# Patient Record
Sex: Male | Born: 1945
Health system: Southern US, Community
[De-identification: ages and names within clinical notes are randomized; demographics above are authoritative.]

## PROBLEM LIST (undated history)

## (undated) DIAGNOSIS — R51 Headache: Secondary | ICD-10-CM

## (undated) DIAGNOSIS — F329 Major depressive disorder, single episode, unspecified: Secondary | ICD-10-CM

## (undated) DIAGNOSIS — M1990 Unspecified osteoarthritis, unspecified site: Secondary | ICD-10-CM

## (undated) DIAGNOSIS — E119 Type 2 diabetes mellitus without complications: Secondary | ICD-10-CM

## (undated) DIAGNOSIS — C3490 Malignant neoplasm of unspecified part of unspecified bronchus or lung: Secondary | ICD-10-CM

## (undated) DIAGNOSIS — R5383 Other fatigue: Secondary | ICD-10-CM

## (undated) DIAGNOSIS — E039 Hypothyroidism, unspecified: Secondary | ICD-10-CM

## (undated) DIAGNOSIS — F32A Depression, unspecified: Secondary | ICD-10-CM

## (undated) DIAGNOSIS — R0683 Snoring: Secondary | ICD-10-CM

## (undated) DIAGNOSIS — I10 Essential (primary) hypertension: Secondary | ICD-10-CM

## (undated) DIAGNOSIS — E785 Hyperlipidemia, unspecified: Secondary | ICD-10-CM

## (undated) HISTORY — DX: Major depressive disorder, single episode, unspecified: F32.9

## (undated) HISTORY — DX: Depression, unspecified: F32.A

## (undated) HISTORY — DX: Essential (primary) hypertension: I10

## (undated) HISTORY — DX: Other fatigue: R53.83

## (undated) HISTORY — DX: Snoring: R06.83

## (undated) HISTORY — PX: KNEE ARTHROSCOPY: SHX127

## (undated) HISTORY — PX: THYROID SURGERY: SHX805

---

## 1998-01-23 ENCOUNTER — Ambulatory Visit (HOSPITAL_COMMUNITY): Admission: RE | Admit: 1998-01-23 | Discharge: 1998-01-23 | Payer: Self-pay | Admitting: Thoracic Surgery

## 1998-06-26 ENCOUNTER — Ambulatory Visit (HOSPITAL_COMMUNITY): Admission: RE | Admit: 1998-06-26 | Discharge: 1998-06-26 | Payer: Self-pay | Admitting: Thoracic Surgery

## 1999-02-28 ENCOUNTER — Encounter: Payer: Self-pay | Admitting: Thoracic Surgery

## 1999-03-01 ENCOUNTER — Encounter (INDEPENDENT_AMBULATORY_CARE_PROVIDER_SITE_OTHER): Payer: Self-pay | Admitting: *Deleted

## 1999-03-01 ENCOUNTER — Ambulatory Visit (HOSPITAL_COMMUNITY): Admission: RE | Admit: 1999-03-01 | Discharge: 1999-03-01 | Payer: Self-pay | Admitting: Thoracic Surgery

## 1999-05-22 ENCOUNTER — Encounter: Payer: Self-pay | Admitting: Thoracic Surgery

## 1999-05-22 ENCOUNTER — Encounter: Admission: RE | Admit: 1999-05-22 | Discharge: 1999-05-22 | Payer: Self-pay | Admitting: Thoracic Surgery

## 1999-12-04 ENCOUNTER — Encounter: Admission: RE | Admit: 1999-12-04 | Discharge: 1999-12-04 | Payer: Self-pay | Admitting: Thoracic Surgery

## 1999-12-04 ENCOUNTER — Encounter: Payer: Self-pay | Admitting: Thoracic Surgery

## 2000-06-09 ENCOUNTER — Encounter: Admission: RE | Admit: 2000-06-09 | Discharge: 2000-06-09 | Payer: Self-pay | Admitting: Thoracic Surgery

## 2000-06-09 ENCOUNTER — Encounter: Payer: Self-pay | Admitting: Thoracic Surgery

## 2000-12-09 ENCOUNTER — Encounter: Payer: Self-pay | Admitting: Thoracic Surgery

## 2000-12-09 ENCOUNTER — Encounter: Admission: RE | Admit: 2000-12-09 | Discharge: 2000-12-09 | Payer: Self-pay | Admitting: Thoracic Surgery

## 2001-06-29 ENCOUNTER — Other Ambulatory Visit: Admission: RE | Admit: 2001-06-29 | Discharge: 2001-06-29 | Payer: Self-pay | Admitting: General Surgery

## 2001-12-08 ENCOUNTER — Encounter: Payer: Self-pay | Admitting: Thoracic Surgery

## 2001-12-08 ENCOUNTER — Encounter: Admission: RE | Admit: 2001-12-08 | Discharge: 2001-12-08 | Payer: Self-pay | Admitting: Thoracic Surgery

## 2002-01-31 ENCOUNTER — Ambulatory Visit (HOSPITAL_COMMUNITY): Admission: RE | Admit: 2002-01-31 | Discharge: 2002-01-31 | Payer: Self-pay | Admitting: General Surgery

## 2002-06-10 ENCOUNTER — Encounter: Admission: RE | Admit: 2002-06-10 | Discharge: 2002-06-10 | Payer: Self-pay | Admitting: Thoracic Surgery

## 2002-06-10 ENCOUNTER — Encounter: Payer: Self-pay | Admitting: Thoracic Surgery

## 2002-12-08 ENCOUNTER — Encounter: Payer: Self-pay | Admitting: Thoracic Surgery

## 2002-12-08 ENCOUNTER — Encounter: Admission: RE | Admit: 2002-12-08 | Discharge: 2002-12-08 | Payer: Self-pay | Admitting: Thoracic Surgery

## 2003-01-23 ENCOUNTER — Ambulatory Visit (HOSPITAL_COMMUNITY): Admission: RE | Admit: 2003-01-23 | Discharge: 2003-01-23 | Payer: Self-pay | Admitting: Family Medicine

## 2003-01-23 ENCOUNTER — Encounter: Payer: Self-pay | Admitting: Family Medicine

## 2003-12-13 ENCOUNTER — Encounter: Admission: RE | Admit: 2003-12-13 | Discharge: 2003-12-13 | Payer: Self-pay | Admitting: Thoracic Surgery

## 2004-07-09 ENCOUNTER — Ambulatory Visit (HOSPITAL_COMMUNITY): Admission: RE | Admit: 2004-07-09 | Discharge: 2004-07-09 | Payer: Self-pay | Admitting: Orthopaedic Surgery

## 2007-02-16 ENCOUNTER — Ambulatory Visit (HOSPITAL_COMMUNITY): Admission: RE | Admit: 2007-02-16 | Discharge: 2007-02-16 | Payer: Self-pay | Admitting: Family Medicine

## 2007-03-15 ENCOUNTER — Ambulatory Visit (HOSPITAL_COMMUNITY): Admission: RE | Admit: 2007-03-15 | Discharge: 2007-03-15 | Payer: Self-pay | Admitting: Family Medicine

## 2007-03-17 ENCOUNTER — Ambulatory Visit: Payer: Self-pay | Admitting: Thoracic Surgery

## 2008-02-29 ENCOUNTER — Ambulatory Visit (HOSPITAL_COMMUNITY): Admission: RE | Admit: 2008-02-29 | Discharge: 2008-02-29 | Payer: Self-pay | Admitting: Family Medicine

## 2009-04-05 ENCOUNTER — Ambulatory Visit (HOSPITAL_COMMUNITY): Admission: RE | Admit: 2009-04-05 | Discharge: 2009-04-05 | Payer: Self-pay | Admitting: Family Medicine

## 2010-05-18 ENCOUNTER — Encounter: Payer: Self-pay | Admitting: Family Medicine

## 2010-09-10 NOTE — Letter (Signed)
March 17, 2007   Patrica Duel, M.D.  [address], [address]  [place], [place] [address]   Re:  FAUSTO, SAMPEDRO                 DOB:  12-24-1945   Dear Loraine Leriche:   I saw this patient back in today and reviewed his CT scan.  There really  is not much change in what we found several years ago with scarring in  the right upper lobe particularly along the fissures and the left  lingula.  I think this all are chronic situations that he has from  chronic scarring and we followed him for several years and did a  bronchoscopy and did not find anything.  I will refer him back to you  for long-term followup.  He did complain and then showed me a small area  in his left upper quadrant that I think is a small lipoma.  He has had  some recent bronchitis recently on his chest x-ray.  His lungs are clear  to auscultation and percussion.  Blood pressure is 117/78, pulse 86,  respirations 18, saturation 96%.   Sincerely,   Ines Bloomer, M.D.  Electronically Signed   DPB/MEDQ  D:  03/17/2007  T:  03/18/2007  Job:  [id number]

## 2010-09-13 NOTE — H&P (Signed)
   NAME:  Billy Hall, Billy Hall NO.:  [id number]   MEDICAL RECORD NO.:  [id number]                  PATIENT TYPE:   LOCATION:                                       FACILITY:   PHYSICIAN:  Dalia Heading, M.D.               DATE OF BIRTH:  08-17-1945   DATE OF ADMISSION:  DATE OF DISCHARGE:                                HISTORY & PHYSICAL   CHIEF COMPLAINT:  Need for screening colonoscopy.   HISTORY OF PRESENT ILLNESS:  The patient is a 65 year old black male who was  referred for a screening colonoscopy.  He denies any abdominal complaints.  He has never had a colonoscopy.  He denies any hemorrhoidal problems or  family history of colon carcinoma.   PAST MEDICAL HISTORY:  Hypothyroidism, gout, hypertension.   PAST SURGICAL HISTORY:  Thyroidectomy.   CURRENT MEDICATIONS:  1. Synthroid.  2. Adalat.  3. Allopurinol.   ALLERGIES:  ACE INHIBITORS.   REVIEW OF SYSTEMS:  Unremarkable.   PHYSICAL EXAMINATION:  GENERAL:  The patient is a well-developed, well-  nourished black male in no acute distress.  VITAL SIGNS:  He is afebrile and vital signs are stable.  LUNGS:  Clear to auscultation with equal breath sounds bilaterally.  HEART:  Regular rate and rhythm without S3, S4, or murmurs.  ABDOMEN:  Soft, nontender, nondistended.  No hepatosplenomegaly or masses  are noted.  RECTAL:  Examination was deferred to the procedure.   IMPRESSION:  Need for screening colonoscopy.    PLAN:  The patient is scheduled for a colonoscopy on January 31, 2002.  The  risks and benefits of the procedure including bleeding and perforation were  fully explained to the patient, gave informed consent.                                                  Dalia Heading, M.D.    MAJ/MEDQ  D:  01/27/2002  T:  01/28/2002  Job:  [id number]   cc:   Patrica Duel, MD  [address]  [place]  [place] [address]  Fax: [phone]

## 2010-09-13 NOTE — H&P (Signed)
NAME:  Billy Hall, Billy Hall           ACCOUNT NO.:  [id number]   MEDICAL RECORD NO.:  [id number]          PATIENT TYPE:  AMB   LOCATION:                                 FACILITY:   PHYSICIAN:  J. Darreld Mclean, M.D. DATE OF BIRTH:  23-Apr-1946   DATE OF ADMISSION:  07/09/2004  DATE OF DISCHARGE:  LH                                HISTORY & PHYSICAL   CHIEF COMPLAINT:  Right knee pain.   HISTORY OF PRESENT ILLNESS:  The patient is a 65 year old African American  male who has been admitted through the day hospital on July 09, 2004, to  undergo right knee arthroscopy and meniscectomy.   The patient was initially seen in the office on June 18, 2004,  complaining of right knee pain.  He gave a history of having pain to his  right knee for approximately two to three weeks.  He says that it has gotten  progressively worse.  There was no given way or locking of the knee, just  pain in the medial aspect itself.   He has seen Dr. Sherwood Gambler and was placed on Lodine 400 mg q.i.d.  This did not  seem to help with the knee pain.  Also, he was given IM Decadron injection,  which has not helped with his knee pain.  He is currently taking glucosamine  chondroitin for his knee pain.  He does have a history of gouty arthritis.  He states he does take his allopurinol faithfully.  Examination of his right  knee at that time revealed -5 to 90 degrees of flexion.  He had swelling in  the posterior aspect of the knee.  He had a negative anterior drawer sign.  No medial or collateral ligament instability noted.  He did have pain with  stressing the knee laterally along the medial joint line.   Previous x-rays taken of his knee a year ago showed significant degenerative  joint disease primarily in the medial compartment.  These were reviewed  again.  It was felt that the patient had flare up of his arthritis.  He was  then given an injection which consisted of Xylocaine, Marcaine and Depo-  Medrol and asked  to follow up in approximately two weeks.  He did so.  He  returned and stated that the injection helped for a brief period of time,  but the pain returned to the medial aspect.  He has a history of giving way  of the knee and limping.  At that time, the patient was set up to undergo an  MRI of his right knee.  He did so on June 29, 2004, and it showed an oblique  inferior surface tear of the posterior horn of the medial meniscus and  significant degenerative joint disease of the medial compartment of the  right knee.   The patient returns to the office today.  These results were explained to  him using the knee model.  Recommendations were that of right knee  arthroscopy and meniscectomy.  The surgery was described in detail per Dr.  Brooke Dare and myself.  The patient information booklet  was given.  The patient  decided to go ahead with his surgery as he was not getting any better with  conservative measures.  He understands even with the surgery that the  arthritic pain may not all be relieved.   He understands he will need a short course of physical therapy following  this procedure.   PAST MEDICAL HISTORY:  The patient has a history of:  1.  Hypertension.  2.  Gouty arthritis.  3.  Hypothyroidism.  4.  Degenerative joint disease of the right knee.   PAST SURGICAL HISTORY:  Partial thyroidectomy in 1989 by Barbaraann Barthel,  M.D., at [facility].   MEDICATIONS:  1.  Vicodin 5 mg one to two tablets q.4h. p.r.n. for pain.  2.  Aleve two tablets b.i.d. p.c.  3.  Synthroid 50 mcg one tablet daily.  4.  Adalat 30 mg one tablet daily.  5.  Allopurinol 300 mg one tablet daily.  6.  Aspirin 81 mg one tablet daily.   ALLERGIES:  ACE BLOCKERS.   LOCAL MEDICAL DOCTOR:  Patrica Duel, M.D.   SOCIAL HISTORY:  The patient is married.  He does not use alcohol or tobacco  products.  He works for [employer] in [place], [place].   FAMILY HISTORY:   Hypertension and alcoholism run in his family.   REVIEW OF SYSTEMS:  Positive for hypothyroidism, otherwise negative.   PHYSICAL EXAMINATION:  HEIGHT:  5 feet 8-1/2 inches tall.  WEIGHT:  252 pounds.  VITAL SIGNS:  Afebrile.  The pulse is 60, respirations 12 and blood pressure  130/80.  HEENT:  Within normal limits.  NECK:  Supple.  No thyromegaly or masses palpated.  LUNGS:  Clear to A&P.  HEART:  Regular rhythm without murmur.  No cardiomegaly.  ABDOMEN:  Protuberant, obese, soft and nontender.  No organomegaly or masses  palpated.  Hyperactive bowel sounds auscultated in all four quadrants.  EXTREMITIES:  Right lower extremity range of motion is -5 to 90 degrees of  flexion.  He has mild swelling in the posterior aspect of the knee and  tenderness to the medial joint line.  There is no medial or lateral  collateral ligament instability noted.  Negative anterior drawer sign noted.  Neurovascular was intact.  Other extremities within normal limits.  Neurovascular intact to them.  CENTRAL NERVOUS SYSTEM:  Intact.  SKIN:  Intact.   IMPRESSION:  1.  Torn medial meniscus of right knee.  2.  Degenerative joint disease of right knee.  3.  History of hypertension.  4.  History of hypothyroidism.  5.  History of gouty arthritis.   PLAN:  The patient is to be admitted to the day hospital on July 09, 2004,  to undergo right knee arthroscopy and meniscectomy.  Laboratories are  pending.      BC/MEDQ  D:  07/03/2004  T:  07/03/2004  Job:  [id number]   cc:   [facility]  Fax: [phone]

## 2010-09-13 NOTE — Op Note (Signed)
NAME:  Billy Hall, Billy Hall           ACCOUNT NO.:  [id number]   MEDICAL RECORD NO.:  [id number]          PATIENT TYPE:  AMB   LOCATION:  DAY                           FACILITY:  [facility]   PHYSICIAN:  J. Darreld Mclean, M.D. DATE OF BIRTH:  1945/11/30   DATE OF PROCEDURE:  07/09/2004  DATE OF DISCHARGE:                                 OPERATIVE REPORT   PREOPERATIVE DIAGNOSIS:  Tear, medial meniscus, right knee.   POSTOPERATIVE DIAGNOSIS:  Tear, medial meniscus, right knee.   PROCEDURE:  Operative arthroscopy, partial medial meniscectomy of the right  knee.   ANESTHESIA:  General.   SURGEON:  J. Darreld Mclean, M.D.   ASSISTANT:  Lolita Cram, P.A.-C.   TOURNIQUET TIME:  16 minutes.   DRAINS:  No drains.   INDICATIONS:  Patient is a 65 year old male with pain and tenderness in the  right knee. MRI shows tear of the medial meniscus and degenerative joint  disease. Surgery has been recommended because he has not improved with  conservative treatment. He has had giving way and locking. Risks and  imponderables have been discussed preoperatively.   OPERATIVE FINDINGS:  Suprapatellar pouch with some mild synovitis. Medially,  there was grade 2 to 3 changes of the femoral and articular surfaces. There  was a chronic tear of the posterior horn of the medial meniscus stellate.  Anterior cruciate was intact with some strands distally. Laterally, the  articular surface looked good, grade 2 changes. Meniscus was normal except  for some slight fraying. No loose bodies.   DESCRIPTION OF PROCEDURE:  The patient was seen in the holding area. The  right knee was identified at the correct surgical site. He placed his  initials on it, and so did I. He was taken back to the operating room, given  general anesthetic. Tourniquet and leg holder placed, deflated, right upper  leg. We reidentified the patient and we were doing the right knee for  arthroscopy of medial meniscus. After being prepped and  draped, inflow  cannula inserted medially. Lactated ringers instilled in the knee by an  infusion pump. Arthroscope was inserted laterally and knee systematically  examined. Findings above. Pictures were taken. Using a meniscal shaver and a  meniscal punch, the meniscus was smoothed, and a good contour was obtained.  There was no apparent loose bodies. Partial medial meniscectomy was carried  out. Knee was reexamined. No new pathology found. Wounds were irrigated with  remaining part of lactated ringers. Wounds were approximated using 3-0 nylon  interrupted vertical mattress manner.  Marcaine 0.25% instilled in each portal. Tourniquet deflated at 16 minutes.  Sterile dressing applied. Bulky dressing applied. He was placed in a knee  mobilizer. Prescription for Vicodin ES given for pain. I will see him in the  office in approximately 10 days to 2 weeks. For any difficulty, contact me  through the office hospital beeper system.      JWK/MEDQ  D:  07/09/2004  T:  07/09/2004  Job:  [id number]

## 2012-04-01 ENCOUNTER — Other Ambulatory Visit (HOSPITAL_COMMUNITY): Payer: Self-pay | Admitting: Family Medicine

## 2012-04-01 DIAGNOSIS — E119 Type 2 diabetes mellitus without complications: Secondary | ICD-10-CM

## 2012-04-01 DIAGNOSIS — E785 Hyperlipidemia, unspecified: Secondary | ICD-10-CM

## 2012-04-01 DIAGNOSIS — Z139 Encounter for screening, unspecified: Secondary | ICD-10-CM

## 2012-04-01 DIAGNOSIS — I10 Essential (primary) hypertension: Secondary | ICD-10-CM

## 2012-04-05 ENCOUNTER — Ambulatory Visit (HOSPITAL_COMMUNITY): Payer: Self-pay

## 2012-04-05 ENCOUNTER — Ambulatory Visit (HOSPITAL_COMMUNITY)
Admission: RE | Admit: 2012-04-05 | Discharge: 2012-04-05 | Disposition: A | Discharge status: Home / Self Care | Payer: Medicare Other | Source: Ambulatory Visit | Attending: Family Medicine | Admitting: Family Medicine

## 2012-04-05 DIAGNOSIS — I10 Essential (primary) hypertension: Secondary | ICD-10-CM

## 2012-04-05 DIAGNOSIS — Z139 Encounter for screening, unspecified: Secondary | ICD-10-CM

## 2012-04-05 DIAGNOSIS — E119 Type 2 diabetes mellitus without complications: Secondary | ICD-10-CM

## 2012-04-05 DIAGNOSIS — E785 Hyperlipidemia, unspecified: Secondary | ICD-10-CM

## 2012-04-05 DIAGNOSIS — Z1389 Encounter for screening for other disorder: Secondary | ICD-10-CM | POA: Insufficient documentation

## 2013-02-24 NOTE — H&P (Signed)
NTS SOAP Note  Vital Signs:  Vitals as of: 02/24/2013: Systolic 146: Diastolic 86: Heart Rate 81: Temp 97.18F: Height 20ft 9in: Weight 274Lbs 0 Ounces: BMI 40.46  BMI : 40.46 kg/m2  Subjective: This 67 Years 60 Months old Male presents for scheduling of screening TCS.  Denies any gi commplaints.  No family h/o colon carcinoma.  Last had a TCS over ten years ago.   Review of Symptoms:  Constitutional:  fatigue Head:unremarkable    Eyes:unremarkable   sinus problems Cardiovascular:  unremarkable   Respiratory:  dyspnea Gastrointestinal:  unremarkable   Genitourinary:    frequency   joint and back pain dry Hematolgic/Lymphatic:unremarkable       hay fever   Past Medical History:    Reviewed   Past Medical History  Surgical History: thyroidectomy Medical Problems: NIDDM, hypoithyroidism, HTN Allergies: ace inhibitors Medications: metformin, losartin, synthroid, cialis, simvastatin, tamsulosin, baby asa   Social History:Reviewed  Social History  Preferred Language: English Race:  Black or African American Ethnicity: Not Hispanic / Latino Age: 67 Years 0 Months Marital Status:  M Alcohol:  No Recreational drug(s):  No   Smoking Status: Never smoker reviewed on 02/24/2013 Functional Status reviewed on mm/dd/yyyy ------------------------------------------------ Bathing: Normal Cooking: Normal Dressing: Normal Driving: Normal Eating: Normal Managing Meds: Normal Oral Care: Normal Shopping: Normal Toileting: Normal Transferring: Normal Walking: Normal Cognitive Status reviewed on mm/dd/yyyy ------------------------------------------------ Attention: Normal Decision Making: Normal Language: Normal Memory: Normal Motor: Normal Perception: Normal Problem Solving: Normal Visual and Spatial: Normal   Family History:  Reviewed  Family Health History Mother, Deceased; Diabetes mellitus, unspecified type;  Father,  Deceased; History Unknown    Objective Information: General:  Well appearing, well nourished in no distress. Heart:  RRR, no murmur Lungs:    CTA bilaterally, no wheezes, rhonchi, rales.  Breathing unlabored. Abdomen:Soft, NT/ND, no HSM, no masses.   deferred to procedure  Assessment:Need for screening TCS  Diagnosis &amp; Procedure Smart Code   Plan:Scheduled for TCS on 03/08/13.   Patient Education:Alternative treatments to surgery were discussed with patient (and family).  Risks and benefits  of procedure including bleeding and perforatiion were fully explained to the patient (and family) who gave informed consent. Patient/family questions were addressed.  Follow-up:Pending Surgery

## 2013-02-28 ENCOUNTER — Encounter (HOSPITAL_COMMUNITY): Payer: Self-pay | Admitting: Pharmacy Technician

## 2013-03-08 ENCOUNTER — Encounter (HOSPITAL_COMMUNITY): Payer: Self-pay | Admitting: *Deleted

## 2013-03-08 ENCOUNTER — Ambulatory Visit (HOSPITAL_COMMUNITY)
Admission: RE | Admit: 2013-03-08 | Discharge: 2013-03-08 | Disposition: A | Discharge status: Home / Self Care | Payer: Medicare Other | Source: Ambulatory Visit | Attending: General Surgery | Admitting: General Surgery

## 2013-03-08 ENCOUNTER — Encounter (HOSPITAL_COMMUNITY)
Admission: RE | Disposition: A | Discharge status: Home / Self Care | Payer: Self-pay | Source: Ambulatory Visit | Attending: General Surgery

## 2013-03-08 DIAGNOSIS — Z1211 Encounter for screening for malignant neoplasm of colon: Secondary | ICD-10-CM | POA: Insufficient documentation

## 2013-03-08 DIAGNOSIS — Z01812 Encounter for preprocedural laboratory examination: Secondary | ICD-10-CM | POA: Insufficient documentation

## 2013-03-08 DIAGNOSIS — E119 Type 2 diabetes mellitus without complications: Secondary | ICD-10-CM | POA: Insufficient documentation

## 2013-03-08 DIAGNOSIS — K5730 Diverticulosis of large intestine without perforation or abscess without bleeding: Secondary | ICD-10-CM | POA: Insufficient documentation

## 2013-03-08 DIAGNOSIS — I10 Essential (primary) hypertension: Secondary | ICD-10-CM | POA: Insufficient documentation

## 2013-03-08 HISTORY — PX: COLONOSCOPY: SHX5424

## 2013-03-08 HISTORY — DX: Hyperlipidemia, unspecified: E78.5

## 2013-03-08 HISTORY — DX: Headache: R51

## 2013-03-08 HISTORY — DX: Hypothyroidism, unspecified: E03.9

## 2013-03-08 HISTORY — DX: Type 2 diabetes mellitus without complications: E11.9

## 2013-03-08 LAB — GLUCOSE, CAPILLARY: Glucose-Capillary: 93 mg/dL (ref 70–99)

## 2013-03-08 SURGERY — COLONOSCOPY
Anesthesia: Moderate Sedation

## 2013-03-08 MED ORDER — MEPERIDINE HCL 50 MG/ML IJ SOLN
INTRAMUSCULAR | Status: DC | PRN
  Administered 2013-03-08: 50 mg via INTRAVENOUS

## 2013-03-08 MED ORDER — MIDAZOLAM HCL 5 MG/5ML IJ SOLN
INTRAMUSCULAR | Status: AC
  Filled 2013-03-08: qty 5

## 2013-03-08 MED ORDER — STERILE WATER FOR IRRIGATION IR SOLN
Status: DC | PRN
  Administered 2013-03-08: 09:00:00

## 2013-03-08 MED ORDER — SODIUM CHLORIDE 0.9 % IV SOLN
INTRAVENOUS | Status: DC
  Administered 2013-03-08: 1000 mL via INTRAVENOUS

## 2013-03-08 MED ORDER — MEPERIDINE HCL 50 MG/ML IJ SOLN
INTRAMUSCULAR | Status: AC
  Filled 2013-03-08: qty 1

## 2013-03-08 MED ORDER — MIDAZOLAM HCL 5 MG/5ML IJ SOLN
INTRAMUSCULAR | Status: DC | PRN
  Administered 2013-03-08: 4 mg via INTRAVENOUS

## 2013-03-08 NOTE — Op Note (Signed)
[facility] [address] [place], [address]   COLONOSCOPY PROCEDURE REPORT  PATIENT: Billy, Hall  MR#: [id number] BIRTHDATE: [birth year] , 67  yrs. old GENDER: Male ENDOSCOPIST: [name], MD REFERRED GN:FAOZHYQ, John PROCEDURE DATE:  03/08/2013 PROCEDURE:   Colonoscopy, screening ASA CLASS:   Class II INDICATIONS:Average risk patient for colon cancer. MEDICATIONS: Versed 4 mg IV and Demerol 50 mg IV  DESCRIPTION OF PROCEDURE:   After the risks benefits and alternatives of the procedure were thoroughly explained, informed consent was obtained.  A digital rectal exam revealed no abnormalities of the rectum.   The EC-[id number] ([id number])  endoscope was introduced through the anus and advanced to the cecum, which was identified by both the appendix and ileocecal valve. No adverse events experienced.   The quality of the prep was adequate, using MoviPrep  The instrument was then slowly withdrawn as the colon was fully examined.      COLON FINDINGS: Moderate diverticulosis was noted in the descending colon.   The colon mucosa was otherwise normal.  Retroflexed views revealed no abnormalities. The time to cecum=5 minutes 0 seconds. Withdrawal time=4 minutes 0 seconds.  The scope was withdrawn and the procedure completed. COMPLICATIONS: There were no complications.  ENDOSCOPIC IMPRESSION: Normal colon  RECOMMENDATIONS: Repeat Colonscopy in 10 years.   eSigned:  [name], MD 03/08/2013 9:39 AM   cc:

## 2013-03-08 NOTE — Interval H&P Note (Signed)
History and Physical Interval Note:  03/08/2013 9:17 AM  Billy Hall  has presented today for surgery, with the diagnosis of screening  The various methods of treatment have been discussed with the patient and family. After consideration of risks, benefits and other options for treatment, the patient has consented to  Procedure(s): COLONOSCOPY (N/A) as a surgical intervention .  The patient's history has been reviewed, patient examined, no change in status, stable for surgery.  I have reviewed the patient's chart and labs.  Questions were answered to the patient's satisfaction.     [name] A

## 2013-03-11 ENCOUNTER — Encounter (HOSPITAL_COMMUNITY): Payer: Self-pay | Admitting: General Surgery

## 2014-05-08 DIAGNOSIS — Z6841 Body Mass Index (BMI) 40.0 and over, adult: Secondary | ICD-10-CM | POA: Diagnosis not present

## 2014-05-08 DIAGNOSIS — E063 Autoimmune thyroiditis: Secondary | ICD-10-CM | POA: Diagnosis not present

## 2014-06-27 DIAGNOSIS — E119 Type 2 diabetes mellitus without complications: Secondary | ICD-10-CM | POA: Diagnosis not present

## 2014-06-27 DIAGNOSIS — Z6841 Body Mass Index (BMI) 40.0 and over, adult: Secondary | ICD-10-CM | POA: Diagnosis not present

## 2014-06-27 DIAGNOSIS — I10 Essential (primary) hypertension: Secondary | ICD-10-CM | POA: Diagnosis not present

## 2014-06-27 DIAGNOSIS — E782 Mixed hyperlipidemia: Secondary | ICD-10-CM | POA: Diagnosis not present

## 2014-08-03 DIAGNOSIS — R5383 Other fatigue: Secondary | ICD-10-CM | POA: Diagnosis not present

## 2014-08-03 DIAGNOSIS — Z6838 Body mass index (BMI) 38.0-38.9, adult: Secondary | ICD-10-CM | POA: Diagnosis not present

## 2014-08-03 DIAGNOSIS — F329 Major depressive disorder, single episode, unspecified: Secondary | ICD-10-CM | POA: Diagnosis not present

## 2014-08-03 DIAGNOSIS — R0683 Snoring: Secondary | ICD-10-CM | POA: Diagnosis not present

## 2014-08-22 ENCOUNTER — Ambulatory Visit (INDEPENDENT_AMBULATORY_CARE_PROVIDER_SITE_OTHER): Payer: Medicare Other | Admitting: Neurology

## 2014-08-22 ENCOUNTER — Encounter: Payer: Self-pay | Admitting: Neurology

## 2014-08-22 VITALS — BP 142/80 | HR 88 | Resp 18 | Ht 69.0 in | Wt 258.0 lb

## 2014-08-22 DIAGNOSIS — R400 Somnolence: Secondary | ICD-10-CM

## 2014-08-22 DIAGNOSIS — Z9889 Other specified postprocedural states: Secondary | ICD-10-CM | POA: Diagnosis not present

## 2014-08-22 DIAGNOSIS — E669 Obesity, unspecified: Secondary | ICD-10-CM

## 2014-08-22 DIAGNOSIS — M25561 Pain in right knee: Secondary | ICD-10-CM | POA: Diagnosis not present

## 2014-08-22 DIAGNOSIS — G478 Other sleep disorders: Secondary | ICD-10-CM

## 2014-08-22 DIAGNOSIS — G4710 Hypersomnia, unspecified: Secondary | ICD-10-CM | POA: Diagnosis not present

## 2014-08-22 DIAGNOSIS — G4761 Periodic limb movement disorder: Secondary | ICD-10-CM

## 2014-08-22 DIAGNOSIS — M25562 Pain in left knee: Secondary | ICD-10-CM

## 2014-08-22 DIAGNOSIS — R0683 Snoring: Secondary | ICD-10-CM | POA: Diagnosis not present

## 2014-08-22 DIAGNOSIS — G4700 Insomnia, unspecified: Secondary | ICD-10-CM

## 2014-08-22 NOTE — Progress Notes (Addendum)
Subjective:    Patient ID: Billy Hall is a 69 y.o. male.  HPI     Star Age, MD, PhD [facility] [address] [place], [place] [address]  Dear Dr. Hilma Favors,   I saw your patient, Billy Hall, upon your kind request in my neurologic clinic today for initial consultation of his sleep disorder, concern for underlying obstructive sleep apnea. The patient is unaccompanied today. As you know, Billy Hall is a 69 year old right-handed gentleman with an underlying medical history of depression, hyperlipidemia, obesity, diabetes, arthritis, hypo-thyroidism, who reports excessive daytime somnolence and snoring.  I reviewed your office note from 08/03/14, which you kindly included. He had blood work, including, CBC with diff, CMP, testosterone level and TSH. We will request results from your office.   His typical bedtime is reported to be around 10:30 to 11 PM. While he falls asleep fairly quickly, he does not stay asleep while. He usually wakes up around 3:30 AM almost consistently and has trouble going back to sleep. He may doze off a little bit and finally gets out of bed around 6 or 7 AM typically. He does not wake up rested. He snores which is mild to moderate according to his wife. He does not wake up with a gasping sensation. She denies morning headaches. He may get up to use the bathroom once. He has bilateral knee pain. He had knee surgery to his anterior cruciate ligament on the right but still has pain, right more than left. He has also been told by his wife that he twitches his legs and looks like he is running in his sleep at times. He is not aware of this. He does wake up with his legs aching. He reports no family history of obstructive sleep apnea. He is not very keen on staying for sleep study because he makes sure that his wife takes her medications at night. Nevertheless, he does endorse daytime somnolence, nonrestorative sleep,  lack of energy during the day. His Epworth sleepiness score is 6 out of 24 today and his fatigue scores 23. He is retired. 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 He drinks 1 cup of coffee and one glass of green tea per day typically. He quit smoking in 2004. He does not drink alcohol. He may sleep talk some but denies any other parasomnias.    His Past Medical History Is Significant For: Past Medical History  Diagnosis Date  . Diabetes mellitus without complication   . Hyperlipidemia   . Hypothyroidism   . Headache(784.0)   . Fatigue   . Snoring   . Depressive disorder   . Hypertension     His Past Surgical History Is Significant For: Past Surgical History  Procedure Laterality Date  . Thyroid surgery    . Colonoscopy N/A 03/08/2013    Procedure: COLONOSCOPY;  Surgeon: Jamesetta So, MD;  Location: [facility];  Service: Gastroenterology;  Laterality: N/A;    His Family History Is Significant For: Family History  Problem Relation Age of Onset  . Alzheimer's disease Mother     His Social History Is Significant For: History   Social History  . Marital Status: Married    Spouse Name: N/A  . Number of Children: 1  . Years of Education: High Schol   Occupational History  . Retired    Social History Main Topics  . Smoking status: Former Research scientist (life sciences)  . Smokeless tobacco: Not on file  Comment: Quit 2004  . Alcohol Use: 0.0 oz/week    0 Standard drinks or equivalent per week     Comment: Wekends  . Drug Use: No  . Sexual Activity: Not on file   Other Topics Concern  . None   Social History Narrative   I cup of coffee a day, drinks some Green tea    His Allergies Are:  Allergies  Allergen Reactions  . Ace Inhibitors   :   His Current Medications Are:  Outpatient Encounter Prescriptions as of 08/22/2014  Medication Sig  . aspirin EC 81 MG tablet Take 81 mg by mouth daily.  Marland Kitchen escitalopram (LEXAPRO) 10 MG tablet Take 10 mg by mouth daily.  Marland Kitchen  levothyroxine (SYNTHROID, LEVOTHROID) 75 MCG tablet Take 75 mcg by mouth daily before breakfast.  . losartan (COZAAR) 100 MG tablet Take 50 mg by mouth daily.  . meloxicam (MOBIC) 15 MG tablet Take 15 mg by mouth daily.  . metFORMIN (GLUCOPHAGE) 1000 MG tablet Take 1,000 mg by mouth 2 (two) times daily with a meal.  . simvastatin (ZOCOR) 40 MG tablet Take 20 mg by mouth every evening.  . tadalafil (CIALIS) 5 MG tablet Take 10 mg by mouth daily as needed for erectile dysfunction.  . tamsulosin (FLOMAX) 0.4 MG CAPS capsule Take 0.4 mg by mouth daily.  :  Review of Systems:  Out of a complete 14 point review of systems, all are reviewed and negative with the exception of these symptoms as listed below:   Review of Systems  Constitutional: Positive for fatigue.  Musculoskeletal:       Joint pain   Neurological:       Sleepiness, Reports having trouble staying asleep at night, Family reports snoring, Wakes up feeling tired in the morning.   Psychiatric/Behavioral:       Decreased energy     Objective:  Neurologic Exam  Physical Exam Physical Examination:   Filed Vitals:   08/22/14 0937  BP: 142/80  Pulse: 88  Resp: 18   General Examination: The patient is a very pleasant 69 y.o. male in no acute distress. He appears well-developed and well nourished. He is well groomed.   HEENT: Normocephalic, atraumatic, pupils are equal, round and reactive to light and accommodation. Funduscopic exam is normal with sharp disc margins noted. Extraocular tracking is good without limitation to gaze excursion or nystagmus noted. Normal smooth pursuit is noted. Hearing is grossly intact. Tympanic membranes are clear bilaterally. Face is symmetric with normal facial animation and normal facial sensation. Speech is clear with no dysarthria noted. There is no hypophonia. There is no lip, neck/head, jaw or voice tremor. Neck is supple with full range of passive and active motion. There are no carotid bruits  on auscultation. Oropharynx exam reveals: mild mouth dryness, adequate dental hygiene and moderate airway crowding, due to redundant soft palate with a wider uvula. His tonsils may be absent or small. Tongue is elongated. Mallampati is class II. Tongue protrudes centrally and palate elevates symmetrically. Neck size is 16.75 inches. He has dental implants in the front teeth above and below. He has some crowns.  Chest: Clear to auscultation without wheezing, rhonchi or crackles noted.  Heart: S1+S2+0, regular and normal without murmurs, rubs or gallops noted.   Abdomen: Soft, non-tender and non-distended with normal bowel sounds appreciated on auscultation.  Extremities: There is no pitting edema. He has bilateral nonpitting ankle puffiness. Pedal pulses are intact.  Skin: Warm and dry without trophic changes  noted. There are no varicose veins.  Musculoskeletal: exam reveals no obvious joint deformities, tenderness or joint swelling or erythema, except for bilateral knee pain and difficulty bending both knees. He has decreased range of motion bilaterally in the knees, right is worse than left.   Neurologically:  Mental status: The patient is awake, alert and oriented in all 4 spheres. His immediate and remote memory, attention, language skills and fund of knowledge are appropriate. There is no evidence of aphasia, agnosia, apraxia or anomia. Speech is clear with normal prosody and enunciation. Thought process is linear. Mood is normal and affect is normal.  Cranial nerves II - XII are as described above under HEENT exam. In addition: shoulder shrug is normal with equal shoulder height noted. Motor exam: Normal bulk, strength and tone is noted. There is no drift, tremor or rebound. Romberg is negative. Reflexes are 2+ throughout. Babinski: Toes are flexor bilaterally. Fine motor skills and coordination: intact with normal finger taps, normal hand movements, normal rapid alternating patting, normal  foot taps and normal foot agility.  Cerebellar testing: No dysmetria or intention tremor on finger to nose testing. Heel to shin is unremarkable bilaterally. There is no truncal or gait ataxia.  Sensory exam: intact to light touch, pinprick, vibration, temperature sense in the upper and lower extremities.  Gait, station and balance: He stands with difficulty, due to limitations in knee flexion bilaterally, right more than left. No veering to one side is noted. No leaning to one side is noted. Posture is age-appropriate and stance is narrow based. Gait shows slight limp on the right. He has trouble with tandem walk as he has difficulty putting weight on one leg at a time. No problems turning are noted. Balance seems preserved.   Assessment and Plan:  In summary, Billy Hall is a very pleasant 69 y.o.-year old male with an underlying medical history of depression, hyperlipidemia, obesity, diabetes, arthritis, hypo-thyroidism, who reports  snoring, nonrestorative sleep, early morning awakening with difficulty maintaining sleep and daytime tiredness. His history and physical exam are concerning for obstructive sleep apnea (OSA). He also reports bilateral knee pain. He reports twitching or vomiting movements in his sleep which could be in keeping with PLMS. I had a long chat with the patient about my findings and the diagnosis of OSA, its prognosis and treatment options. We talked about medical treatments, surgical interventions and non-pharmacological approaches. I explained in particular the risks and ramifications of untreated moderate to severe OSA, especially with respect to developing cardiovascular disease down the Road, including congestive heart failure, difficult to treat hypertension, cardiac arrhythmias, or stroke. Even type 2 diabetes has, in part, been linked to untreated OSA. Symptoms of untreated OSA include daytime sleepiness, memory problems, mood irritability and mood disorder such as  depression and anxiety, lack of energy, as well as recurrent headaches, especially morning headaches. We talked about trying to maintain a healthy lifestyle in general, as well as the importance of weight control. I encouraged the patient to eat healthy, exercise daily and keep well hydrated, to keep a scheduled bedtime and wake time routine, to not skip any meals and eat healthy snacks in between meals. I advised the patient not to drive when feeling sleepy. I recommended the following at this time: sleep study with potential positive airway pressure titration. (We will score hypopneas at 4% and split the sleep study into diagnostic and treatment portion, if the estimated. 2 hour AHI is >20/h).   I explained the sleep test  procedure to the patient and also outlined possible surgical and non-surgical treatment options of OSA, including the use of a custom-made dental device (which would require a referral to a specialist dentist or oral surgeon), upper airway surgical options, such as pillar implants, radiofrequency surgery, tongue base surgery, and UPPP (which would involve a referral to an ENT surgeon). Rarely, jaw surgery such as mandibular advancement may be considered.  I also explained the CPAP treatment option to the patient, who indicated that he would be willing to try CPAP if the need arises. I explained the importance of being compliant with PAP treatment, not only for insurance purposes but primarily to improve His symptoms, and for the patient's long term health benefit, including to reduce His cardiovascular risks. I answered all his questions today and the patient was in agreement. I would like to see him back after the sleep study is completed and encouraged him to call with any interim questions, concerns, problems or updates.   We also talked about PLMD and how it is linked to restless leg syndrome. We will revisit this after the sleep study. If he does have significant PLMD, disrupting his  sleep he may have to address this as well.  Thank you very much for allowing me to participate in the care of this nice patient. If I can be of any further assistance to you please do not hesitate to call me at [phone].  Sincerely,   Star Age, MD, PhD  08/22/2014: I received blood test results from 08/03/2014: CBC with differential was normal, CMP was normal with the exception of glucose at 125, testosterone in the normal range at 471, TSH normal at 2.98.

## 2014-08-22 NOTE — Patient Instructions (Signed)

## 2014-08-25 DIAGNOSIS — E063 Autoimmune thyroiditis: Secondary | ICD-10-CM | POA: Diagnosis not present

## 2014-08-25 DIAGNOSIS — Z6837 Body mass index (BMI) 37.0-37.9, adult: Secondary | ICD-10-CM | POA: Diagnosis not present

## 2014-08-27 ENCOUNTER — Ambulatory Visit (INDEPENDENT_AMBULATORY_CARE_PROVIDER_SITE_OTHER): Payer: Medicare Other | Admitting: Neurology

## 2014-08-27 DIAGNOSIS — G4710 Hypersomnia, unspecified: Secondary | ICD-10-CM

## 2014-08-27 DIAGNOSIS — G4733 Obstructive sleep apnea (adult) (pediatric): Secondary | ICD-10-CM

## 2014-08-27 DIAGNOSIS — G4734 Idiopathic sleep related nonobstructive alveolar hypoventilation: Secondary | ICD-10-CM

## 2014-08-27 DIAGNOSIS — G479 Sleep disorder, unspecified: Secondary | ICD-10-CM

## 2014-08-27 DIAGNOSIS — G4761 Periodic limb movement disorder: Secondary | ICD-10-CM

## 2014-08-27 NOTE — Sleep Study (Signed)
Please see the scanned sleep study interpretation located in the Procedure tab within the Chart Review section. 

## 2014-09-08 ENCOUNTER — Telehealth: Payer: Self-pay | Admitting: Neurology

## 2014-09-08 DIAGNOSIS — G4733 Obstructive sleep apnea (adult) (pediatric): Secondary | ICD-10-CM

## 2014-09-08 NOTE — Telephone Encounter (Signed)
Beverlee Nims:   Please call and notify the patient that the recent sleep study did confirm the diagnosis of obstructive sleep apnea and that I recommend treatment for this in the form of CPAP. This will require a repeat sleep study for proper titration and mask fitting. Please explain to patient and then Alycia can arrange for a CPAP titration study. I have placed an order in the chart. Thanks, Star Age, MD, PhD [facility])

## 2014-09-13 NOTE — Telephone Encounter (Signed)
I spoke to patient and gave him the results of his sleep study. He states that he does not want to proceed and he does not want an office visit to go over the PSG in more detail. I went over the PSG findings in detail with him over the phone. Patient was made aware of risks and benefits of CPAP treatment. He states that he understands but insists that he "is not having anymore sleeping trouble" and is "sleeping real good now". I again explained the findings of his oxygen desaturation and degree of apnea events. I offered a follow up appointment with Dr. Rexene Alberts several times but he continued to decline. He reports that he will call us back as needed. Sleep study was faxed to PCP.

## 2014-09-13 NOTE — Telephone Encounter (Signed)
I will try to call patient. I would like for him to come to an appt for FU at least.

## 2014-09-15 NOTE — Telephone Encounter (Signed)
Ok

## 2014-10-02 DIAGNOSIS — Z6838 Body mass index (BMI) 38.0-38.9, adult: Secondary | ICD-10-CM | POA: Diagnosis not present

## 2014-10-02 DIAGNOSIS — Z0000 Encounter for general adult medical examination without abnormal findings: Secondary | ICD-10-CM | POA: Diagnosis not present

## 2014-11-07 ENCOUNTER — Encounter: Payer: Self-pay | Admitting: Orthopedic Surgery

## 2014-11-07 ENCOUNTER — Other Ambulatory Visit: Payer: Self-pay | Admitting: Orthopedic Surgery

## 2014-11-07 ENCOUNTER — Ambulatory Visit (HOSPITAL_COMMUNITY)
Admission: RE | Admit: 2014-11-07 | Discharge: 2014-11-07 | Disposition: A | Discharge status: Home / Self Care | Payer: Medicare Other | Source: Ambulatory Visit | Attending: Orthopedic Surgery | Admitting: Orthopedic Surgery

## 2014-11-07 ENCOUNTER — Ambulatory Visit (INDEPENDENT_AMBULATORY_CARE_PROVIDER_SITE_OTHER): Payer: Medicare Other | Admitting: Orthopedic Surgery

## 2014-11-07 VITALS — BP 142/80 | Ht 69.0 in | Wt 269.0 lb

## 2014-11-07 DIAGNOSIS — M1711 Unilateral primary osteoarthritis, right knee: Secondary | ICD-10-CM | POA: Diagnosis not present

## 2014-11-07 DIAGNOSIS — M47816 Spondylosis without myelopathy or radiculopathy, lumbar region: Secondary | ICD-10-CM | POA: Diagnosis not present

## 2014-11-07 DIAGNOSIS — M1712 Unilateral primary osteoarthritis, left knee: Secondary | ICD-10-CM | POA: Diagnosis not present

## 2014-11-07 DIAGNOSIS — M25562 Pain in left knee: Secondary | ICD-10-CM

## 2014-11-07 DIAGNOSIS — M545 Low back pain: Secondary | ICD-10-CM | POA: Insufficient documentation

## 2014-11-07 DIAGNOSIS — M25561 Pain in right knee: Secondary | ICD-10-CM | POA: Diagnosis not present

## 2014-11-07 DIAGNOSIS — M549 Dorsalgia, unspecified: Secondary | ICD-10-CM

## 2014-11-07 NOTE — Patient Instructions (Signed)
Joint Injection  Care After  Refer to this sheet in the next few days. These instructions provide you with information on caring for yourself after you have had a joint injection. Your caregiver also may give you more specific instructions. Your treatment has been planned according to current medical practices, but problems sometimes occur. Call your caregiver if you have any problems or questions after your procedure.  After any type of joint injection, it is not uncommon to experience:  · Soreness, swelling, or bruising around the injection site.  · Mild numbness, tingling, or weakness around the injection site caused by the numbing medicine used before or with the injection.  It also is possible to experience the following effects associated with the specific agent after injection:  · Iodine-based contrast agents:  ¨ Allergic reaction (itching, hives, widespread redness, and swelling beyond the injection site).  · Corticosteroids (These effects are rare.):  ¨ Allergic reaction.  ¨ Increased blood sugar levels (If you have diabetes and you notice that your blood sugar levels have increased, notify your caregiver).  ¨ Increased blood pressure levels.  ¨ Mood swings.  · Hyaluronic acid in the use of viscosupplementation.  ¨ Temporary heat or redness.  ¨ Temporary rash and itching.  ¨ Increased fluid accumulation in the injected joint.  These effects all should resolve within a day after your procedure.   HOME CARE INSTRUCTIONS  · Limit yourself to light activity the day of your procedure. Avoid lifting heavy objects, bending, stooping, or twisting.  · Take prescription or over-the-counter pain medication as directed by your caregiver.  · You may apply ice to your injection site to reduce pain and swelling the day of your procedure. Ice may be applied 03-04 times:  ¨ Put ice in a plastic bag.  ¨ Place a towel between your skin and the bag.  ¨ Leave the ice on for no longer than 15-20 minutes each time.  SEEK  IMMEDIATE MEDICAL CARE IF:   · Pain and swelling get worse rather than better or extend beyond the injection site.  · Numbness does not go away.  · Blood or fluid continues to leak from the injection site.  · You have chest pain.  · You have swelling of your face or tongue.  · You have trouble breathing or you become dizzy.  · You develop a fever, chills, or severe tenderness at the injection site that last longer than 1 day.  MAKE SURE YOU:  · Understand these instructions.  · Watch your condition.  · Get help right away if you are not doing well or if you get worse.  Document Released: 12/26/2010 Document Revised: 07/07/2011 Document Reviewed: 12/26/2010  ExitCare® Patient Information ©2015 [employer]. This information is not intended to replace advice given to you by your health care provider. Make sure you discuss any questions you have with your health care provider.

## 2014-11-07 NOTE — Progress Notes (Signed)
Body mass index is 39.71 kg/(m^2).

## 2014-11-07 NOTE — Progress Notes (Signed)
Patient ID: Billy Hall, male   DOB: [birth year], 69 y.o.   MRN: [id number] Patient ID: Billy Hall, male   DOB: 03-Apr-1946, 69 y.o.   MRN: [id number] New   Chief Complaint  Patient presents with  . Knee Pain    bilateral knee pain, refer Hilma Favors     Billy Hall is a 69 y.o. male.   HPI 69 year old male has a 10 year history of pain in both knees right worse than left had right knee arthroscopy back in 2006 presents with pain swelling stiffness in both knees right worse than left. He also has pain from his hip to his ankle on the right side and back pain. He was treated with meloxicam on a when necessary basis but says that doesn't help him as much as Tylenol 650 area his pain is rated 7 out of 10 is worse with activity is worse at night is primarily on the medial side of both knees and its increased when he is walking. He does have some fairly significant medical problems which include diabetes and hypertension as well as thyroid disease but they are fairly well controlled with medicine  Night sweats fatigue shortness of breath cold and heat intolerance hayfever burning pain in his legs and tingling make up his review of systems along with his back pain stiff joints swollen joints muscle weakness and joint pain   Review of Systems See hpi  Past Medical History  Diagnosis Date  . Diabetes mellitus without complication   . Hyperlipidemia   . Hypothyroidism   . Headache(784.0)   . Fatigue   . Snoring   . Depressive disorder   . Hypertension     Past Surgical History  Procedure Laterality Date  . Thyroid surgery    . Colonoscopy N/A 03/08/2013    Procedure: COLONOSCOPY;  Surgeon: Jamesetta So, MD;  [facility]: [facility];  Service: Gastroenterology;  Laterality: N/A;    Family History  Problem Relation Age of Onset  . Alzheimer's disease Mother     Social History History  Substance Use Topics  . Smoking status: Former Research scientist (life sciences)  . Smokeless tobacco:  Not on file     Comment: Quit 2004  . Alcohol Use: 0.0 oz/week    0 Standard drinks or equivalent per week     Comment: Wekends    Allergies  Allergen Reactions  . Ace Inhibitors     Current Outpatient Prescriptions  Medication Sig Dispense Refill  . aspirin EC 81 MG tablet Take 81 mg by mouth daily.    Marland Kitchen levothyroxine (SYNTHROID, LEVOTHROID) 75 MCG tablet Take 75 mcg by mouth daily before breakfast.    . losartan (COZAAR) 100 MG tablet Take 50 mg by mouth daily.    . meloxicam (MOBIC) 15 MG tablet Take 15 mg by mouth daily.    . metFORMIN (GLUCOPHAGE) 1000 MG tablet Take 1,000 mg by mouth 2 (two) times daily with a meal.    . simvastatin (ZOCOR) 40 MG tablet Take 20 mg by mouth every evening.    . tamsulosin (FLOMAX) 0.4 MG CAPS capsule Take 0.4 mg by mouth daily.     No current facility-administered medications for this visit.       Physical Exam Blood pressure 142/80, height '5\' 9"'$  (1.753 m), weight 269 lb (122.018 kg). Physical Exam The patient is well developed well nourished and well groomed. Orientation to person place and time is normal  Mood is pleasant. Ambulatory status he is ambulatory  there is no assistive device at this time. Right knee flexion is limited at 90 if that. He has flexion contracture varus alignment but his knee is stable motor exam is normal scans intact sensations normal he has weak pulses on the dorsum of the feet. I would recommend an ABI prior to surgery  Left side same in terms of vascular exam  Sensation skin intact on the left motor exam intact in the left knee stable on the left knee flexion 110 on left medial joint line tenderness is still significant no effusion.    Data Reviewed X-rays were done but they had to be done at [facility] back film and bilateral knee films  Spondylosis lumbar spine  Severe arthritis bilateral knees  Assessment Encounter Diagnoses  Name Primary?  . Back pain with radiation Yes  . Primary osteoarthritis of  knee, right     Plan He basically needs bilateral knee replacements. His wife is requiring his full care and he cannot have the surgery. He asked about hyaluronic acid but in these settings that is not a productive option although if he wants that done I can send him to another doctor to get it as the [facility] system does not cover that with what Medicare reimbursement is  So we decided to inject both knees and he will call us if he needs further care.  Procedure note left knee injection verbal consent was obtained to inject left knee joint  Timeout was completed to confirm the site of injection  The medications used were 40 mg of Depo-Medrol and 1% lidocaine 3 cc  Anesthesia was provided by ethyl chloride and the skin was prepped with alcohol.  After cleaning the skin with alcohol a 20-gauge needle was used to inject the left knee joint. There were no complications. A sterile bandage was applied.   Procedure note right knee injection verbal consent was obtained to inject right knee joint  Timeout was completed to confirm the site of injection  The medications used were 40 mg of Depo-Medrol and 1% lidocaine 3 cc  Anesthesia was provided by ethyl chloride and the skin was prepped with alcohol.  After cleaning the skin with alcohol a 20-gauge needle was used to inject the right knee joint. There were no complications. A sterile bandage was applied.

## 2014-11-08 ENCOUNTER — Telehealth: Payer: Self-pay | Admitting: Orthopedic Surgery

## 2014-11-08 NOTE — Telephone Encounter (Signed)
Mr. Dispenza is calling stating that no one ever went over the xrays that were done at [facility] yesterday, he said he was just told that his knees were "shot" and he would like for someone to call him and explain to him those results, please advise?

## 2014-11-09 NOTE — Telephone Encounter (Signed)
Spoke with patient, gave results  

## 2014-12-06 DIAGNOSIS — E782 Mixed hyperlipidemia: Secondary | ICD-10-CM | POA: Diagnosis not present

## 2014-12-06 DIAGNOSIS — Z1389 Encounter for screening for other disorder: Secondary | ICD-10-CM | POA: Diagnosis not present

## 2014-12-06 DIAGNOSIS — E119 Type 2 diabetes mellitus without complications: Secondary | ICD-10-CM | POA: Diagnosis not present

## 2014-12-06 DIAGNOSIS — I10 Essential (primary) hypertension: Secondary | ICD-10-CM | POA: Diagnosis not present

## 2014-12-06 DIAGNOSIS — E039 Hypothyroidism, unspecified: Secondary | ICD-10-CM | POA: Diagnosis not present

## 2014-12-18 DIAGNOSIS — E782 Mixed hyperlipidemia: Secondary | ICD-10-CM | POA: Diagnosis not present

## 2014-12-18 DIAGNOSIS — Z6841 Body Mass Index (BMI) 40.0 and over, adult: Secondary | ICD-10-CM | POA: Diagnosis not present

## 2014-12-18 DIAGNOSIS — E119 Type 2 diabetes mellitus without complications: Secondary | ICD-10-CM | POA: Diagnosis not present

## 2014-12-18 DIAGNOSIS — Z1389 Encounter for screening for other disorder: Secondary | ICD-10-CM | POA: Diagnosis not present

## 2015-01-03 DIAGNOSIS — M170 Bilateral primary osteoarthritis of knee: Secondary | ICD-10-CM | POA: Diagnosis not present

## 2015-01-15 DIAGNOSIS — H43819 Vitreous degeneration, unspecified eye: Secondary | ICD-10-CM | POA: Diagnosis not present

## 2015-01-15 DIAGNOSIS — E119 Type 2 diabetes mellitus without complications: Secondary | ICD-10-CM | POA: Diagnosis not present

## 2015-01-15 DIAGNOSIS — H52223 Regular astigmatism, bilateral: Secondary | ICD-10-CM | POA: Diagnosis not present

## 2015-01-15 DIAGNOSIS — H524 Presbyopia: Secondary | ICD-10-CM | POA: Diagnosis not present

## 2015-01-15 DIAGNOSIS — H5203 Hypermetropia, bilateral: Secondary | ICD-10-CM | POA: Diagnosis not present

## 2015-01-17 DIAGNOSIS — M1712 Unilateral primary osteoarthritis, left knee: Secondary | ICD-10-CM | POA: Diagnosis not present

## 2015-01-17 DIAGNOSIS — M1711 Unilateral primary osteoarthritis, right knee: Secondary | ICD-10-CM | POA: Diagnosis not present

## 2015-01-24 DIAGNOSIS — M170 Bilateral primary osteoarthritis of knee: Secondary | ICD-10-CM | POA: Diagnosis not present

## 2015-01-24 DIAGNOSIS — M1711 Unilateral primary osteoarthritis, right knee: Secondary | ICD-10-CM | POA: Diagnosis not present

## 2015-01-24 DIAGNOSIS — M1712 Unilateral primary osteoarthritis, left knee: Secondary | ICD-10-CM | POA: Diagnosis not present

## 2015-01-31 DIAGNOSIS — M1711 Unilateral primary osteoarthritis, right knee: Secondary | ICD-10-CM | POA: Diagnosis not present

## 2015-01-31 DIAGNOSIS — M170 Bilateral primary osteoarthritis of knee: Secondary | ICD-10-CM | POA: Diagnosis not present

## 2015-01-31 DIAGNOSIS — M1712 Unilateral primary osteoarthritis, left knee: Secondary | ICD-10-CM | POA: Diagnosis not present

## 2015-03-09 DIAGNOSIS — Z1389 Encounter for screening for other disorder: Secondary | ICD-10-CM | POA: Diagnosis not present

## 2015-03-09 DIAGNOSIS — I10 Essential (primary) hypertension: Secondary | ICD-10-CM | POA: Diagnosis not present

## 2015-03-09 DIAGNOSIS — E119 Type 2 diabetes mellitus without complications: Secondary | ICD-10-CM | POA: Diagnosis not present

## 2015-03-09 DIAGNOSIS — E782 Mixed hyperlipidemia: Secondary | ICD-10-CM | POA: Diagnosis not present

## 2015-03-09 DIAGNOSIS — Z6841 Body Mass Index (BMI) 40.0 and over, adult: Secondary | ICD-10-CM | POA: Diagnosis not present

## 2015-03-09 DIAGNOSIS — Z23 Encounter for immunization: Secondary | ICD-10-CM | POA: Diagnosis not present

## 2015-03-14 DIAGNOSIS — M170 Bilateral primary osteoarthritis of knee: Secondary | ICD-10-CM | POA: Diagnosis not present

## 2015-07-12 DIAGNOSIS — E119 Type 2 diabetes mellitus without complications: Secondary | ICD-10-CM | POA: Diagnosis not present

## 2015-07-12 DIAGNOSIS — E039 Hypothyroidism, unspecified: Secondary | ICD-10-CM | POA: Diagnosis not present

## 2015-07-12 DIAGNOSIS — Z1389 Encounter for screening for other disorder: Secondary | ICD-10-CM | POA: Diagnosis not present

## 2015-07-12 DIAGNOSIS — I10 Essential (primary) hypertension: Secondary | ICD-10-CM | POA: Diagnosis not present

## 2015-08-31 DIAGNOSIS — M1711 Unilateral primary osteoarthritis, right knee: Secondary | ICD-10-CM | POA: Diagnosis not present

## 2015-08-31 DIAGNOSIS — M1712 Unilateral primary osteoarthritis, left knee: Secondary | ICD-10-CM | POA: Diagnosis not present

## 2015-08-31 DIAGNOSIS — M170 Bilateral primary osteoarthritis of knee: Secondary | ICD-10-CM | POA: Diagnosis not present

## 2015-10-15 DIAGNOSIS — E782 Mixed hyperlipidemia: Secondary | ICD-10-CM | POA: Diagnosis not present

## 2015-10-15 DIAGNOSIS — Z1389 Encounter for screening for other disorder: Secondary | ICD-10-CM | POA: Diagnosis not present

## 2015-10-15 DIAGNOSIS — I10 Essential (primary) hypertension: Secondary | ICD-10-CM | POA: Diagnosis not present

## 2015-10-15 DIAGNOSIS — E1165 Type 2 diabetes mellitus with hyperglycemia: Secondary | ICD-10-CM | POA: Diagnosis not present

## 2016-01-16 DIAGNOSIS — H524 Presbyopia: Secondary | ICD-10-CM | POA: Diagnosis not present

## 2016-01-16 DIAGNOSIS — H5203 Hypermetropia, bilateral: Secondary | ICD-10-CM | POA: Diagnosis not present

## 2016-01-16 DIAGNOSIS — E119 Type 2 diabetes mellitus without complications: Secondary | ICD-10-CM | POA: Diagnosis not present

## 2016-01-16 DIAGNOSIS — H52223 Regular astigmatism, bilateral: Secondary | ICD-10-CM | POA: Diagnosis not present

## 2016-02-07 DIAGNOSIS — E119 Type 2 diabetes mellitus without complications: Secondary | ICD-10-CM | POA: Diagnosis not present

## 2016-02-07 DIAGNOSIS — I10 Essential (primary) hypertension: Secondary | ICD-10-CM | POA: Diagnosis not present

## 2016-02-07 DIAGNOSIS — E782 Mixed hyperlipidemia: Secondary | ICD-10-CM | POA: Diagnosis not present

## 2016-02-07 DIAGNOSIS — Z0001 Encounter for general adult medical examination with abnormal findings: Secondary | ICD-10-CM | POA: Diagnosis not present

## 2016-02-07 DIAGNOSIS — R5383 Other fatigue: Secondary | ICD-10-CM | POA: Diagnosis not present

## 2016-02-07 DIAGNOSIS — J449 Chronic obstructive pulmonary disease, unspecified: Secondary | ICD-10-CM | POA: Diagnosis not present

## 2016-02-08 DIAGNOSIS — N400 Enlarged prostate without lower urinary tract symptoms: Secondary | ICD-10-CM | POA: Diagnosis not present

## 2016-02-08 DIAGNOSIS — Z1389 Encounter for screening for other disorder: Secondary | ICD-10-CM | POA: Diagnosis not present

## 2016-02-08 DIAGNOSIS — Z0001 Encounter for general adult medical examination with abnormal findings: Secondary | ICD-10-CM | POA: Diagnosis not present

## 2016-02-08 DIAGNOSIS — J439 Emphysema, unspecified: Secondary | ICD-10-CM | POA: Diagnosis not present

## 2016-02-12 DIAGNOSIS — Z23 Encounter for immunization: Secondary | ICD-10-CM | POA: Diagnosis not present

## 2016-06-09 DIAGNOSIS — Z1389 Encounter for screening for other disorder: Secondary | ICD-10-CM | POA: Diagnosis not present

## 2016-06-09 DIAGNOSIS — Z0000 Encounter for general adult medical examination without abnormal findings: Secondary | ICD-10-CM | POA: Diagnosis not present

## 2016-06-09 DIAGNOSIS — J439 Emphysema, unspecified: Secondary | ICD-10-CM | POA: Diagnosis not present

## 2016-06-09 DIAGNOSIS — E782 Mixed hyperlipidemia: Secondary | ICD-10-CM | POA: Diagnosis not present

## 2016-06-09 DIAGNOSIS — E119 Type 2 diabetes mellitus without complications: Secondary | ICD-10-CM | POA: Diagnosis not present

## 2016-06-09 DIAGNOSIS — E039 Hypothyroidism, unspecified: Secondary | ICD-10-CM | POA: Diagnosis not present

## 2016-06-09 DIAGNOSIS — N400 Enlarged prostate without lower urinary tract symptoms: Secondary | ICD-10-CM | POA: Diagnosis not present

## 2016-06-09 DIAGNOSIS — J449 Chronic obstructive pulmonary disease, unspecified: Secondary | ICD-10-CM | POA: Diagnosis not present

## 2016-09-16 DIAGNOSIS — I10 Essential (primary) hypertension: Secondary | ICD-10-CM | POA: Diagnosis not present

## 2016-09-16 DIAGNOSIS — E1165 Type 2 diabetes mellitus with hyperglycemia: Secondary | ICD-10-CM | POA: Diagnosis not present

## 2016-09-16 DIAGNOSIS — E782 Mixed hyperlipidemia: Secondary | ICD-10-CM | POA: Diagnosis not present

## 2016-09-16 DIAGNOSIS — Z1389 Encounter for screening for other disorder: Secondary | ICD-10-CM | POA: Diagnosis not present

## 2016-09-16 DIAGNOSIS — E039 Hypothyroidism, unspecified: Secondary | ICD-10-CM | POA: Diagnosis not present

## 2016-09-16 DIAGNOSIS — J449 Chronic obstructive pulmonary disease, unspecified: Secondary | ICD-10-CM | POA: Diagnosis not present

## 2016-10-23 DIAGNOSIS — N481 Balanitis: Secondary | ICD-10-CM | POA: Diagnosis not present

## 2016-10-23 DIAGNOSIS — E119 Type 2 diabetes mellitus without complications: Secondary | ICD-10-CM | POA: Diagnosis not present

## 2016-10-23 DIAGNOSIS — E063 Autoimmune thyroiditis: Secondary | ICD-10-CM | POA: Diagnosis not present

## 2017-01-19 DIAGNOSIS — R5383 Other fatigue: Secondary | ICD-10-CM | POA: Diagnosis not present

## 2017-01-19 DIAGNOSIS — E063 Autoimmune thyroiditis: Secondary | ICD-10-CM | POA: Diagnosis not present

## 2017-01-19 DIAGNOSIS — Z23 Encounter for immunization: Secondary | ICD-10-CM | POA: Diagnosis not present

## 2017-01-19 DIAGNOSIS — E782 Mixed hyperlipidemia: Secondary | ICD-10-CM | POA: Diagnosis not present

## 2017-01-19 DIAGNOSIS — I10 Essential (primary) hypertension: Secondary | ICD-10-CM | POA: Diagnosis not present

## 2017-01-19 DIAGNOSIS — N400 Enlarged prostate without lower urinary tract symptoms: Secondary | ICD-10-CM | POA: Diagnosis not present

## 2017-01-19 DIAGNOSIS — E119 Type 2 diabetes mellitus without complications: Secondary | ICD-10-CM | POA: Diagnosis not present

## 2017-01-22 DIAGNOSIS — E119 Type 2 diabetes mellitus without complications: Secondary | ICD-10-CM | POA: Diagnosis not present

## 2017-01-22 DIAGNOSIS — E1165 Type 2 diabetes mellitus with hyperglycemia: Secondary | ICD-10-CM | POA: Diagnosis not present

## 2017-05-05 DIAGNOSIS — E039 Hypothyroidism, unspecified: Secondary | ICD-10-CM | POA: Diagnosis not present

## 2017-05-05 DIAGNOSIS — E782 Mixed hyperlipidemia: Secondary | ICD-10-CM | POA: Diagnosis not present

## 2017-05-05 DIAGNOSIS — E119 Type 2 diabetes mellitus without complications: Secondary | ICD-10-CM | POA: Diagnosis not present

## 2017-05-05 DIAGNOSIS — J439 Emphysema, unspecified: Secondary | ICD-10-CM | POA: Diagnosis not present

## 2017-05-05 DIAGNOSIS — I10 Essential (primary) hypertension: Secondary | ICD-10-CM | POA: Diagnosis not present

## 2017-05-05 DIAGNOSIS — Z1389 Encounter for screening for other disorder: Secondary | ICD-10-CM | POA: Diagnosis not present

## 2017-08-03 DIAGNOSIS — Z1389 Encounter for screening for other disorder: Secondary | ICD-10-CM | POA: Diagnosis not present

## 2017-08-03 DIAGNOSIS — E559 Vitamin D deficiency, unspecified: Secondary | ICD-10-CM | POA: Diagnosis not present

## 2017-08-03 DIAGNOSIS — E782 Mixed hyperlipidemia: Secondary | ICD-10-CM | POA: Diagnosis not present

## 2017-08-03 DIAGNOSIS — E1165 Type 2 diabetes mellitus with hyperglycemia: Secondary | ICD-10-CM | POA: Diagnosis not present

## 2017-08-03 DIAGNOSIS — E039 Hypothyroidism, unspecified: Secondary | ICD-10-CM | POA: Diagnosis not present

## 2018-01-11 DIAGNOSIS — M1711 Unilateral primary osteoarthritis, right knee: Secondary | ICD-10-CM | POA: Diagnosis not present

## 2018-01-11 DIAGNOSIS — E782 Mixed hyperlipidemia: Secondary | ICD-10-CM | POA: Diagnosis not present

## 2018-01-11 DIAGNOSIS — J439 Emphysema, unspecified: Secondary | ICD-10-CM | POA: Diagnosis not present

## 2018-01-11 DIAGNOSIS — Z1389 Encounter for screening for other disorder: Secondary | ICD-10-CM | POA: Diagnosis not present

## 2018-01-11 DIAGNOSIS — E119 Type 2 diabetes mellitus without complications: Secondary | ICD-10-CM | POA: Diagnosis not present

## 2018-01-11 DIAGNOSIS — Z0001 Encounter for general adult medical examination with abnormal findings: Secondary | ICD-10-CM | POA: Diagnosis not present

## 2018-01-11 DIAGNOSIS — Z23 Encounter for immunization: Secondary | ICD-10-CM | POA: Diagnosis not present

## 2018-07-09 DIAGNOSIS — Z1389 Encounter for screening for other disorder: Secondary | ICD-10-CM | POA: Diagnosis not present

## 2018-07-09 DIAGNOSIS — E7849 Other hyperlipidemia: Secondary | ICD-10-CM | POA: Diagnosis not present

## 2018-07-09 DIAGNOSIS — J439 Emphysema, unspecified: Secondary | ICD-10-CM | POA: Diagnosis not present

## 2018-07-09 DIAGNOSIS — E119 Type 2 diabetes mellitus without complications: Secondary | ICD-10-CM | POA: Diagnosis not present

## 2018-07-09 DIAGNOSIS — I10 Essential (primary) hypertension: Secondary | ICD-10-CM | POA: Diagnosis not present

## 2018-12-16 DIAGNOSIS — E038 Other specified hypothyroidism: Secondary | ICD-10-CM | POA: Diagnosis not present

## 2018-12-16 DIAGNOSIS — Z0001 Encounter for general adult medical examination with abnormal findings: Secondary | ICD-10-CM | POA: Diagnosis not present

## 2018-12-16 DIAGNOSIS — E063 Autoimmune thyroiditis: Secondary | ICD-10-CM | POA: Diagnosis not present

## 2018-12-16 DIAGNOSIS — E7849 Other hyperlipidemia: Secondary | ICD-10-CM | POA: Diagnosis not present

## 2018-12-16 DIAGNOSIS — I10 Essential (primary) hypertension: Secondary | ICD-10-CM | POA: Diagnosis not present

## 2018-12-16 DIAGNOSIS — Z1389 Encounter for screening for other disorder: Secondary | ICD-10-CM | POA: Diagnosis not present

## 2018-12-16 DIAGNOSIS — E119 Type 2 diabetes mellitus without complications: Secondary | ICD-10-CM | POA: Diagnosis not present

## 2019-01-24 DIAGNOSIS — Z23 Encounter for immunization: Secondary | ICD-10-CM | POA: Diagnosis not present

## 2019-01-26 DIAGNOSIS — E063 Autoimmune thyroiditis: Secondary | ICD-10-CM | POA: Diagnosis not present

## 2019-01-26 DIAGNOSIS — E7849 Other hyperlipidemia: Secondary | ICD-10-CM | POA: Diagnosis not present

## 2019-01-26 DIAGNOSIS — I10 Essential (primary) hypertension: Secondary | ICD-10-CM | POA: Diagnosis not present

## 2019-01-26 DIAGNOSIS — J449 Chronic obstructive pulmonary disease, unspecified: Secondary | ICD-10-CM | POA: Diagnosis not present

## 2019-02-26 DIAGNOSIS — E1165 Type 2 diabetes mellitus with hyperglycemia: Secondary | ICD-10-CM | POA: Diagnosis not present

## 2019-02-26 DIAGNOSIS — E7849 Other hyperlipidemia: Secondary | ICD-10-CM | POA: Diagnosis not present

## 2019-02-26 DIAGNOSIS — Z79899 Other long term (current) drug therapy: Secondary | ICD-10-CM | POA: Diagnosis not present

## 2019-02-26 DIAGNOSIS — Z7984 Long term (current) use of oral hypoglycemic drugs: Secondary | ICD-10-CM | POA: Diagnosis not present

## 2019-03-08 ENCOUNTER — Other Ambulatory Visit: Payer: Self-pay

## 2019-03-08 DIAGNOSIS — Z20822 Contact with and (suspected) exposure to covid-19: Secondary | ICD-10-CM

## 2019-03-09 LAB — NOVEL CORONAVIRUS, NAA: SARS-CoV-2, NAA: NOT DETECTED

## 2019-03-11 ENCOUNTER — Telehealth: Payer: Self-pay | Admitting: *Deleted

## 2019-03-11 NOTE — Telephone Encounter (Signed)
Pt called in requesting COVID-19 test result.   I let him know it was not detected/negative meaning he did not have the virus. He thanked me for my help.

## 2019-03-17 DIAGNOSIS — E7849 Other hyperlipidemia: Secondary | ICD-10-CM | POA: Diagnosis not present

## 2019-03-17 DIAGNOSIS — J439 Emphysema, unspecified: Secondary | ICD-10-CM | POA: Diagnosis not present

## 2019-03-17 DIAGNOSIS — Z23 Encounter for immunization: Secondary | ICD-10-CM | POA: Diagnosis not present

## 2019-03-17 DIAGNOSIS — E119 Type 2 diabetes mellitus without complications: Secondary | ICD-10-CM | POA: Diagnosis not present

## 2019-03-22 ENCOUNTER — Other Ambulatory Visit: Payer: Self-pay | Admitting: *Deleted

## 2019-03-22 DIAGNOSIS — Z20822 Contact with and (suspected) exposure to covid-19: Secondary | ICD-10-CM

## 2019-03-24 LAB — SPECIMEN STATUS REPORT

## 2019-03-24 LAB — NOVEL CORONAVIRUS, NAA: SARS-CoV-2, NAA: DETECTED — AB

## 2019-04-26 DIAGNOSIS — E039 Hypothyroidism, unspecified: Secondary | ICD-10-CM | POA: Diagnosis not present

## 2019-04-26 DIAGNOSIS — U071 COVID-19: Secondary | ICD-10-CM | POA: Diagnosis not present

## 2019-04-28 DIAGNOSIS — Z7984 Long term (current) use of oral hypoglycemic drugs: Secondary | ICD-10-CM | POA: Diagnosis not present

## 2019-04-28 DIAGNOSIS — Z79899 Other long term (current) drug therapy: Secondary | ICD-10-CM | POA: Diagnosis not present

## 2019-04-28 DIAGNOSIS — E1165 Type 2 diabetes mellitus with hyperglycemia: Secondary | ICD-10-CM | POA: Diagnosis not present

## 2019-04-28 DIAGNOSIS — I10 Essential (primary) hypertension: Secondary | ICD-10-CM | POA: Diagnosis not present

## 2019-05-29 DIAGNOSIS — Z79899 Other long term (current) drug therapy: Secondary | ICD-10-CM | POA: Diagnosis not present

## 2019-05-29 DIAGNOSIS — I10 Essential (primary) hypertension: Secondary | ICD-10-CM | POA: Diagnosis not present

## 2019-05-29 DIAGNOSIS — Z7984 Long term (current) use of oral hypoglycemic drugs: Secondary | ICD-10-CM | POA: Diagnosis not present

## 2019-05-29 DIAGNOSIS — E1165 Type 2 diabetes mellitus with hyperglycemia: Secondary | ICD-10-CM | POA: Diagnosis not present

## 2019-06-26 DIAGNOSIS — Z7984 Long term (current) use of oral hypoglycemic drugs: Secondary | ICD-10-CM | POA: Diagnosis not present

## 2019-06-26 DIAGNOSIS — E1165 Type 2 diabetes mellitus with hyperglycemia: Secondary | ICD-10-CM | POA: Diagnosis not present

## 2019-06-26 DIAGNOSIS — I10 Essential (primary) hypertension: Secondary | ICD-10-CM | POA: Diagnosis not present

## 2019-06-26 DIAGNOSIS — Z79899 Other long term (current) drug therapy: Secondary | ICD-10-CM | POA: Diagnosis not present

## 2019-07-18 DIAGNOSIS — E039 Hypothyroidism, unspecified: Secondary | ICD-10-CM | POA: Diagnosis not present

## 2019-07-18 DIAGNOSIS — Z1389 Encounter for screening for other disorder: Secondary | ICD-10-CM | POA: Diagnosis not present

## 2019-07-18 DIAGNOSIS — M1711 Unilateral primary osteoarthritis, right knee: Secondary | ICD-10-CM | POA: Diagnosis not present

## 2019-07-18 DIAGNOSIS — E119 Type 2 diabetes mellitus without complications: Secondary | ICD-10-CM | POA: Diagnosis not present

## 2019-07-18 DIAGNOSIS — Z0001 Encounter for general adult medical examination with abnormal findings: Secondary | ICD-10-CM | POA: Diagnosis not present

## 2019-07-27 DIAGNOSIS — I10 Essential (primary) hypertension: Secondary | ICD-10-CM | POA: Diagnosis not present

## 2019-07-27 DIAGNOSIS — Z7984 Long term (current) use of oral hypoglycemic drugs: Secondary | ICD-10-CM | POA: Diagnosis not present

## 2019-07-27 DIAGNOSIS — E1165 Type 2 diabetes mellitus with hyperglycemia: Secondary | ICD-10-CM | POA: Diagnosis not present

## 2019-08-23 DIAGNOSIS — E119 Type 2 diabetes mellitus without complications: Secondary | ICD-10-CM | POA: Diagnosis not present

## 2019-09-26 DIAGNOSIS — E1165 Type 2 diabetes mellitus with hyperglycemia: Secondary | ICD-10-CM | POA: Diagnosis not present

## 2019-09-26 DIAGNOSIS — I10 Essential (primary) hypertension: Secondary | ICD-10-CM | POA: Diagnosis not present

## 2019-09-26 DIAGNOSIS — Z7984 Long term (current) use of oral hypoglycemic drugs: Secondary | ICD-10-CM | POA: Diagnosis not present

## 2019-10-26 DIAGNOSIS — I10 Essential (primary) hypertension: Secondary | ICD-10-CM | POA: Diagnosis not present

## 2019-10-26 DIAGNOSIS — E1165 Type 2 diabetes mellitus with hyperglycemia: Secondary | ICD-10-CM | POA: Diagnosis not present

## 2019-10-26 DIAGNOSIS — Z7984 Long term (current) use of oral hypoglycemic drugs: Secondary | ICD-10-CM | POA: Diagnosis not present

## 2019-11-18 DIAGNOSIS — E7849 Other hyperlipidemia: Secondary | ICD-10-CM | POA: Diagnosis not present

## 2019-11-18 DIAGNOSIS — E559 Vitamin D deficiency, unspecified: Secondary | ICD-10-CM | POA: Diagnosis not present

## 2019-11-18 DIAGNOSIS — E119 Type 2 diabetes mellitus without complications: Secondary | ICD-10-CM | POA: Diagnosis not present

## 2019-11-18 DIAGNOSIS — E039 Hypothyroidism, unspecified: Secondary | ICD-10-CM | POA: Diagnosis not present

## 2019-11-25 DIAGNOSIS — I10 Essential (primary) hypertension: Secondary | ICD-10-CM | POA: Diagnosis not present

## 2019-11-25 DIAGNOSIS — Z7984 Long term (current) use of oral hypoglycemic drugs: Secondary | ICD-10-CM | POA: Diagnosis not present

## 2019-11-25 DIAGNOSIS — E1165 Type 2 diabetes mellitus with hyperglycemia: Secondary | ICD-10-CM | POA: Diagnosis not present

## 2019-12-28 ENCOUNTER — Other Ambulatory Visit: Payer: Self-pay | Admitting: Family Medicine

## 2019-12-28 ENCOUNTER — Other Ambulatory Visit (HOSPITAL_COMMUNITY): Payer: Self-pay | Admitting: Family Medicine

## 2019-12-28 DIAGNOSIS — J069 Acute upper respiratory infection, unspecified: Secondary | ICD-10-CM | POA: Diagnosis not present

## 2019-12-28 DIAGNOSIS — R042 Hemoptysis: Secondary | ICD-10-CM

## 2020-01-09 ENCOUNTER — Ambulatory Visit (HOSPITAL_COMMUNITY)
Admission: RE | Admit: 2020-01-09 | Discharge: 2020-01-09 | Disposition: A | Discharge status: Home / Self Care | Payer: Medicare Other | Source: Ambulatory Visit | Attending: Family Medicine | Admitting: Family Medicine

## 2020-01-09 ENCOUNTER — Other Ambulatory Visit: Payer: Self-pay

## 2020-01-09 DIAGNOSIS — R042 Hemoptysis: Secondary | ICD-10-CM | POA: Diagnosis not present

## 2020-01-09 DIAGNOSIS — I10 Essential (primary) hypertension: Secondary | ICD-10-CM | POA: Diagnosis not present

## 2020-01-09 DIAGNOSIS — J9809 Other diseases of bronchus, not elsewhere classified: Secondary | ICD-10-CM | POA: Diagnosis not present

## 2020-01-09 DIAGNOSIS — R590 Localized enlarged lymph nodes: Secondary | ICD-10-CM | POA: Diagnosis not present

## 2020-01-09 DIAGNOSIS — R918 Other nonspecific abnormal finding of lung field: Secondary | ICD-10-CM | POA: Diagnosis not present

## 2020-01-26 ENCOUNTER — Other Ambulatory Visit: Payer: Self-pay | Admitting: *Deleted

## 2020-01-26 ENCOUNTER — Institutional Professional Consult (permissible substitution) (INDEPENDENT_AMBULATORY_CARE_PROVIDER_SITE_OTHER): Payer: Medicare Other | Admitting: Thoracic Surgery (Cardiothoracic Vascular Surgery)

## 2020-01-26 ENCOUNTER — Encounter: Payer: Self-pay | Admitting: Thoracic Surgery (Cardiothoracic Vascular Surgery)

## 2020-01-26 ENCOUNTER — Other Ambulatory Visit: Payer: Self-pay

## 2020-01-26 DIAGNOSIS — R918 Other nonspecific abnormal finding of lung field: Secondary | ICD-10-CM

## 2020-01-26 DIAGNOSIS — E1165 Type 2 diabetes mellitus with hyperglycemia: Secondary | ICD-10-CM | POA: Diagnosis not present

## 2020-01-26 DIAGNOSIS — Z01818 Encounter for other preprocedural examination: Secondary | ICD-10-CM

## 2020-01-26 DIAGNOSIS — C7931 Secondary malignant neoplasm of brain: Secondary | ICD-10-CM

## 2020-01-26 DIAGNOSIS — C3411 Malignant neoplasm of upper lobe, right bronchus or lung: Secondary | ICD-10-CM | POA: Insufficient documentation

## 2020-01-26 DIAGNOSIS — I10 Essential (primary) hypertension: Secondary | ICD-10-CM | POA: Diagnosis not present

## 2020-01-26 DIAGNOSIS — R590 Localized enlarged lymph nodes: Secondary | ICD-10-CM

## 2020-01-26 DIAGNOSIS — Z7984 Long term (current) use of oral hypoglycemic drugs: Secondary | ICD-10-CM | POA: Diagnosis not present

## 2020-01-26 NOTE — Pre-Procedure Instructions (Signed)
Your procedure is scheduled on Monday, October 4th at 7:30a.m.  Report to [facility] Main Entrance "A" at 5:30 A.M., and check in at the Admitting office.  Call this number if you have problems the morning of surgery:  [phone]  Call [phone] if you have any questions prior to your surgery date Monday-Friday 8am-4pm    Remember:  Do not eat or drink after midnight the night before your surgery    Take these medicines the morning of surgery with A SIP OF WATER levothyroxine (SYNTHROID)  tamsulosin (FLOMAX)  As needed: acetaminophen (TYLENOL)   Follow your surgeon's instructions on when to stop Aspirin.  If no instructions were given by your surgeon then you will need to call the office to get those instructions.    As of today, STOP taking any Aspirin (unless otherwise instructed by your surgeon) Aleve, Naproxen, Ibuprofen, Motrin, Advil, Goody's, BC's, all herbal medications, fish oil, and all vitamins.   WHAT DO I DO ABOUT MY DIABETES MEDICATION?   Marland Kitchen Do not take metFORMIN (GLUCOPHAGE) on the morning of surgery.    HOW TO MANAGE YOUR DIABETES BEFORE AND AFTER SURGERY  Why is it important to control my blood sugar before and after surgery? . Improving blood sugar levels before and after surgery helps healing and can limit problems. . A way of improving blood sugar control is eating a healthy diet by: o  Eating less sugar and carbohydrates o  Increasing activity/exercise o  Talking with your doctor about reaching your blood sugar goals . High blood sugars (greater than 180 mg/dL) can raise your risk of infections and slow your recovery, so you will need to focus on controlling your diabetes during the weeks before surgery. . Make sure that the doctor who takes care of your diabetes knows about your planned surgery including the date and location.  How do I manage my blood sugar before surgery? . Check your blood sugar at least 4 times a day, starting 2 days before  surgery, to make sure that the level is not too high or low. . Check your blood sugar the morning of your surgery when you wake up and every 2 hours until you get to the Short Stay unit. o If your blood sugar is less than 70 mg/dL, you will need to treat for low blood sugar: - Do not take insulin. - Treat a low blood sugar (less than 70 mg/dL) with  cup of clear juice (cranberry or apple), 4 glucose tablets, OR glucose gel. - Recheck blood sugar in 15 minutes after treatment (to make sure it is greater than 70 mg/dL). If your blood sugar is not greater than 70 mg/dL on recheck, call [phone] for further instructions. . Report your blood sugar to the short stay nurse when you get to Short Stay.  . If you are admitted to the hospital after surgery: o Your blood sugar will be checked by the staff and you will probably be given insulin after surgery (instead of oral diabetes medicines) to make sure you have good blood sugar levels. o The goal for blood sugar control after surgery is 80-180 mg/dL.                    Do not wear jewelry.            Do not wear lotions, powders, colognes, or deodorant.            Men may shave face and neck.  Do not bring valuables to the hospital.            [facility] is not responsible for any belongings or valuables.  Do NOT Smoke (Tobacco/Vaping) or drink Alcohol 24 hours prior to your procedure If you use a CPAP at night, you may bring all equipment for your overnight stay.   Contacts, glasses, dentures or bridgework may not be worn into surgery.      For patients admitted to the hospital, discharge time will be determined by your treatment team.   Patients discharged the day of surgery will not be allowed to drive home, and someone needs to stay with them for 24 hours.    Special instructions:   [facility]- Preparing For Surgery  Before surgery, you can play an important role. Because skin is not sterile, your skin needs to be as  free of germs as possible. You can reduce the number of germs on your skin by washing with CHG (chlorahexidine gluconate) Soap before surgery.  CHG is an antiseptic cleaner which kills germs and bonds with the skin to continue killing germs even after washing.    Oral Hygiene is also important to reduce your risk of infection.  Remember - BRUSH YOUR TEETH THE MORNING OF SURGERY WITH YOUR REGULAR TOOTHPASTE  Please do not use if you have an allergy to CHG or antibacterial soaps. If your skin becomes reddened/irritated stop using the CHG.  Do not shave (including legs and underarms) for at least 48 hours prior to first CHG shower. It is OK to shave your face.  Please follow these instructions carefully.   1. Shower the NIGHT BEFORE SURGERY and the MORNING OF SURGERY with CHG Soap.   2. If you chose to wash your hair, wash your hair first as usual with your normal shampoo.  3. After you shampoo, rinse your hair and body thoroughly to remove the shampoo.  4. Use CHG as you would any other liquid soap. You can apply CHG directly to the skin and wash gently with a scrungie or a clean washcloth.   5. Apply the CHG Soap to your body ONLY FROM THE NECK DOWN.  Do not use on open wounds or open sores. Avoid contact with your eyes, ears, mouth and genitals (private parts). Wash Face and genitals (private parts)  with your normal soap.   6. Wash thoroughly, paying special attention to the area where your surgery will be performed.  7. Thoroughly rinse your body with warm water from the neck down.  8. DO NOT shower/wash with your normal soap after using and rinsing off the CHG Soap.  9. Pat yourself dry with a CLEAN TOWEL.  10. Wear CLEAN PAJAMAS to bed the night before surgery  11. Place CLEAN SHEETS on your bed the night of your first shower and DO NOT SLEEP WITH PETS.   Day of Surgery: Wear Clean/Comfortable clothing the morning of surgery Do not apply any deodorants/lotions.   Remember to  brush your teeth WITH YOUR REGULAR TOOTHPASTE.   Please read over the following fact sheets that you were given.

## 2020-01-26 NOTE — H&P (View-Only) (Signed)
PCP is [name], MD Referring Provider is [name], MD  Chief Complaint  Patient presents with  . Lung Mass    new patient consultation, CT 01/15/20    HPI: Mr. Billy Hall was sent for consultation regarding a newly discovered right upper lobe lung mass.  Billy Hall is a 74 year old man with a history of hypertension, hyperlipidemia, type 2 diabetes without complication, depression, and hypothyroidism.  He is a former smoker, smoking just less than a pack a day for about 45 years prior to quitting in 2004.  He was in his usual state of health until 1 September.  He developed a cough and noticed that there was some blood in the sputum.  He was treated for presumed pneumonia with Augmentin.  Given his smoking history he also had a CT of the chest done.  That showed a large right upper lobe mass measuring 6 x 10 cm with some mild paratracheal adenopathy.  He gets short of breath with heavy exertion.  He can walk up a flight of stairs.  He is not had any change in appetite or weight loss.  He does complain of some decreased energy and some dizzy spells.  Activity somewhat restricted due to arthritis in his knees.  No chest pain, pressure, or tightness.  Zubrod Score: At the time of surgery this patient's most appropriate activity status/level should be described as: []     0    Normal activity, no symptoms [x]     1    Restricted in physical strenuous activity but ambulatory, able to do out light work []     2    Ambulatory and capable of self care, unable to do work activities, up and about >50 % of waking hours                              []     3    Only limited self care, in bed greater than 50% of waking hours []     4    Completely disabled, no self care, confined to bed or chair []     5    Moribund  Past Medical History:  Diagnosis Date  . Depressive disorder   . Diabetes mellitus without complication ([place])   . Fatigue   . Headache(784.0)   . Hyperlipidemia   .  Hypertension   . Hypothyroidism   . Snoring     Past Surgical History:  Procedure Laterality Date  . COLONOSCOPY N/A 03/08/2013   Procedure: COLONOSCOPY;  Surgeon: Jamesetta So, MD;  Location: [facility];  Service: Gastroenterology;  Laterality: N/A;  . THYROID SURGERY      Family History  Problem Relation Age of Onset  . Alzheimer's disease Mother     Social History Social History   Tobacco Use  . Smoking status: Former Research scientist (life sciences)  . Smokeless tobacco: Never Used  . Tobacco comment: Quit 2004  Substance Use Topics  . Alcohol use: Yes    Alcohol/week: 0.0 standard drinks    Comment: Wekends  . Drug use: No    Current Outpatient Medications  Medication Sig Dispense Refill  . acetaminophen (TYLENOL) 500 MG tablet Take 500 mg by mouth every 6 (six) hours as needed.    Marland Kitchen aspirin EC 81 MG tablet Take 81 mg by mouth daily.    . cholecalciferol (VITAMIN D3) 25 MCG (1000 UNIT) tablet Take 1,000 Units by mouth daily.    Marland Kitchen  levothyroxine (SYNTHROID, LEVOTHROID) 75 MCG tablet Take 75 mcg by mouth daily before breakfast.    . losartan (COZAAR) 100 MG tablet Take 50 mg by mouth daily.    . metFORMIN (GLUCOPHAGE) 1000 MG tablet Take 1,000 mg by mouth 2 (two) times daily with a meal.    . sildenafil (REVATIO) 20 MG tablet Take 20 mg by mouth 3 (three) times daily.    . simvastatin (ZOCOR) 40 MG tablet Take 20 mg by mouth every evening.    . tamsulosin (FLOMAX) 0.4 MG CAPS capsule Take 0.4 mg by mouth daily.     No current facility-administered medications for this visit.    Allergies  Allergen Reactions  . Ace Inhibitors     Review of Systems  Constitutional: Positive for fatigue. Negative for activity change and unexpected weight change.  HENT: Negative for trouble swallowing and voice change.   Eyes: Negative for visual disturbance.  Respiratory: Positive for cough (Hemoptysis), shortness of breath and wheezing. Negative for chest tightness.   Cardiovascular: Negative for  chest pain and leg swelling.  Gastrointestinal: Negative for abdominal distention and abdominal pain.  Genitourinary: Positive for frequency.       On Flomax  Musculoskeletal: Positive for arthralgias and joint swelling.  Neurological: Positive for dizziness. Negative for seizures, syncope and weakness.  Hematological: Negative for adenopathy. Does not bruise/bleed easily.  Psychiatric/Behavioral: Positive for dysphoric mood.  All other systems reviewed and are negative.   BP (!) 157/88 (BP Location: Left Arm, Patient Position: Sitting, Cuff Size: Normal)   Pulse 81   Temp (!) 97.5 F (36.4 C)   Resp 18   Ht 5' 9.5" (1.765 m)   Wt 260 lb 12.8 oz (118.3 kg)   SpO2 95% Comment: RA  BMI 37.96 kg/m  Physical Exam Vitals reviewed.  Constitutional:      General: He is not in acute distress.    Appearance: He is obese.  HENT:     Head: Normocephalic and atraumatic.  Eyes:     General: No scleral icterus.    Extraocular Movements: Extraocular movements intact.  Neck:     Vascular: No carotid bruit.  Cardiovascular:     Rate and Rhythm: Normal rate and regular rhythm.     Heart sounds: Normal heart sounds. No murmur heard.  No friction rub. No gallop.   Pulmonary:     Effort: Pulmonary effort is normal. No respiratory distress.     Breath sounds: No wheezing.  Abdominal:     General: There is no distension.     Palpations: Abdomen is soft.     Tenderness: There is no abdominal tenderness.  Musculoskeletal:        General: No swelling.     Cervical back: Neck supple.  Lymphadenopathy:     Cervical: No cervical adenopathy.  Skin:    General: Skin is warm and dry.  Neurological:     General: No focal deficit present.     Mental Status: He is alert and oriented to person, place, and time.     Cranial Nerves: No cranial nerve deficit.     Motor: No weakness.   Diagnostic Tests: CT CHEST WITHOUT CONTRAST  TECHNIQUE: Multidetector CT imaging of the chest was performed  following the standard protocol without IV contrast.  COMPARISON:  CT chest 03/15/2007, chest x-ray 04/05/2009  FINDINGS: Cardiovascular: No significant vascular findings. Normal heart size. No pericardial effusion. The thoracic aorta is normal in caliber. The main pulmonary artery is margin caliber  measuring up to 3.2 cm.  Mediastinum/Nodes: Enlarged right hilar lymph node measuring up to 1.2 cm (05/29/2040). Prominent but nonenlarged 0.8 cm right paratracheal lymph node (2:31). No enlarged mediastinal or axillary lymph nodes. The right thyroid gland is surgically absent. The left thyroid gland is unremarkable. The esophagus demonstrate no significant findings.  Lungs/Pleura:  Interval development of a peribronchovascular/perihilar mass with a couple areas of punctate calcification (2:31, 33). The mass measures up to 10 x 6 cm on axial imaging (2:33) and 3.5 cm on coronal imaging. Associated ground-glass airspace opacity in a patchy airspace opacity within right apex is noted.  There is linear atelectasis versus scarring within bilateral upper lobes and the right lower lobe.  No other pulmonary mass identified. Subpleural micronodule within left lower lobe (4:8). No focal consolidation within the left lung.  The trachea is patent. The left central airways are patent. There is narrowing of the proximal right upper lobe bronchus with complete occlusion of the mid to distal right upper lobe bronchus both due to external compression from mass described above. Trace debris noted within the right mainstem bronchus.  Upper Abdomen: No acute abnormality.  Musculoskeletal:  No chest wall abnormality  No suspicious lytic or blastic osseous lesions. No acute displaced fracture. Multilevel degenerative changes of the spine.  IMPRESSION: 1. Interval development of a right upper lobe perihilar 10 x 6 x 3.5 cm mass with an associated enlarged 1.2 cm right hilar lymph  node. 2. Associated right apical patchy ground-glass airspace opacity may represent a combination of postobstructive atelectasis and/or pneumonitis in the setting of narrowing of the proximal right upper lobe bronchus and complete occlusion of the mid to distal bronchial due to external compression by the mass. 3. Main pulmonary artery enlargement. Correlate for pulmonary hypertension.  These results will be called to the ordering clinician or representative by the Radiologist Assistant, and communication documented in the PACS or [employer].   Electronically Signed   By: Iven Finn M.D.   On: 01/10/2020 20:17 I personally reviewed the CT images and concur with the findings noted above.  Impression: Billy Hall is a 74 year old former smoker with a history of hypertension, hyperlipidemia, type 2 diabetes, depression, and hypothyroidism.  He developed hemoptysis in early September.  Work-up included a CT of the chest which showed a large right upper lobe mass with significant volume loss in the right upper lobe.  The mass is almost certainly a primary bronchogenic carcinoma and has to be considered that less can be proven otherwise.  Non-small cell versus small cell is the primary consideration and this most likely is a non-small cell.  Infectious and inflammatory etiologies are far less likely.  It is difficult to tell from the CT whether this is potentially resectable.  We need to complete his diagnostic and staging work-up.  He needs a PET/CT, MRI of the brain, and a biopsy.  The best option for biopsy is to do a bronchoscopy and endobronchial ultrasound that will also provide some information regarding the right paratracheal node.  I described the proposed procedure to Mr. Vahle.  He understands this will be done under general anesthesia.  Apparently he had a bronchoscopy by Dr. Arlyce Dice in the past but would not have any records of that in epic.  We will plan to  do the procedure under general anesthesia.  I informed her of the indications, risk, benefits, and alternatives.  He understands the risks include, but not limited to death, MI, blood clot, bleeding,  failure to make a diagnosis, as well as the possibility of other unforeseeable complications.  He accepts the risk and agrees to proceed.  He does have some enlargement of his pulmonary arteries and would need an echocardiogram before any consideration for surgical resection.  He also would need pulmonary function testing before consideration for surgical resection.  We will wait and see the results of his metastatic work-up and biopsies before we do those additional tests.  Plan: PET/CT to guide initial diagnostic work-up MR brain to complete metastatic work-up Bronchoscopy and endobronchial ultrasound for diagnostic and staging purposes on Monday, 01/30/2020  I spent 45 minutes in review of images, review of records, and in consultation with Mr. Mcgath today. Melrose Nakayama, MD [facility] [phone]

## 2020-01-26 NOTE — Progress Notes (Signed)
PCP is [name], MD Referring Provider is [name], MD  Chief Complaint  Patient presents with  . Lung Mass    new patient consultation, CT 01/15/20    HPI: Billy Hall was sent for consultation regarding a newly discovered right upper lobe lung mass.  Billy Hall is a 74 year old man with a history of hypertension, hyperlipidemia, type 2 diabetes without complication, depression, and hypothyroidism.  Billy Hall is a former smoker, smoking just less than a pack a day for about 45 years prior to quitting in 2004.  Billy Hall was in his usual state of health until 1 September.  Billy Hall developed a cough and noticed that there was some blood in the sputum.  Billy Hall was treated for presumed pneumonia with Augmentin.  Given his smoking history Billy Hall also had a CT of the chest done.  That showed a large right upper lobe mass measuring 6 x 10 cm with some mild paratracheal adenopathy.  Billy Hall gets short of breath with heavy exertion.  Billy Hall can walk up a flight of stairs.  Billy Hall is not had any change in appetite or weight loss.  Billy Hall does complain of some decreased energy and some dizzy spells.  Activity somewhat restricted due to arthritis in his knees.  No chest pain, pressure, or tightness.  Zubrod Score: At the time of surgery this patient's most appropriate activity status/level should be described as: []     0    Normal activity, no symptoms [x]     1    Restricted in physical strenuous activity but ambulatory, able to do out light work []     2    Ambulatory and capable of self care, unable to do work activities, up and about >50 % of waking hours                              []     3    Only limited self care, in bed greater than 50% of waking hours []     4    Completely disabled, no self care, confined to bed or chair []     5    Moribund  Past Medical History:  Diagnosis Date  . Depressive disorder   . Diabetes mellitus without complication ([place])   . Fatigue   . Headache(784.0)   . Hyperlipidemia   .  Hypertension   . Hypothyroidism   . Snoring     Past Surgical History:  Procedure Laterality Date  . COLONOSCOPY N/A 03/08/2013   Procedure: COLONOSCOPY;  Surgeon: Jamesetta So, MD;  Location: [facility];  Service: Gastroenterology;  Laterality: N/A;  . THYROID SURGERY      Family History  Problem Relation Age of Onset  . Alzheimer's disease Mother     Social History Social History   Tobacco Use  . Smoking status: Former Research scientist (life sciences)  . Smokeless tobacco: Never Used  . Tobacco comment: Quit 2004  Substance Use Topics  . Alcohol use: Yes    Alcohol/week: 0.0 standard drinks    Comment: Wekends  . Drug use: No    Current Outpatient Medications  Medication Sig Dispense Refill  . acetaminophen (TYLENOL) 500 MG tablet Take 500 mg by mouth every 6 (six) hours as needed.    Marland Kitchen aspirin EC 81 MG tablet Take 81 mg by mouth daily.    . cholecalciferol (VITAMIN D3) 25 MCG (1000 UNIT) tablet Take 1,000 Units by mouth daily.    Marland Kitchen  levothyroxine (SYNTHROID, LEVOTHROID) 75 MCG tablet Take 75 mcg by mouth daily before breakfast.    . losartan (COZAAR) 100 MG tablet Take 50 mg by mouth daily.    . metFORMIN (GLUCOPHAGE) 1000 MG tablet Take 1,000 mg by mouth 2 (two) times daily with a meal.    . sildenafil (REVATIO) 20 MG tablet Take 20 mg by mouth 3 (three) times daily.    . simvastatin (ZOCOR) 40 MG tablet Take 20 mg by mouth every evening.    . tamsulosin (FLOMAX) 0.4 MG CAPS capsule Take 0.4 mg by mouth daily.     No current facility-administered medications for this visit.    Allergies  Allergen Reactions  . Ace Inhibitors     Review of Systems  Constitutional: Positive for fatigue. Negative for activity change and unexpected weight change.  HENT: Negative for trouble swallowing and voice change.   Eyes: Negative for visual disturbance.  Respiratory: Positive for cough (Hemoptysis), shortness of breath and wheezing. Negative for chest tightness.   Cardiovascular: Negative for  chest pain and leg swelling.  Gastrointestinal: Negative for abdominal distention and abdominal pain.  Genitourinary: Positive for frequency.       On Flomax  Musculoskeletal: Positive for arthralgias and joint swelling.  Neurological: Positive for dizziness. Negative for seizures, syncope and weakness.  Hematological: Negative for adenopathy. Does not bruise/bleed easily.  Psychiatric/Behavioral: Positive for dysphoric mood.  All other systems reviewed and are negative.   BP (!) 157/88 (BP Location: Left Arm, Patient Position: Sitting, Cuff Size: Normal)   Pulse 81   Temp (!) 97.5 F (36.4 C)   Resp 18   Ht 5' 9.5" (1.765 m)   Wt 260 lb 12.8 oz (118.3 kg)   SpO2 95% Comment: RA  BMI 37.96 kg/m  Physical Exam Vitals reviewed.  Constitutional:      General: Billy Hall is not in acute distress.    Appearance: Billy Hall is obese.  HENT:     Head: Normocephalic and atraumatic.  Eyes:     General: No scleral icterus.    Extraocular Movements: Extraocular movements intact.  Neck:     Vascular: No carotid bruit.  Cardiovascular:     Rate and Rhythm: Normal rate and regular rhythm.     Heart sounds: Normal heart sounds. No murmur heard.  No friction rub. No gallop.   Pulmonary:     Effort: Pulmonary effort is normal. No respiratory distress.     Breath sounds: No wheezing.  Abdominal:     General: There is no distension.     Palpations: Abdomen is soft.     Tenderness: There is no abdominal tenderness.  Musculoskeletal:        General: No swelling.     Cervical back: Neck supple.  Lymphadenopathy:     Cervical: No cervical adenopathy.  Skin:    General: Skin is warm and dry.  Neurological:     General: No focal deficit present.     Mental Status: Billy Hall is alert and oriented to person, place, and time.     Cranial Nerves: No cranial nerve deficit.     Motor: No weakness.   Diagnostic Tests: CT CHEST WITHOUT CONTRAST  TECHNIQUE: Multidetector CT imaging of the chest was performed  following the standard protocol without IV contrast.  COMPARISON:  CT chest 03/15/2007, chest x-ray 04/05/2009  FINDINGS: Cardiovascular: No significant vascular findings. Normal heart size. No pericardial effusion. The thoracic aorta is normal in caliber. The main pulmonary artery is margin caliber  measuring up to 3.2 cm.  Mediastinum/Nodes: Enlarged right hilar lymph node measuring up to 1.2 cm (05/29/2040). Prominent but nonenlarged 0.8 cm right paratracheal lymph node (2:31). No enlarged mediastinal or axillary lymph nodes. The right thyroid gland is surgically absent. The left thyroid gland is unremarkable. The esophagus demonstrate no significant findings.  Lungs/Pleura:  Interval development of a peribronchovascular/perihilar mass with a couple areas of punctate calcification (2:31, 33). The mass measures up to 10 x 6 cm on axial imaging (2:33) and 3.5 cm on coronal imaging. Associated ground-glass airspace opacity in a patchy airspace opacity within right apex is noted.  There is linear atelectasis versus scarring within bilateral upper lobes and the right lower lobe.  No other pulmonary mass identified. Subpleural micronodule within left lower lobe (4:8). No focal consolidation within the left lung.  The trachea is patent. The left central airways are patent. There is narrowing of the proximal right upper lobe bronchus with complete occlusion of the mid to distal right upper lobe bronchus both due to external compression from mass described above. Trace debris noted within the right mainstem bronchus.  Upper Abdomen: No acute abnormality.  Musculoskeletal:  No chest wall abnormality  No suspicious lytic or blastic osseous lesions. No acute displaced fracture. Multilevel degenerative changes of the spine.  IMPRESSION: 1. Interval development of a right upper lobe perihilar 10 x 6 x 3.5 cm mass with an associated enlarged 1.2 cm right hilar lymph  node. 2. Associated right apical patchy ground-glass airspace opacity may represent a combination of postobstructive atelectasis and/or pneumonitis in the setting of narrowing of the proximal right upper lobe bronchus and complete occlusion of the mid to distal bronchial due to external compression by the mass. 3. Main pulmonary artery enlargement. Correlate for pulmonary hypertension.  These results will be called to the ordering clinician or representative by the Radiologist Assistant, and communication documented in the PACS or [employer].   Electronically Signed   By: Iven Finn M.D.   On: 01/10/2020 20:17 I personally reviewed the CT images and concur with the findings noted above.  Impression: Imani Sherrin is a 74 year old former smoker with a history of hypertension, hyperlipidemia, type 2 diabetes, depression, and hypothyroidism.  Billy Hall developed hemoptysis in early September.  Work-up included a CT of the chest which showed a large right upper lobe mass with significant volume loss in the right upper lobe.  The mass is almost certainly a primary bronchogenic carcinoma and has to be considered that less can be proven otherwise.  Non-small cell versus small cell is the primary consideration and this most likely is a non-small cell.  Infectious and inflammatory etiologies are far less likely.  It is difficult to tell from the CT whether this is potentially resectable.  We need to complete his diagnostic and staging work-up.  Billy Hall needs a PET/CT, MRI of the brain, and a biopsy.  The best option for biopsy is to do a bronchoscopy and endobronchial ultrasound that will also provide some information regarding the right paratracheal node.  I described the proposed procedure to Mr. Henes.  Billy Hall understands this will be done under general anesthesia.  Apparently Billy Hall had a bronchoscopy by Dr. Arlyce Dice in the past but would not have any records of that in epic.  We will plan to  do the procedure under general anesthesia.  I informed her of the indications, risk, benefits, and alternatives.  Billy Hall understands the risks include, but not limited to death, MI, blood clot, bleeding,  failure to make a diagnosis, as well as the possibility of other unforeseeable complications.  Billy Hall accepts the risk and agrees to proceed.  Billy Hall does have some enlargement of his pulmonary arteries and would need an echocardiogram before any consideration for surgical resection.  Billy Hall also would need pulmonary function testing before consideration for surgical resection.  We will wait and see the results of his metastatic work-up and biopsies before we do those additional tests.  Plan: PET/CT to guide initial diagnostic work-up MR brain to complete metastatic work-up Bronchoscopy and endobronchial ultrasound for diagnostic and staging purposes on Monday, 01/30/2020  I spent 45 minutes in review of images, review of records, and in consultation with Mr. Sweitzer today. Melrose Nakayama, MD [facility] [phone]

## 2020-01-27 ENCOUNTER — Other Ambulatory Visit (HOSPITAL_COMMUNITY)
Admission: RE | Admit: 2020-01-27 | Discharge: 2020-01-27 | Disposition: A | Discharge status: Home / Self Care | Payer: Medicare Other | Source: Ambulatory Visit | Attending: Thoracic Surgery (Cardiothoracic Vascular Surgery) | Admitting: Thoracic Surgery (Cardiothoracic Vascular Surgery)

## 2020-01-27 ENCOUNTER — Encounter (HOSPITAL_COMMUNITY): Payer: Self-pay

## 2020-01-27 ENCOUNTER — Ambulatory Visit (HOSPITAL_COMMUNITY)
Admission: RE | Admit: 2020-01-27 | Discharge: 2020-01-27 | Disposition: A | Discharge status: Home / Self Care | Payer: Medicare Other | Source: Ambulatory Visit | Attending: Thoracic Surgery (Cardiothoracic Vascular Surgery) | Admitting: Thoracic Surgery (Cardiothoracic Vascular Surgery)

## 2020-01-27 ENCOUNTER — Encounter (HOSPITAL_COMMUNITY)
Admission: RE | Admit: 2020-01-27 | Discharge: 2020-01-27 | Disposition: A | Discharge status: Home / Self Care | Payer: Medicare Other | Source: Ambulatory Visit | Attending: Thoracic Surgery (Cardiothoracic Vascular Surgery) | Admitting: Thoracic Surgery (Cardiothoracic Vascular Surgery)

## 2020-01-27 DIAGNOSIS — Z87891 Personal history of nicotine dependence: Secondary | ICD-10-CM | POA: Insufficient documentation

## 2020-01-27 DIAGNOSIS — Z01818 Encounter for other preprocedural examination: Secondary | ICD-10-CM | POA: Insufficient documentation

## 2020-01-27 DIAGNOSIS — Z20822 Contact with and (suspected) exposure to covid-19: Secondary | ICD-10-CM | POA: Diagnosis not present

## 2020-01-27 DIAGNOSIS — R0602 Shortness of breath: Secondary | ICD-10-CM | POA: Diagnosis not present

## 2020-01-27 DIAGNOSIS — R590 Localized enlarged lymph nodes: Secondary | ICD-10-CM | POA: Diagnosis not present

## 2020-01-27 DIAGNOSIS — R918 Other nonspecific abnormal finding of lung field: Secondary | ICD-10-CM | POA: Diagnosis not present

## 2020-01-27 DIAGNOSIS — R059 Cough, unspecified: Secondary | ICD-10-CM | POA: Diagnosis not present

## 2020-01-27 DIAGNOSIS — I10 Essential (primary) hypertension: Secondary | ICD-10-CM | POA: Insufficient documentation

## 2020-01-27 DIAGNOSIS — E119 Type 2 diabetes mellitus without complications: Secondary | ICD-10-CM | POA: Insufficient documentation

## 2020-01-27 HISTORY — DX: Unspecified osteoarthritis, unspecified site: M19.90

## 2020-01-27 LAB — COMPREHENSIVE METABOLIC PANEL
ALT: 13 U/L (ref 0–44)
AST: 14 U/L — ABNORMAL LOW (ref 15–41)
Albumin: 3.5 g/dL (ref 3.5–5.0)
Alkaline Phosphatase: 58 U/L (ref 38–126)
Anion gap: 11 (ref 5–15)
BUN: 10 mg/dL (ref 8–23)
CO2: 26 mmol/L (ref 22–32)
Calcium: 9.2 mg/dL (ref 8.9–10.3)
Chloride: 100 mmol/L (ref 98–111)
Creatinine, Ser: 0.83 mg/dL (ref 0.61–1.24)
GFR calc Af Amer: >60 mL/min (ref >60)
GFR calc non Af Amer: >60 mL/min (ref >60)
Glucose, Bld: 143 mg/dL — ABNORMAL HIGH (ref 70–99)
Potassium: 4.1 mmol/L (ref 3.5–5.1)
Sodium: 137 mmol/L (ref 135–145)
Total Bilirubin: 0.8 mg/dL (ref 0.3–1.2)
Total Protein: 7.3 g/dL (ref 6.5–8.1)

## 2020-01-27 LAB — CBC
HCT: 44.4 % (ref 39.0–52.0)
Hemoglobin: 14.1 g/dL (ref 13.0–17.0)
MCH: 26.3 pg (ref 26.0–34.0)
MCHC: 31.8 g/dL (ref 30.0–36.0)
MCV: 82.7 fL (ref 80.0–100.0)
Platelets: 247 10*3/uL (ref 150–400)
RBC: 5.37 MIL/uL (ref 4.22–5.81)
RDW: 15.3 % (ref 11.5–15.5)
WBC: 8.2 10*3/uL (ref 4.0–10.5)
nRBC: 0 % (ref 0.0–0.2)

## 2020-01-27 LAB — PROTIME-INR
INR: 1.1 (ref 0.8–1.2)
Prothrombin Time: 13.3 seconds (ref 11.4–15.2)

## 2020-01-27 LAB — HEMOGLOBIN A1C
Hgb A1c MFr Bld: 6.7 % — ABNORMAL HIGH (ref 4.8–5.6)
Mean Plasma Glucose: 145.59 mg/dL

## 2020-01-27 LAB — APTT: aPTT: 33 seconds (ref 24–36)

## 2020-01-27 LAB — SARS CORONAVIRUS 2 (TAT 6-24 HRS): SARS Coronavirus 2: NEGATIVE

## 2020-01-27 LAB — GLUCOSE, CAPILLARY: Glucose-Capillary: 168 mg/dL — ABNORMAL HIGH (ref 70–99)

## 2020-01-27 NOTE — Progress Notes (Signed)
°   01/27/20 0859  OBSTRUCTIVE SLEEP APNEA  Have you ever been diagnosed with sleep apnea through a sleep study? No  Do you snore loudly (loud enough to be heard through closed doors)?  0  Do you often feel tired, fatigued, or sleepy during the daytime (such as falling asleep during driving or talking to someone)? 0  Has anyone observed you stop breathing during your sleep? 0  Do you have, or are you being treated for high blood pressure? 1  BMI more than 35 kg/m2? 1  Age > 50 (1-yes) 1  Neck circumference greater than:Male 16 inches or larger, Male 17inches or larger? 1  Male Gender (Yes=1) 1  Obstructive Sleep Apnea Score 5

## 2020-01-27 NOTE — Progress Notes (Signed)
PCP - Dr. [name] Cardiologist - denies  Chest x-ray - 01/27/20 EKG - 01/27/20 Stress Test - denies ECHO - denies Cardiac Cath - denies  Sleep Study - pt stated that he had one years ago, does not know when or where but did not require any follow up after study. Stop Bang assessment high, routed to PCP.  CPAP - denies  Fasting Blood Sugar - 120-140's Checks Blood Sugar __1___ times a day CBG at PAT: 168 Will collect A1C today, no previous lab value in Epic.   Blood Thinner Instructions:n/a Aspirin Instructions:Pt takes 81 mg with no instructions received. Advised to call and get instructions from Surgeon's office.  COVID TEST- Testing today at [facility]   Anesthesia review: no  Patient denies shortness of breath, fever, cough and chest pain at PAT appointment   All instructions explained to the patient, with a verbal understanding of the material. Patient agrees to go over the instructions while at home for a better understanding. Patient also instructed to self quarantine after being tested for COVID-19. The opportunity to ask questions was provided.    Coronavirus Screening  Have you experienced the following symptoms:  Cough yes/no: No Fever (>100.37F)  yes/no: No Runny nose yes/no: No Sore throat yes/no: No Difficulty breathing/shortness of breath  yes/no: No  Have you or a family member traveled in the last 14 days and where? yes/no: No   If the patient indicates "YES" to the above questions, their PAT will be rescheduled to limit the exposure to others and, the surgeon will be notified. THE PATIENT WILL NEED TO BE ASYMPTOMATIC FOR 14 DAYS.   If the patient is not experiencing any of these symptoms, the PAT nurse will instruct them to NOT bring anyone with them to their appointment since they may have these symptoms or traveled as well.   Please remind your patients and families that hospital visitation restrictions are in effect and the importance of the  restrictions. \

## 2020-01-30 ENCOUNTER — Other Ambulatory Visit: Payer: Self-pay

## 2020-01-30 ENCOUNTER — Ambulatory Visit (HOSPITAL_COMMUNITY)
Admission: RE | Admit: 2020-01-30 | Discharge: 2020-01-30 | Disposition: A | Discharge status: Home / Self Care | Payer: Medicare Other | Attending: Thoracic Surgery (Cardiothoracic Vascular Surgery) | Admitting: Thoracic Surgery (Cardiothoracic Vascular Surgery)

## 2020-01-30 ENCOUNTER — Other Ambulatory Visit: Payer: Self-pay | Admitting: *Deleted

## 2020-01-30 ENCOUNTER — Encounter (HOSPITAL_COMMUNITY)
Admission: RE | Disposition: A | Discharge status: Home / Self Care | Payer: Self-pay | Source: Home / Self Care | Attending: Thoracic Surgery (Cardiothoracic Vascular Surgery)

## 2020-01-30 ENCOUNTER — Ambulatory Visit (HOSPITAL_COMMUNITY): Payer: Medicare Other | Admitting: Certified Registered"

## 2020-01-30 ENCOUNTER — Ambulatory Visit (HOSPITAL_COMMUNITY): Payer: Medicare Other

## 2020-01-30 ENCOUNTER — Encounter (HOSPITAL_COMMUNITY): Payer: Self-pay | Admitting: Thoracic Surgery (Cardiothoracic Vascular Surgery)

## 2020-01-30 ENCOUNTER — Ambulatory Visit (HOSPITAL_COMMUNITY): Payer: Medicare Other | Admitting: Vascular Surgery

## 2020-01-30 DIAGNOSIS — E119 Type 2 diabetes mellitus without complications: Secondary | ICD-10-CM | POA: Insufficient documentation

## 2020-01-30 DIAGNOSIS — C7931 Secondary malignant neoplasm of brain: Secondary | ICD-10-CM

## 2020-01-30 DIAGNOSIS — E039 Hypothyroidism, unspecified: Secondary | ICD-10-CM | POA: Diagnosis not present

## 2020-01-30 DIAGNOSIS — C3491 Malignant neoplasm of unspecified part of right bronchus or lung: Secondary | ICD-10-CM | POA: Diagnosis not present

## 2020-01-30 DIAGNOSIS — Z7984 Long term (current) use of oral hypoglycemic drugs: Secondary | ICD-10-CM | POA: Diagnosis not present

## 2020-01-30 DIAGNOSIS — C3411 Malignant neoplasm of upper lobe, right bronchus or lung: Secondary | ICD-10-CM | POA: Insufficient documentation

## 2020-01-30 DIAGNOSIS — Z79890 Hormone replacement therapy (postmenopausal): Secondary | ICD-10-CM | POA: Diagnosis not present

## 2020-01-30 DIAGNOSIS — R590 Localized enlarged lymph nodes: Secondary | ICD-10-CM | POA: Diagnosis not present

## 2020-01-30 DIAGNOSIS — Z7982 Long term (current) use of aspirin: Secondary | ICD-10-CM | POA: Insufficient documentation

## 2020-01-30 DIAGNOSIS — Z87891 Personal history of nicotine dependence: Secondary | ICD-10-CM | POA: Insufficient documentation

## 2020-01-30 DIAGNOSIS — E785 Hyperlipidemia, unspecified: Secondary | ICD-10-CM | POA: Insufficient documentation

## 2020-01-30 DIAGNOSIS — I10 Essential (primary) hypertension: Secondary | ICD-10-CM | POA: Diagnosis not present

## 2020-01-30 DIAGNOSIS — Z79899 Other long term (current) drug therapy: Secondary | ICD-10-CM | POA: Diagnosis not present

## 2020-01-30 DIAGNOSIS — R918 Other nonspecific abnormal finding of lung field: Secondary | ICD-10-CM

## 2020-01-30 DIAGNOSIS — C771 Secondary and unspecified malignant neoplasm of intrathoracic lymph nodes: Secondary | ICD-10-CM | POA: Insufficient documentation

## 2020-01-30 DIAGNOSIS — Z419 Encounter for procedure for purposes other than remedying health state, unspecified: Secondary | ICD-10-CM

## 2020-01-30 HISTORY — PX: VIDEO BRONCHOSCOPY WITH ENDOBRONCHIAL ULTRASOUND: SHX6177

## 2020-01-30 LAB — GLUCOSE, CAPILLARY
Glucose-Capillary: 117 mg/dL — ABNORMAL HIGH (ref 70–99)
Glucose-Capillary: 145 mg/dL — ABNORMAL HIGH (ref 70–99)

## 2020-01-30 SURGERY — BRONCHOSCOPY, WITH EBUS
Anesthesia: General

## 2020-01-30 MED ORDER — LIDOCAINE 2% (20 MG/ML) 5 ML SYRINGE
INTRAMUSCULAR | Status: DC | PRN
  Administered 2020-01-30: 40 mg via INTRAVENOUS

## 2020-01-30 MED ORDER — LACTATED RINGERS IV SOLN: INTRAVENOUS | Status: DC

## 2020-01-30 MED ORDER — EPINEPHRINE PF 1 MG/ML IJ SOLN
INTRAMUSCULAR | Status: AC
  Filled 2020-01-30: qty 1

## 2020-01-30 MED ORDER — CHLORHEXIDINE GLUCONATE 0.12 % MT SOLN
OROMUCOSAL | Status: AC
  Administered 2020-01-30: 15 mL via OROMUCOSAL
  Filled 2020-01-30: qty 15

## 2020-01-30 MED ORDER — PHENYLEPHRINE HCL-NACL 10-0.9 MG/250ML-% IV SOLN
INTRAVENOUS | Status: DC | PRN
  Administered 2020-01-30: 25 ug/min via INTRAVENOUS

## 2020-01-30 MED ORDER — ORAL CARE MOUTH RINSE: 15.0000 mL | Freq: Once | OROMUCOSAL | Status: AC

## 2020-01-30 MED ORDER — MEPERIDINE HCL 25 MG/ML IJ SOLN: 6.2500 mg | INTRAMUSCULAR | Status: DC | PRN

## 2020-01-30 MED ORDER — PROPOFOL 10 MG/ML IV BOLUS
INTRAVENOUS | Status: AC
  Filled 2020-01-30: qty 20

## 2020-01-30 MED ORDER — AMISULPRIDE (ANTIEMETIC) 5 MG/2ML IV SOLN: 10.0000 mg | Freq: Once | INTRAVENOUS | Status: DC | PRN

## 2020-01-30 MED ORDER — ACETAMINOPHEN 160 MG/5ML PO SOLN: 325.0000 mg | Freq: Once | ORAL | Status: DC | PRN

## 2020-01-30 MED ORDER — ONDANSETRON HCL 4 MG/2ML IJ SOLN
INTRAMUSCULAR | Status: DC | PRN
  Administered 2020-01-30: 4 mg via INTRAVENOUS

## 2020-01-30 MED ORDER — FENTANYL CITRATE (PF) 100 MCG/2ML IJ SOLN
INTRAMUSCULAR | Status: DC | PRN
Start: 2020-01-30 — End: 2020-01-30
  Administered 2020-01-30: 100 ug via INTRAVENOUS
  Administered 2020-01-30: 25 ug via INTRAVENOUS

## 2020-01-30 MED ORDER — 0.9 % SODIUM CHLORIDE (POUR BTL) OPTIME
TOPICAL | Status: DC | PRN
  Administered 2020-01-30: 1000 mL

## 2020-01-30 MED ORDER — CHLORHEXIDINE GLUCONATE 0.12 % MT SOLN: 15.0000 mL | Freq: Once | OROMUCOSAL | Status: AC

## 2020-01-30 MED ORDER — FENTANYL CITRATE (PF) 100 MCG/2ML IJ SOLN: 25.0000 ug | INTRAMUSCULAR | Status: DC | PRN

## 2020-01-30 MED ORDER — ROCURONIUM BROMIDE 10 MG/ML (PF) SYRINGE
PREFILLED_SYRINGE | INTRAVENOUS | Status: DC | PRN
  Administered 2020-01-30 (×2): 20 mg via INTRAVENOUS
  Administered 2020-01-30: 60 mg via INTRAVENOUS

## 2020-01-30 MED ORDER — PHENYLEPHRINE 40 MCG/ML (10ML) SYRINGE FOR IV PUSH (FOR BLOOD PRESSURE SUPPORT)
PREFILLED_SYRINGE | INTRAVENOUS | Status: AC
  Filled 2020-01-30: qty 10

## 2020-01-30 MED ORDER — ACETAMINOPHEN 10 MG/ML IV SOLN: 1000.0000 mg | Freq: Once | INTRAVENOUS | Status: DC | PRN

## 2020-01-30 MED ORDER — PROPOFOL 10 MG/ML IV BOLUS
INTRAVENOUS | Status: DC | PRN
  Administered 2020-01-30: 200 mg via INTRAVENOUS

## 2020-01-30 MED ORDER — ACETAMINOPHEN 325 MG PO TABS: 325.0000 mg | ORAL_TABLET | Freq: Once | ORAL | Status: DC | PRN

## 2020-01-30 MED ORDER — MIDAZOLAM HCL 2 MG/2ML IJ SOLN
INTRAMUSCULAR | Status: AC
  Filled 2020-01-30: qty 2

## 2020-01-30 MED ORDER — EPINEPHRINE PF 1 MG/ML IJ SOLN
INTRAMUSCULAR | Status: DC | PRN
  Administered 2020-01-30: 1 mg via ENDOTRACHEOPULMONARY

## 2020-01-30 MED ORDER — FENTANYL CITRATE (PF) 250 MCG/5ML IJ SOLN
INTRAMUSCULAR | Status: AC
  Filled 2020-01-30: qty 5

## 2020-01-30 MED ORDER — SUGAMMADEX SODIUM 200 MG/2ML IV SOLN
INTRAVENOUS | Status: DC | PRN
  Administered 2020-01-30: 400 mg via INTRAVENOUS

## 2020-01-30 SURGICAL SUPPLY — 46 items
ADAPTER VALVE BIOPSY EBUS (MISCELLANEOUS) IMPLANT
ADPTR VALVE BIOPSY EBUS (MISCELLANEOUS)
BLADE CLIPPER SURG (BLADE) IMPLANT
BRUSH CYTOL CELLEBRITY 1.5X140 (MISCELLANEOUS) ×2 IMPLANT
BRUSH SUPERTRAX NDL-TIP CYTO (INSTRUMENTS) ×2 IMPLANT
CANISTER SUCT 3000ML PPV (MISCELLANEOUS) ×2 IMPLANT
CNTNR URN SCR LID CUP LEK RST (MISCELLANEOUS) ×2 IMPLANT
CONT SPEC 4OZ STRL OR WHT (MISCELLANEOUS) ×4
COVER BACK TABLE 60X90IN (DRAPES) ×2 IMPLANT
DRAPE HALF SHEET 40X57 (DRAPES) ×2 IMPLANT
FILTER STRAW FLUID ASPIR (MISCELLANEOUS) IMPLANT
FORCEPS BIOP RJ4 1.8 (CUTTING FORCEPS) ×2 IMPLANT
FORCEPS RADIAL JAW LRG 4 PULM (INSTRUMENTS) IMPLANT
GAUZE SPONGE 4X4 12PLY STRL (GAUZE/BANDAGES/DRESSINGS) ×2 IMPLANT
GLOVE BIOGEL PI IND STRL 6 (GLOVE) ×2 IMPLANT
GLOVE BIOGEL PI IND STRL 6.5 (GLOVE) ×1 IMPLANT
GLOVE BIOGEL PI INDICATOR 6 (GLOVE) ×2
GLOVE BIOGEL PI INDICATOR 6.5 (GLOVE) ×1
GLOVE SURG SIGNA 7.5 PF LTX (GLOVE) ×2 IMPLANT
GOWN STRL REUS W/ TWL LRG LVL3 (GOWN DISPOSABLE) ×2 IMPLANT
GOWN STRL REUS W/ TWL XL LVL3 (GOWN DISPOSABLE) ×1 IMPLANT
GOWN STRL REUS W/TWL LRG LVL3 (GOWN DISPOSABLE) ×4
GOWN STRL REUS W/TWL XL LVL3 (GOWN DISPOSABLE) ×2
KIT CLEAN ENDO COMPLIANCE (KITS) ×4 IMPLANT
KIT TURNOVER KIT B (KITS) ×2 IMPLANT
MARKER SKIN DUAL TIP RULER LAB (MISCELLANEOUS) ×4 IMPLANT
NEEDLE ASPIRATION VIZISHOT 19G (NEEDLE) ×2 IMPLANT
NEEDLE ASPIRATION VIZISHOT 21G (NEEDLE) IMPLANT
NEEDLE BLUNT 18X1 FOR OR ONLY (NEEDLE) IMPLANT
NS IRRIG 1000ML POUR BTL (IV SOLUTION) ×2 IMPLANT
OIL SILICONE PENTAX (PARTS (SERVICE/REPAIRS)) ×2 IMPLANT
PAD ARMBOARD 7.5X6 YLW CONV (MISCELLANEOUS) ×4 IMPLANT
RADIAL JAW LRG 4 PULMONARY (INSTRUMENTS)
SYR 20ML ECCENTRIC (SYRINGE) ×4 IMPLANT
SYR 20ML LL LF (SYRINGE) ×2 IMPLANT
SYR 3ML LL SCALE MARK (SYRINGE) IMPLANT
SYR 5ML LL (SYRINGE) ×2 IMPLANT
SYR 5ML LUER SLIP (SYRINGE) ×2 IMPLANT
TOWEL GREEN STERILE (TOWEL DISPOSABLE) ×2 IMPLANT
TOWEL GREEN STERILE FF (TOWEL DISPOSABLE) ×2 IMPLANT
TRAP SPECIMEN MUCUS 40CC (MISCELLANEOUS) ×2 IMPLANT
TUBE CONNECTING 20X1/4 (TUBING) ×2 IMPLANT
VALVE BIOPSY  SINGLE USE (MISCELLANEOUS) ×2
VALVE BIOPSY SINGLE USE (MISCELLANEOUS) ×1 IMPLANT
VALVE SUCTION BRONCHIO DISP (MISCELLANEOUS) ×2 IMPLANT
WATER STERILE IRR 1000ML POUR (IV SOLUTION) ×2 IMPLANT

## 2020-01-30 NOTE — Anesthesia Preprocedure Evaluation (Addendum)
Anesthesia Evaluation  Patient identified by MRN, date of birth, ID band Patient awake    Reviewed: Allergy & Precautions, NPO status , Patient's Chart, lab work & pertinent test results  Airway Mallampati: III  TM Distance: >3 FB     Dental  (+) Teeth Intact, Dental Advisory Given, Caps   Pulmonary former smoker,    breath sounds clear to auscultation       Cardiovascular hypertension,  Rhythm:Regular Rate:Normal     Neuro/Psych  Headaches, PSYCHIATRIC DISORDERS Depression    GI/Hepatic negative GI ROS, Neg liver ROS,   Endo/Other  diabetes, Type 2, Oral Hypoglycemic AgentsHypothyroidism   Renal/GU      Musculoskeletal  (+) Arthritis ,   Abdominal Normal abdominal exam  (+)   Peds  Hematology   Anesthesia Other Findings   Reproductive/Obstetrics                            Anesthesia Physical Anesthesia Plan  ASA: II  Anesthesia Plan: General   Post-op Pain Management:    Induction: Intravenous  PONV Risk Score and Plan: 3 and Ondansetron and Treatment may vary due to age or medical condition  Airway Management Planned: Oral ETT  Additional Equipment: None  Intra-op Plan:   Post-operative Plan: Extubation in OR  Informed Consent: I have reviewed the patients History and Physical, chart, labs and discussed the procedure including the risks, benefits and alternatives for the proposed anesthesia with the patient or authorized representative who has indicated his/her understanding and acceptance.     Dental advisory given  Plan Discussed with: CRNA  Anesthesia Plan Comments:        Anesthesia Quick Evaluation

## 2020-01-30 NOTE — Brief Op Note (Signed)
01/30/2020  9:43 AM  PATIENT:  [name]  74 y.o. male  PRE-OPERATIVE DIAGNOSIS:  RIGHT UPPER LOBE MASS MEDIASTINAL ADENOPATHY  POST-OPERATIVE DIAGNOSIS:  NON-SMALL CELL CARCINOMA RIGHT UPPER LOBE MEDIASTINAL ADENOPATHY  PROCEDURE:  Procedure(s): VIDEO BRONCHOSCOPY WITH ENDOBRONCHIAL ULTRASOUND (N/A) Brushings, endobronchial and transbronchial biopsies  SURGEON:  Surgeon(s) and Role:    * Melrose Nakayama, MD - Primary  PHYSICIAN ASSISTANT:   ASSISTANTS: none   ANESTHESIA:   general  EBL: minimal  BLOOD ADMINISTERED:none  DRAINS: none   LOCAL MEDICATIONS USED:  NONE  SPECIMEN:  Source of Specimen:  4R and 7 nodes, RUL mass  DISPOSITION OF SPECIMEN:  PATHOLOGY  COUNTS:  NO endo  TOURNIQUET:  * No tourniquets in log *  DICTATION: .Other Dictation: Dictation Number -  PLAN OF CARE: Discharge to home after PACU  PATIENT DISPOSITION:  PACU - hemodynamically stable.   Delay start of Pharmacological VTE agent (>24hrs) due to surgical blood loss or risk of bleeding: not applicable

## 2020-01-30 NOTE — Anesthesia Postprocedure Evaluation (Signed)
Anesthesia Post Note  Patient: Billy Hall  Procedure(s) Performed: VIDEO BRONCHOSCOPY WITH ENDOBRONCHIAL ULTRASOUND (N/A )     Patient location during evaluation: PACU Anesthesia Type: General Level of consciousness: awake and alert Pain management: pain level controlled Vital Signs Assessment: post-procedure vital signs reviewed and stable Respiratory status: spontaneous breathing, nonlabored ventilation, respiratory function stable and patient connected to nasal cannula oxygen Cardiovascular status: blood pressure returned to baseline and stable Postop Assessment: no apparent nausea or vomiting Anesthetic complications: no   No complications documented.  Last Vitals:  Vitals:   01/30/20 1000 01/30/20 1015  BP: 138/79 133/81  Pulse: 83 82  Resp: 20 19  Temp:  (!) 36.3 C  SpO2: 95% 95%    Last Pain:  Vitals:   01/30/20 1015  TempSrc:   PainSc: 0-No pain                 Effie Berkshire

## 2020-01-30 NOTE — Interval H&P Note (Signed)
History and Physical Interval Note:  01/30/2020 7:23 AM  Billy Hall  has presented today for surgery, with the diagnosis of RUL MASS MEDIASTINAL ADENOPATHY.  The various methods of treatment have been discussed with the patient and family. After consideration of risks, benefits and other options for treatment, the patient has consented to  Procedure(s): [place] (N/A) as a surgical intervention.  The patient's history has been reviewed, patient examined, no change in status, stable for surgery.  I have reviewed the patient's chart and labs.  Questions were answered to the patient's satisfaction.     Melrose Nakayama

## 2020-01-30 NOTE — Discharge Instructions (Signed)
Do not drive or engage in heavy physical activity for 24 hours  You may resume normal activities tomorrow  You may cough up small amounts of blood over the next few days.  Call [phone] if you develop chest pain, shortness of breath or cough up more than 2 tablespoons of blood  You may use acetaminophen (Tylenol) if needed for discomfort.  You may use an over the counter cough medication if needed  My office will contact you with follow up information

## 2020-01-30 NOTE — Op Note (Signed)
NAME: Billy Hall, ADERMAN MEDICAL RECORD MA:[id number] ACCOUNT [id number] DATE OF BIRTH:1945/10/24 FACILITY: [facility] LOCATION: [facility] PHYSICIAN:Fabienne Nolasco Chaya Jan, MD  OPERATIVE REPORT  DATE OF PROCEDURE:  01/30/2020  PREOPERATIVE DIAGNOSIS:  Right upper lobe mass with possible mediastinal adenopathy.  POSTOPERATIVE DIAGNOSIS:  Nonsmall cell carcinoma right upper lobe with questionable mediastinal adenopathy.  PROCEDURE:   1.  Endobronchial ultrasound with mediastinal lymph node aspirations.   2.  Bronchoscopy with brushings,endobronchial and transbronchial biopsies.  SURGEON:  Modesto Charon, MD  ASSISTANT:  None.  ANESTHESIA:  General.  FINDINGS:  Thick secretions bilaterally.  Extrinsic compression of apical segmental bronchus.  Brushings showed nonsmall cell carcinoma.  Lymph node aspirations indeterminate.  CLINICAL NOTE:  The patient is a 74 year old man with a history of tobacco abuse who presented with hemoptysis.  He was found to have a right upper lobe lung mass with some mild paratracheal adenopathy.  He was advised to undergo bronchoscopy and  endobronchial ultrasound for diagnosis and staging purposes.  The indications, risks, benefits, and alternatives were discussed in detail with the patient.  He understood and accepted the risks and agreed to proceed.  OPERATIVE NOTE:  Mr. Duval was brought to the operating room on 01/30/2020.  He had induction of general anesthesia and was intubated.  A timeout was performed.  Flexible fiberoptic bronchoscopy was performed via the endotracheal tube.  It revealed  normal endobronchial anatomy with the exception of the right upper lobe.  There was extrinsic compression of the apical segmental right upper lobe bronchus.  No endobronchial tumor was seen, but there was edema in the mucosa at the segmental carina.  There were thick clear secretions bilaterally.   These were irrigated with saline.  The endobronchial  ultrasound probe was advanced.  There was a relatively large node in the right paratracheal 4R location.  Aspirations were performed of this node, both with and without suction applied.  It was difficult to get a good window into this  node and difficult to visualize with ultrasound  when the needle was advanced.  All of these were being examined with quick prep.  Additional samples were taken from a small level 7 node.  No other adenopathy was apparent with the endobronchial  ultrasound scope.  The quick preps on those were indeterminate.  The bronchoscope was reinserted and directed to the right upper lobe orifice.  Brushings then were obtained from the apical segmental bronchus.  Initial brushings were taken right at the orifice and additional brushings were performed deeper into the  lung.  Fluoroscopy was used to visualize the instruments with all of the sampling of the right upper lobe lesion.  Multiple biopsies then were obtained.  There was some bleeding with both the brushings and the biopsies and dilute epinephrine was applied  topically to help with the bleeding.  The quick prep on the brushings showed adequate specimen and nonsmall cell carcinoma.  All of the biopsies were sent for permanent pathology.  Multiple additional biopsies were obtained.  Additional epinephrine was  instilled and the bronchoscope was removed.  After 5 minutes, the bronchoscope was reinserted.  There was no ongoing active bleeding.  The bronchoscope was removed.  The patient was extubated in the operating room and taken to the [facility] in good condition.  The total fluoroscopy time was 2 minutes and the dose was 31 milligray.  VN/NUANCE  D:01/30/2020 T:01/30/2020 JOB:[id number]

## 2020-01-30 NOTE — Transfer of Care (Signed)
Immediate Anesthesia Transfer of Care Note  Patient: Billy Hall  Procedure(s) Performed: VIDEO BRONCHOSCOPY WITH ENDOBRONCHIAL ULTRASOUND (N/A )  Patient Location: PACU  Anesthesia Type:General  Level of Consciousness: awake, alert , oriented and patient cooperative  Airway & Oxygen Therapy: Patient connected to face mask oxygen  Post-op Assessment: Post -op Vital signs reviewed and stable  Post vital signs: stable  Last Vitals:  Vitals Value Taken Time  BP    Temp    Pulse    Resp    SpO2      Last Pain:  Vitals:   01/30/20 0640  TempSrc:   PainSc: 0-No pain         Complications: No complications documented.

## 2020-01-30 NOTE — Anesthesia Procedure Notes (Signed)
Procedure Name: Intubation Date/Time: 01/30/2020 7:42 AM Performed by: Lavell Luster, CRNA Pre-anesthesia Checklist: Patient identified, Emergency Drugs available, Suction available, Patient being monitored and Timeout performed Patient Re-evaluated:Patient Re-evaluated prior to induction Oxygen Delivery Method: Circle system utilized Preoxygenation: Pre-oxygenation with 100% oxygen Induction Type: IV induction Ventilation: Mask ventilation without difficulty Laryngoscope Size: Mac, 4 and Glidescope Grade View: Grade I Tube type: Oral Tube size: 8.5 mm Number of attempts: 1 Airway Equipment and Method: Stylet and Video-laryngoscopy Placement Confirmation: ETT inserted through vocal cords under direct vision,  positive ETCO2 and breath sounds checked- equal and bilateral Secured at: 21 cm Tube secured with: Tape Dental Injury: Teeth and Oropharynx as per pre-operative assessment  Difficulty Due To: Difficulty was anticipated and Difficult Airway- due to dentition

## 2020-01-31 ENCOUNTER — Other Ambulatory Visit: Payer: Self-pay

## 2020-01-31 ENCOUNTER — Ambulatory Visit (HOSPITAL_COMMUNITY)
Admission: RE | Admit: 2020-01-31 | Discharge: 2020-01-31 | Disposition: A | Discharge status: Home / Self Care | Payer: Medicare Other | Source: Ambulatory Visit | Attending: Thoracic Surgery (Cardiothoracic Vascular Surgery) | Admitting: Thoracic Surgery (Cardiothoracic Vascular Surgery)

## 2020-01-31 ENCOUNTER — Encounter (HOSPITAL_COMMUNITY): Payer: Self-pay | Admitting: Thoracic Surgery (Cardiothoracic Vascular Surgery)

## 2020-01-31 DIAGNOSIS — R918 Other nonspecific abnormal finding of lung field: Secondary | ICD-10-CM | POA: Insufficient documentation

## 2020-01-31 LAB — PULMONARY FUNCTION TEST
DL/VA % pred: 137 %
DL/VA: 5.5 ml/min/mmHg/L
DLCO cor % pred: 64 %
DLCO cor: 16.09 ml/min/mmHg
DLCO unc % pred: 63 %
DLCO unc: 15.86 ml/min/mmHg
FEF 25-75 Post: 0.77 L/sec
FEF 25-75 Pre: 1.31 L/sec
FEF2575-%Change-Post: -41 %
FEF2575-%Pred-Post: 33 %
FEF2575-%Pred-Pre: 56 %
FEV1-%Change-Post: -28 %
FEV1-%Pred-Post: 41 %
FEV1-%Pred-Pre: 57 %
FEV1-Post: 1.14 L
FEV1-Pre: 1.59 L
FEV1FVC-%Change-Post: -26 %
FEV1FVC-%Pred-Pre: 98 %
FEV6-%Change-Post: -2 %
FEV6-%Pred-Post: 59 %
FEV6-%Pred-Pre: 60 %
FEV6-Post: 2.08 L
FEV6-Pre: 2.13 L
FEV6FVC-%Change-Post: 0 %
FEV6FVC-%Pred-Post: 105 %
FEV6FVC-%Pred-Pre: 105 %
FVC-%Change-Post: -2 %
FVC-%Pred-Post: 56 %
FVC-%Pred-Pre: 57 %
FVC-Post: 2.09 L
FVC-Pre: 2.14 L
Post FEV1/FVC ratio: 55 %
Post FEV6/FVC ratio: 100 %
Pre FEV1/FVC ratio: 74 %
Pre FEV6/FVC Ratio: 100 %
RV % pred: 60 %
RV: 1.5 L
TLC % pred: 51 %
TLC: 3.58 L

## 2020-01-31 MED ORDER — ALBUTEROL SULFATE (2.5 MG/3ML) 0.083% IN NEBU
2.5000 mg | INHALATION_SOLUTION | Freq: Once | RESPIRATORY_TRACT | Status: AC
  Administered 2020-01-31: 2.5 mg via RESPIRATORY_TRACT

## 2020-02-01 LAB — SURGICAL PATHOLOGY

## 2020-02-01 LAB — CYTOLOGY - NON PAP

## 2020-02-03 ENCOUNTER — Other Ambulatory Visit: Payer: Self-pay

## 2020-02-03 ENCOUNTER — Ambulatory Visit
Admission: RE | Admit: 2020-02-03 | Discharge: 2020-02-03 | Disposition: A | Discharge status: Home / Self Care | Payer: Medicare Other | Source: Ambulatory Visit | Attending: Thoracic Surgery (Cardiothoracic Vascular Surgery) | Admitting: Thoracic Surgery (Cardiothoracic Vascular Surgery)

## 2020-02-03 DIAGNOSIS — R930 Abnormal findings on diagnostic imaging of skull and head, not elsewhere classified: Secondary | ICD-10-CM | POA: Diagnosis not present

## 2020-02-03 DIAGNOSIS — I6782 Cerebral ischemia: Secondary | ICD-10-CM | POA: Diagnosis not present

## 2020-02-03 DIAGNOSIS — I7090 Unspecified atherosclerosis: Secondary | ICD-10-CM | POA: Diagnosis not present

## 2020-02-03 DIAGNOSIS — G9389 Other specified disorders of brain: Secondary | ICD-10-CM | POA: Diagnosis not present

## 2020-02-03 DIAGNOSIS — R918 Other nonspecific abnormal finding of lung field: Secondary | ICD-10-CM

## 2020-02-03 DIAGNOSIS — C7931 Secondary malignant neoplasm of brain: Secondary | ICD-10-CM

## 2020-02-03 MED ORDER — IOPAMIDOL (ISOVUE-300) INJECTION 61%
75.0000 mL | Freq: Once | INTRAVENOUS | Status: AC | PRN
  Administered 2020-02-03: 75 mL via INTRAVENOUS

## 2020-02-06 ENCOUNTER — Other Ambulatory Visit: Payer: Self-pay

## 2020-02-06 ENCOUNTER — Ambulatory Visit (HOSPITAL_COMMUNITY)
Admission: RE | Admit: 2020-02-06 | Discharge: 2020-02-06 | Disposition: A | Discharge status: Home / Self Care | Payer: Medicare Other | Source: Ambulatory Visit | Attending: Thoracic Surgery (Cardiothoracic Vascular Surgery) | Admitting: Thoracic Surgery (Cardiothoracic Vascular Surgery)

## 2020-02-06 DIAGNOSIS — N200 Calculus of kidney: Secondary | ICD-10-CM | POA: Diagnosis not present

## 2020-02-06 DIAGNOSIS — R918 Other nonspecific abnormal finding of lung field: Secondary | ICD-10-CM | POA: Diagnosis not present

## 2020-02-06 DIAGNOSIS — K118 Other diseases of salivary glands: Secondary | ICD-10-CM | POA: Insufficient documentation

## 2020-02-06 DIAGNOSIS — I700 Atherosclerosis of aorta: Secondary | ICD-10-CM | POA: Diagnosis not present

## 2020-02-06 DIAGNOSIS — Z01818 Encounter for other preprocedural examination: Secondary | ICD-10-CM | POA: Diagnosis not present

## 2020-02-06 DIAGNOSIS — K4020 Bilateral inguinal hernia, without obstruction or gangrene, not specified as recurrent: Secondary | ICD-10-CM | POA: Diagnosis not present

## 2020-02-06 LAB — GLUCOSE, CAPILLARY: Glucose-Capillary: 112 mg/dL — ABNORMAL HIGH (ref 70–99)

## 2020-02-06 MED ORDER — FLUDEOXYGLUCOSE F - 18 (FDG) INJECTION
12.8000 | Freq: Once | INTRAVENOUS | Status: AC
  Administered 2020-02-06: 12.8 via INTRAVENOUS

## 2020-02-07 ENCOUNTER — Encounter: Payer: Self-pay | Admitting: Thoracic Surgery (Cardiothoracic Vascular Surgery)

## 2020-02-07 ENCOUNTER — Telehealth: Payer: Self-pay | Admitting: *Deleted

## 2020-02-07 ENCOUNTER — Other Ambulatory Visit: Payer: Self-pay | Admitting: Thoracic Surgery (Cardiothoracic Vascular Surgery)

## 2020-02-07 ENCOUNTER — Ambulatory Visit: Payer: Medicare Other | Admitting: Thoracic Surgery (Cardiothoracic Vascular Surgery)

## 2020-02-07 VITALS — BP 138/80 | HR 90 | Temp 97.9°F | Resp 20 | Ht 69.5 in | Wt 258.0 lb

## 2020-02-07 DIAGNOSIS — R918 Other nonspecific abnormal finding of lung field: Secondary | ICD-10-CM

## 2020-02-07 DIAGNOSIS — Z09 Encounter for follow-up examination after completed treatment for conditions other than malignant neoplasm: Secondary | ICD-10-CM

## 2020-02-07 DIAGNOSIS — K118 Other diseases of salivary glands: Secondary | ICD-10-CM

## 2020-02-07 DIAGNOSIS — R590 Localized enlarged lymph nodes: Secondary | ICD-10-CM | POA: Diagnosis not present

## 2020-02-07 DIAGNOSIS — C3491 Malignant neoplasm of unspecified part of right bronchus or lung: Secondary | ICD-10-CM | POA: Diagnosis not present

## 2020-02-07 NOTE — Telephone Encounter (Signed)
I received a call from Billy Hall.  I updated him on his appt to be seen at [facility] next week.  He verbalized understanding of appt time and place.

## 2020-02-07 NOTE — Telephone Encounter (Signed)
I received referral on Mr. Billy Hall today. I called to update on appt but could not reach.  I was also unable to leave a vm message.

## 2020-02-07 NOTE — Progress Notes (Signed)
[address]       [place] [address]             [phone]     HPI: Billy Hall returns to discuss the results of his bronchoscopy and PET CT.  Billy Hall is a 74 year old man with a history of hypertension, hyperlipidemia, type 2 diabetes, depression, hypothyroidism, and remote tobacco abuse (quit in 2004).  He presented with hemoptysis in September.  He was treated for presumed pneumonia, but given his history he had a CT of the chest.  He was found to have a 6 x 10 cm right upper lobe mass with some mild paratracheal adenopathy.  I did a bronchoscopy and endobronchial ultrasound on 01/30/2020.  The right upper lobe mass and paratracheal node both were positive for squamous cell carcinoma.  Level 7 nodes were negative although it was a poor sample.  In the meantime he had a PET/CT and a CT of the brain and also had pulmonary function testing.  He does have a little bit of a sore or scratchy throat since the procedure.  He does still continue to have small amounts of hemoptysis.  Past Medical History:  Diagnosis Date  . Arthritis   . Depressive disorder   . Diabetes mellitus without complication ([place])   . Fatigue   . Headache(784.0)   . Hyperlipidemia   . Hypertension   . Hypothyroidism   . Snoring     Current Outpatient Medications  Medication Sig Dispense Refill  . acetaminophen (TYLENOL 8 HOUR) 650 MG CR tablet Take 650 mg by mouth every 8 (eight) hours as needed for pain.    Marland Kitchen aspirin EC 81 MG tablet Take 81 mg by mouth daily.    . cholecalciferol (VITAMIN D3) 25 MCG (1000 UNIT) tablet Take 1,000 Units by mouth daily.    Marland Kitchen levothyroxine (SYNTHROID) 88 MCG tablet Take 88 mcg by mouth daily before breakfast.     . losartan (COZAAR) 50 MG tablet Take 50 mg by mouth daily.     . metFORMIN (GLUCOPHAGE) 500 MG tablet Take 500 mg by mouth 2 (two) times daily with a meal.     . sildenafil (REVATIO) 20 MG tablet Take 20 mg by mouth daily as needed  (ED).     . simvastatin (ZOCOR) 10 MG tablet Take 10 mg by mouth every evening.     . tamsulosin (FLOMAX) 0.4 MG CAPS capsule Take 0.4 mg by mouth daily.     No current facility-administered medications for this visit.    Physical Exam BP 138/80   Pulse 90   Temp 97.9 F (36.6 C) (Skin)   Resp 20   Ht 5' 9.5" (1.765 m)   Wt 258 lb (117 kg)   SpO2 95% Comment: RA with mask on  BMI 37.79 kg/m  74 year old man in no acute distress Alert and oriented x3 with no focal deficits Lungs equal bilaterally  Diagnostic Tests: NUCLEAR MEDICINE PET SKULL BASE TO THIGH  TECHNIQUE: 12.8 mCi F-18 FDG was injected intravenously. Full-ring PET imaging was performed from the skull base to thigh after the radiotracer. CT data was obtained and used for attenuation correction and anatomic localization.  Fasting blood glucose: 112 mg/dl  COMPARISON:  CT chest of 01/09/2020  FINDINGS: Mediastinal blood pool activity: SUV max 2.02  Liver activity: SUV max NA  NECK: Area of increased FDG uptake LEFT parotid measuring approximately 1.3 cm but without definite CT correlate no adenopathy in  the neck  Incidental CT findings: none  CHEST: Large RIGHT upper lobe mass extends to the pleural surface in the peripheral RIGHT chest measuring 9.8 x 7.0 cm, associated with more volume loss in the RIGHT upper lobe when compared to the prior study. (SUVmax = twenty-seven)  No hypermetabolic lymph nodes in the chest. Scattered small lymph nodes throughout the chest without pathologic enlargement or increased metabolism.  Mass margins with extrapleural fat are indistinct soft tissue may abut the RIGHT first rib.  Incidental CT findings: none  ABDOMEN/PELVIS: No abnormal hypermetabolic activity within the liver, pancreas, adrenal glands, or spleen. No hypermetabolic lymph nodes in the abdomen or pelvis. Mildly increased metabolic activity associated with the tip of the penis with some  gas surrounding the tip of the penis and soft tissue that suggests this patient may be uncircumcised. Findings are nonspecific and not well evaluated the current exam  Incidental CT findings: Nephrolithiasis in the lower pole the RIGHT kidney. Low-density RIGHT renal lesion arises from the upper pole smoothly marginated in likely a cyst, without increased metabolic activity. Nephrolithiasis in the lower pole the LEFT kidney, renal calculi approximately 3 mm bilaterally. No acute gastrointestinal process colonic diverticulosis. Normal appendix. Calcified atheromatous plaque of the abdominal aorta without aneurysmal dilation. Heterogeneous prostate, nonspecific. Small fat containing bilateral inguinal hernias.  SKELETON: No focal hypermetabolic activity to suggest skeletal metastasis.  Incidental CT findings: Spinal degenerative changes. No acute or destructive bone findings.  IMPRESSION: 1. Markedly hypermetabolic RIGHT upper lobe mass that extends to the visceral pleura and may extend into extrapleural fat of the RIGHT lung apex abutting the RIGHT first rib no mediastinal lymphadenopathy. 2. LEFT parotid lesion not well evaluated on the CT due to streak artifact but with focal area of increased metabolic activity in the LEFT parotid gland. This may represent primary parotid lesion but is incompletely characterized, suggest focused ultrasound or CT of the neck with contrast for further evaluation. 3. Signs of RIGHT hemithyroidectomy. 4. Mild uptake near the tip of the penis in an area that suggests the patient may be be uncircumcised. This may represent inflammation, correlate with any history of phimosis and with direct clinical inspection to exclude lesion in this location. 5. Nephrolithiasis and atherosclerosis as described.   Electronically Signed   By: Zetta Bills M.D.   On: 02/06/2020 13:49 CT HEAD WITHOUT AND WITH CONTRAST  TECHNIQUE: Contiguous axial  images were obtained from the base of the skull through the vertex without and with intravenous contrast  CONTRAST:  10mL ISOVUE-300 IOPAMIDOL (ISOVUE-300) INJECTION 61%  COMPARISON:  None.  FINDINGS: Brain: There is no acute intracranial hemorrhage, mass, mass effect, or edema. No abnormal enhancement. Gray-white differentiation is preserved. There is no extra-axial fluid collection. Prominence of the ventricles and sulci reflects generalized parenchymal volume loss. Patchy and confluent areas of hypoattenuation in the supratentorial white matter nonspecific but may reflect moderate chronic microvascular ischemic changes.  Vascular: There is atherosclerotic calcification at the skull base.  Skull: Calvarium is unremarkable.  Sinuses/Orbits: No acute finding.  Other: None.  IMPRESSION: No evidence of intracranial metastatic disease.  Chronic microvascular ischemic changes.   Electronically Signed   By: Macy Mis M.D.   On: 02/03/2020 12:36 I personally reviewed the PET/CT images and concur with the findings noted above.  Impression: Billy Hall is a 74 year old man with a history of hypertension, hyperlipidemia, type 2 diabetes, depression, hypothyroidism, and remote tobacco abuse (quit in 2004).  He presented with a cough and hemoptysis.  He was found to have a right upper lobe lung mass.  I did a navigational bronchoscopy and endobronchial ultrasound which showed it to be a squamous cell carcinoma.  He had a clinical stage T4, N0, IIIA tumor.  Pathologic stage by EBUS is T4, N2, IIIB.  He does appear to have resectable disease.  There is no evidence of brain metastases.  I discussed the results with Billy Hall.  Given that he has stage IIIb disease we need to go up with a multidisciplinary approach.  I am going arrange for him to be seen in our multidisciplinary thoracic oncology clinic to meet with oncology and radiation oncology so that we can come  up with a plan for his treatment.  Plan:  We will schedule an appointment with our multidisciplinary thoracic oncology clinic to discuss with oncology and radiation oncology best options for treatment. Arrange ultrasound-guided needle aspiration of left parotid lesion seen on PET  Billy Nakayama, MD [facility] [phone]

## 2020-02-08 ENCOUNTER — Telehealth: Payer: Self-pay

## 2020-02-08 NOTE — Progress Notes (Signed)
Thoracic Location of Tumor / Histology:  LUNG, RIGHT UPPER LOBE, BIOPSY:  - Squamous cell carcinoma. Patient presented one month ago with symptoms of: presumed pneumonia in September with hemoptysis CT of chest 6 x 10 cm mass right upper lobe mild trachea adenopathy  Biopsies ofmass (if applicable) revealed: squamous cell carcinoma  Tobacco/Marijuana/Snuff/ETOH use: quit 2004  Past/Anticipated interventions by cardiothoracic surgery, if any: multi-disciplinary team consult  Past/Anticipated interventions by medical oncology, if any: speaks on 21st with Dr Inda Merlin  Signs/Symptoms  Weight changes, if any: no  Respiratory complaints, if any: no  Hemoptysis, if any: just a little bit occasionally  Pain issues, if any:  none  SAFETY ISSUES:  Prior radiation? none  Pacemaker/ICD? no  Possible current pregnancy?male  Is the patient on methotrexate? no  Current Complaints / other details:  Patient to see Dr Inda Merlin on the 21st to discuss chemotherapy. Denies any pain states he occasionally coughs up blood

## 2020-02-08 NOTE — Telephone Encounter (Signed)
FMLA form completed for son Billy Hall ) Intermittent from 01/31/2020 through 07/31/20. Faxed to [facility] @ [phone] [employer] dept/ original form mailed to home address

## 2020-02-09 ENCOUNTER — Other Ambulatory Visit: Payer: Self-pay | Admitting: Thoracic Surgery (Cardiothoracic Vascular Surgery)

## 2020-02-09 ENCOUNTER — Ambulatory Visit
Admission: RE | Admit: 2020-02-09 | Discharge: 2020-02-09 | Disposition: A | Discharge status: Home / Self Care | Payer: Medicare Other | Source: Ambulatory Visit | Attending: Radiation Oncology | Admitting: Radiation Oncology

## 2020-02-09 ENCOUNTER — Encounter: Payer: Self-pay | Admitting: Radiation Oncology

## 2020-02-09 ENCOUNTER — Other Ambulatory Visit: Payer: Self-pay

## 2020-02-09 ENCOUNTER — Encounter (HOSPITAL_COMMUNITY): Payer: Self-pay | Admitting: Radiology

## 2020-02-09 DIAGNOSIS — C3411 Malignant neoplasm of upper lobe, right bronchus or lung: Secondary | ICD-10-CM | POA: Diagnosis not present

## 2020-02-09 DIAGNOSIS — Z87891 Personal history of nicotine dependence: Secondary | ICD-10-CM | POA: Diagnosis not present

## 2020-02-09 DIAGNOSIS — R590 Localized enlarged lymph nodes: Secondary | ICD-10-CM | POA: Diagnosis not present

## 2020-02-09 DIAGNOSIS — K118 Other diseases of salivary glands: Secondary | ICD-10-CM

## 2020-02-09 DIAGNOSIS — C3490 Malignant neoplasm of unspecified part of unspecified bronchus or lung: Secondary | ICD-10-CM

## 2020-02-09 MED ORDER — LORAZEPAM 0.5 MG PO TABS: ORAL_TABLET | ORAL | 0 refills | Status: DC

## 2020-02-09 NOTE — Patient Instructions (Signed)
Coronavirus (COVID-19) Are you at risk?  Are you at risk for the Coronavirus (COVID-19)?  To be considered HIGH RISK for Coronavirus (COVID-19), you have to meet the following criteria:  . Traveled to [place], [place], [place], [place] or [place]; or in the [place] to [place], [place], [place], or [place]; and have fever, cough, and shortness of breath within the last 2 weeks of travel OR . Been in close contact with a person diagnosed with COVID-19 within the last 2 weeks and have fever, cough, and shortness of breath . IF YOU DO NOT MEET THESE CRITERIA, YOU ARE CONSIDERED LOW RISK FOR COVID-19.  What to do if you are HIGH RISK for COVID-19?  . If you are having a medical emergency, call 911. . Seek medical care right away. Before you go to a doctor's office, urgent care or emergency department, call ahead and tell them about your recent travel, contact with someone diagnosed with COVID-19, and your symptoms. You should receive instructions from your physician's office regarding next steps of care.  . When you arrive at healthcare provider, tell the healthcare staff immediately you have returned from visiting [place], [place], [place], [place] or [place]; or traveled in the [place] to [place], [place], [place], or [place]; in the last two weeks or you have been in close contact with a person diagnosed with COVID-19 in the last 2 weeks.   . Tell the health care staff about your symptoms: fever, cough and shortness of breath. . After you have been seen by a medical provider, you will be either: o Tested for (COVID-19) and discharged home on quarantine except to seek medical care if symptoms worsen, and asked to  - Stay home and avoid contact with others until you get your results (4-5 days)  - Avoid travel on public transportation if possible (such as bus, train, or airplane) or o Sent to the Emergency Department by EMS for evaluation, COVID-19 testing, and possible  admission depending on your condition and test results.  What to do if you are LOW RISK for COVID-19?  Reduce your risk of any infection by using the same precautions used for avoiding the common cold or flu:  . Wash your hands often with soap and warm water for at least 20 seconds.  If soap and water are not readily available, use an alcohol-based hand sanitizer with at least 60% alcohol.  . If coughing or sneezing, cover your mouth and nose by coughing or sneezing into the elbow areas of your shirt or coat, into a tissue or into your sleeve (not your hands). . Avoid shaking hands with others and consider head nods or verbal greetings only. . Avoid touching your eyes, nose, or mouth with unwashed hands.  . Avoid close contact with people who are sick. . Avoid places or events with large numbers of people in one location, like concerts or sporting events. . Carefully consider travel plans you have or are making. . If you are planning any travel outside or inside the [place], visit the CDC's Travelers' Health webpage for the latest health notices. . If you have some symptoms but not all symptoms, continue to monitor at home and seek medical attention if your symptoms worsen. . If you are having a medical emergency, call 911.   ADDITIONAL HEALTHCARE OPTIONS FOR PATIENTS  [facility] / e-Visit: [email or web address]         [facility]: [phone]  [facility]: [phone]                   [facility]: [phone]   

## 2020-02-09 NOTE — Progress Notes (Addendum)
Radiation Oncology         [phone] ________________________________  Initial Outpatient Consultation - Conducted via telephone due to current COVID-19 concerns for limiting patient exposure  I spoke with the patient to conduct this consult visit via telephone to spare the patient unnecessary potential exposure in the healthcare setting during the current COVID-19 pandemic. The patient was notified in advance and was offered a [place] meeting to allow for face to face communication but unfortunately reported that they did not have the appropriate resources/technology to support such a visit and instead preferred to proceed with a telephone consult.   Name: Billy Hall        MRN: [id number]  Date of Service: 02/09/2020 DOB: 18-Feb-1946  PP:IRJJOAC, Billy Reichmann, MD  Billy Hall, *     REFERRING PHYSICIAN: Melrose Hall, *   DIAGNOSIS: The encounter diagnosis was Malignant neoplasm of upper lobe of right lung ([place]).   HISTORY OF PRESENT ILLNESS: Billy Hall is a 74 y.o. male seen at the request of Dr. Roxan Hall for a new diagnosis of right lung cancer.  The patient is a former smoker and quit smoking in 2004.  He developed a cough at the beginning of September and had hemoptysis within his sputum.  He was treated by his primary provider for pneumonia and a CT scan of the chest was performed given his tobacco use in the past and revealed a peribronchovascular/perihilar mass in the right upper lobe measuring 10 x 6 x 3.5 cm.  Enlarging right hilar lymph nodes were identified measuring up to 12 mm and prominent but not enlarged 8 mm right paratracheal lymphadenopathy was noted.  No other pulmonary disease was identified but there was some pleural changes, described as subpleural micronodule in the left lower lobe.  He was counseled on the rationale for bronchoscopy and underwent this procedure with Dr. Roxan Hall on 01/30/2020, final pathology revealed squamous cell  carcinoma in the right upper lobe biopsy, cytology at the 4R node revealed malignant cells consistent with squamous cell carcinoma, a level 7 node that was nondiagnostic, and right upper lobe brushings were again consistent with squamous cell carcinoma.  He has undergone a CT scan of the head with and without contrast revealing no evidence of intracranial disease.  PET scan on 02/06/2020 revealed markedly hypermetabolic activity in the right upper lobe extending to the pleural surface in the peripheral right chest measuring 9.8 x 7 cm with an SUV max of 27.  No hypermetabolic nodes were seen in the chest but scattered small nodes throughout the chest without pathologic enlargement or increased metabolism were noted.  There was an area of left parotid uptake with a lesion measuring 1.3 cm but no definite CT correlate or adenopathy in the neck.  There were mass margins with extrapleural fat along the right first rib.  There is mildly increased activity at the tip of the penis with some gas surrounding the tip and soft tissue that was felt to represent uncircumcised anatomy.  He also had a low-density right renal lesion arising from the upper pole that had smooth margins without any increased activity felt to be consistent with a cyst, kidney stones in the lower pole of the left kidney as well as right kidney the largest measuring 3 mm.  No focal hypermetabolic activity within the skeletal system was noted.  With these findings he is seen today to discuss the options of chemoradiation, he is planning to meet with Dr. Julien Hall and we will  see him next Thursday.   PREVIOUS RADIATION THERAPY: No   PAST MEDICAL HISTORY:  Past Medical History:  Diagnosis Date  . Arthritis   . Depressive disorder   . Diabetes mellitus without complication ([place])   . Fatigue   . Headache(784.0)   . Hyperlipidemia   . Hypertension   . Hypothyroidism   . Snoring        PAST SURGICAL HISTORY: Past Surgical History:    Procedure Laterality Date  . COLONOSCOPY N/A 03/08/2013   Procedure: COLONOSCOPY;  Surgeon: Billy So, MD;  Location: [facility];  Service: Gastroenterology;  Laterality: N/A;  . KNEE ARTHROSCOPY Right    07/06/2004 By Dr. Luna Hall at [facility].   . THYROID SURGERY    . VIDEO BRONCHOSCOPY WITH ENDOBRONCHIAL ULTRASOUND N/A 01/30/2020   Procedure: VIDEO BRONCHOSCOPY WITH ENDOBRONCHIAL ULTRASOUND;  Surgeon: Billy Nakayama, MD;  Location: [facility] OR;  Service: Thoracic;  Laterality: N/A;     FAMILY HISTORY:  Family History  Problem Relation Age of Onset  . Alzheimer's disease Mother      SOCIAL HISTORY:  reports that he has quit smoking. He has never used smokeless tobacco. He reports previous alcohol use. He reports that he does not use drugs. The patient is retired from driving a Forensic scientist and lives in [place]. His wife passed away in 2018-07-07. His adult son lives in [place].   ALLERGIES: Ace inhibitors   MEDICATIONS:  Current Outpatient Medications  Medication Sig Dispense Refill  . acetaminophen (TYLENOL 8 HOUR) 650 MG CR tablet Take 650 mg by mouth every 8 (eight) hours as needed for pain.    Marland Kitchen aspirin EC 81 MG tablet Take 81 mg by mouth daily.    . cholecalciferol (VITAMIN D3) 25 MCG (1000 UNIT) tablet Take 1,000 Units by mouth daily.    Marland Kitchen levothyroxine (SYNTHROID) 88 MCG tablet Take 88 mcg by mouth daily before breakfast.     . losartan (COZAAR) 50 MG tablet Take 50 mg by mouth daily.     . metFORMIN (GLUCOPHAGE) 500 MG tablet Take 500 mg by mouth 2 (two) times daily with a meal.     . sildenafil (REVATIO) 20 MG tablet Take 20 mg by mouth daily as needed (ED).     . simvastatin (ZOCOR) 10 MG tablet Take 10 mg by mouth every evening.     . tamsulosin (FLOMAX) 0.4 MG CAPS capsule Take 0.4 mg by mouth daily.     No current facility-administered medications for this encounter.     REVIEW OF SYSTEMS: On review of systems, the patient reports that he is doing well overall. He  reports he has stable shortness of breath which is chronic for him. He has had persistent hemoptysis with cough that happens some days, but has not increased in volume in the last few weeks. He denies fevers, chills, night sweats, unintended weight changes. He denies any bowel or bladder disturbances, and denies abdominal pain, nausea or vomiting. He denies any new musculoskeletal or joint aches or pains. A complete review of systems is obtained and is otherwise negative.     PHYSICAL EXAM:  Unable to assess due to encounter type.   ECOG = 1  0 - Asymptomatic (Fully active, able to carry on all predisease activities without restriction)  1 - Symptomatic but completely ambulatory (Restricted in physically strenuous activity but ambulatory and able to carry out work of a light or sedentary nature. For example, light housework, office work)  2 - Symptomatic, <  50% in bed during the day (Ambulatory and capable of all self care but unable to carry out any work activities. Up and about more than 50% of waking hours)  3 - Symptomatic, >50% in bed, but not bedbound (Capable of only limited self-care, confined to bed or chair 50% or more of waking hours)  4 - Bedbound (Completely disabled. Cannot carry on any self-care. Totally confined to bed or chair)  5 - Death   Eustace Pen MM, Creech RH, Tormey DC, et al. [phone]). "Toxicity and response criteria of the [facility]". [place] Oncol. 5 (6): 649-55    LABORATORY DATA:  Lab Results  Component Value Date   WBC 8.2 01/27/2020   HGB 14.1 01/27/2020   HCT 44.4 01/27/2020   MCV 82.7 01/27/2020   PLT 247 01/27/2020   Lab Results  Component Value Date   NA 137 01/27/2020   K 4.1 01/27/2020   CL 100 01/27/2020   CO2 26 01/27/2020   Lab Results  Component Value Date   ALT 13 01/27/2020   AST 14 (L) 01/27/2020   ALKPHOS 58 01/27/2020   BILITOT 0.8 01/27/2020      RADIOGRAPHY: DG Chest 2 View  Result Date:  01/27/2020 CLINICAL DATA:  Preop exam Mediastinal adenopathy, mass of right upper lobe of lung Patient reports to have bronchoscopy 01/30/2020. Denies any sob or chest pains. Reports slight cough with congestion, but is typical for him. Hx of diabetes, htn. Quit smoking 2004. EXAM: CHEST - 2 VIEW COMPARISON:  Chest x-ray 04/05/2009, CT chest 01/09/2020. FINDINGS: The heart size and mediastinal contours are within normal limits. Urine trace shin of the right hemidiaphragm. Almost complete opacification of the right upper lobe consistent with known mass and likely associated atelectasis. Redemonstration of associated right upper lobe atelectasis/scarring. No focal consolidation within the remaining lungs. No pulmonary edema. No pleural effusion. No pneumothorax. No acute osseous abnormality. Multilevel degenerative changes of the spine. IMPRESSION: 1. Right upper lobe mass with likely associated atelectasis. 2. No new cardiopulmonary finding. Electronically Signed   By: Iven Finn M.D.   On: 01/27/2020 23:09   CT Head W Wo Contrast  Result Date: 02/03/2020 CLINICAL DATA:  Lung mass EXAM: CT HEAD WITHOUT AND WITH CONTRAST TECHNIQUE: Contiguous axial images were obtained from the base of the skull through the vertex without and with intravenous contrast CONTRAST:  95mL ISOVUE-300 IOPAMIDOL (ISOVUE-300) INJECTION 61% COMPARISON:  None. FINDINGS: Brain: There is no acute intracranial hemorrhage, mass, mass effect, or edema. No abnormal enhancement. Gray-white differentiation is preserved. There is no extra-axial fluid collection. Prominence of the ventricles and sulci reflects generalized parenchymal volume loss. Patchy and confluent areas of hypoattenuation in the supratentorial white matter nonspecific but may reflect moderate chronic microvascular ischemic changes. Vascular: There is atherosclerotic calcification at the skull base. Skull: Calvarium is unremarkable. Sinuses/Orbits: No acute finding. Other: None.  IMPRESSION: No evidence of intracranial metastatic disease. Chronic microvascular ischemic changes. Electronically Signed   By: Macy Mis M.D.   On: 02/03/2020 12:36   NM PET Image Initial (PI) Skull Base To Thigh  Result Date: 02/06/2020 CLINICAL DATA:  Initial treatment strategy for RIGHT lung mass. EXAM: NUCLEAR MEDICINE PET SKULL BASE TO THIGH TECHNIQUE: 12.8 mCi F-18 FDG was injected intravenously. Full-ring PET imaging was performed from the skull base to thigh after the radiotracer. CT data was obtained and used for attenuation correction and anatomic localization. Fasting blood glucose: 112 mg/dl COMPARISON:  CT chest of 01/09/2020 FINDINGS:  Mediastinal blood pool activity: SUV max 2.02 Liver activity: SUV max NA NECK: Area of increased FDG uptake LEFT parotid measuring approximately 1.3 cm but without definite CT correlate no adenopathy in the neck Incidental CT findings: none CHEST: Large RIGHT upper lobe mass extends to the pleural surface in the peripheral RIGHT chest measuring 9.8 x 7.0 cm, associated with more volume loss in the RIGHT upper lobe when compared to the prior study. (SUVmax = twenty-seven) No hypermetabolic lymph nodes in the chest. Scattered small lymph nodes throughout the chest without pathologic enlargement or increased metabolism. Mass margins with extrapleural fat are indistinct soft tissue may abut the RIGHT first rib. Incidental CT findings: none ABDOMEN/PELVIS: No abnormal hypermetabolic activity within the liver, pancreas, adrenal glands, or spleen. No hypermetabolic lymph nodes in the abdomen or pelvis. Mildly increased metabolic activity associated with the tip of the penis with some gas surrounding the tip of the penis and soft tissue that suggests this patient may be uncircumcised. Findings are nonspecific and not well evaluated the current exam Incidental CT findings: Nephrolithiasis in the lower pole the RIGHT kidney. Low-density RIGHT renal lesion arises from the  upper pole smoothly marginated in likely a cyst, without increased metabolic activity. Nephrolithiasis in the lower pole the LEFT kidney, renal calculi approximately 3 mm bilaterally. No acute gastrointestinal process colonic diverticulosis. Normal appendix. Calcified atheromatous plaque of the abdominal aorta without aneurysmal dilation. Heterogeneous prostate, nonspecific. Small fat containing bilateral inguinal hernias. SKELETON: No focal hypermetabolic activity to suggest skeletal metastasis. Incidental CT findings: Spinal degenerative changes. No acute or destructive bone findings. IMPRESSION: 1. Markedly hypermetabolic RIGHT upper lobe mass that extends to the visceral pleura and may extend into extrapleural fat of the RIGHT lung apex abutting the RIGHT first rib no mediastinal lymphadenopathy. 2. LEFT parotid lesion not well evaluated on the CT due to streak artifact but with focal area of increased metabolic activity in the LEFT parotid gland. This may represent primary parotid lesion but is incompletely characterized, suggest focused ultrasound or CT of the neck with contrast for further evaluation. 3. Signs of RIGHT hemithyroidectomy. 4. Mild uptake near the tip of the penis in an area that suggests the patient may be be uncircumcised. This may represent inflammation, correlate with any history of phimosis and with direct clinical inspection to exclude lesion in this location. 5. Nephrolithiasis and atherosclerosis as described. Electronically Signed   By: Zetta Bills M.D.   On: 02/06/2020 13:49   DG C-ARM BRONCHOSCOPY  Result Date: 01/30/2020 C-ARM BRONCHOSCOPY: Fluoroscopy was utilized by the requesting physician.  No radiographic interpretation.       IMPRESSION/PLAN: 1. Stage IIIB, cT4N2M0, NSCLC, squamous cell carcinoma of the RUL. Dr. Lisbeth Renshaw discusses the pathology findings and reviews the nature of locally advanced lung cancer.  For treatment Dr. Lisbeth Renshaw discusses the rationale for  chemoradiation. Dr. Lisbeth Renshaw also recommends an MRI to rule out brain disease. He has had claustrophobia which is why the CT was ordered rather than MRI, but the patient is willing to try an MRI with medication.  He will meet with Dr. Julien Hall next week.  We discussed the risks, benefits, short, and long term effects of radiotherapy, and the patient is interested in proceeding. Dr. Lisbeth Renshaw discusses the delivery and logistics of radiotherapy and anticipates a course of 6-1/2 weeks of radiotherapy.  He will come on 02/14/20 for simulation at which time he will sign written consent to proceed. 2. Claustrophobia. The patient is willing to try an MRI scan and  I'll send in a prescription to his pharmacy for Ativan to help him reduce claustrophobia. He is aware of the side effect profile and need for someone to drive him when taking this medication.   Given current concerns for patient exposure during the COVID-19 pandemic, this encounter was conducted via telephone.  The patient has provided two factor identification and has given verbal consent for this type of encounter and has been advised to only accept a meeting of this type in a secure network environment. The time spent during this encounter was 60 minutes including preparation, discussion, and coordination of the patient's care. The attendants for this meeting include  Dr. Lisbeth Renshaw, Hayden Pedro  and [name].  During the encounter, Dr. Lisbeth Renshaw, and Hayden Pedro were located at [facility] Radiation Oncology Department.  ERRIK MITCHELLE was located at home.     The above documentation reflects my direct findings during this shared patient visit. Please see the separate note by Dr. Lisbeth Renshaw on this date for the remainder of the patient's plan of care.    Carola Rhine, PAC

## 2020-02-09 NOTE — Progress Notes (Signed)
Billy Hall. Billy Hall Male, 74 y.o., 1945-07-09 MRN:  [id number] Phone:  [phone] (H) PCP:  [name], MD Coverage:  [place] Medicare Next Appt With Radiation Oncology 02/14/2020 at 3:00 PM  RE: STAT: [place] FNA SALIVARY GLAND/PAROTID GLAND Received: Today Suttle, Rosanne Ashing, MD  [name] D Difficult to visualize lesion on CT. Recommend ultrasound of parotid gland prior to scheduling.   [name]       Previous Messages   ----- Message -----  From: [name] D  Sent: 02/07/2020  2:39 PM EDT  To: Ir Procedure Requests  Subject: STAT: [place] FNA SALIVARY GLAND/PAROTID GLAND    Procedure: [place] FNA SALIVARY GLAND/PAROTID GLAND   Reason: Parotid gland fullness, needle aspiration of left parotid gland lesion seen on PET   History: NM PET, CT in computer   Provider: [place] Charon C   Provider Contact: [phone]

## 2020-02-13 ENCOUNTER — Encounter: Payer: Self-pay | Admitting: Radiation Oncology

## 2020-02-13 ENCOUNTER — Telehealth: Payer: Self-pay | Admitting: *Deleted

## 2020-02-13 NOTE — Telephone Encounter (Signed)
Called patient to inform that Dr. Lisbeth Renshaw wants him to do an MRI, and that a script has been sent to his drugstore, patient still insists that he doesn't want to do an MRI, notified Shona Simpson

## 2020-02-13 NOTE — Telephone Encounter (Signed)
Called patient to inform of MRI for 02-15-20 - arrival time- 6:30 am @ [facility] MRI, no restrictions, spoke with patient and he is declining to do this MRI, notified Shona Simpson

## 2020-02-13 NOTE — Progress Notes (Addendum)
Despite counseling about MRI being the best modality for brain imaging to rule out disease, and offering antianxiety medication to avoid claustrophobia, the patient declined to schedule his MRI. I called Psychiatrist to cancel his ativan rx.

## 2020-02-14 ENCOUNTER — Ambulatory Visit
Admission: RE | Admit: 2020-02-14 | Discharge: 2020-02-14 | Disposition: A | Discharge status: Home / Self Care | Payer: Medicare Other | Source: Ambulatory Visit | Attending: Radiation Oncology | Admitting: Radiation Oncology

## 2020-02-14 DIAGNOSIS — C3411 Malignant neoplasm of upper lobe, right bronchus or lung: Secondary | ICD-10-CM | POA: Diagnosis not present

## 2020-02-14 DIAGNOSIS — Z510 Encounter for antineoplastic radiation therapy: Secondary | ICD-10-CM | POA: Diagnosis not present

## 2020-02-14 DIAGNOSIS — Z87891 Personal history of nicotine dependence: Secondary | ICD-10-CM | POA: Diagnosis not present

## 2020-02-14 DIAGNOSIS — R590 Localized enlarged lymph nodes: Secondary | ICD-10-CM | POA: Diagnosis not present

## 2020-02-15 ENCOUNTER — Ambulatory Visit (HOSPITAL_COMMUNITY): Payer: Medicare Other

## 2020-02-16 ENCOUNTER — Inpatient Hospital Stay: Payer: Medicare Other

## 2020-02-16 ENCOUNTER — Other Ambulatory Visit: Payer: Self-pay

## 2020-02-16 ENCOUNTER — Inpatient Hospital Stay: Payer: Medicare Other | Attending: Internal Medicine | Admitting: Internal Medicine

## 2020-02-16 ENCOUNTER — Telehealth: Payer: Self-pay | Admitting: *Deleted

## 2020-02-16 ENCOUNTER — Other Ambulatory Visit: Payer: Self-pay | Admitting: *Deleted

## 2020-02-16 ENCOUNTER — Other Ambulatory Visit: Payer: Medicare Other

## 2020-02-16 ENCOUNTER — Encounter: Payer: Self-pay | Admitting: *Deleted

## 2020-02-16 ENCOUNTER — Encounter: Payer: Self-pay | Admitting: Internal Medicine

## 2020-02-16 VITALS — BP 136/69 | HR 82 | Temp 97.8°F | Resp 18 | Ht 69.5 in | Wt 256.9 lb

## 2020-02-16 DIAGNOSIS — K4020 Bilateral inguinal hernia, without obstruction or gangrene, not specified as recurrent: Secondary | ICD-10-CM | POA: Diagnosis not present

## 2020-02-16 DIAGNOSIS — M1990 Unspecified osteoarthritis, unspecified site: Secondary | ICD-10-CM

## 2020-02-16 DIAGNOSIS — E119 Type 2 diabetes mellitus without complications: Secondary | ICD-10-CM | POA: Insufficient documentation

## 2020-02-16 DIAGNOSIS — E785 Hyperlipidemia, unspecified: Secondary | ICD-10-CM | POA: Diagnosis not present

## 2020-02-16 DIAGNOSIS — Z87891 Personal history of nicotine dependence: Secondary | ICD-10-CM

## 2020-02-16 DIAGNOSIS — C3411 Malignant neoplasm of upper lobe, right bronchus or lung: Secondary | ICD-10-CM | POA: Diagnosis not present

## 2020-02-16 DIAGNOSIS — R042 Hemoptysis: Secondary | ICD-10-CM | POA: Diagnosis not present

## 2020-02-16 DIAGNOSIS — R918 Other nonspecific abnormal finding of lung field: Secondary | ICD-10-CM

## 2020-02-16 DIAGNOSIS — R059 Cough, unspecified: Secondary | ICD-10-CM | POA: Diagnosis not present

## 2020-02-16 DIAGNOSIS — K118 Other diseases of salivary glands: Secondary | ICD-10-CM

## 2020-02-16 DIAGNOSIS — N200 Calculus of kidney: Secondary | ICD-10-CM | POA: Diagnosis not present

## 2020-02-16 DIAGNOSIS — Z79899 Other long term (current) drug therapy: Secondary | ICD-10-CM | POA: Diagnosis not present

## 2020-02-16 DIAGNOSIS — I10 Essential (primary) hypertension: Secondary | ICD-10-CM | POA: Insufficient documentation

## 2020-02-16 DIAGNOSIS — E039 Hypothyroidism, unspecified: Secondary | ICD-10-CM | POA: Insufficient documentation

## 2020-02-16 DIAGNOSIS — I6782 Cerebral ischemia: Secondary | ICD-10-CM | POA: Diagnosis not present

## 2020-02-16 DIAGNOSIS — R5383 Other fatigue: Secondary | ICD-10-CM | POA: Diagnosis not present

## 2020-02-16 DIAGNOSIS — Z818 Family history of other mental and behavioral disorders: Secondary | ICD-10-CM | POA: Diagnosis not present

## 2020-02-16 DIAGNOSIS — Z7189 Other specified counseling: Secondary | ICD-10-CM | POA: Insufficient documentation

## 2020-02-16 DIAGNOSIS — Z5111 Encounter for antineoplastic chemotherapy: Secondary | ICD-10-CM | POA: Insufficient documentation

## 2020-02-16 LAB — CBC WITH DIFFERENTIAL (CANCER CENTER ONLY)
Abs Immature Granulocytes: 0.03 10*3/uL (ref 0.00–0.07)
Basophils Absolute: 0.1 10*3/uL (ref 0.0–0.1)
Basophils Relative: 1 %
Eosinophils Absolute: 0.1 10*3/uL (ref 0.0–0.5)
Eosinophils Relative: 2 %
HCT: 42.3 % (ref 39.0–52.0)
Hemoglobin: 13.5 g/dL (ref 13.0–17.0)
Immature Granulocytes: 0 %
Lymphocytes Relative: 20 %
Lymphs Abs: 1.9 10*3/uL (ref 0.7–4.0)
MCH: 25.7 pg — ABNORMAL LOW (ref 26.0–34.0)
MCHC: 31.9 g/dL (ref 30.0–36.0)
MCV: 80.6 fL (ref 80.0–100.0)
Monocytes Absolute: 0.8 10*3/uL (ref 0.1–1.0)
Monocytes Relative: 9 %
Neutro Abs: 6.5 10*3/uL (ref 1.7–7.7)
Neutrophils Relative %: 68 %
Platelet Count: 271 10*3/uL (ref 150–400)
RBC: 5.25 MIL/uL (ref 4.22–5.81)
RDW: 15.5 % (ref 11.5–15.5)
WBC Count: 9.5 10*3/uL (ref 4.0–10.5)
nRBC: 0 % (ref 0.0–0.2)

## 2020-02-16 LAB — CMP (CANCER CENTER ONLY)
ALT: 9 U/L (ref 0–44)
AST: 12 U/L — ABNORMAL LOW (ref 15–41)
Albumin: 3.3 g/dL — ABNORMAL LOW (ref 3.5–5.0)
Alkaline Phosphatase: 67 U/L (ref 38–126)
Anion gap: 5 (ref 5–15)
BUN: 12 mg/dL (ref 8–23)
CO2: 31 mmol/L (ref 22–32)
Calcium: 9.8 mg/dL (ref 8.9–10.3)
Chloride: 103 mmol/L (ref 98–111)
Creatinine: 0.86 mg/dL (ref 0.61–1.24)
GFR, Estimated: >60 mL/min (ref >60)
Glucose, Bld: 87 mg/dL (ref 70–99)
Potassium: 4.4 mmol/L (ref 3.5–5.1)
Sodium: 139 mmol/L (ref 135–145)
Total Bilirubin: 0.4 mg/dL (ref 0.3–1.2)
Total Protein: 7.5 g/dL (ref 6.5–8.1)

## 2020-02-16 NOTE — Progress Notes (Signed)
START ON PATHWAY REGIMEN - Non-Small Cell Lung     Administer weekly:     Paclitaxel      Carboplatin   **Always confirm dose/schedule in your pharmacy ordering system**  Patient Characteristics: Preoperative or Nonsurgical Candidate (Clinical Staging), Stage III - Nonsurgical Candidate (Nonsquamous and Squamous), PS = 0, 1 Therapeutic Status: Preoperative or Nonsurgical Candidate (Clinical Staging) AJCC T Category: cT4 AJCC N Category: cN0 AJCC M Category: cM0 AJCC 8 Stage Grouping: IIIA ECOG Performance Status: 1 Intent of Therapy: Curative Intent, Discussed with Patient

## 2020-02-16 NOTE — Progress Notes (Signed)
The proposed treatment discussed in cancer conference 02/16/20 is for discussion purpose only and is not a binding recommendation. The patient was not physically examined nor present for their treatment options.  Therefore, final treatment plans cannot be decided.

## 2020-02-16 NOTE — Addendum Note (Signed)
Encounter addended by: Kyung Rudd, MD on: 02/16/2020 7:17 AM  Actions taken: Edit attestation on clinical note

## 2020-02-16 NOTE — Progress Notes (Signed)
[place] [phone]   [phone] Multidisciplinary thoracic oncology clinic  CONSULT NOTE  REFERRING PHYSICIAN: Dr. Modesto Charon  REASON FOR CONSULTATION:  74 years old white male recently diagnosed with lung cancer.  HPI Billy Hall is a 74 y.o. male with past medical history significant for hypertension diabetes mellitus, dyslipidemia, hypothyroidism, depression as well as osteoarthritis and long history for smoking but quit in 2004.  The patient mentions that he has been complaining of cough for few weeks and then it became productive of blood-tinged sputum.  He was seen by his primary care physician and CT scan of the chest was performed on 01/09/2020 and it showed interval development of a right upper lobe perihilar 10.0 x 6.0 x 3.5 cm mass with an associated enlarged 1.2 cm right hilar lymph node.  There was associated right apical patchy groundglass airspace opacity suspicious for a combination of postobstructive atelectasis and pneumonitis.  The patient was referred to Dr. Roxan Hockey and on January 30, 2020 he underwent bronchoscopy with brushing, endobronchial and transbronchial biopsies as well as endobronchial ultrasound with mediastinal lymph node aspirations.  The final pathology ([id number]) was consistent with squamous cell carcinoma. Immunohistochemical stains show that the tumor cells are positive for p40 and CK 5/6; while they are negative for TTF-1.  The patient had CT scan of the head with and without contrast on February 03, 2020 that showed no evidence of metastatic disease to the brain.  He also had a PET scan on February 06, 2020 and it showed markedly hypermetabolic right upper lobe mass that extends to the visceral pleura and may extend into the extrapleural fat of the right lung apex abutting the right first rib with no mediastinal lymphadenopathy.  There was also a left parotid lesion.  He had ultrasound of the soft  tissue head and neck that was negative for any suspicious lesion in the parotid glands. The patient was referred to the multidisciplinary thoracic oncology clinic today for evaluation and recommendation regarding treatment of his condition. He was already seen by Dr. Lisbeth Renshaw and expected to start radiotherapy next week. When seen today he is feeling fine with no concerning complaints except for the persistent cough with blood-tinged sputum.  He denied having any chest pain, shortness of breath.  He denied having any weight loss or night sweats.  He has no nausea, vomiting, diarrhea or constipation.  He has no headache or visual changes. Family history significant for mother with Alzheimer.  Sister had lung cancer and father with unknown medical history. The patient is a widow and has 1 son.  Is currently retired.  He is accompanied by a friend Billy Hall.  He has a history of smoking 1 pack/day for around 50 years and quit in 2004.  He also has a history of alcohol abuse in the past but not recently and no history of drug abuse.   HPI  Past Medical History:  Diagnosis Date  . Arthritis   . Depressive disorder   . Diabetes mellitus without complication ([place])   . Fatigue   . Headache(784.0)   . Hyperlipidemia   . Hypertension   . Hypothyroidism   . Snoring     Past Surgical History:  Procedure Laterality Date  . COLONOSCOPY N/A 03/08/2013   Procedure: COLONOSCOPY;  Surgeon: Jamesetta So, MD;  Location: [facility];  Service: Gastroenterology;  Laterality: N/A;  . KNEE ARTHROSCOPY Right    2006 By Dr. Luna Glasgow at [facility]   Marland Kitchen  THYROID SURGERY    . VIDEO BRONCHOSCOPY WITH ENDOBRONCHIAL ULTRASOUND N/A 01/30/2020   Procedure: VIDEO BRONCHOSCOPY WITH ENDOBRONCHIAL ULTRASOUND;  Surgeon: Melrose Nakayama, MD;  Location: [facility] OR;  Service: Thoracic;  Laterality: N/A;    Family History  Problem Relation Age of Onset  . Alzheimer's disease Mother     Social History Social History    Tobacco Use  . Smoking status: Former Research scientist (life sciences)  . Smokeless tobacco: Never Used  . Tobacco comment: Quit 2004  Vaping Use  . Vaping Use: Never used  Substance Use Topics  . Alcohol use: Not Currently    Alcohol/week: 0.0 standard drinks    Comment: has quit since 2001  . Drug use: No    Allergies  Allergen Reactions  . Ace Inhibitors Swelling and Rash    Current Outpatient Medications  Medication Sig Dispense Refill  . acetaminophen (TYLENOL 8 HOUR) 650 MG CR tablet Take 650 mg by mouth every 8 (eight) hours as needed for pain.    Marland Kitchen aspirin EC 81 MG tablet Take 81 mg by mouth daily.    . cholecalciferol (VITAMIN D3) 25 MCG (1000 UNIT) tablet Take 1,000 Units by mouth daily.    Marland Kitchen levothyroxine (SYNTHROID) 88 MCG tablet Take 88 mcg by mouth daily before breakfast.     . losartan (COZAAR) 50 MG tablet Take 50 mg by mouth daily.     . metFORMIN (GLUCOPHAGE) 500 MG tablet Take 500 mg by mouth 2 (two) times daily with a meal.     . sildenafil (REVATIO) 20 MG tablet Take 20 mg by mouth daily as needed (ED).     . simvastatin (ZOCOR) 10 MG tablet Take 10 mg by mouth every evening.     . tamsulosin (FLOMAX) 0.4 MG CAPS capsule Take 0.4 mg by mouth daily.     No current facility-administered medications for this visit.    Review of Systems  Constitutional: positive for fatigue Eyes: negative Ears, nose, mouth, throat, and face: negative Respiratory: positive for cough and sputum Cardiovascular: negative Gastrointestinal: negative Genitourinary:negative Integument/breast: negative Hematologic/lymphatic: negative Musculoskeletal:negative Neurological: negative Behavioral/Psych: negative Endocrine: negative Allergic/Immunologic: negative  Physical Exam  JJH:ERDEY, healthy, no distress, well nourished, well developed and anxious SKIN: skin color, texture, turgor are normal, no rashes or significant lesions HEAD: Normocephalic, No masses, lesions, tenderness or  abnormalities EYES: normal, PERRLA, Conjunctiva are pink and non-injected EARS: External ears normal, Canals clear OROPHARYNX:no exudate, no erythema and lips, buccal mucosa, and tongue normal  NECK: supple, no adenopathy, no JVD LYMPH:  no palpable lymphadenopathy, no hepatosplenomegaly LUNGS: coarse sounds heard, decreased breath sounds HEART: regular rate & rhythm, no murmurs and no gallops ABDOMEN:abdomen soft, non-tender, normal bowel sounds and no masses or organomegaly BACK: Back symmetric, no curvature., No CVA tenderness EXTREMITIES:no joint deformities, effusion, or inflammation, no edema  NEURO: alert & oriented x 3 with fluent speech, no focal motor/sensory deficits  PERFORMANCE STATUS: ECOG 1  LABORATORY DATA: Lab Results  Component Value Date   WBC 9.5 02/16/2020   HGB 13.5 02/16/2020   HCT 42.3 02/16/2020   MCV 80.6 02/16/2020   PLT 271 02/16/2020      Chemistry      Component Value Date/Time   NA 137 01/27/2020 0913   K 4.1 01/27/2020 0913   CL 100 01/27/2020 0913   CO2 26 01/27/2020 0913   BUN 10 01/27/2020 0913   CREATININE 0.83 01/27/2020 0913      Component Value Date/Time   CALCIUM 9.2  01/27/2020 0913   ALKPHOS 58 01/27/2020 0913   AST 14 (L) 01/27/2020 0913   ALT 13 01/27/2020 0913   BILITOT 0.8 01/27/2020 0913       RADIOGRAPHIC STUDIES: DG Chest 2 View  Result Date: 01/27/2020 CLINICAL DATA:  Preop exam Mediastinal adenopathy, mass of right upper lobe of lung Patient reports to have bronchoscopy 01/30/2020. Denies any sob or chest pains. Reports slight cough with congestion, but is typical for him. Hx of diabetes, htn. Quit smoking 2004. EXAM: CHEST - 2 VIEW COMPARISON:  Chest x-ray 04/05/2009, CT chest 01/09/2020. FINDINGS: The heart size and mediastinal contours are within normal limits. Urine trace shin of the right hemidiaphragm. Almost complete opacification of the right upper lobe consistent with known mass and likely associated  atelectasis. Redemonstration of associated right upper lobe atelectasis/scarring. No focal consolidation within the remaining lungs. No pulmonary edema. No pleural effusion. No pneumothorax. No acute osseous abnormality. Multilevel degenerative changes of the spine. IMPRESSION: 1. Right upper lobe mass with likely associated atelectasis. 2. No new cardiopulmonary finding. Electronically Signed   By: Iven Finn M.D.   On: 01/27/2020 23:09   CT Head W Wo Contrast  Result Date: 02/03/2020 CLINICAL DATA:  Lung mass EXAM: CT HEAD WITHOUT AND WITH CONTRAST TECHNIQUE: Contiguous axial images were obtained from the base of the skull through the vertex without and with intravenous contrast CONTRAST:  39mL ISOVUE-300 IOPAMIDOL (ISOVUE-300) INJECTION 61% COMPARISON:  None. FINDINGS: Brain: There is no acute intracranial hemorrhage, mass, mass effect, or edema. No abnormal enhancement. Gray-white differentiation is preserved. There is no extra-axial fluid collection. Prominence of the ventricles and sulci reflects generalized parenchymal volume loss. Patchy and confluent areas of hypoattenuation in the supratentorial white matter nonspecific but may reflect moderate chronic microvascular ischemic changes. Vascular: There is atherosclerotic calcification at the skull base. Skull: Calvarium is unremarkable. Sinuses/Orbits: No acute finding. Other: None. IMPRESSION: No evidence of intracranial metastatic disease. Chronic microvascular ischemic changes. Electronically Signed   By: Macy Mis M.D.   On: 02/03/2020 12:36   NM PET Image Initial (PI) Skull Base To Thigh  Result Date: 02/06/2020 CLINICAL DATA:  Initial treatment strategy for RIGHT lung mass. EXAM: NUCLEAR MEDICINE PET SKULL BASE TO THIGH TECHNIQUE: 12.8 mCi F-18 FDG was injected intravenously. Full-ring PET imaging was performed from the skull base to thigh after the radiotracer. CT data was obtained and used for attenuation correction and anatomic  localization. Fasting blood glucose: 112 mg/dl COMPARISON:  CT chest of 01/09/2020 FINDINGS: Mediastinal blood pool activity: SUV max 2.02 Liver activity: SUV max NA NECK: Area of increased FDG uptake LEFT parotid measuring approximately 1.3 cm but without definite CT correlate no adenopathy in the neck Incidental CT findings: none CHEST: Large RIGHT upper lobe mass extends to the pleural surface in the peripheral RIGHT chest measuring 9.8 x 7.0 cm, associated with more volume loss in the RIGHT upper lobe when compared to the prior study. (SUVmax = twenty-seven) No hypermetabolic lymph nodes in the chest. Scattered small lymph nodes throughout the chest without pathologic enlargement or increased metabolism. Mass margins with extrapleural fat are indistinct soft tissue may abut the RIGHT first rib. Incidental CT findings: none ABDOMEN/PELVIS: No abnormal hypermetabolic activity within the liver, pancreas, adrenal glands, or spleen. No hypermetabolic lymph nodes in the abdomen or pelvis. Mildly increased metabolic activity associated with the tip of the penis with some gas surrounding the tip of the penis and soft tissue that suggests this patient may be uncircumcised. Findings  are nonspecific and not well evaluated the current exam Incidental CT findings: Nephrolithiasis in the lower pole the RIGHT kidney. Low-density RIGHT renal lesion arises from the upper pole smoothly marginated in likely a cyst, without increased metabolic activity. Nephrolithiasis in the lower pole the LEFT kidney, renal calculi approximately 3 mm bilaterally. No acute gastrointestinal process colonic diverticulosis. Normal appendix. Calcified atheromatous plaque of the abdominal aorta without aneurysmal dilation. Heterogeneous prostate, nonspecific. Small fat containing bilateral inguinal hernias. SKELETON: No focal hypermetabolic activity to suggest skeletal metastasis. Incidental CT findings: Spinal degenerative changes. No acute or  destructive bone findings. IMPRESSION: 1. Markedly hypermetabolic RIGHT upper lobe mass that extends to the visceral pleura and may extend into extrapleural fat of the RIGHT lung apex abutting the RIGHT first rib no mediastinal lymphadenopathy. 2. LEFT parotid lesion not well evaluated on the CT due to streak artifact but with focal area of increased metabolic activity in the LEFT parotid gland. This may represent primary parotid lesion but is incompletely characterized, suggest focused ultrasound or CT of the neck with contrast for further evaluation. 3. Signs of RIGHT hemithyroidectomy. 4. Mild uptake near the tip of the penis in an area that suggests the patient may be be uncircumcised. This may represent inflammation, correlate with any history of phimosis and with direct clinical inspection to exclude lesion in this location. 5. Nephrolithiasis and atherosclerosis as described. Electronically Signed   By: Zetta Bills M.D.   On: 02/06/2020 13:49   DG C-ARM BRONCHOSCOPY  Result Date: 01/30/2020 C-ARM BRONCHOSCOPY: Fluoroscopy was utilized by the requesting physician.  No radiographic interpretation.    ASSESSMENT: This is a very pleasant 74 years old African-American male recently diagnosed with stage IIIa (T4, N0, M0) non-small cell lung cancer, squamous cell carcinoma presented with large right upper lobe lung mass diagnosed in October 2021.   PLAN: I had a lengthy discussion with the patient and his friend today about his current disease stage, prognosis and treatment options. I explained to the patient that he has potentially incurable condition. I recommended for him a course of concurrent chemoradiation with weekly carboplatin for AUC of 2 and paclitaxel 45 mg/M2.  This will be followed by evaluation for surgical resection or consolidation treatment with immunotherapy. The patient is interested in proceeding with the treatment and he scheduled to start the 1st fraction of radiotherapy next  week. I discussed with him the adverse effect of this treatment including but not limited to alopecia, myelosuppression, nausea and vomiting, peripheral neuropathy, liver or renal dysfunction. He is expected to start the 1st dose of this treatment on 2020-02-27. I will arrange for the patient to have a chemotherapy education class before the 1st dose of treatment. I will call his pharmacy with prescription for Compazine 10 mg p.o. every 6 hours as needed for nausea. He will come back for follow-up visit 1 week after the start of his treatment for evaluation and management of any adverse effect of the treatment. The patient was advised to call immediately if he has any concerning symptoms in the interval. The patient voices understanding of current disease status and treatment options and is in agreement with the current care plan.  All questions were answered. The patient knows to call the clinic with any problems, questions or concerns. We can certainly see the patient much sooner if necessary.  Thank you so much for allowing me to participate in the care of Billy Hall. I will continue to follow up the patient with you and  assist in his care. The total time spent in the appointment was 90 minutes.  Disclaimer: This note was dictated with voice recognition software. Similar sounding words can inadvertently be transcribed and may not be corrected upon review.   Eilleen Kempf February 16, 2020, 3:29 PM

## 2020-02-16 NOTE — Progress Notes (Signed)
Oncology Nurse Navigator Documentation  Oncology Nurse Navigator Flowsheets 02/16/2020  Abnormal Finding Date 02/06/2020  Confirmed Diagnosis Date 01/30/2020  Diagnosis Status Confirmed Diagnosis Complete  Planned Course of Treatment Chemo/Radiation Concurrent  Phase of Treatment Radiation  Radiation Actual Start Date: 02/20/2020  Navigator Follow Up Date: 02/20/2020  Navigator Follow Up Reason: Appointment Review  Navigator Location [facility]  Navigator Encounter Type Clinic/MDC  Multidisiplinary Clinic Date 02/16/2020  Multidisiplinary Clinic Type Thoracic  Treatment Initiated Date 02/20/2020  Patient Visit Type MedOnc  Treatment Phase Pre-Tx/Tx Discussion  Barriers/Navigation Needs Coordination of Care;Education  Education Newly Diagnosed Cancer Education;Other  Interventions Coordination of Care;Education;Psycho-Social Support  Acuity Level 2-Minimal Needs (1-2 Barriers Identified)  Coordination of Care Other  Education Method Verbal;Written  Time Spent with Patient 30

## 2020-02-16 NOTE — Telephone Encounter (Signed)
Per Dr. Julien Nordmann, I notified path dept to send recent bx [id number] or [id number] for PDL 1 testing.

## 2020-02-17 ENCOUNTER — Ambulatory Visit (HOSPITAL_COMMUNITY)
Admission: RE | Admit: 2020-02-17 | Discharge: 2020-02-17 | Disposition: A | Discharge status: Home / Self Care | Payer: Medicare Other | Source: Ambulatory Visit | Attending: Thoracic Surgery (Cardiothoracic Vascular Surgery) | Admitting: Thoracic Surgery (Cardiothoracic Vascular Surgery)

## 2020-02-17 ENCOUNTER — Other Ambulatory Visit: Payer: Self-pay

## 2020-02-17 DIAGNOSIS — R590 Localized enlarged lymph nodes: Secondary | ICD-10-CM | POA: Diagnosis not present

## 2020-02-17 DIAGNOSIS — Z510 Encounter for antineoplastic radiation therapy: Secondary | ICD-10-CM | POA: Diagnosis not present

## 2020-02-17 DIAGNOSIS — C3411 Malignant neoplasm of upper lobe, right bronchus or lung: Secondary | ICD-10-CM | POA: Diagnosis not present

## 2020-02-17 DIAGNOSIS — Z87891 Personal history of nicotine dependence: Secondary | ICD-10-CM | POA: Diagnosis not present

## 2020-02-17 DIAGNOSIS — K118 Other diseases of salivary glands: Secondary | ICD-10-CM | POA: Diagnosis not present

## 2020-02-18 ENCOUNTER — Encounter: Payer: Self-pay | Admitting: Internal Medicine

## 2020-02-20 ENCOUNTER — Encounter: Payer: Self-pay | Admitting: *Deleted

## 2020-02-20 ENCOUNTER — Ambulatory Visit
Admission: RE | Admit: 2020-02-20 | Discharge: 2020-02-20 | Disposition: A | Discharge status: Home / Self Care | Payer: Medicare Other | Source: Ambulatory Visit | Attending: Radiation Oncology | Admitting: Radiation Oncology

## 2020-02-20 ENCOUNTER — Other Ambulatory Visit: Payer: Self-pay | Admitting: Thoracic Surgery (Cardiothoracic Vascular Surgery)

## 2020-02-20 ENCOUNTER — Other Ambulatory Visit: Payer: Self-pay

## 2020-02-20 ENCOUNTER — Other Ambulatory Visit: Payer: Self-pay | Admitting: Internal Medicine

## 2020-02-20 DIAGNOSIS — Z510 Encounter for antineoplastic radiation therapy: Secondary | ICD-10-CM | POA: Diagnosis not present

## 2020-02-20 DIAGNOSIS — Z87891 Personal history of nicotine dependence: Secondary | ICD-10-CM | POA: Diagnosis not present

## 2020-02-20 DIAGNOSIS — R590 Localized enlarged lymph nodes: Secondary | ICD-10-CM | POA: Diagnosis not present

## 2020-02-20 DIAGNOSIS — C3411 Malignant neoplasm of upper lobe, right bronchus or lung: Secondary | ICD-10-CM | POA: Diagnosis not present

## 2020-02-20 DIAGNOSIS — K118 Other diseases of salivary glands: Secondary | ICD-10-CM

## 2020-02-20 MED ORDER — PROCHLORPERAZINE MALEATE 10 MG PO TABS: 10.0000 mg | ORAL_TABLET | Freq: Four times a day (QID) | ORAL | 0 refills | Status: DC | PRN

## 2020-02-20 NOTE — Progress Notes (Signed)
I followed up on Billy Hall schedule. His treatment plan is concurrent chemo rad but chemo is not scheduled at this time. I followed up with scheduling to get him an appt.

## 2020-02-21 ENCOUNTER — Ambulatory Visit
Admission: RE | Admit: 2020-02-21 | Discharge: 2020-02-21 | Disposition: A | Discharge status: Home / Self Care | Payer: Medicare Other | Source: Ambulatory Visit | Attending: Radiation Oncology | Admitting: Radiation Oncology

## 2020-02-21 ENCOUNTER — Encounter: Payer: Self-pay | Admitting: *Deleted

## 2020-02-21 DIAGNOSIS — C3411 Malignant neoplasm of upper lobe, right bronchus or lung: Secondary | ICD-10-CM | POA: Diagnosis not present

## 2020-02-21 DIAGNOSIS — Z510 Encounter for antineoplastic radiation therapy: Secondary | ICD-10-CM | POA: Diagnosis not present

## 2020-02-21 DIAGNOSIS — R590 Localized enlarged lymph nodes: Secondary | ICD-10-CM | POA: Diagnosis not present

## 2020-02-21 DIAGNOSIS — Z87891 Personal history of nicotine dependence: Secondary | ICD-10-CM | POA: Diagnosis not present

## 2020-02-21 NOTE — Progress Notes (Signed)
I followed up on Billy Hall schedule. He is not set up for his infusion yet. I reached out to scheduling team to get scheduled.

## 2020-02-22 ENCOUNTER — Telehealth: Payer: Self-pay | Admitting: Internal Medicine

## 2020-02-22 ENCOUNTER — Other Ambulatory Visit: Payer: Self-pay

## 2020-02-22 ENCOUNTER — Ambulatory Visit
Admission: RE | Admit: 2020-02-22 | Discharge: 2020-02-22 | Disposition: A | Discharge status: Home / Self Care | Payer: Medicare Other | Source: Ambulatory Visit | Attending: Radiation Oncology | Admitting: Radiation Oncology

## 2020-02-22 ENCOUNTER — Encounter: Payer: Self-pay | Admitting: Internal Medicine

## 2020-02-22 ENCOUNTER — Inpatient Hospital Stay: Payer: Medicare Other

## 2020-02-22 DIAGNOSIS — Z87891 Personal history of nicotine dependence: Secondary | ICD-10-CM | POA: Diagnosis not present

## 2020-02-22 DIAGNOSIS — C3411 Malignant neoplasm of upper lobe, right bronchus or lung: Secondary | ICD-10-CM | POA: Diagnosis not present

## 2020-02-22 DIAGNOSIS — R590 Localized enlarged lymph nodes: Secondary | ICD-10-CM | POA: Diagnosis not present

## 2020-02-22 DIAGNOSIS — Z510 Encounter for antineoplastic radiation therapy: Secondary | ICD-10-CM | POA: Diagnosis not present

## 2020-02-22 NOTE — Telephone Encounter (Signed)
Scheduled remainder of appts. Called and not able to leave msg. Will give printout at chemo ed

## 2020-02-22 NOTE — Progress Notes (Signed)
There aren't any foundations offering copay assistance for pt's Dx and the type of ins he has.  I emailed Ailene Ravel and Vincente Liberty in the radiation dept requesting they reach out to pt regarding the [employer].

## 2020-02-23 ENCOUNTER — Ambulatory Visit
Admission: RE | Admit: 2020-02-23 | Discharge: 2020-02-23 | Disposition: A | Discharge status: Home / Self Care | Payer: Medicare Other | Source: Ambulatory Visit | Attending: Radiation Oncology | Admitting: Radiation Oncology

## 2020-02-23 DIAGNOSIS — Z510 Encounter for antineoplastic radiation therapy: Secondary | ICD-10-CM | POA: Diagnosis not present

## 2020-02-23 DIAGNOSIS — R590 Localized enlarged lymph nodes: Secondary | ICD-10-CM | POA: Diagnosis not present

## 2020-02-23 DIAGNOSIS — Z87891 Personal history of nicotine dependence: Secondary | ICD-10-CM | POA: Diagnosis not present

## 2020-02-23 DIAGNOSIS — C3411 Malignant neoplasm of upper lobe, right bronchus or lung: Secondary | ICD-10-CM | POA: Diagnosis not present

## 2020-02-23 NOTE — Progress Notes (Signed)

## 2020-02-24 ENCOUNTER — Ambulatory Visit
Admission: RE | Admit: 2020-02-24 | Discharge: 2020-02-24 | Disposition: A | Discharge status: Home / Self Care | Payer: Medicare Other | Source: Ambulatory Visit | Attending: Radiation Oncology | Admitting: Radiation Oncology

## 2020-02-24 ENCOUNTER — Inpatient Hospital Stay: Payer: Medicare Other

## 2020-02-24 ENCOUNTER — Other Ambulatory Visit: Payer: Self-pay

## 2020-02-24 DIAGNOSIS — E119 Type 2 diabetes mellitus without complications: Secondary | ICD-10-CM | POA: Diagnosis not present

## 2020-02-24 DIAGNOSIS — C3411 Malignant neoplasm of upper lobe, right bronchus or lung: Secondary | ICD-10-CM

## 2020-02-24 DIAGNOSIS — I6782 Cerebral ischemia: Secondary | ICD-10-CM | POA: Diagnosis not present

## 2020-02-24 DIAGNOSIS — M1990 Unspecified osteoarthritis, unspecified site: Secondary | ICD-10-CM | POA: Diagnosis not present

## 2020-02-24 DIAGNOSIS — Z510 Encounter for antineoplastic radiation therapy: Secondary | ICD-10-CM | POA: Diagnosis not present

## 2020-02-24 DIAGNOSIS — R042 Hemoptysis: Secondary | ICD-10-CM | POA: Diagnosis not present

## 2020-02-24 DIAGNOSIS — R5383 Other fatigue: Secondary | ICD-10-CM | POA: Diagnosis not present

## 2020-02-24 DIAGNOSIS — Z87891 Personal history of nicotine dependence: Secondary | ICD-10-CM | POA: Diagnosis not present

## 2020-02-24 DIAGNOSIS — K118 Other diseases of salivary glands: Secondary | ICD-10-CM | POA: Diagnosis not present

## 2020-02-24 DIAGNOSIS — K4020 Bilateral inguinal hernia, without obstruction or gangrene, not specified as recurrent: Secondary | ICD-10-CM | POA: Diagnosis not present

## 2020-02-24 DIAGNOSIS — R059 Cough, unspecified: Secondary | ICD-10-CM | POA: Diagnosis not present

## 2020-02-24 DIAGNOSIS — Z79899 Other long term (current) drug therapy: Secondary | ICD-10-CM | POA: Diagnosis not present

## 2020-02-24 DIAGNOSIS — E785 Hyperlipidemia, unspecified: Secondary | ICD-10-CM | POA: Diagnosis not present

## 2020-02-24 DIAGNOSIS — N200 Calculus of kidney: Secondary | ICD-10-CM | POA: Diagnosis not present

## 2020-02-24 DIAGNOSIS — I10 Essential (primary) hypertension: Secondary | ICD-10-CM | POA: Diagnosis not present

## 2020-02-24 DIAGNOSIS — R590 Localized enlarged lymph nodes: Secondary | ICD-10-CM | POA: Diagnosis not present

## 2020-02-24 DIAGNOSIS — E039 Hypothyroidism, unspecified: Secondary | ICD-10-CM | POA: Diagnosis not present

## 2020-02-24 LAB — CBC WITH DIFFERENTIAL (CANCER CENTER ONLY)
Abs Immature Granulocytes: 0.03 10*3/uL (ref 0.00–0.07)
Basophils Absolute: 0 10*3/uL (ref 0.0–0.1)
Basophils Relative: 1 %
Eosinophils Absolute: 0.2 10*3/uL (ref 0.0–0.5)
Eosinophils Relative: 2 %
HCT: 39.8 % (ref 39.0–52.0)
Hemoglobin: 12.7 g/dL — ABNORMAL LOW (ref 13.0–17.0)
Immature Granulocytes: 0 %
Lymphocytes Relative: 10 %
Lymphs Abs: 0.9 10*3/uL (ref 0.7–4.0)
MCH: 25.5 pg — ABNORMAL LOW (ref 26.0–34.0)
MCHC: 31.9 g/dL (ref 30.0–36.0)
MCV: 79.8 fL — ABNORMAL LOW (ref 80.0–100.0)
Monocytes Absolute: 0.7 10*3/uL (ref 0.1–1.0)
Monocytes Relative: 8 %
Neutro Abs: 7 10*3/uL (ref 1.7–7.7)
Neutrophils Relative %: 79 %
Platelet Count: 263 10*3/uL (ref 150–400)
RBC: 4.99 MIL/uL (ref 4.22–5.81)
RDW: 15.5 % (ref 11.5–15.5)
WBC Count: 8.8 10*3/uL (ref 4.0–10.5)
nRBC: 0 % (ref 0.0–0.2)

## 2020-02-24 LAB — CMP (CANCER CENTER ONLY)
ALT: 14 U/L (ref 0–44)
AST: 15 U/L (ref 15–41)
Albumin: 3 g/dL — ABNORMAL LOW (ref 3.5–5.0)
Alkaline Phosphatase: 67 U/L (ref 38–126)
Anion gap: 8 (ref 5–15)
BUN: 14 mg/dL (ref 8–23)
CO2: 27 mmol/L (ref 22–32)
Calcium: 9.2 mg/dL (ref 8.9–10.3)
Chloride: 104 mmol/L (ref 98–111)
Creatinine: 0.82 mg/dL (ref 0.61–1.24)
GFR, Estimated: >60 mL/min (ref >60)
Glucose, Bld: 103 mg/dL — ABNORMAL HIGH (ref 70–99)
Potassium: 4.1 mmol/L (ref 3.5–5.1)
Sodium: 139 mmol/L (ref 135–145)
Total Bilirubin: 0.6 mg/dL (ref 0.3–1.2)
Total Protein: 7.1 g/dL (ref 6.5–8.1)

## 2020-02-24 MED ORDER — SONAFINE EX EMUL
1.0000 "application " | Freq: Two times a day (BID) | CUTANEOUS | Status: DC
  Administered 2020-02-24: 1 via TOPICAL

## 2020-02-25 DIAGNOSIS — E1165 Type 2 diabetes mellitus with hyperglycemia: Secondary | ICD-10-CM | POA: Diagnosis not present

## 2020-02-25 DIAGNOSIS — I10 Essential (primary) hypertension: Secondary | ICD-10-CM | POA: Diagnosis not present

## 2020-02-25 DIAGNOSIS — Z7984 Long term (current) use of oral hypoglycemic drugs: Secondary | ICD-10-CM | POA: Diagnosis not present

## 2020-02-27 ENCOUNTER — Ambulatory Visit
Admission: RE | Admit: 2020-02-27 | Discharge: 2020-02-27 | Disposition: A | Discharge status: Home / Self Care | Payer: Medicare Other | Source: Ambulatory Visit | Attending: Radiation Oncology | Admitting: Radiation Oncology

## 2020-02-27 ENCOUNTER — Inpatient Hospital Stay: Payer: Medicare Other | Attending: Internal Medicine

## 2020-02-27 ENCOUNTER — Other Ambulatory Visit: Payer: Self-pay

## 2020-02-27 VITALS — BP 126/75 | HR 55 | Temp 98.1°F | Resp 18

## 2020-02-27 DIAGNOSIS — E119 Type 2 diabetes mellitus without complications: Secondary | ICD-10-CM | POA: Insufficient documentation

## 2020-02-27 DIAGNOSIS — Z5111 Encounter for antineoplastic chemotherapy: Secondary | ICD-10-CM | POA: Insufficient documentation

## 2020-02-27 DIAGNOSIS — R590 Localized enlarged lymph nodes: Secondary | ICD-10-CM | POA: Diagnosis not present

## 2020-02-27 DIAGNOSIS — Z510 Encounter for antineoplastic radiation therapy: Secondary | ICD-10-CM | POA: Diagnosis not present

## 2020-02-27 DIAGNOSIS — R0609 Other forms of dyspnea: Secondary | ICD-10-CM | POA: Diagnosis not present

## 2020-02-27 DIAGNOSIS — Z87891 Personal history of nicotine dependence: Secondary | ICD-10-CM | POA: Diagnosis not present

## 2020-02-27 DIAGNOSIS — Z79899 Other long term (current) drug therapy: Secondary | ICD-10-CM | POA: Diagnosis not present

## 2020-02-27 DIAGNOSIS — C3411 Malignant neoplasm of upper lobe, right bronchus or lung: Secondary | ICD-10-CM | POA: Insufficient documentation

## 2020-02-27 DIAGNOSIS — I700 Atherosclerosis of aorta: Secondary | ICD-10-CM | POA: Insufficient documentation

## 2020-02-27 DIAGNOSIS — K30 Functional dyspepsia: Secondary | ICD-10-CM | POA: Diagnosis not present

## 2020-02-27 DIAGNOSIS — N200 Calculus of kidney: Secondary | ICD-10-CM | POA: Diagnosis not present

## 2020-02-27 MED ORDER — PALONOSETRON HCL INJECTION 0.25 MG/5ML
0.2500 mg | Freq: Once | INTRAVENOUS | Status: AC
  Administered 2020-02-27: 0.25 mg via INTRAVENOUS

## 2020-02-27 MED ORDER — SODIUM CHLORIDE 0.9 % IV SOLN
Freq: Once | INTRAVENOUS | Status: AC
  Filled 2020-02-27: qty 250

## 2020-02-27 MED ORDER — SODIUM CHLORIDE 0.9 % IV SOLN
263.6000 mg | Freq: Once | INTRAVENOUS | Status: AC
  Administered 2020-02-27: 260 mg via INTRAVENOUS
  Filled 2020-02-27: qty 26

## 2020-02-27 MED ORDER — PALONOSETRON HCL INJECTION 0.25 MG/5ML
INTRAVENOUS | Status: AC
  Filled 2020-02-27: qty 5

## 2020-02-27 MED ORDER — FAMOTIDINE IN NACL 20-0.9 MG/50ML-% IV SOLN
INTRAVENOUS | Status: AC
  Filled 2020-02-27: qty 50

## 2020-02-27 MED ORDER — DIPHENHYDRAMINE HCL 50 MG/ML IJ SOLN
INTRAMUSCULAR | Status: AC
  Filled 2020-02-27: qty 1

## 2020-02-27 MED ORDER — SODIUM CHLORIDE 0.9 % IV SOLN
45.0000 mg/m2 | Freq: Once | INTRAVENOUS | Status: AC
  Administered 2020-02-27: 108 mg via INTRAVENOUS
  Filled 2020-02-27: qty 18

## 2020-02-27 MED ORDER — FAMOTIDINE IN NACL 20-0.9 MG/50ML-% IV SOLN
20.0000 mg | Freq: Once | INTRAVENOUS | Status: AC
  Administered 2020-02-27: 20 mg via INTRAVENOUS

## 2020-02-27 MED ORDER — DIPHENHYDRAMINE HCL 50 MG/ML IJ SOLN
50.0000 mg | Freq: Once | INTRAMUSCULAR | Status: AC
  Administered 2020-02-27: 50 mg via INTRAVENOUS

## 2020-02-27 MED ORDER — SODIUM CHLORIDE 0.9 % IV SOLN
20.0000 mg | Freq: Once | INTRAVENOUS | Status: AC
  Administered 2020-02-27: 20 mg via INTRAVENOUS
  Filled 2020-02-27: qty 20

## 2020-02-27 NOTE — Patient Instructions (Signed)
[facility] Discharge Instructions for Patients Receiving Chemotherapy  Today you received the following chemotherapy agents paclitaxel, carboplatin.  To help prevent nausea and vomiting after your treatment, we encourage you to take your nausea medication as directed.    If you develop nausea and vomiting that is not controlled by your nausea medication, call the clinic.   BELOW ARE SYMPTOMS THAT SHOULD BE REPORTED IMMEDIATELY:  *FEVER GREATER THAN 100.5 F  *CHILLS WITH OR WITHOUT FEVER  NAUSEA AND VOMITING THAT IS NOT CONTROLLED WITH YOUR NAUSEA MEDICATION  *UNUSUAL SHORTNESS OF BREATH  *UNUSUAL BRUISING OR BLEEDING  TENDERNESS IN MOUTH AND THROAT WITH OR WITHOUT PRESENCE OF ULCERS  *URINARY PROBLEMS  *BOWEL PROBLEMS  UNUSUAL RASH Items with * indicate a potential emergency and should be followed up as soon as possible.  Feel free to call the clinic should you have any questions or concerns. The clinic phone number is [phone].  Please show the [facility] at check-in to the Emergency Department and triage nurse.  Paclitaxel injection What is this medicine? PACLITAXEL (PAK li TAX el) is a chemotherapy drug. It targets fast dividing cells, like cancer cells, and causes these cells to die. This medicine is used to treat ovarian cancer, breast cancer, lung cancer, Kaposi's sarcoma, and other cancers. This medicine may be used for other purposes; ask your health care provider or pharmacist if you have questions. COMMON BRAND NAME(S): Onxol, Taxol What should I tell my health care provider before I take this medicine? They need to know if you have any of these conditions:  history of irregular heartbeat  liver disease  low blood counts, like low white cell, platelet, or red cell counts  lung or breathing disease, like asthma  tingling of the fingers or toes, or other nerve disorder  an unusual or allergic reaction to paclitaxel, alcohol,  polyoxyethylated castor oil, other chemotherapy, other medicines, foods, dyes, or preservatives  pregnant or trying to get pregnant  breast-feeding How should I use this medicine? This drug is given as an infusion into a vein. It is administered in a hospital or clinic by a specially trained health care professional. Talk to your pediatrician regarding the use of this medicine in children. Special care may be needed. Overdosage: If you think you have taken too much of this medicine contact a poison control center or emergency room at once. NOTE: This medicine is only for you. Do not share this medicine with others. What if I miss a dose? It is important not to miss your dose. Call your doctor or health care professional if you are unable to keep an appointment. What may interact with this medicine? Do not take this medicine with any of the following medications:  disulfiram  metronidazole This medicine may also interact with the following medications:  antiviral medicines for hepatitis, HIV or AIDS  certain antibiotics like erythromycin and clarithromycin  certain medicines for fungal infections like ketoconazole and itraconazole  certain medicines for seizures like carbamazepine, phenobarbital, phenytoin  gemfibrozil  nefazodone  rifampin  St. John's wort This list may not describe all possible interactions. Give your health care provider a list of all the medicines, herbs, non-prescription drugs, or dietary supplements you use. Also tell them if you smoke, drink alcohol, or use illegal drugs. Some items may interact with your medicine. What should I watch for while using this medicine? Your condition will be monitored carefully while you are receiving this medicine. You will need important  blood work done while you are taking this medicine. This medicine can cause serious allergic reactions. To reduce your risk you will need to take other medicine(s) before treatment with this  medicine. If you experience allergic reactions like skin rash, itching or hives, swelling of the face, lips, or tongue, tell your doctor or health care professional right away. In some cases, you may be given additional medicines to help with side effects. Follow all directions for their use. This drug may make you feel generally unwell. This is not uncommon, as chemotherapy can affect healthy cells as well as cancer cells. Report any side effects. Continue your course of treatment even though you feel ill unless your doctor tells you to stop. Call your doctor or health care professional for advice if you get a fever, chills or sore throat, or other symptoms of a cold or flu. Do not treat yourself. This drug decreases your body's ability to fight infections. Try to avoid being around people who are sick. This medicine may increase your risk to bruise or bleed. Call your doctor or health care professional if you notice any unusual bleeding. Be careful brushing and flossing your teeth or using a toothpick because you may get an infection or bleed more easily. If you have any dental work done, tell your dentist you are receiving this medicine. Avoid taking products that contain aspirin, acetaminophen, ibuprofen, naproxen, or ketoprofen unless instructed by your doctor. These medicines may hide a fever. Do not become pregnant while taking this medicine. Women should inform their doctor if they wish to become pregnant or think they might be pregnant. There is a potential for serious side effects to an unborn child. Talk to your health care professional or pharmacist for more information. Do not breast-feed an infant while taking this medicine. Men are advised not to father a child while receiving this medicine. This product may contain alcohol. Ask your pharmacist or healthcare provider if this medicine contains alcohol. Be sure to tell all healthcare providers you are taking this medicine. Certain medicines,  like metronidazole and disulfiram, can cause an unpleasant reaction when taken with alcohol. The reaction includes flushing, headache, nausea, vomiting, sweating, and increased thirst. The reaction can last from 30 minutes to several hours. What side effects may I notice from receiving this medicine? Side effects that you should report to your doctor or health care professional as soon as possible:  allergic reactions like skin rash, itching or hives, swelling of the face, lips, or tongue  breathing problems  changes in vision  fast, irregular heartbeat  high or low blood pressure  mouth sores  pain, tingling, numbness in the hands or feet  signs of decreased platelets or bleeding - bruising, pinpoint red spots on the skin, black, tarry stools, blood in the urine  signs of decreased red blood cells - unusually weak or tired, feeling faint or lightheaded, falls  signs of infection - fever or chills, cough, sore throat, pain or difficulty passing urine  signs and symptoms of liver injury like dark yellow or brown urine; general ill feeling or flu-like symptoms; light-colored stools; loss of appetite; nausea; right upper belly pain; unusually weak or tired; yellowing of the eyes or skin  swelling of the ankles, feet, hands  unusually slow heartbeat Side effects that usually do not require medical attention (report to your doctor or health care professional if they continue or are bothersome):  diarrhea  hair loss  loss of appetite  muscle or joint  pain  nausea, vomiting  pain, redness, or irritation at site where injected  tiredness This list may not describe all possible side effects. Call your doctor for medical advice about side effects. You may report side effects to FDA at 1-800-FDA-1088. Where should I keep my medicine? This drug is given in a hospital or clinic and will not be stored at home. NOTE: This sheet is a summary. It may not cover all possible information.  If you have questions about this medicine, talk to your doctor, pharmacist, or health care provider.  2020 [employer]/Gold Standard (2016-12-16 13:14:55)    Carboplatin injection What is this medicine? CARBOPLATIN (KAR boe pla tin) is a chemotherapy drug. It targets fast dividing cells, like cancer cells, and causes these cells to die. This medicine is used to treat ovarian cancer and many other cancers. This medicine may be used for other purposes; ask your health care provider or pharmacist if you have questions. COMMON BRAND NAME(S): Paraplatin What should I tell my health care provider before I take this medicine? They need to know if you have any of these conditions:  blood disorders  hearing problems  kidney disease  recent or ongoing radiation therapy  an unusual or allergic reaction to carboplatin, cisplatin, other chemotherapy, other medicines, foods, dyes, or preservatives  pregnant or trying to get pregnant  breast-feeding How should I use this medicine? This drug is usually given as an infusion into a vein. It is administered in a hospital or clinic by a specially trained health care professional. Talk to your pediatrician regarding the use of this medicine in children. Special care may be needed. Overdosage: If you think you have taken too much of this medicine contact a poison control center or emergency room at once. NOTE: This medicine is only for you. Do not share this medicine with others. What if I miss a dose? It is important not to miss a dose. Call your doctor or health care professional if you are unable to keep an appointment. What may interact with this medicine?  medicines for seizures  medicines to increase blood counts like filgrastim, pegfilgrastim, sargramostim  some antibiotics like amikacin, gentamicin, neomycin, streptomycin, tobramycin  vaccines Talk to your doctor or health care professional before taking any of these  medicines:  acetaminophen  aspirin  ibuprofen  ketoprofen  naproxen This list may not describe all possible interactions. Give your health care provider a list of all the medicines, herbs, non-prescription drugs, or dietary supplements you use. Also tell them if you smoke, drink alcohol, or use illegal drugs. Some items may interact with your medicine. What should I watch for while using this medicine? Your condition will be monitored carefully while you are receiving this medicine. You will need important blood work done while you are taking this medicine. This drug may make you feel generally unwell. This is not uncommon, as chemotherapy can affect healthy cells as well as cancer cells. Report any side effects. Continue your course of treatment even though you feel ill unless your doctor tells you to stop. In some cases, you may be given additional medicines to help with side effects. Follow all directions for their use. Call your doctor or health care professional for advice if you get a fever, chills or sore throat, or other symptoms of a cold or flu. Do not treat yourself. This drug decreases your body's ability to fight infections. Try to avoid being around people who are sick. This medicine may increase your risk  to bruise or bleed. Call your doctor or health care professional if you notice any unusual bleeding. Be careful brushing and flossing your teeth or using a toothpick because you may get an infection or bleed more easily. If you have any dental work done, tell your dentist you are receiving this medicine. Avoid taking products that contain aspirin, acetaminophen, ibuprofen, naproxen, or ketoprofen unless instructed by your doctor. These medicines may hide a fever. Do not become pregnant while taking this medicine. Women should inform their doctor if they wish to become pregnant or think they might be pregnant. There is a potential for serious side effects to an unborn child. Talk  to your health care professional or pharmacist for more information. Do not breast-feed an infant while taking this medicine. What side effects may I notice from receiving this medicine? Side effects that you should report to your doctor or health care professional as soon as possible:  allergic reactions like skin rash, itching or hives, swelling of the face, lips, or tongue  signs of infection - fever or chills, cough, sore throat, pain or difficulty passing urine  signs of decreased platelets or bleeding - bruising, pinpoint red spots on the skin, black, tarry stools, nosebleeds  signs of decreased red blood cells - unusually weak or tired, fainting spells, lightheadedness  breathing problems  changes in hearing  changes in vision  chest pain  high blood pressure  low blood counts - This drug may decrease the number of white blood cells, red blood cells and platelets. You may be at increased risk for infections and bleeding.  nausea and vomiting  pain, swelling, redness or irritation at the injection site  pain, tingling, numbness in the hands or feet  problems with balance, talking, walking  trouble passing urine or change in the amount of urine Side effects that usually do not require medical attention (report to your doctor or health care professional if they continue or are bothersome):  hair loss  loss of appetite  metallic taste in the mouth or changes in taste This list may not describe all possible side effects. Call your doctor for medical advice about side effects. You may report side effects to FDA at 1-800-FDA-1088. Where should I keep my medicine? This drug is given in a hospital or clinic and will not be stored at home. NOTE: This sheet is a summary. It may not cover all possible information. If you have questions about this medicine, talk to your doctor, pharmacist, or health care provider.  2020 [employer]/Gold Standard (2007-07-20 14:38:05)

## 2020-02-27 NOTE — Progress Notes (Signed)
[facility] OFFICE PROGRESS NOTE  [name], MD [place] [address]  DIAGNOSIS: Stage IIIa (T4, N0, M0) non-small cell lung cancer, squamous cell carcinoma presented with large right upper lobe lung mass diagnosed in October 2021.  PRIOR THERAPY: None  CURRENT THERAPY: Concurrent chemoradiation with weekly carboplatin for AUC of 2 and paclitaxel 45 mg/M2. Status post 1 cycle. First dose on 02/27/20  INTERVAL HISTORY: Billy Hall 74 y.o. male returns to the clinic for a follow up visit. The patient is feeling well today without any concerning complaints except his is concerned about his diabetes. He notes that his blood sugar was 200 this AM prior to coming to his appointment. His diabetes is managed by his PCP. He takes metformin. The patient received his first dose of chemotherapy last week and tolerated it well without any adverse side effects. His last day of radiation is scheduled for 04/06/20. Denies any fever, chills, night sweats, or weight loss. Denies any chest pain. He reports his baseline dyspnea on exertion. He denies cough but reports an indigestion feeling. He does not take any medications for reflux. Denies any nausea, vomiting, diarrhea, or constipation. Denies any headache or visual changes. The patient is here today for evaluation prior to starting cycle # 2   MEDICAL HISTORY: Past Medical History:  Diagnosis Date  . Arthritis   . Depressive disorder   . Diabetes mellitus without complication ([place])   . Fatigue   . Headache(784.0)   . Hyperlipidemia   . Hypertension   . Hypothyroidism   . Snoring     ALLERGIES:  is allergic to ace inhibitors.  MEDICATIONS:  Current Outpatient Medications  Medication Sig Dispense Refill  . acetaminophen (TYLENOL 8 HOUR) 650 MG CR tablet Take 650 mg by mouth every 8 (eight) hours as needed for pain.    Marland Kitchen aspirin EC 81 MG tablet Take 81 mg by mouth daily.    . cholecalciferol (VITAMIN  D3) 25 MCG (1000 UNIT) tablet Take 1,000 Units by mouth daily.    Marland Kitchen levothyroxine (SYNTHROID) 88 MCG tablet Take 88 mcg by mouth daily before breakfast.     . losartan (COZAAR) 50 MG tablet Take 50 mg by mouth daily.     . metFORMIN (GLUCOPHAGE) 500 MG tablet Take 500 mg by mouth 2 (two) times daily with a meal.     . prochlorperazine (COMPAZINE) 10 MG tablet Take 1 tablet (10 mg total) by mouth every 6 (six) hours as needed for nausea or vomiting. 30 tablet 0  . sildenafil (REVATIO) 20 MG tablet Take 20 mg by mouth daily as needed (ED).     . simvastatin (ZOCOR) 10 MG tablet Take 10 mg by mouth every evening.     . tamsulosin (FLOMAX) 0.4 MG CAPS capsule Take 0.4 mg by mouth daily.     No current facility-administered medications for this visit.    SURGICAL HISTORY:  Past Surgical History:  Procedure Laterality Date  . COLONOSCOPY N/A 03/08/2013   Procedure: COLONOSCOPY;  Surgeon: Jamesetta So, MD;  Location: [facility];  Service: Gastroenterology;  Laterality: N/A;  . KNEE ARTHROSCOPY Right    2006 By Dr. Luna Glasgow at [facility].   . THYROID SURGERY    . VIDEO BRONCHOSCOPY WITH ENDOBRONCHIAL ULTRASOUND N/A 01/30/2020   Procedure: VIDEO BRONCHOSCOPY WITH ENDOBRONCHIAL ULTRASOUND;  Surgeon: Melrose Nakayama, MD;  Location: [facility];  Service: Thoracic;  Laterality: N/A;    REVIEW OF SYSTEMS:   Review  of Systems  Constitutional: Negative for appetite change, chills, fatigue, fever and unexpected weight change.  HENT: Negative for mouth sores, nosebleeds, sore throat and trouble swallowing.   Eyes: Negative for eye problems and icterus.  Respiratory: Positive for baseline dyspnea on exertion. Negative for cough, hemoptysis, and wheezing.   Cardiovascular: Negative for chest pain and leg swelling.  Gastrointestinal: Positive for indigestion. Negative for abdominal pain, constipation, diarrhea, nausea and vomiting.  Genitourinary: Negative for bladder incontinence, difficulty urinating,  dysuria, frequency and hematuria.   Musculoskeletal: Negative for back pain, gait problem, neck pain and neck stiffness.  Skin: Negative for itching and rash.  Neurological: Negative for dizziness, extremity weakness, gait problem, headaches, light-headedness and seizures.  Hematological: Negative for adenopathy. Does not bruise/bleed easily.  Psychiatric/Behavioral: Negative for confusion, depression and sleep disturbance. The patient is not nervous/anxious.     PHYSICAL EXAMINATION:  Blood pressure 131/70, pulse 75, temperature (!) 97.5 F (36.4 C), temperature source Tympanic, resp. rate 17, height 5' 9.5" (1.765 m), weight 256 lb 9.6 oz (116.4 kg), SpO2 97 %.  ECOG PERFORMANCE STATUS: 1 - Symptomatic but completely ambulatory  Physical Exam  Constitutional: Oriented to person, place, and time and well-developed, well-nourished, and in no distress.  HENT:  Head: Normocephalic and atraumatic.  Mouth/Throat: Oropharynx is clear and moist. No oropharyngeal exudate.  Eyes: Conjunctivae are normal. Right eye exhibits no discharge. Left eye exhibits no discharge. No scleral icterus.  Neck: Normal range of motion. Neck supple.  Cardiovascular: Normal rate, regular rhythm, normal heart sounds and intact distal pulses.   Pulmonary/Chest: Effort normal and breath sounds normal. No respiratory distress. No wheezes. No rales.  Abdominal: Soft. Bowel sounds are normal. Exhibits no distension and no mass. There is no tenderness.  Musculoskeletal: Normal range of motion. Exhibits no edema.  Lymphadenopathy:    No cervical adenopathy.  Neurological: Alert and oriented to person, place, and time. Exhibits normal muscle tone. Gait normal. Coordination normal.  Skin: Skin is warm and dry. No rash noted. Not diaphoretic. No erythema. No pallor.  Psychiatric: Mood, memory and judgment normal.  Vitals reviewed.  LABORATORY DATA: Lab Results  Component Value Date   WBC 6.7 03/06/2020   HGB 12.2 (L)  03/06/2020   HCT 36.8 (L) 03/06/2020   MCV 78.6 (L) 03/06/2020   PLT 262 03/06/2020      Chemistry      Component Value Date/Time   NA 138 03/06/2020 0737   K 4.0 03/06/2020 0737   CL 105 03/06/2020 0737   CO2 25 03/06/2020 0737   BUN 15 03/06/2020 0737   CREATININE 0.77 03/06/2020 0737      Component Value Date/Time   CALCIUM 9.0 03/06/2020 0737   ALKPHOS 74 03/06/2020 0737   AST 25 03/06/2020 0737   ALT 42 03/06/2020 0737   BILITOT 0.6 03/06/2020 0737       RADIOGRAPHIC STUDIES:  US SOFT TISSUE HEAD & NECK (NON-THYROID)  Result Date: 02/17/2020 CLINICAL DATA:  Initial evaluation for asymmetric left parotid gland fullness on recent PET-CT. Evaluate for possible mass. EXAM: ULTRASOUND OF HEAD/NECK SOFT TISSUES TECHNIQUE: Ultrasound examination of the head and neck soft tissues was performed in the area of clinical concern. COMPARISON:  Prior PET-CT from 02/06/2020. FINDINGS: Targeted ultrasound of both parotid glands was performed. No discrete mass lesion identified within either parotid gland. Specifically, no appreciable discrete lesion is seen within the left parotid gland to correspond with area of focal increased FDG uptake seen on prior PET-CT. No visible  adenopathy. The glandular tissue itself is normal in appearance without evidence for acute parotitis. No abnormal ductal dilatation or evidence for sialolithiasis. IMPRESSION: Negative ultrasound of the parotid glands. No discrete mass or other abnormality identified. Electronically Signed   By: Jeannine Boga M.D.   On: 02/17/2020 15:12   NM PET Image Initial (PI) Skull Base To Thigh  Result Date: 02/06/2020 CLINICAL DATA:  Initial treatment strategy for RIGHT lung mass. EXAM: NUCLEAR MEDICINE PET SKULL BASE TO THIGH TECHNIQUE: 12.8 mCi F-18 FDG was injected intravenously. Full-ring PET imaging was performed from the skull base to thigh after the radiotracer. CT data was obtained and used for attenuation correction  and anatomic localization. Fasting blood glucose: 112 mg/dl COMPARISON:  CT chest of 01/09/2020 FINDINGS: Mediastinal blood pool activity: SUV max 2.02 Liver activity: SUV max NA NECK: Area of increased FDG uptake LEFT parotid measuring approximately 1.3 cm but without definite CT correlate no adenopathy in the neck Incidental CT findings: none CHEST: Large RIGHT upper lobe mass extends to the pleural surface in the peripheral RIGHT chest measuring 9.8 x 7.0 cm, associated with more volume loss in the RIGHT upper lobe when compared to the prior study. (SUVmax = twenty-seven) No hypermetabolic lymph nodes in the chest. Scattered small lymph nodes throughout the chest without pathologic enlargement or increased metabolism. Mass margins with extrapleural fat are indistinct soft tissue may abut the RIGHT first rib. Incidental CT findings: none ABDOMEN/PELVIS: No abnormal hypermetabolic activity within the liver, pancreas, adrenal glands, or spleen. No hypermetabolic lymph nodes in the abdomen or pelvis. Mildly increased metabolic activity associated with the tip of the penis with some gas surrounding the tip of the penis and soft tissue that suggests this patient may be uncircumcised. Findings are nonspecific and not well evaluated the current exam Incidental CT findings: Nephrolithiasis in the lower pole the RIGHT kidney. Low-density RIGHT renal lesion arises from the upper pole smoothly marginated in likely a cyst, without increased metabolic activity. Nephrolithiasis in the lower pole the LEFT kidney, renal calculi approximately 3 mm bilaterally. No acute gastrointestinal process colonic diverticulosis. Normal appendix. Calcified atheromatous plaque of the abdominal aorta without aneurysmal dilation. Heterogeneous prostate, nonspecific. Small fat containing bilateral inguinal hernias. SKELETON: No focal hypermetabolic activity to suggest skeletal metastasis. Incidental CT findings: Spinal degenerative changes. No  acute or destructive bone findings. IMPRESSION: 1. Markedly hypermetabolic RIGHT upper lobe mass that extends to the visceral pleura and may extend into extrapleural fat of the RIGHT lung apex abutting the RIGHT first rib no mediastinal lymphadenopathy. 2. LEFT parotid lesion not well evaluated on the CT due to streak artifact but with focal area of increased metabolic activity in the LEFT parotid gland. This may represent primary parotid lesion but is incompletely characterized, suggest focused ultrasound or CT of the neck with contrast for further evaluation. 3. Signs of RIGHT hemithyroidectomy. 4. Mild uptake near the tip of the penis in an area that suggests the patient may be be uncircumcised. This may represent inflammation, correlate with any history of phimosis and with direct clinical inspection to exclude lesion in this location. 5. Nephrolithiasis and atherosclerosis as described. Electronically Signed   By: Zetta Bills M.D.   On: 02/06/2020 13:49     ASSESSMENT/PLAN:  This is a very pleasant 74 year old African-American male recently diagnosed with stage IIIa (T4, N0, M0) non-small cell lung cancer, squamous cell carcinoma presented with large right upper lobe lung mass diagnosed in October 2021.  The patient is currently undergoing  weekly concurrent chemoradiation with carboplatin for an AUC of 2 and paclitaxel 45 mg per metered squared.  The patient is status post one cycle and tolerated it well.  His last day of radiation is expected for 04/06/2020.  After the patient completes concurrent chemoradiation, this will be followed by evaluation for surgical resection or consolidation treatment with immunotherapy.   Labs were reviewed.  Recommend that he proceed with cycle #2 today scheduled.  We will see him back for follow-up visit in 2 weeks for evaluation before starting cycle #4.  Encouraged the patient to keep a log of his blood sugar readings and share these results with his PCP for  further management if they continue to be high.   Encouraged the patient to try taking a PPI for his indigestion.   The patient was advised to call immediately if he has any concerning symptoms in the interval. The patient voices understanding of current disease status and treatment options and is in agreement with the current care plan. All questions were answered. The patient knows to call the clinic with any problems, questions or concerns. We can certainly see the patient much sooner if necessary     No orders of the defined types were placed in this encounter.    Billy Demonte L Maddison Kilner, PA-C 03/06/20

## 2020-02-28 ENCOUNTER — Ambulatory Visit
Admission: RE | Admit: 2020-02-28 | Discharge: 2020-02-28 | Disposition: A | Discharge status: Home / Self Care | Payer: Medicare Other | Source: Ambulatory Visit | Attending: Radiation Oncology | Admitting: Radiation Oncology

## 2020-02-28 ENCOUNTER — Telehealth: Payer: Self-pay | Admitting: *Deleted

## 2020-02-28 ENCOUNTER — Other Ambulatory Visit: Payer: Self-pay

## 2020-02-28 DIAGNOSIS — C3411 Malignant neoplasm of upper lobe, right bronchus or lung: Secondary | ICD-10-CM | POA: Diagnosis not present

## 2020-02-28 DIAGNOSIS — Z87891 Personal history of nicotine dependence: Secondary | ICD-10-CM | POA: Diagnosis not present

## 2020-02-28 DIAGNOSIS — Z510 Encounter for antineoplastic radiation therapy: Secondary | ICD-10-CM | POA: Diagnosis not present

## 2020-02-28 DIAGNOSIS — R590 Localized enlarged lymph nodes: Secondary | ICD-10-CM | POA: Diagnosis not present

## 2020-02-29 ENCOUNTER — Ambulatory Visit
Admission: RE | Admit: 2020-02-29 | Discharge: 2020-02-29 | Disposition: A | Discharge status: Home / Self Care | Payer: Medicare Other | Source: Ambulatory Visit | Attending: Radiation Oncology | Admitting: Radiation Oncology

## 2020-02-29 DIAGNOSIS — Z510 Encounter for antineoplastic radiation therapy: Secondary | ICD-10-CM | POA: Diagnosis not present

## 2020-02-29 DIAGNOSIS — Z87891 Personal history of nicotine dependence: Secondary | ICD-10-CM | POA: Diagnosis not present

## 2020-02-29 DIAGNOSIS — R590 Localized enlarged lymph nodes: Secondary | ICD-10-CM | POA: Diagnosis not present

## 2020-02-29 DIAGNOSIS — C3411 Malignant neoplasm of upper lobe, right bronchus or lung: Secondary | ICD-10-CM | POA: Diagnosis not present

## 2020-03-01 ENCOUNTER — Ambulatory Visit
Admission: RE | Admit: 2020-03-01 | Discharge: 2020-03-01 | Disposition: A | Discharge status: Home / Self Care | Payer: Medicare Other | Source: Ambulatory Visit | Attending: Radiation Oncology | Admitting: Radiation Oncology

## 2020-03-01 ENCOUNTER — Encounter (HOSPITAL_COMMUNITY): Payer: Self-pay

## 2020-03-01 DIAGNOSIS — Z87891 Personal history of nicotine dependence: Secondary | ICD-10-CM | POA: Diagnosis not present

## 2020-03-01 DIAGNOSIS — C3411 Malignant neoplasm of upper lobe, right bronchus or lung: Secondary | ICD-10-CM | POA: Diagnosis not present

## 2020-03-01 DIAGNOSIS — R590 Localized enlarged lymph nodes: Secondary | ICD-10-CM | POA: Diagnosis not present

## 2020-03-01 DIAGNOSIS — Z510 Encounter for antineoplastic radiation therapy: Secondary | ICD-10-CM | POA: Diagnosis not present

## 2020-03-02 ENCOUNTER — Ambulatory Visit
Admission: RE | Admit: 2020-03-02 | Discharge: 2020-03-02 | Disposition: A | Discharge status: Home / Self Care | Payer: Medicare Other | Source: Ambulatory Visit | Attending: Radiation Oncology | Admitting: Radiation Oncology

## 2020-03-02 ENCOUNTER — Other Ambulatory Visit: Payer: Self-pay

## 2020-03-02 DIAGNOSIS — R590 Localized enlarged lymph nodes: Secondary | ICD-10-CM | POA: Diagnosis not present

## 2020-03-02 DIAGNOSIS — C3411 Malignant neoplasm of upper lobe, right bronchus or lung: Secondary | ICD-10-CM

## 2020-03-02 DIAGNOSIS — Z87891 Personal history of nicotine dependence: Secondary | ICD-10-CM | POA: Diagnosis not present

## 2020-03-02 DIAGNOSIS — Z510 Encounter for antineoplastic radiation therapy: Secondary | ICD-10-CM | POA: Diagnosis not present

## 2020-03-05 ENCOUNTER — Ambulatory Visit
Admission: RE | Admit: 2020-03-05 | Discharge: 2020-03-05 | Disposition: A | Discharge status: Home / Self Care | Payer: Medicare Other | Source: Ambulatory Visit | Attending: Radiation Oncology | Admitting: Radiation Oncology

## 2020-03-05 DIAGNOSIS — C3411 Malignant neoplasm of upper lobe, right bronchus or lung: Secondary | ICD-10-CM | POA: Diagnosis not present

## 2020-03-05 DIAGNOSIS — Z510 Encounter for antineoplastic radiation therapy: Secondary | ICD-10-CM | POA: Diagnosis not present

## 2020-03-05 DIAGNOSIS — Z87891 Personal history of nicotine dependence: Secondary | ICD-10-CM | POA: Diagnosis not present

## 2020-03-05 DIAGNOSIS — R590 Localized enlarged lymph nodes: Secondary | ICD-10-CM | POA: Diagnosis not present

## 2020-03-06 ENCOUNTER — Inpatient Hospital Stay: Payer: Medicare Other

## 2020-03-06 ENCOUNTER — Ambulatory Visit
Admission: RE | Admit: 2020-03-06 | Discharge: 2020-03-06 | Disposition: A | Discharge status: Home / Self Care | Payer: Medicare Other | Source: Ambulatory Visit | Attending: Radiation Oncology | Admitting: Radiation Oncology

## 2020-03-06 ENCOUNTER — Inpatient Hospital Stay: Payer: Medicare Other | Admitting: Physician Assistant

## 2020-03-06 ENCOUNTER — Other Ambulatory Visit: Payer: Self-pay

## 2020-03-06 ENCOUNTER — Ambulatory Visit (HOSPITAL_COMMUNITY)
Admission: RE | Admit: 2020-03-06 | Discharge: 2020-03-06 | Disposition: A | Discharge status: Home / Self Care | Payer: Medicare Other | Source: Ambulatory Visit | Attending: Thoracic Surgery (Cardiothoracic Vascular Surgery) | Admitting: Thoracic Surgery (Cardiothoracic Vascular Surgery)

## 2020-03-06 VITALS — BP 131/70 | HR 75 | Temp 97.5°F | Resp 17 | Ht 69.5 in | Wt 256.6 lb

## 2020-03-06 DIAGNOSIS — C3411 Malignant neoplasm of upper lobe, right bronchus or lung: Secondary | ICD-10-CM | POA: Diagnosis not present

## 2020-03-06 DIAGNOSIS — R0609 Other forms of dyspnea: Secondary | ICD-10-CM | POA: Diagnosis not present

## 2020-03-06 DIAGNOSIS — Z510 Encounter for antineoplastic radiation therapy: Secondary | ICD-10-CM | POA: Diagnosis not present

## 2020-03-06 DIAGNOSIS — Z79899 Other long term (current) drug therapy: Secondary | ICD-10-CM | POA: Diagnosis not present

## 2020-03-06 DIAGNOSIS — I700 Atherosclerosis of aorta: Secondary | ICD-10-CM | POA: Diagnosis not present

## 2020-03-06 DIAGNOSIS — Z5111 Encounter for antineoplastic chemotherapy: Secondary | ICD-10-CM

## 2020-03-06 DIAGNOSIS — K118 Other diseases of salivary glands: Secondary | ICD-10-CM | POA: Diagnosis not present

## 2020-03-06 DIAGNOSIS — E119 Type 2 diabetes mellitus without complications: Secondary | ICD-10-CM | POA: Diagnosis not present

## 2020-03-06 DIAGNOSIS — K30 Functional dyspepsia: Secondary | ICD-10-CM | POA: Diagnosis not present

## 2020-03-06 DIAGNOSIS — N200 Calculus of kidney: Secondary | ICD-10-CM | POA: Diagnosis not present

## 2020-03-06 DIAGNOSIS — Z87891 Personal history of nicotine dependence: Secondary | ICD-10-CM | POA: Diagnosis not present

## 2020-03-06 DIAGNOSIS — R590 Localized enlarged lymph nodes: Secondary | ICD-10-CM | POA: Diagnosis not present

## 2020-03-06 DIAGNOSIS — E041 Nontoxic single thyroid nodule: Secondary | ICD-10-CM | POA: Diagnosis not present

## 2020-03-06 DIAGNOSIS — R918 Other nonspecific abnormal finding of lung field: Secondary | ICD-10-CM | POA: Diagnosis not present

## 2020-03-06 LAB — CMP (CANCER CENTER ONLY)
ALT: 42 U/L (ref 0–44)
AST: 25 U/L (ref 15–41)
Albumin: 2.8 g/dL — ABNORMAL LOW (ref 3.5–5.0)
Alkaline Phosphatase: 74 U/L (ref 38–126)
Anion gap: 8 (ref 5–15)
BUN: 15 mg/dL (ref 8–23)
CO2: 25 mmol/L (ref 22–32)
Calcium: 9 mg/dL (ref 8.9–10.3)
Chloride: 105 mmol/L (ref 98–111)
Creatinine: 0.77 mg/dL (ref 0.61–1.24)
GFR, Estimated: >60 mL/min (ref >60)
Glucose, Bld: 148 mg/dL — ABNORMAL HIGH (ref 70–99)
Potassium: 4 mmol/L (ref 3.5–5.1)
Sodium: 138 mmol/L (ref 135–145)
Total Bilirubin: 0.6 mg/dL (ref 0.3–1.2)
Total Protein: 6.8 g/dL (ref 6.5–8.1)

## 2020-03-06 LAB — CBC WITH DIFFERENTIAL (CANCER CENTER ONLY)
Abs Immature Granulocytes: 0.05 10*3/uL (ref 0.00–0.07)
Basophils Absolute: 0 10*3/uL (ref 0.0–0.1)
Basophils Relative: 0 %
Eosinophils Absolute: 0.1 10*3/uL (ref 0.0–0.5)
Eosinophils Relative: 1 %
HCT: 36.8 % — ABNORMAL LOW (ref 39.0–52.0)
Hemoglobin: 12.2 g/dL — ABNORMAL LOW (ref 13.0–17.0)
Immature Granulocytes: 1 %
Lymphocytes Relative: 7 %
Lymphs Abs: 0.5 10*3/uL — ABNORMAL LOW (ref 0.7–4.0)
MCH: 26.1 pg (ref 26.0–34.0)
MCHC: 33.2 g/dL (ref 30.0–36.0)
MCV: 78.6 fL — ABNORMAL LOW (ref 80.0–100.0)
Monocytes Absolute: 0.5 10*3/uL (ref 0.1–1.0)
Monocytes Relative: 8 %
Neutro Abs: 5.6 10*3/uL (ref 1.7–7.7)
Neutrophils Relative %: 83 %
Platelet Count: 262 10*3/uL (ref 150–400)
RBC: 4.68 MIL/uL (ref 4.22–5.81)
RDW: 15.1 % (ref 11.5–15.5)
WBC Count: 6.7 10*3/uL (ref 4.0–10.5)
nRBC: 0 % (ref 0.0–0.2)

## 2020-03-06 MED ORDER — IOHEXOL 300 MG/ML  SOLN
75.0000 mL | Freq: Once | INTRAMUSCULAR | Status: AC | PRN
  Administered 2020-03-06: 75 mL via INTRAVENOUS

## 2020-03-06 MED ORDER — DIPHENHYDRAMINE HCL 50 MG/ML IJ SOLN
50.0000 mg | Freq: Once | INTRAMUSCULAR | Status: AC
  Administered 2020-03-06: 50 mg via INTRAVENOUS

## 2020-03-06 MED ORDER — SODIUM CHLORIDE 0.9 % IV SOLN
20.0000 mg | Freq: Once | INTRAVENOUS | Status: AC
  Administered 2020-03-06: 20 mg via INTRAVENOUS
  Filled 2020-03-06: qty 20

## 2020-03-06 MED ORDER — PALONOSETRON HCL INJECTION 0.25 MG/5ML
INTRAVENOUS | Status: AC
  Filled 2020-03-06: qty 5

## 2020-03-06 MED ORDER — SODIUM CHLORIDE 0.9 % IV SOLN
263.6000 mg | Freq: Once | INTRAVENOUS | Status: AC
  Administered 2020-03-06: 260 mg via INTRAVENOUS
  Filled 2020-03-06: qty 26

## 2020-03-06 MED ORDER — PALONOSETRON HCL INJECTION 0.25 MG/5ML
0.2500 mg | Freq: Once | INTRAVENOUS | Status: AC
  Administered 2020-03-06: 0.25 mg via INTRAVENOUS

## 2020-03-06 MED ORDER — DIPHENHYDRAMINE HCL 50 MG/ML IJ SOLN
INTRAMUSCULAR | Status: AC
  Filled 2020-03-06: qty 1

## 2020-03-06 MED ORDER — SODIUM CHLORIDE 0.9 % IV SOLN
Freq: Once | INTRAVENOUS | Status: AC
  Filled 2020-03-06: qty 250

## 2020-03-06 MED ORDER — FAMOTIDINE IN NACL 20-0.9 MG/50ML-% IV SOLN
INTRAVENOUS | Status: AC
  Filled 2020-03-06: qty 50

## 2020-03-06 MED ORDER — FAMOTIDINE IN NACL 20-0.9 MG/50ML-% IV SOLN
20.0000 mg | Freq: Once | INTRAVENOUS | Status: AC
  Administered 2020-03-06: 20 mg via INTRAVENOUS

## 2020-03-06 MED ORDER — SODIUM CHLORIDE 0.9 % IV SOLN
45.0000 mg/m2 | Freq: Once | INTRAVENOUS | Status: AC
  Administered 2020-03-06: 108 mg via INTRAVENOUS
  Filled 2020-03-06: qty 18

## 2020-03-06 NOTE — Patient Instructions (Signed)
[facility] Discharge Instructions for Patients Receiving Chemotherapy  Today you received the following chemotherapy agents: paclitaxel, carboplatin.  To help prevent nausea and vomiting after your treatment, we encourage you to take your nausea medication as directed.    If you develop nausea and vomiting that is not controlled by your nausea medication, call the clinic.   BELOW ARE SYMPTOMS THAT SHOULD BE REPORTED IMMEDIATELY:  *FEVER GREATER THAN 100.5 F  *CHILLS WITH OR WITHOUT FEVER  NAUSEA AND VOMITING THAT IS NOT CONTROLLED WITH YOUR NAUSEA MEDICATION  *UNUSUAL SHORTNESS OF BREATH  *UNUSUAL BRUISING OR BLEEDING  TENDERNESS IN MOUTH AND THROAT WITH OR WITHOUT PRESENCE OF ULCERS  *URINARY PROBLEMS  *BOWEL PROBLEMS  UNUSUAL RASH Items with * indicate a potential emergency and should be followed up as soon as possible.  Feel free to call the clinic should you have any questions or concerns. The clinic phone number is [phone].  Please show the [facility] at check-in to the Emergency Department and triage nurse.

## 2020-03-07 ENCOUNTER — Ambulatory Visit
Admission: RE | Admit: 2020-03-07 | Discharge: 2020-03-07 | Disposition: A | Discharge status: Home / Self Care | Payer: Medicare Other | Source: Ambulatory Visit | Attending: Radiation Oncology | Admitting: Radiation Oncology

## 2020-03-07 ENCOUNTER — Other Ambulatory Visit: Payer: Self-pay | Admitting: Radiology

## 2020-03-07 ENCOUNTER — Encounter (HOSPITAL_COMMUNITY): Payer: Self-pay | Admitting: Radiology

## 2020-03-07 DIAGNOSIS — R590 Localized enlarged lymph nodes: Secondary | ICD-10-CM | POA: Diagnosis not present

## 2020-03-07 DIAGNOSIS — Z87891 Personal history of nicotine dependence: Secondary | ICD-10-CM | POA: Diagnosis not present

## 2020-03-07 DIAGNOSIS — C3411 Malignant neoplasm of upper lobe, right bronchus or lung: Secondary | ICD-10-CM | POA: Diagnosis not present

## 2020-03-07 DIAGNOSIS — Z510 Encounter for antineoplastic radiation therapy: Secondary | ICD-10-CM | POA: Diagnosis not present

## 2020-03-07 NOTE — Progress Notes (Signed)
Billy Hall. [place] Male, 74 y.o., 1945/12/25 MRN:  [id number] Phone:  [phone] (H) PCP:  [name], MD Coverage:  [facility] ([facility]) 03/08/2020 at 2:30 PM  RE: STAT: [place] FNA SALIVARY GLAND/PAROTID GLAND Received: Today Suttle, Rosanne Ashing, MD  [name] D Given localization on CT, this would likely be amenable to ultrasound guided biopsy. Recommend attempting with ultrasound, with possibility of needing CT guidance.   Left posterior parotid FDG avid and enhancing mass, 6x10 mm.   [name]       Previous Messages   ----- Message -----  From: [name] D  Sent: 03/07/2020  9:02 AM EST  To: Suzette Battiest, MD  Subject: FW: STAT: [place] FNA SALIVARY GLAND/PAROTID GLA*   Good morning, CT has been completed.  ----- Message -----  From: Suzette Battiest, MD  Sent: 02/20/2020 10:31 AM EST  To: Jillyn Hidden  Subject: RE: STAT: [place] FNA SALIVARY GLAND/PAROTID GLA*   Lesion unfortunately invisible on ultrasound. Recommend CT neck with contrast.   [name]  ----- Message -----  From: [name] D  Sent: 02/20/2020  8:28 AM EDT  To: Suzette Battiest, MD  Subject: RE: STAT: [place] FNA SALIVARY GLAND/PAROTID GLA*   US done and ready for review again, thanks.  ----- Message -----  From: Suzette Battiest, MD  Sent: 02/09/2020 12:18 PM EDT  To: Jillyn Hidden  Subject: RE: STAT: [place] FNA SALIVARY GLAND/PAROTID GLA*   Difficult to visualize lesion on CT. Recommend ultrasound of parotid gland prior to scheduling.   [name]  ----- Message -----  From: [name] D  Sent: 02/07/2020  2:39 PM EDT  To: [facility] Procedure Requests  Subject: STAT: [place] FNA SALIVARY GLAND/PAROTID GLAND    Procedure: [place] FNA SALIVARY GLAND/PAROTID GLAND   Reason: Parotid gland fullness, needle aspiration of left parotid gland lesion seen on PET   History: NM PET, CT in computer   Provider: [place] Charon C     Provider Contact: [phone]

## 2020-03-08 ENCOUNTER — Encounter (HOSPITAL_COMMUNITY): Payer: Self-pay

## 2020-03-08 ENCOUNTER — Other Ambulatory Visit: Payer: Self-pay

## 2020-03-08 ENCOUNTER — Other Ambulatory Visit: Payer: Self-pay | Admitting: Thoracic Surgery (Cardiothoracic Vascular Surgery)

## 2020-03-08 ENCOUNTER — Other Ambulatory Visit: Payer: Self-pay | Admitting: Internal Medicine

## 2020-03-08 ENCOUNTER — Ambulatory Visit (HOSPITAL_COMMUNITY)
Admission: RE | Admit: 2020-03-08 | Discharge: 2020-03-08 | Disposition: A | Discharge status: Home / Self Care | Payer: Medicare Other | Source: Ambulatory Visit | Attending: Thoracic Surgery (Cardiothoracic Vascular Surgery) | Admitting: Thoracic Surgery (Cardiothoracic Vascular Surgery)

## 2020-03-08 ENCOUNTER — Ambulatory Visit
Admission: RE | Admit: 2020-03-08 | Discharge: 2020-03-08 | Disposition: A | Discharge status: Home / Self Care | Payer: Medicare Other | Source: Ambulatory Visit | Attending: Radiation Oncology | Admitting: Radiation Oncology

## 2020-03-08 ENCOUNTER — Ambulatory Visit (HOSPITAL_COMMUNITY)
Admission: RE | Admit: 2020-03-08 | Discharge: 2020-03-08 | Disposition: A | Discharge status: Home / Self Care | Payer: Medicare Other | Source: Ambulatory Visit | Attending: Internal Medicine | Admitting: Internal Medicine

## 2020-03-08 DIAGNOSIS — D110 Benign neoplasm of parotid gland: Secondary | ICD-10-CM | POA: Diagnosis not present

## 2020-03-08 DIAGNOSIS — K118 Other diseases of salivary glands: Secondary | ICD-10-CM | POA: Diagnosis not present

## 2020-03-08 DIAGNOSIS — C3491 Malignant neoplasm of unspecified part of right bronchus or lung: Secondary | ICD-10-CM | POA: Diagnosis not present

## 2020-03-08 DIAGNOSIS — Z7984 Long term (current) use of oral hypoglycemic drugs: Secondary | ICD-10-CM | POA: Insufficient documentation

## 2020-03-08 DIAGNOSIS — Z888 Allergy status to other drugs, medicaments and biological substances status: Secondary | ICD-10-CM | POA: Diagnosis not present

## 2020-03-08 DIAGNOSIS — Z452 Encounter for adjustment and management of vascular access device: Secondary | ICD-10-CM | POA: Diagnosis not present

## 2020-03-08 DIAGNOSIS — C3490 Malignant neoplasm of unspecified part of unspecified bronchus or lung: Secondary | ICD-10-CM | POA: Diagnosis not present

## 2020-03-08 DIAGNOSIS — Z7982 Long term (current) use of aspirin: Secondary | ICD-10-CM | POA: Diagnosis not present

## 2020-03-08 DIAGNOSIS — Z87891 Personal history of nicotine dependence: Secondary | ICD-10-CM | POA: Diagnosis not present

## 2020-03-08 DIAGNOSIS — C3411 Malignant neoplasm of upper lobe, right bronchus or lung: Secondary | ICD-10-CM

## 2020-03-08 DIAGNOSIS — R590 Localized enlarged lymph nodes: Secondary | ICD-10-CM | POA: Diagnosis not present

## 2020-03-08 DIAGNOSIS — Z510 Encounter for antineoplastic radiation therapy: Secondary | ICD-10-CM | POA: Diagnosis not present

## 2020-03-08 DIAGNOSIS — Z79899 Other long term (current) drug therapy: Secondary | ICD-10-CM | POA: Insufficient documentation

## 2020-03-08 HISTORY — PX: IR IMAGING GUIDED PORT INSERTION: IMG5740

## 2020-03-08 HISTORY — PX: IR US GUIDE BX ASP/DRAIN: IMG2392

## 2020-03-08 LAB — GLUCOSE, CAPILLARY: Glucose-Capillary: 109 mg/dL — ABNORMAL HIGH (ref 70–99)

## 2020-03-08 LAB — CBC WITH DIFFERENTIAL/PLATELET
Abs Immature Granulocytes: 0.03 10*3/uL (ref 0.00–0.07)
Basophils Absolute: 0 10*3/uL (ref 0.0–0.1)
Basophils Relative: 1 %
Eosinophils Absolute: 0 10*3/uL (ref 0.0–0.5)
Eosinophils Relative: 1 %
HCT: 41.6 % (ref 39.0–52.0)
Hemoglobin: 13.3 g/dL (ref 13.0–17.0)
Immature Granulocytes: 1 %
Lymphocytes Relative: 7 %
Lymphs Abs: 0.5 10*3/uL — ABNORMAL LOW (ref 0.7–4.0)
MCH: 26 pg (ref 26.0–34.0)
MCHC: 32 g/dL (ref 30.0–36.0)
MCV: 81.4 fL (ref 80.0–100.0)
Monocytes Absolute: 0.4 10*3/uL (ref 0.1–1.0)
Monocytes Relative: 6 %
Neutro Abs: 5.7 10*3/uL (ref 1.7–7.7)
Neutrophils Relative %: 84 %
Platelets: 271 10*3/uL (ref 150–400)
RBC: 5.11 MIL/uL (ref 4.22–5.81)
RDW: 15.4 % (ref 11.5–15.5)
WBC: 6.7 10*3/uL (ref 4.0–10.5)
nRBC: 0 % (ref 0.0–0.2)

## 2020-03-08 LAB — PROTIME-INR
INR: 1 (ref 0.8–1.2)
Prothrombin Time: 12.9 seconds (ref 11.4–15.2)

## 2020-03-08 MED ORDER — FENTANYL CITRATE (PF) 100 MCG/2ML IJ SOLN
INTRAMUSCULAR | Status: AC | PRN
  Administered 2020-03-08 (×4): 25 ug via INTRAVENOUS

## 2020-03-08 MED ORDER — LIDOCAINE-EPINEPHRINE 1 %-1:100000 IJ SOLN
INTRAMUSCULAR | Status: AC
  Filled 2020-03-08: qty 1

## 2020-03-08 MED ORDER — HEPARIN SOD (PORK) LOCK FLUSH 100 UNIT/ML IV SOLN
INTRAVENOUS | Status: AC | PRN
  Administered 2020-03-08: 500 [IU]

## 2020-03-08 MED ORDER — CEFAZOLIN SODIUM-DEXTROSE 2-4 GM/100ML-% IV SOLN
INTRAVENOUS | Status: AC
  Filled 2020-03-08: qty 100

## 2020-03-08 MED ORDER — LIDOCAINE HCL 1 % IJ SOLN
INTRAMUSCULAR | Status: AC
  Filled 2020-03-08: qty 20

## 2020-03-08 MED ORDER — SODIUM CHLORIDE (PF) 0.9 % IJ SOLN
INTRAMUSCULAR | Status: AC
  Filled 2020-03-08: qty 10

## 2020-03-08 MED ORDER — MIDAZOLAM HCL 2 MG/2ML IJ SOLN
INTRAMUSCULAR | Status: AC
  Filled 2020-03-08: qty 4

## 2020-03-08 MED ORDER — SODIUM CHLORIDE 0.9 % IV SOLN
INTRAVENOUS | Status: AC | PRN
  Administered 2020-03-08: 10 mL/h via INTRAVENOUS

## 2020-03-08 MED ORDER — HEPARIN SOD (PORK) LOCK FLUSH 100 UNIT/ML IV SOLN
INTRAVENOUS | Status: AC
  Filled 2020-03-08: qty 5

## 2020-03-08 MED ORDER — SODIUM CHLORIDE 0.9 % IV SOLN: INTRAVENOUS | Status: DC

## 2020-03-08 MED ORDER — MIDAZOLAM HCL 2 MG/2ML IJ SOLN
INTRAMUSCULAR | Status: AC | PRN
  Administered 2020-03-08: 1 mg via INTRAVENOUS
  Administered 2020-03-08: 0.5 mg via INTRAVENOUS
  Administered 2020-03-08: 1 mg via INTRAVENOUS
  Administered 2020-03-08: 0.5 mg via INTRAVENOUS

## 2020-03-08 MED ORDER — FENTANYL CITRATE (PF) 100 MCG/2ML IJ SOLN
INTRAMUSCULAR | Status: AC
  Filled 2020-03-08: qty 2

## 2020-03-08 MED ORDER — CEFAZOLIN SODIUM-DEXTROSE 2-4 GM/100ML-% IV SOLN
2.0000 g | INTRAVENOUS | Status: AC
  Administered 2020-03-08: 2 g via INTRAVENOUS

## 2020-03-08 NOTE — Sedation Documentation (Signed)
Patient is resting comfortably. 

## 2020-03-08 NOTE — Discharge Instructions (Signed)
Do not use the lidocaine cream on your new port until it has healed. The petroleum in the cream will dissolve the skin glue. You will get an infection in your new port. Use ice in a zip lock bag over your new port for 1-2 minutes for your 03-12-20 infusion.    Implanted Port Insertion, Care After This sheet gives you information about how to care for yourself after your procedure. Your health care provider may also give you more specific instructions. If you have problems or questions, contact your health care provider. What can I expect after the procedure? After the procedure, it is common to have:  Discomfort at the port insertion site.  Bruising on the skin over the port. This should improve over 3-4 days. Follow these instructions at home: [facility] care  After your port is placed, you will get a manufacturer's information card. The card has information about your port. Keep this card with you at all times.  Make sure to remember what type of port you have. Incision care      Follow instructions from your health care provider about how to take care of your port insertion site. Make sure you: ? Wash your hands with soap and water before and after you change your bandage (dressing). If soap and water are not available, use hand sanitizer. ? Change your dressing as told by your health care provider. ? Leave skin glue in place. These skin closures may need to stay in place for 2 weeks or longer.  Check your port insertion site every day for signs of infection. Check for: ? Redness, swelling, or pain. ? Fluid or blood. ? Warmth. ? Pus or a bad smell. Activity  Return to your normal activities as told by your health care provider. Ask your health care provider what activities are safe for you.  Do not lift anything that is heavier than 10 lb (4.5 kg), or the limit that you are told, until your health care provider says that it is safe. General instructions  Take over-the-counter and  prescription medicines only as told by your health care provider.  Do not take baths, swim, or use a hot tub until your health care provider approves. You may shower tomorrow around 3 PM and remove the gauze and transparent dressing. No need for a new dressing as the skin glue will cover the port.  Do not drive for 24 hours if you were given a sedative during your procedure.  Wear a medical alert bracelet in case of an emergency. This will tell any health care providers that you have a port.  Keep all follow-up visits as told by your health care provider. This is important. Contact a health care provider if:  You cannot flush your port with saline as directed, or you cannot draw blood from the port.  You have a fever or chills.  You have redness, swelling, or pain around your port insertion site.  You have fluid or blood coming from your port insertion site.  Your port insertion site feels warm to the touch.  You have pus or a bad smell coming from the port insertion site. Get help right away if:  You have chest pain or shortness of breath.  You have bleeding from your port that you cannot control. Summary  Take care of the port as told by your health care provider. Keep the manufacturer's information card with you at all times.  Change your dressing as told by your health  care provider.  Contact a health care provider if you have a fever or chills or if you have redness, swelling, or pain around your port insertion site.  Keep all follow-up visits as told by your health care provider. This information is not intended to replace advice given to you by your health care provider. Make sure you discuss any questions you have with your health care provider. Document Revised: 11/10/2017 Document Reviewed: 11/10/2017 Elsevier Patient Education  [place].      Needle Biopsy, Care After This sheet gives you information about how to care for yourself after your  procedure. Your health care provider may also give you more specific instructions. If you have problems or questions, contact your health care provider. What can I expect after the procedure? After the procedure, it is common to have soreness, bruising, or mild pain at the puncture site. This should go away in a few days. Follow these instructions at home: Needle insertion site care   Wash your hands with soap and water before you change your bandage (dressing). If you cannot use soap and water, use hand sanitizer.  Follow instructions from your health care provider about how to take care of your puncture site. This includes: ? When and how to change your dressing. ? When to remove your dressing.  Check your puncture site every day for signs of infection. Check for: ? Redness, swelling, or pain. ? Fluid or blood. ? Pus or a bad smell. ? Warmth. General instructions  Return to your normal activities as told by your health care provider. Ask your health care provider what activities are safe for you.  Do not take baths, swim, or use a hot tub until your health care provider approves. Ask your health care provider if you may take showers. You may only be allowed to take sponge baths.  Take over-the-counter and prescription medicines only as told by your health care provider.  Keep all follow-up visits as told by your health care provider. This is important. Contact a health care provider if:  You have a fever.  You have redness, swelling, or pain at the puncture site that lasts longer than a few days.  You have fluid, blood, or pus coming from your puncture site.  Your puncture site feels warm to the touch. Get help right away if:  You have severe bleeding from the puncture site. Summary  After the procedure, it is common to have soreness, bruising, or mild pain at the puncture site. This should go away in a few days.  Check your puncture site every day for signs of infection,  such as redness, swelling, or pain.  Get help right away if you have severe bleeding from your puncture site. This information is not intended to replace advice given to you by your health care provider. Make sure you discuss any questions you have with your health care provider. Document Revised: 06/26/2017 Document Reviewed: 04/27/2017 Elsevier Patient Education  [place].    Moderate Conscious Sedation, Adult, Care After These instructions provide you with information about caring for yourself after your procedure. Your health care provider may also give you more specific instructions. Your treatment has been planned according to current medical practices, but problems sometimes occur. Call your health care provider if you have any problems or questions after your procedure. What can I expect after the procedure? After your procedure, it is common:  To feel sleepy for several hours.  To feel clumsy and  have poor balance for several hours.  To have poor judgment for several hours.  To vomit if you eat too soon. Follow these instructions at home: For at least 24 hours after the procedure:   Do not: ? Participate in activities where you could fall or become injured. ? Drive. ? Use heavy machinery. ? Drink alcohol. ? Take sleeping pills or medicines that cause drowsiness. ? Make important decisions or sign legal documents. ? Take care of children on your own.  Rest. Eating and drinking  Follow the diet recommended by your health care provider.  If you vomit: ? Drink water, juice, or soup when you can drink without vomiting. ? Make sure you have little or no nausea before eating solid foods. General instructions  Have a responsible adult stay with you until you are awake and alert.  Take over-the-counter and prescription medicines only as told by your health care provider.  If you smoke, do not smoke without supervision.  Keep all follow-up visits as told by  your health care provider. This is important. Contact a health care provider if:  You keep feeling nauseous or you keep vomiting.  You feel light-headed.  You develop a rash.  You have a fever. Get help right away if:  You have trouble breathing. This information is not intended to replace advice given to you by your health care provider. Make sure you discuss any questions you have with your health care provider. Document Revised: 03/27/2017 Document Reviewed: 08/04/2015 Elsevier Patient Education  2020 [employer].

## 2020-03-08 NOTE — Sedation Documentation (Signed)
Parotid Mass biopsy completed. Bandaid placed to the left neck. Pt tolerated well without distress or difficulty.

## 2020-03-08 NOTE — Consult Note (Signed)
Chief Complaint: Patient was seen in consultation today for image guided left parotid mass biopsy and Port-A-Cath placement.  Referring Physician(s): Mohamed,Mohamed  Supervising Physician: Sandi Mariscal  Patient Status: [facility] - Out-pt  History of Present Illness: Billy Hall is a 74 y.o. male , ex smoker, with history of arthritis, depression, diabetes, hyperlipidemia, hypertension, hypothyroidism and newly diagnosed squamous cell carcinoma of the right lung, currently undergoing chemoradiation.  Prior imaging has also revealed a 1 cm enhancing nodule along the posterior margin of the left parotid gland possibly representing Warthin's tumor versus metastatic lymph node.  He presents today for image guided left parotid mass biopsy as well as Port-A-Cath placement for chemotherapy administration.  Past Medical History:  Diagnosis Date  . Arthritis   . Depressive disorder   . Diabetes mellitus without complication ([place])   . Fatigue   . Headache(784.0)   . Hyperlipidemia   . Hypertension   . Hypothyroidism   . Snoring     Past Surgical History:  Procedure Laterality Date  . COLONOSCOPY N/A 03/08/2013   Procedure: COLONOSCOPY;  Surgeon: Jamesetta So, MD;  Location: [facility] SUITE;  Service: Gastroenterology;  Laterality: N/A;  . KNEE ARTHROSCOPY Right    2006 By Dr. Luna Glasgow at [facility].   . THYROID SURGERY    . VIDEO BRONCHOSCOPY WITH ENDOBRONCHIAL ULTRASOUND N/A 01/30/2020   Procedure: VIDEO BRONCHOSCOPY WITH ENDOBRONCHIAL ULTRASOUND;  Surgeon: Melrose Nakayama, MD;  Location: [facility] OR;  Service: Thoracic;  Laterality: N/A;    Allergies: Ace inhibitors  Medications: Prior to Admission medications   Medication Sig Start Date End Date Taking? Authorizing Provider  acetaminophen (TYLENOL 8 HOUR) 650 MG CR tablet Take 650 mg by mouth every 8 (eight) hours as needed for pain.   Yes [provider]  aspirin EC 81 MG tablet Take 81 mg by mouth daily.   Yes [provider]  cholecalciferol (VITAMIN D3) 25 MCG (1000 UNIT) tablet Take 1,000 Units by mouth daily.   Yes [provider]  levothyroxine (SYNTHROID) 88 MCG tablet Take 88 mcg by mouth daily before breakfast.    Yes [provider]  losartan (COZAAR) 50 MG tablet Take 50 mg by mouth daily.    Yes [provider]  metFORMIN (GLUCOPHAGE) 500 MG tablet Take 500 mg by mouth 2 (two) times daily with a meal.    Yes [provider]  ONETOUCH ULTRA test strip 1 each 2 (two) times daily. 03/03/20  Yes [provider]  prochlorperazine (COMPAZINE) 10 MG tablet Take 1 tablet (10 mg total) by mouth every 6 (six) hours as needed for nausea or vomiting. 02/20/20  Yes Curt Bears, MD  sildenafil (REVATIO) 20 MG tablet Take 20 mg by mouth daily as needed (ED).    Yes [provider]  simvastatin (ZOCOR) 10 MG tablet Take 10 mg by mouth every evening.    Yes [provider]  tamsulosin (FLOMAX) 0.4 MG CAPS capsule Take 0.4 mg by mouth daily.   Yes [provider]     Family History  Problem Relation Age of Onset  . Alzheimer's disease Mother     Social History   Socioeconomic History  . Marital status: Widowed    Spouse name: Not on file  . Number of children: 1  . Years of education: High Schol  . Highest education level: Not on file  Occupational History  . Occupation: Retired  Tobacco Use  . Smoking status: Former Research scientist (life sciences)  .  Smokeless tobacco: Never Used  . Tobacco comment: Quit 2004  Vaping Use  . Vaping Use: Never used  Substance and Sexual Activity  . Alcohol use: Not Currently    Alcohol/week: 0.0 standard drinks    Comment: has quit since 2001  . Drug use: No  . Sexual activity: Not on file  Other Topics Concern  . Not on file  Social History Narrative   I cup of coffee a day, drinks some Green tea   Social Determinants of Health   Financial Resource Strain:   . Difficulty of Paying Living Expenses:  Not on file  Food Insecurity:   . Worried About Charity fundraiser in the Last Year: Not on file  . Ran Out of Food in the Last Year: Not on file  Transportation Needs:   . Lack of Transportation (Medical): Not on file  . Lack of Transportation (Non-Medical): Not on file  Physical Activity:   . Days of Exercise per Week: Not on file  . Minutes of Exercise per Session: Not on file  Stress:   . Feeling of Stress : Not on file  Social Connections:   . Frequency of Communication with Friends and Family: Not on file  . Frequency of Social Gatherings with Friends and Family: Not on file  . Attends Religious Services: Not on file  . Active Member of Clubs or Organizations: Not on file  . Attends Archivist Meetings: Not on file  . Marital Status: Not on file     Review of Systems denies fever, headache, chest pain, abdominal pain, back pain, nausea, vomiting or bleeding.  Does have some dyspnea with exertion, occasional cough and some indigestion  Vital Signs: BP (!) 143/90 (BP Location: Right Arm)   Pulse 86   Temp 98.3 F (36.8 C) (Oral)   Resp 18   Ht 5\' 9"  (1.753 m)   Wt 256 lb (116.1 kg)   SpO2 96%   BMI 37.80 kg/m   Physical Exam awake, alert.  Chest with few scattered rhonchi, heart with regular rate and rhythm.  Abdomen soft, positive bowel sounds, nontender.  Extremities with full range of motion.  Imaging: CT SOFT TISSUE NECK W CONTRAST  Result Date: 03/06/2020 CLINICAL DATA:  Malignant neoplasm right upper lobe. Left parotid mass. EXAM: CT NECK WITH CONTRAST TECHNIQUE: Multidetector CT imaging of the neck was performed using the standard protocol following the bolus administration of intravenous contrast. CONTRAST:  16mL OMNIPAQUE IOHEXOL 300 MG/ML  SOLN COMPARISON:  PET-CT 02/06/2020 FINDINGS: Pharynx and larynx: Normal. No mass or swelling. Salivary glands: Enhancing soft tissue nodule along the posterior margin of the left parotid gland corresponds to the  PET finding. This nodule measures 6 x 10 mm this is hypermetabolic on PET and shows homogeneous enhancement on CT. This could be a parotid tumor such as Warthin's tumor or adjacent lymph node. Right parotid normal.  Submandibular normal bilaterally. Thyroid: Parotidectomy. Small amount residual parotid tissue bilaterally without mass lesion. Lymph nodes: Possible lymph node versus parotid tumor on the left as described above. Otherwise no enlarged lymph nodes. Vascular: Normal vascular enhancement. Limited intracranial: Negative Visualized orbits: Negative Mastoids and visualized paranasal sinuses: Mild mucosal edema paranasal sinuses. Prior sinus surgery right maxillary sinus. Skeleton: No acute skeletal abnormality.  Mild cervical spondylosis. Upper chest: Large right upper lobe mass lesion as noted on prior PET-CT. Left upper lobe clear. Other: None IMPRESSION: 6 x 10 mm enhancing nodule along the posterior margin of the  left parotid gland. This could represent a Warthin's tumor. Warthin's tumors can be hypermetabolic on PET as is this lesion. Also possible is a metastatic lymph node. Tissue sampling recommended. Right upper lobe mass compatible with carcinoma. Electronically Signed   By: Franchot Gallo M.D.   On: 03/06/2020 13:26   US SOFT TISSUE HEAD & NECK (NON-THYROID)  Result Date: 02/17/2020 CLINICAL DATA:  Initial evaluation for asymmetric left parotid gland fullness on recent PET-CT. Evaluate for possible mass. EXAM: ULTRASOUND OF HEAD/NECK SOFT TISSUES TECHNIQUE: Ultrasound examination of the head and neck soft tissues was performed in the area of clinical concern. COMPARISON:  Prior PET-CT from 02/06/2020. FINDINGS: Targeted ultrasound of both parotid glands was performed. No discrete mass lesion identified within either parotid gland. Specifically, no appreciable discrete lesion is seen within the left parotid gland to correspond with area of focal increased FDG uptake seen on prior PET-CT. No  visible adenopathy. The glandular tissue itself is normal in appearance without evidence for acute parotitis. No abnormal ductal dilatation or evidence for sialolithiasis. IMPRESSION: Negative ultrasound of the parotid glands. No discrete mass or other abnormality identified. Electronically Signed   By: Jeannine Boga M.D.   On: 02/17/2020 15:12    Labs:  CBC: Recent Labs    02/16/20 1508 02/24/20 0917 03/06/20 0737 03/08/20 1245  WBC 9.5 8.8 6.7 6.7  HGB 13.5 12.7* 12.2* 13.3  HCT 42.3 39.8 36.8* 41.6  PLT 271 263 262 271    COAGS: Recent Labs    01/27/20 0913  INR 1.1  APTT 33    BMP: Recent Labs    01/27/20 0913 02/16/20 1508 02/24/20 0917 03/06/20 0737  NA 137 139 139 138  K 4.1 4.4 4.1 4.0  CL 100 103 104 105  CO2 26 31 27 25   GLUCOSE 143* 87 103* 148*  BUN 10 12 14 15   CALCIUM 9.2 9.8 9.2 9.0  CREATININE 0.83 0.86 0.82 0.77  GFRNONAA >60 >60 >60 >60  GFRAA >60  --   --   --     LIVER FUNCTION TESTS: Recent Labs    01/27/20 0913 02/16/20 1508 02/24/20 0917 03/06/20 0737  BILITOT 0.8 0.4 0.6 0.6  AST 14* 12* 15 25  ALT 13 9 14  42  ALKPHOS 58 67 67 74  PROT 7.3 7.5 7.1 6.8  ALBUMIN 3.5 3.3* 3.0* 2.8*    TUMOR MARKERS: No results for input(s): AFPTM, CEA, CA199, CHROMGRNA in the last 8760 hours.  Assessment and Plan: 74 y.o. male , ex smoker, with history of arthritis, depression, diabetes, hyperlipidemia, hypertension, hypothyroidism and newly diagnosed squamous cell carcinoma of the right lung, currently undergoing chemoradiation.  Prior imaging has also revealed a 1 cm enhancing nodule along the posterior margin of the left parotid gland possibly representing Warthin's tumor versus metastatic lymph node.  He presents today for image guided left parotid mass biopsy as well as Port-A-Cath placement for chemotherapy administration.  Details/risks of procedures, including but not limited to, internal bleeding, infection, injury to adjacent  structures discussed with patient his understanding and consent.   Thank you for this interesting consult.  I greatly enjoyed meeting Billy Hall and look forward to participating in their care.  A copy of this report was sent to the requesting provider on this date.  Electronically Signed: D. Rowe Robert, PA-C 03/08/2020, 1:01 PM   I spent a total of  30 minutes   in face to face in clinical consultation, greater than 50% of which  was counseling/coordinating care for image guided left parotid mass biopsy and Port-A-Cath placement

## 2020-03-08 NOTE — Procedures (Signed)
Pre Procedure Dx: Poor venous access; Indeterminate left sided parotid gland nodule Post Procedural Dx: Same  Technically successful US guided Bx of indeterminate left sided parotid gland nodule.  Successful placement of right IJ approach port-a-cath with tip at the superior caval atrial junction. The catheter is ready for immediate use.  Estimated Blood Loss: Minimal Complications: None immediate.  Ronny Bacon, MD Pager #: [phone]

## 2020-03-09 ENCOUNTER — Ambulatory Visit
Admission: RE | Admit: 2020-03-09 | Discharge: 2020-03-09 | Disposition: A | Discharge status: Home / Self Care | Payer: Medicare Other | Source: Ambulatory Visit | Attending: Radiation Oncology | Admitting: Radiation Oncology

## 2020-03-09 DIAGNOSIS — C3411 Malignant neoplasm of upper lobe, right bronchus or lung: Secondary | ICD-10-CM | POA: Diagnosis not present

## 2020-03-09 DIAGNOSIS — Z87891 Personal history of nicotine dependence: Secondary | ICD-10-CM | POA: Diagnosis not present

## 2020-03-09 DIAGNOSIS — Z510 Encounter for antineoplastic radiation therapy: Secondary | ICD-10-CM | POA: Diagnosis not present

## 2020-03-09 DIAGNOSIS — R590 Localized enlarged lymph nodes: Secondary | ICD-10-CM | POA: Diagnosis not present

## 2020-03-09 LAB — SURGICAL PATHOLOGY

## 2020-03-12 ENCOUNTER — Inpatient Hospital Stay: Payer: Medicare Other

## 2020-03-12 ENCOUNTER — Other Ambulatory Visit: Payer: Self-pay

## 2020-03-12 ENCOUNTER — Ambulatory Visit
Admission: RE | Admit: 2020-03-12 | Discharge: 2020-03-12 | Disposition: A | Discharge status: Home / Self Care | Payer: Medicare Other | Source: Ambulatory Visit | Attending: Radiation Oncology | Admitting: Radiation Oncology

## 2020-03-12 VITALS — BP 123/75 | HR 100 | Temp 98.7°F | Resp 18

## 2020-03-12 DIAGNOSIS — Z87891 Personal history of nicotine dependence: Secondary | ICD-10-CM | POA: Diagnosis not present

## 2020-03-12 DIAGNOSIS — C3411 Malignant neoplasm of upper lobe, right bronchus or lung: Secondary | ICD-10-CM

## 2020-03-12 DIAGNOSIS — R590 Localized enlarged lymph nodes: Secondary | ICD-10-CM | POA: Diagnosis not present

## 2020-03-12 DIAGNOSIS — I700 Atherosclerosis of aorta: Secondary | ICD-10-CM | POA: Diagnosis not present

## 2020-03-12 DIAGNOSIS — R0609 Other forms of dyspnea: Secondary | ICD-10-CM | POA: Diagnosis not present

## 2020-03-12 DIAGNOSIS — Z5111 Encounter for antineoplastic chemotherapy: Secondary | ICD-10-CM | POA: Diagnosis not present

## 2020-03-12 DIAGNOSIS — K30 Functional dyspepsia: Secondary | ICD-10-CM | POA: Diagnosis not present

## 2020-03-12 DIAGNOSIS — E119 Type 2 diabetes mellitus without complications: Secondary | ICD-10-CM | POA: Diagnosis not present

## 2020-03-12 DIAGNOSIS — N200 Calculus of kidney: Secondary | ICD-10-CM | POA: Diagnosis not present

## 2020-03-12 DIAGNOSIS — Z510 Encounter for antineoplastic radiation therapy: Secondary | ICD-10-CM | POA: Diagnosis not present

## 2020-03-12 DIAGNOSIS — Z79899 Other long term (current) drug therapy: Secondary | ICD-10-CM | POA: Diagnosis not present

## 2020-03-12 LAB — CBC WITH DIFFERENTIAL (CANCER CENTER ONLY)
Abs Immature Granulocytes: 0.03 10*3/uL (ref 0.00–0.07)
Basophils Absolute: 0 10*3/uL (ref 0.0–0.1)
Basophils Relative: 0 %
Eosinophils Absolute: 0.1 10*3/uL (ref 0.0–0.5)
Eosinophils Relative: 1 %
HCT: 39.3 % (ref 39.0–52.0)
Hemoglobin: 12.5 g/dL — ABNORMAL LOW (ref 13.0–17.0)
Immature Granulocytes: 1 %
Lymphocytes Relative: 7 %
Lymphs Abs: 0.4 10*3/uL — ABNORMAL LOW (ref 0.7–4.0)
MCH: 25.6 pg — ABNORMAL LOW (ref 26.0–34.0)
MCHC: 31.8 g/dL (ref 30.0–36.0)
MCV: 80.4 fL (ref 80.0–100.0)
Monocytes Absolute: 0.3 10*3/uL (ref 0.1–1.0)
Monocytes Relative: 7 %
Neutro Abs: 4.2 10*3/uL (ref 1.7–7.7)
Neutrophils Relative %: 84 %
Platelet Count: 236 10*3/uL (ref 150–400)
RBC: 4.89 MIL/uL (ref 4.22–5.81)
RDW: 15.2 % (ref 11.5–15.5)
WBC Count: 4.9 10*3/uL (ref 4.0–10.5)
nRBC: 0 % (ref 0.0–0.2)

## 2020-03-12 LAB — CMP (CANCER CENTER ONLY)
ALT: 14 U/L (ref 0–44)
AST: 12 U/L — ABNORMAL LOW (ref 15–41)
Albumin: 3.1 g/dL — ABNORMAL LOW (ref 3.5–5.0)
Alkaline Phosphatase: 70 U/L (ref 38–126)
Anion gap: 8 (ref 5–15)
BUN: 12 mg/dL (ref 8–23)
CO2: 28 mmol/L (ref 22–32)
Calcium: 9.1 mg/dL (ref 8.9–10.3)
Chloride: 102 mmol/L (ref 98–111)
Creatinine: 0.85 mg/dL (ref 0.61–1.24)
GFR, Estimated: >60 mL/min (ref >60)
Glucose, Bld: 142 mg/dL — ABNORMAL HIGH (ref 70–99)
Potassium: 4 mmol/L (ref 3.5–5.1)
Sodium: 138 mmol/L (ref 135–145)
Total Bilirubin: 0.9 mg/dL (ref 0.3–1.2)
Total Protein: 7.3 g/dL (ref 6.5–8.1)

## 2020-03-12 MED ORDER — FAMOTIDINE IN NACL 20-0.9 MG/50ML-% IV SOLN
20.0000 mg | Freq: Once | INTRAVENOUS | Status: AC
  Administered 2020-03-12: 20 mg via INTRAVENOUS

## 2020-03-12 MED ORDER — PALONOSETRON HCL INJECTION 0.25 MG/5ML
0.2500 mg | Freq: Once | INTRAVENOUS | Status: AC
  Administered 2020-03-12: 0.25 mg via INTRAVENOUS

## 2020-03-12 MED ORDER — SODIUM CHLORIDE 0.9 % IV SOLN
20.0000 mg | Freq: Once | INTRAVENOUS | Status: AC
  Administered 2020-03-12: 20 mg via INTRAVENOUS
  Filled 2020-03-12: qty 20

## 2020-03-12 MED ORDER — PALONOSETRON HCL INJECTION 0.25 MG/5ML
INTRAVENOUS | Status: AC
  Filled 2020-03-12: qty 5

## 2020-03-12 MED ORDER — SODIUM CHLORIDE 0.9 % IV SOLN
263.6000 mg | Freq: Once | INTRAVENOUS | Status: AC
  Administered 2020-03-12: 260 mg via INTRAVENOUS
  Filled 2020-03-12: qty 26

## 2020-03-12 MED ORDER — DIPHENHYDRAMINE HCL 50 MG/ML IJ SOLN
INTRAMUSCULAR | Status: AC
  Filled 2020-03-12: qty 1

## 2020-03-12 MED ORDER — SODIUM CHLORIDE 0.9% FLUSH
10.0000 mL | INTRAVENOUS | Status: DC | PRN
  Administered 2020-03-12: 10 mL
  Filled 2020-03-12: qty 10

## 2020-03-12 MED ORDER — FAMOTIDINE IN NACL 20-0.9 MG/50ML-% IV SOLN
INTRAVENOUS | Status: AC
  Filled 2020-03-12: qty 50

## 2020-03-12 MED ORDER — DIPHENHYDRAMINE HCL 50 MG/ML IJ SOLN
50.0000 mg | Freq: Once | INTRAMUSCULAR | Status: AC
  Administered 2020-03-12: 50 mg via INTRAVENOUS

## 2020-03-12 MED ORDER — HEPARIN SOD (PORK) LOCK FLUSH 100 UNIT/ML IV SOLN
500.0000 [IU] | Freq: Once | INTRAVENOUS | Status: AC | PRN
  Administered 2020-03-12: 500 [IU]
  Filled 2020-03-12: qty 5

## 2020-03-12 MED ORDER — SODIUM CHLORIDE 0.9 % IV SOLN
Freq: Once | INTRAVENOUS | Status: AC
  Filled 2020-03-12: qty 250

## 2020-03-12 MED ORDER — SODIUM CHLORIDE 0.9 % IV SOLN
45.0000 mg/m2 | Freq: Once | INTRAVENOUS | Status: AC
  Administered 2020-03-12: 108 mg via INTRAVENOUS
  Filled 2020-03-12: qty 18

## 2020-03-12 NOTE — Patient Instructions (Signed)
[facility] Discharge Instructions for Patients Receiving Chemotherapy  Today you received the following chemotherapy agents: Paclitaxel and Carboplatin  To help prevent nausea and vomiting after your treatment, we encourage you to take your nausea medication  as prescribed.    If you develop nausea and vomiting that is not controlled by your nausea medication, call the clinic.   BELOW ARE SYMPTOMS THAT SHOULD BE REPORTED IMMEDIATELY:  *FEVER GREATER THAN 100.5 F  *CHILLS WITH OR WITHOUT FEVER  NAUSEA AND VOMITING THAT IS NOT CONTROLLED WITH YOUR NAUSEA MEDICATION  *UNUSUAL SHORTNESS OF BREATH  *UNUSUAL BRUISING OR BLEEDING  TENDERNESS IN MOUTH AND THROAT WITH OR WITHOUT PRESENCE OF ULCERS  *URINARY PROBLEMS  *BOWEL PROBLEMS  UNUSUAL RASH Items with * indicate a potential emergency and should be followed up as soon as possible.  Feel free to call the clinic should you have any questions or concerns. The clinic phone number is [phone].  Please show the [place] at check-in to the Emergency Department and triage nurse.

## 2020-03-13 ENCOUNTER — Ambulatory Visit
Admission: RE | Admit: 2020-03-13 | Discharge: 2020-03-13 | Disposition: A | Discharge status: Home / Self Care | Payer: Medicare Other | Source: Ambulatory Visit | Attending: Radiation Oncology | Admitting: Radiation Oncology

## 2020-03-13 DIAGNOSIS — C3411 Malignant neoplasm of upper lobe, right bronchus or lung: Secondary | ICD-10-CM | POA: Diagnosis not present

## 2020-03-13 DIAGNOSIS — Z510 Encounter for antineoplastic radiation therapy: Secondary | ICD-10-CM | POA: Diagnosis not present

## 2020-03-13 DIAGNOSIS — R590 Localized enlarged lymph nodes: Secondary | ICD-10-CM | POA: Diagnosis not present

## 2020-03-13 DIAGNOSIS — Z87891 Personal history of nicotine dependence: Secondary | ICD-10-CM | POA: Diagnosis not present

## 2020-03-14 ENCOUNTER — Ambulatory Visit
Admission: RE | Admit: 2020-03-14 | Discharge: 2020-03-14 | Disposition: A | Discharge status: Home / Self Care | Payer: Medicare Other | Source: Ambulatory Visit | Attending: Radiation Oncology | Admitting: Radiation Oncology

## 2020-03-14 DIAGNOSIS — C3411 Malignant neoplasm of upper lobe, right bronchus or lung: Secondary | ICD-10-CM | POA: Diagnosis not present

## 2020-03-14 DIAGNOSIS — Z87891 Personal history of nicotine dependence: Secondary | ICD-10-CM | POA: Diagnosis not present

## 2020-03-14 DIAGNOSIS — R590 Localized enlarged lymph nodes: Secondary | ICD-10-CM | POA: Diagnosis not present

## 2020-03-14 DIAGNOSIS — Z510 Encounter for antineoplastic radiation therapy: Secondary | ICD-10-CM | POA: Diagnosis not present

## 2020-03-15 ENCOUNTER — Ambulatory Visit
Admission: RE | Admit: 2020-03-15 | Discharge: 2020-03-15 | Disposition: A | Discharge status: Home / Self Care | Payer: Medicare Other | Source: Ambulatory Visit | Attending: Radiation Oncology | Admitting: Radiation Oncology

## 2020-03-15 DIAGNOSIS — Z87891 Personal history of nicotine dependence: Secondary | ICD-10-CM | POA: Diagnosis not present

## 2020-03-15 DIAGNOSIS — Z510 Encounter for antineoplastic radiation therapy: Secondary | ICD-10-CM | POA: Diagnosis not present

## 2020-03-15 DIAGNOSIS — R590 Localized enlarged lymph nodes: Secondary | ICD-10-CM | POA: Diagnosis not present

## 2020-03-15 DIAGNOSIS — C3411 Malignant neoplasm of upper lobe, right bronchus or lung: Secondary | ICD-10-CM | POA: Diagnosis not present

## 2020-03-16 ENCOUNTER — Ambulatory Visit
Admission: RE | Admit: 2020-03-16 | Discharge: 2020-03-16 | Disposition: A | Discharge status: Home / Self Care | Payer: Medicare Other | Source: Ambulatory Visit | Attending: Radiation Oncology | Admitting: Radiation Oncology

## 2020-03-16 ENCOUNTER — Ambulatory Visit (HOSPITAL_COMMUNITY): Payer: Medicare Other

## 2020-03-16 DIAGNOSIS — Z510 Encounter for antineoplastic radiation therapy: Secondary | ICD-10-CM | POA: Diagnosis not present

## 2020-03-16 DIAGNOSIS — C3411 Malignant neoplasm of upper lobe, right bronchus or lung: Secondary | ICD-10-CM | POA: Diagnosis not present

## 2020-03-16 DIAGNOSIS — Z87891 Personal history of nicotine dependence: Secondary | ICD-10-CM | POA: Diagnosis not present

## 2020-03-16 DIAGNOSIS — R590 Localized enlarged lymph nodes: Secondary | ICD-10-CM | POA: Diagnosis not present

## 2020-03-18 ENCOUNTER — Other Ambulatory Visit: Payer: Self-pay

## 2020-03-18 ENCOUNTER — Ambulatory Visit
Admission: RE | Admit: 2020-03-18 | Discharge: 2020-03-18 | Disposition: A | Discharge status: Home / Self Care | Payer: Medicare Other | Source: Ambulatory Visit | Attending: Radiation Oncology | Admitting: Radiation Oncology

## 2020-03-18 DIAGNOSIS — Z510 Encounter for antineoplastic radiation therapy: Secondary | ICD-10-CM | POA: Diagnosis not present

## 2020-03-18 DIAGNOSIS — R590 Localized enlarged lymph nodes: Secondary | ICD-10-CM | POA: Diagnosis not present

## 2020-03-18 DIAGNOSIS — C3411 Malignant neoplasm of upper lobe, right bronchus or lung: Secondary | ICD-10-CM | POA: Diagnosis not present

## 2020-03-18 DIAGNOSIS — Z87891 Personal history of nicotine dependence: Secondary | ICD-10-CM | POA: Diagnosis not present

## 2020-03-19 ENCOUNTER — Other Ambulatory Visit: Payer: Self-pay

## 2020-03-19 ENCOUNTER — Ambulatory Visit
Admission: RE | Admit: 2020-03-19 | Discharge: 2020-03-19 | Disposition: A | Discharge status: Home / Self Care | Payer: Medicare Other | Source: Ambulatory Visit | Attending: Radiation Oncology | Admitting: Radiation Oncology

## 2020-03-19 ENCOUNTER — Encounter: Payer: Self-pay | Admitting: Internal Medicine

## 2020-03-19 ENCOUNTER — Inpatient Hospital Stay: Payer: Medicare Other | Admitting: Internal Medicine

## 2020-03-19 ENCOUNTER — Inpatient Hospital Stay: Payer: Medicare Other

## 2020-03-19 VITALS — BP 110/70 | HR 97 | Temp 97.4°F | Resp 18 | Ht 69.0 in | Wt 257.3 lb

## 2020-03-19 DIAGNOSIS — Z5111 Encounter for antineoplastic chemotherapy: Secondary | ICD-10-CM

## 2020-03-19 DIAGNOSIS — Z510 Encounter for antineoplastic radiation therapy: Secondary | ICD-10-CM | POA: Diagnosis not present

## 2020-03-19 DIAGNOSIS — N200 Calculus of kidney: Secondary | ICD-10-CM | POA: Diagnosis not present

## 2020-03-19 DIAGNOSIS — E119 Type 2 diabetes mellitus without complications: Secondary | ICD-10-CM | POA: Diagnosis not present

## 2020-03-19 DIAGNOSIS — C3411 Malignant neoplasm of upper lobe, right bronchus or lung: Secondary | ICD-10-CM

## 2020-03-19 DIAGNOSIS — Z79899 Other long term (current) drug therapy: Secondary | ICD-10-CM | POA: Diagnosis not present

## 2020-03-19 DIAGNOSIS — I700 Atherosclerosis of aorta: Secondary | ICD-10-CM | POA: Diagnosis not present

## 2020-03-19 DIAGNOSIS — K30 Functional dyspepsia: Secondary | ICD-10-CM | POA: Diagnosis not present

## 2020-03-19 DIAGNOSIS — R0609 Other forms of dyspnea: Secondary | ICD-10-CM | POA: Diagnosis not present

## 2020-03-19 DIAGNOSIS — Z87891 Personal history of nicotine dependence: Secondary | ICD-10-CM | POA: Diagnosis not present

## 2020-03-19 DIAGNOSIS — R590 Localized enlarged lymph nodes: Secondary | ICD-10-CM | POA: Diagnosis not present

## 2020-03-19 LAB — CBC WITH DIFFERENTIAL (CANCER CENTER ONLY)
Abs Immature Granulocytes: 0.04 10*3/uL (ref 0.00–0.07)
Basophils Absolute: 0 10*3/uL (ref 0.0–0.1)
Basophils Relative: 1 %
Eosinophils Absolute: 0 10*3/uL (ref 0.0–0.5)
Eosinophils Relative: 1 %
HCT: 37.1 % — ABNORMAL LOW (ref 39.0–52.0)
Hemoglobin: 11.9 g/dL — ABNORMAL LOW (ref 13.0–17.0)
Immature Granulocytes: 1 %
Lymphocytes Relative: 7 %
Lymphs Abs: 0.3 10*3/uL — ABNORMAL LOW (ref 0.7–4.0)
MCH: 26 pg (ref 26.0–34.0)
MCHC: 32.1 g/dL (ref 30.0–36.0)
MCV: 81 fL (ref 80.0–100.0)
Monocytes Absolute: 0.3 10*3/uL (ref 0.1–1.0)
Monocytes Relative: 7 %
Neutro Abs: 4.2 10*3/uL (ref 1.7–7.7)
Neutrophils Relative %: 83 %
Platelet Count: 217 10*3/uL (ref 150–400)
RBC: 4.58 MIL/uL (ref 4.22–5.81)
RDW: 15.9 % — ABNORMAL HIGH (ref 11.5–15.5)
WBC Count: 5 10*3/uL (ref 4.0–10.5)
nRBC: 0 % (ref 0.0–0.2)

## 2020-03-19 LAB — CMP (CANCER CENTER ONLY)
ALT: 12 U/L (ref 0–44)
AST: 12 U/L — ABNORMAL LOW (ref 15–41)
Albumin: 3 g/dL — ABNORMAL LOW (ref 3.5–5.0)
Alkaline Phosphatase: 62 U/L (ref 38–126)
Anion gap: 6 (ref 5–15)
BUN: 11 mg/dL (ref 8–23)
CO2: 28 mmol/L (ref 22–32)
Calcium: 8.9 mg/dL (ref 8.9–10.3)
Chloride: 104 mmol/L (ref 98–111)
Creatinine: 0.83 mg/dL (ref 0.61–1.24)
GFR, Estimated: >60 mL/min (ref >60)
Glucose, Bld: 114 mg/dL — ABNORMAL HIGH (ref 70–99)
Potassium: 4.1 mmol/L (ref 3.5–5.1)
Sodium: 138 mmol/L (ref 135–145)
Total Bilirubin: 0.5 mg/dL (ref 0.3–1.2)
Total Protein: 6.7 g/dL (ref 6.5–8.1)

## 2020-03-19 MED ORDER — PALONOSETRON HCL INJECTION 0.25 MG/5ML
0.2500 mg | Freq: Once | INTRAVENOUS | Status: AC
  Administered 2020-03-19: 0.25 mg via INTRAVENOUS

## 2020-03-19 MED ORDER — FAMOTIDINE IN NACL 20-0.9 MG/50ML-% IV SOLN
INTRAVENOUS | Status: AC
  Filled 2020-03-19: qty 50

## 2020-03-19 MED ORDER — PALONOSETRON HCL INJECTION 0.25 MG/5ML
INTRAVENOUS | Status: AC
  Filled 2020-03-19: qty 5

## 2020-03-19 MED ORDER — DIPHENHYDRAMINE HCL 50 MG/ML IJ SOLN
INTRAMUSCULAR | Status: AC
  Filled 2020-03-19: qty 1

## 2020-03-19 MED ORDER — SODIUM CHLORIDE 0.9% FLUSH
10.0000 mL | INTRAVENOUS | Status: DC | PRN
  Administered 2020-03-19: 10 mL
  Filled 2020-03-19: qty 10

## 2020-03-19 MED ORDER — SODIUM CHLORIDE 0.9 % IV SOLN
45.0000 mg/m2 | Freq: Once | INTRAVENOUS | Status: AC
  Administered 2020-03-19: 108 mg via INTRAVENOUS
  Filled 2020-03-19: qty 18

## 2020-03-19 MED ORDER — SODIUM CHLORIDE 0.9 % IV SOLN
263.6000 mg | Freq: Once | INTRAVENOUS | Status: AC
  Administered 2020-03-19: 260 mg via INTRAVENOUS
  Filled 2020-03-19: qty 26

## 2020-03-19 MED ORDER — SODIUM CHLORIDE 0.9 % IV SOLN
20.0000 mg | Freq: Once | INTRAVENOUS | Status: AC
  Administered 2020-03-19: 20 mg via INTRAVENOUS
  Filled 2020-03-19: qty 20

## 2020-03-19 MED ORDER — DIPHENHYDRAMINE HCL 50 MG/ML IJ SOLN
50.0000 mg | Freq: Once | INTRAMUSCULAR | Status: AC
  Administered 2020-03-19: 50 mg via INTRAVENOUS

## 2020-03-19 MED ORDER — SODIUM CHLORIDE 0.9 % IV SOLN
Freq: Once | INTRAVENOUS | Status: AC
  Filled 2020-03-19: qty 250

## 2020-03-19 MED ORDER — FAMOTIDINE IN NACL 20-0.9 MG/50ML-% IV SOLN
20.0000 mg | Freq: Once | INTRAVENOUS | Status: AC
  Administered 2020-03-19: 20 mg via INTRAVENOUS

## 2020-03-19 MED ORDER — HEPARIN SOD (PORK) LOCK FLUSH 100 UNIT/ML IV SOLN
500.0000 [IU] | Freq: Once | INTRAVENOUS | Status: AC | PRN
  Administered 2020-03-19: 500 [IU]
  Filled 2020-03-19: qty 5

## 2020-03-19 NOTE — Progress Notes (Signed)
[facility] [phone]   Fax:([phone]  OFFICE PROGRESS NOTE  [name], MD [place] [address]  DIAGNOSIS: Stage IIIa (T4, N0, M0) non-small cell lung cancer, squamous cell carcinoma presented with large right upper lobe lung mass diagnosed in October 2021.  PRIOR THERAPY: None  CURRENT THERAPY: Concurrent chemoradiation with weekly carboplatin for AUC of 2 and paclitaxel 45 mg/M2. Status post 3 cycles. First dose on 02/27/20  INTERVAL HISTORY: Billy Hall 74 y.o. male returns to the clinic today for follow-up visit.  The patient is feeling fine today with no concerning complaints except for mild soreness in his throat secondary to radiotherapy.  He denied having any current chest pain, shortness of breath, cough or hemoptysis.  He denied having any fever or chills.  He has no nausea, vomiting, diarrhea or constipation.  He denied having any headache or visual changes.  He is tolerating his course of concurrent chemoradiation fairly well.  The patient is here today for evaluation before starting cycle #4.  MEDICAL HISTORY: Past Medical History:  Diagnosis Date   Arthritis    Depressive disorder    Diabetes mellitus without complication (HCC)    Fatigue    Headache(784.0)    Hyperlipidemia    Hypertension    Hypothyroidism    Snoring     ALLERGIES:  is allergic to ace inhibitors.  MEDICATIONS:  Current Outpatient Medications  Medication Sig Dispense Refill   acetaminophen (TYLENOL 8 HOUR) 650 MG CR tablet Take 650 mg by mouth every 8 (eight) hours as needed for pain.     aspirin EC 81 MG tablet Take 81 mg by mouth daily.     cholecalciferol (VITAMIN D3) 25 MCG (1000 UNIT) tablet Take 1,000 Units by mouth daily.     levothyroxine (SYNTHROID) 88 MCG tablet Take 88 mcg by mouth daily before breakfast.      losartan (COZAAR) 50 MG tablet Take 50 mg by mouth daily.      metFORMIN (GLUCOPHAGE)  500 MG tablet Take 500 mg by mouth 2 (two) times daily with a meal.      ONETOUCH ULTRA test strip 1 each 2 (two) times daily.     prochlorperazine (COMPAZINE) 10 MG tablet Take 1 tablet (10 mg total) by mouth every 6 (six) hours as needed for nausea or vomiting. 30 tablet 0   sildenafil (REVATIO) 20 MG tablet Take 20 mg by mouth daily as needed (ED).      simvastatin (ZOCOR) 10 MG tablet Take 10 mg by mouth every evening.      tamsulosin (FLOMAX) 0.4 MG CAPS capsule Take 0.4 mg by mouth daily.     No current facility-administered medications for this visit.    SURGICAL HISTORY:  Past Surgical History:  Procedure Laterality Date   COLONOSCOPY N/A 03/08/2013   Procedure: COLONOSCOPY;  Surgeon: Jamesetta So, MD;  Location: [facility];  Service: Gastroenterology;  Laterality: N/A;   IR IMAGING GUIDED PORT INSERTION  03/08/2020   IR US GUIDE BX ASP/DRAIN  03/08/2020   KNEE ARTHROSCOPY Right    2006 By Dr. Luna Glasgow at [facility].    THYROID SURGERY     VIDEO BRONCHOSCOPY WITH ENDOBRONCHIAL ULTRASOUND N/A 01/30/2020   Procedure: VIDEO BRONCHOSCOPY WITH ENDOBRONCHIAL ULTRASOUND;  Surgeon: Melrose Nakayama, MD;  Location: [facility] OR;  Service: Thoracic;  Laterality: N/A;    REVIEW OF SYSTEMS:  A comprehensive review of systems was negative except for: Ears,  nose, mouth, throat, and face: positive for sore throat   PHYSICAL EXAMINATION: General appearance: alert, cooperative and no distress Head: Normocephalic, without obvious abnormality, atraumatic Neck: no adenopathy, no JVD, supple, symmetrical, trachea midline and thyroid not enlarged, symmetric, no tenderness/mass/nodules Lymph nodes: Cervical, supraclavicular, and axillary nodes normal. Resp: clear to auscultation bilaterally Back: symmetric, no curvature. ROM normal. No CVA tenderness. Cardio: regular rate and rhythm, S1, S2 normal, no murmur, click, rub or gallop GI: soft, non-tender; bowel sounds normal; no masses,  no  organomegaly Extremities: extremities normal, atraumatic, no cyanosis or edema  ECOG PERFORMANCE STATUS: 1 - Symptomatic but completely ambulatory  Blood pressure 110/70, pulse 97, temperature (!) 97.4 F (36.3 C), temperature source Tympanic, resp. rate 18, height $RemoveBe'5\' 9"'OHbkcLDgg$  (1.753 m), weight 257 lb 4.8 oz (116.7 kg), SpO2 97 %.  LABORATORY DATA: Lab Results  Component Value Date   WBC 5.0 03/19/2020   HGB 11.9 (L) 03/19/2020   HCT 37.1 (L) 03/19/2020   MCV 81.0 03/19/2020   PLT 217 03/19/2020      Chemistry      Component Value Date/Time   NA 138 03/12/2020 1220   K 4.0 03/12/2020 1220   CL 102 03/12/2020 1220   CO2 28 03/12/2020 1220   BUN 12 03/12/2020 1220   CREATININE 0.85 03/12/2020 1220      Component Value Date/Time   CALCIUM 9.1 03/12/2020 1220   ALKPHOS 70 03/12/2020 1220   AST 12 (L) 03/12/2020 1220   ALT 14 03/12/2020 1220   BILITOT 0.9 03/12/2020 1220       RADIOGRAPHIC STUDIES: CT SOFT TISSUE NECK W CONTRAST  Result Date: 03/06/2020 CLINICAL DATA:  Malignant neoplasm right upper lobe. Left parotid mass. EXAM: CT NECK WITH CONTRAST TECHNIQUE: Multidetector CT imaging of the neck was performed using the standard protocol following the bolus administration of intravenous contrast. CONTRAST:  62mL OMNIPAQUE IOHEXOL 300 MG/ML  SOLN COMPARISON:  PET-CT 02/06/2020 FINDINGS: Pharynx and larynx: Normal. No mass or swelling. Salivary glands: Enhancing soft tissue nodule along the posterior margin of the left parotid gland corresponds to the PET finding. This nodule measures 6 x 10 mm this is hypermetabolic on PET and shows homogeneous enhancement on CT. This could be a parotid tumor such as Warthin's tumor or adjacent lymph node. Right parotid normal.  Submandibular normal bilaterally. Thyroid: Parotidectomy. Small amount residual parotid tissue bilaterally without mass lesion. Lymph nodes: Possible lymph node versus parotid tumor on the left as described above. Otherwise no  enlarged lymph nodes. Vascular: Normal vascular enhancement. Limited intracranial: Negative Visualized orbits: Negative Mastoids and visualized paranasal sinuses: Mild mucosal edema paranasal sinuses. Prior sinus surgery right maxillary sinus. Skeleton: No acute skeletal abnormality.  Mild cervical spondylosis. Upper chest: Large right upper lobe mass lesion as noted on prior PET-CT. Left upper lobe clear. Other: None IMPRESSION: 6 x 10 mm enhancing nodule along the posterior margin of the left parotid gland. This could represent a Warthin's tumor. Warthin's tumors can be hypermetabolic on PET as is this lesion. Also possible is a metastatic lymph node. Tissue sampling recommended. Right upper lobe mass compatible with carcinoma. Electronically Signed   By: Franchot Gallo M.D.   On: 03/06/2020 13:26   IR US Guide Bx Asp/Drain  Result Date: 03/08/2020 INDICATION: Recent diagnosis of lung cancer. In need of durable intravenous access for chemotherapy administration Patient also with indeterminate hypermetabolic nodule within the left parotid gland noted on PET-CT performed 02/06/2020. Request made for ultrasound-guided biopsy for tissue diagnostic  purposes. EXAM: 1. IMPLANTED PORT A CATH PLACEMENT WITH ULTRASOUND AND FLUOROSCOPIC GUIDANCE 2. ULTRASOUND-GUIDED BIOPSY OF INDETERMINATE LEFT-SIDED PAROTID NODULE COMPARISON:  PET-CT-02/06/2020; soft tissue neck CT-03/06/2020 MEDICATIONS: Ancef 2 gm IV; The antibiotic was administered within an appropriate time interval prior to skin puncture. ANESTHESIA/SEDATION: Moderate (conscious) sedation was employed during both procedures. A total of Versed 3 mg and Fentanyl 100 mcg was administered intravenously for both procedures. Moderate Sedation Time: 45 minutes. The patient's level of consciousness and vital signs were monitored continuously by radiology nursing throughout the procedure under my direct supervision. CONTRAST:  None FLUOROSCOPY TIME:  42 seconds (23 mGy)  COMPLICATIONS: None immediate. PROCEDURE: The procedure, risks, benefits, and alternatives were explained to the patient regarding both the image guided left-sided parotid nodule biopsy as well as the [facility] a catheter placement. Questions regarding the procedures was encouraged and answered. The patient understands and consents to both procedures. Attention was first paid towards ultrasound-guided biopsy of the indeterminate left-sided parotid nodule. Preprocedural imaging demonstrates an approximately 1.0 x 0.6 cm hypoechoic nodule within the posterior aspect of the left parotid gland correlating with the hypermetabolic nodule seen on preceding PET-CT image 225, series 605. The skin overlying the operative site with prepped and draped in usual sterile fashion. Under direct ultrasound guidance, 5 core needle biopsies were obtained of the indeterminate left parotid gland nodule. Multiple ultrasound images were saved procedural documentation purposes. Samples were placed in saline and submitted to the laboratory for analysis. Postprocedural imaging was negative for presence of a hematoma. Dressing was applied Attention was now paid towards placement Port a catheter. The right neck and chest were prepped with chlorhexidine in a sterile fashion, and a sterile drape was applied covering the operative field. Maximum barrier sterile technique with sterile gowns and gloves were used for the procedure. A timeout was performed prior to the initiation of the procedure. Local anesthesia was provided with 1% lidocaine with epinephrine. After creating a small venotomy incision, a micropuncture kit was utilized to access the internal jugular vein. Real-time ultrasound guidance was utilized for vascular access including the acquisition of a permanent ultrasound image documenting patency of the accessed vessel. The microwire was utilized to measure appropriate catheter length. A subcutaneous port pocket was then created along the  upper chest wall utilizing a combination of sharp and blunt dissection. The pocket was irrigated with sterile saline. A single lumen "ISP" sized power injectable port was chosen for placement. The 8 Fr catheter was tunneled from the port pocket site to the venotomy incision. The port was placed in the pocket. The external catheter was trimmed to appropriate length. At the venotomy, an 8 Fr peel-away sheath was placed over a guidewire under fluoroscopic guidance. The catheter was then placed through the sheath and the sheath was removed. Final catheter positioning was confirmed and documented with a fluoroscopic spot radiograph. The port was accessed with a Huber needle, aspirated and flushed with heparinized saline. The venotomy site was closed with an interrupted 4-0 Vicryl suture. The port pocket incision was closed with interrupted 2-0 Vicryl suture. The skin was opposed with a running subcuticular 4-0 Vicryl suture. Dermabond and Steri-strips were applied to both incisions. Dressings were applied. The patient tolerated the procedure well without immediate post procedural complication. FINDINGS: Sonographic evaluation confirms appropriate positioning of the core needle biopsy device within the approximately 1 cm hypoechoic nodule within the posterior aspect of the left parotid gland. After catheter placement, the tip lies within the superior cavoatrial junction. The catheter  aspirates and flushes normally and is ready for immediate use. IMPRESSION: 1. Technically successful ultrasound-guided biopsy of indeterminate hypermetabolic left parotid gland. 2. Successful placement of a right internal jugular approach power injectable Port-A-Cath. The catheter is ready for immediate use. Electronically Signed   By: Sandi Mariscal M.D.   On: 03/08/2020 16:14   IR IMAGING GUIDED PORT INSERTION  Result Date: 03/08/2020 INDICATION: Recent diagnosis of lung cancer. In need of durable intravenous access for chemotherapy  administration Patient also with indeterminate hypermetabolic nodule within the left parotid gland noted on PET-CT performed 02/06/2020. Request made for ultrasound-guided biopsy for tissue diagnostic purposes. EXAM: 1. IMPLANTED PORT A CATH PLACEMENT WITH ULTRASOUND AND FLUOROSCOPIC GUIDANCE 2. ULTRASOUND-GUIDED BIOPSY OF INDETERMINATE LEFT-SIDED PAROTID NODULE COMPARISON:  PET-CT-02/06/2020; soft tissue neck CT-03/06/2020 MEDICATIONS: Ancef 2 gm IV; The antibiotic was administered within an appropriate time interval prior to skin puncture. ANESTHESIA/SEDATION: Moderate (conscious) sedation was employed during both procedures. A total of Versed 3 mg and Fentanyl 100 mcg was administered intravenously for both procedures. Moderate Sedation Time: 45 minutes. The patient's level of consciousness and vital signs were monitored continuously by radiology nursing throughout the procedure under my direct supervision. CONTRAST:  None FLUOROSCOPY TIME:  42 seconds (23 mGy) COMPLICATIONS: None immediate. PROCEDURE: The procedure, risks, benefits, and alternatives were explained to the patient regarding both the image guided left-sided parotid nodule biopsy as well as the [facility] a catheter placement. Questions regarding the procedures was encouraged and answered. The patient understands and consents to both procedures. Attention was first paid towards ultrasound-guided biopsy of the indeterminate left-sided parotid nodule. Preprocedural imaging demonstrates an approximately 1.0 x 0.6 cm hypoechoic nodule within the posterior aspect of the left parotid gland correlating with the hypermetabolic nodule seen on preceding PET-CT image 225, series 605. The skin overlying the operative site with prepped and draped in usual sterile fashion. Under direct ultrasound guidance, 5 core needle biopsies were obtained of the indeterminate left parotid gland nodule. Multiple ultrasound images were saved procedural documentation purposes. Samples  were placed in saline and submitted to the laboratory for analysis. Postprocedural imaging was negative for presence of a hematoma. Dressing was applied Attention was now paid towards placement Port a catheter. The right neck and chest were prepped with chlorhexidine in a sterile fashion, and a sterile drape was applied covering the operative field. Maximum barrier sterile technique with sterile gowns and gloves were used for the procedure. A timeout was performed prior to the initiation of the procedure. Local anesthesia was provided with 1% lidocaine with epinephrine. After creating a small venotomy incision, a micropuncture kit was utilized to access the internal jugular vein. Real-time ultrasound guidance was utilized for vascular access including the acquisition of a permanent ultrasound image documenting patency of the accessed vessel. The microwire was utilized to measure appropriate catheter length. A subcutaneous port pocket was then created along the upper chest wall utilizing a combination of sharp and blunt dissection. The pocket was irrigated with sterile saline. A single lumen "ISP" sized power injectable port was chosen for placement. The 8 Fr catheter was tunneled from the port pocket site to the venotomy incision. The port was placed in the pocket. The external catheter was trimmed to appropriate length. At the venotomy, an 8 Fr peel-away sheath was placed over a guidewire under fluoroscopic guidance. The catheter was then placed through the sheath and the sheath was removed. Final catheter positioning was confirmed and documented with a fluoroscopic spot radiograph. The port was accessed with  a Huber needle, aspirated and flushed with heparinized saline. The venotomy site was closed with an interrupted 4-0 Vicryl suture. The port pocket incision was closed with interrupted 2-0 Vicryl suture. The skin was opposed with a running subcuticular 4-0 Vicryl suture. Dermabond and Steri-strips were applied  to both incisions. Dressings were applied. The patient tolerated the procedure well without immediate post procedural complication. FINDINGS: Sonographic evaluation confirms appropriate positioning of the core needle biopsy device within the approximately 1 cm hypoechoic nodule within the posterior aspect of the left parotid gland. After catheter placement, the tip lies within the superior cavoatrial junction. The catheter aspirates and flushes normally and is ready for immediate use. IMPRESSION: 1. Technically successful ultrasound-guided biopsy of indeterminate hypermetabolic left parotid gland. 2. Successful placement of a right internal jugular approach power injectable Port-A-Cath. The catheter is ready for immediate use. Electronically Signed   By: Sandi Mariscal M.D.   On: 03/08/2020 16:14    ASSESSMENT AND PLAN: This is a very pleasant 74 years old African-American male recently diagnosed with a stage IIIA non-small cell lung cancer, squamous cell carcinoma presented with large right upper lobe lung mass in October 2021. The patient is currently undergoing a course of concurrent chemoradiation with weekly carboplatin and paclitaxel status post 3 cycles.  The patient has been tolerating this treatment well with no concerning adverse effect except for mild sore throat from the radiation. I recommended for him to proceed with cycle #4 today as planned. He will come back for follow-up visit in 2 weeks for evaluation before starting cycle #6. He was advised to call immediately if he has any concerning symptoms in the interval. The patient voices understanding of current disease status and treatment options and is in agreement with the current care plan.  All questions were answered. The patient knows to call the clinic with any problems, questions or concerns. We can certainly see the patient much sooner if necessary.   Disclaimer: This note was dictated with voice recognition software. Similar sounding  words can inadvertently be transcribed and may not be corrected upon review.

## 2020-03-19 NOTE — Patient Instructions (Signed)
[facility] Discharge Instructions for Patients Receiving Chemotherapy  Today you received the following chemotherapy agents: Paclitaxel and Carboplatin  To help prevent nausea and vomiting after your treatment, we encourage you to take your nausea medication  as prescribed.    If you develop nausea and vomiting that is not controlled by your nausea medication, call the clinic.   BELOW ARE SYMPTOMS THAT SHOULD BE REPORTED IMMEDIATELY:  *FEVER GREATER THAN 100.5 F  *CHILLS WITH OR WITHOUT FEVER  NAUSEA AND VOMITING THAT IS NOT CONTROLLED WITH YOUR NAUSEA MEDICATION  *UNUSUAL SHORTNESS OF BREATH  *UNUSUAL BRUISING OR BLEEDING  TENDERNESS IN MOUTH AND THROAT WITH OR WITHOUT PRESENCE OF ULCERS  *URINARY PROBLEMS  *BOWEL PROBLEMS  UNUSUAL RASH Items with * indicate a potential emergency and should be followed up as soon as possible.  Feel free to call the clinic should you have any questions or concerns. The clinic phone number is [phone].  Please show the [place] at check-in to the Emergency Department and triage nurse.

## 2020-03-20 ENCOUNTER — Ambulatory Visit
Admission: RE | Admit: 2020-03-20 | Discharge: 2020-03-20 | Disposition: A | Discharge status: Home / Self Care | Payer: Medicare Other | Source: Ambulatory Visit | Attending: Radiation Oncology | Admitting: Radiation Oncology

## 2020-03-20 DIAGNOSIS — R590 Localized enlarged lymph nodes: Secondary | ICD-10-CM | POA: Diagnosis not present

## 2020-03-20 DIAGNOSIS — Z510 Encounter for antineoplastic radiation therapy: Secondary | ICD-10-CM | POA: Diagnosis not present

## 2020-03-20 DIAGNOSIS — Z87891 Personal history of nicotine dependence: Secondary | ICD-10-CM | POA: Diagnosis not present

## 2020-03-20 DIAGNOSIS — C3411 Malignant neoplasm of upper lobe, right bronchus or lung: Secondary | ICD-10-CM | POA: Diagnosis not present

## 2020-03-21 ENCOUNTER — Ambulatory Visit
Admission: RE | Admit: 2020-03-21 | Discharge: 2020-03-21 | Disposition: A | Discharge status: Home / Self Care | Payer: Medicare Other | Source: Ambulatory Visit | Attending: Radiation Oncology | Admitting: Radiation Oncology

## 2020-03-21 DIAGNOSIS — Z510 Encounter for antineoplastic radiation therapy: Secondary | ICD-10-CM | POA: Diagnosis not present

## 2020-03-21 DIAGNOSIS — C3411 Malignant neoplasm of upper lobe, right bronchus or lung: Secondary | ICD-10-CM | POA: Diagnosis not present

## 2020-03-21 DIAGNOSIS — R590 Localized enlarged lymph nodes: Secondary | ICD-10-CM | POA: Diagnosis not present

## 2020-03-21 DIAGNOSIS — Z87891 Personal history of nicotine dependence: Secondary | ICD-10-CM | POA: Diagnosis not present

## 2020-03-26 ENCOUNTER — Other Ambulatory Visit: Payer: Self-pay

## 2020-03-26 ENCOUNTER — Other Ambulatory Visit: Payer: Self-pay | Admitting: Radiation Oncology

## 2020-03-26 ENCOUNTER — Inpatient Hospital Stay: Payer: Medicare Other

## 2020-03-26 ENCOUNTER — Ambulatory Visit
Admission: RE | Admit: 2020-03-26 | Discharge: 2020-03-26 | Disposition: A | Discharge status: Home / Self Care | Payer: Medicare Other | Source: Ambulatory Visit | Attending: Radiation Oncology | Admitting: Radiation Oncology

## 2020-03-26 VITALS — BP 114/72 | HR 79 | Temp 98.3°F | Resp 18 | Wt 256.2 lb

## 2020-03-26 DIAGNOSIS — K30 Functional dyspepsia: Secondary | ICD-10-CM | POA: Diagnosis not present

## 2020-03-26 DIAGNOSIS — N200 Calculus of kidney: Secondary | ICD-10-CM | POA: Diagnosis not present

## 2020-03-26 DIAGNOSIS — Z79899 Other long term (current) drug therapy: Secondary | ICD-10-CM | POA: Diagnosis not present

## 2020-03-26 DIAGNOSIS — Z95828 Presence of other vascular implants and grafts: Secondary | ICD-10-CM

## 2020-03-26 DIAGNOSIS — Z87891 Personal history of nicotine dependence: Secondary | ICD-10-CM | POA: Diagnosis not present

## 2020-03-26 DIAGNOSIS — E119 Type 2 diabetes mellitus without complications: Secondary | ICD-10-CM | POA: Diagnosis not present

## 2020-03-26 DIAGNOSIS — R590 Localized enlarged lymph nodes: Secondary | ICD-10-CM | POA: Diagnosis not present

## 2020-03-26 DIAGNOSIS — Z5111 Encounter for antineoplastic chemotherapy: Secondary | ICD-10-CM | POA: Diagnosis not present

## 2020-03-26 DIAGNOSIS — C3411 Malignant neoplasm of upper lobe, right bronchus or lung: Secondary | ICD-10-CM | POA: Diagnosis not present

## 2020-03-26 DIAGNOSIS — Z510 Encounter for antineoplastic radiation therapy: Secondary | ICD-10-CM | POA: Diagnosis not present

## 2020-03-26 DIAGNOSIS — I700 Atherosclerosis of aorta: Secondary | ICD-10-CM | POA: Diagnosis not present

## 2020-03-26 DIAGNOSIS — R0609 Other forms of dyspnea: Secondary | ICD-10-CM | POA: Diagnosis not present

## 2020-03-26 LAB — CBC WITH DIFFERENTIAL (CANCER CENTER ONLY)
Abs Immature Granulocytes: 0.02 10*3/uL (ref 0.00–0.07)
Basophils Absolute: 0 10*3/uL (ref 0.0–0.1)
Basophils Relative: 1 %
Eosinophils Absolute: 0 10*3/uL (ref 0.0–0.5)
Eosinophils Relative: 0 %
HCT: 34.9 % — ABNORMAL LOW (ref 39.0–52.0)
Hemoglobin: 11.4 g/dL — ABNORMAL LOW (ref 13.0–17.0)
Immature Granulocytes: 1 %
Lymphocytes Relative: 8 %
Lymphs Abs: 0.3 10*3/uL — ABNORMAL LOW (ref 0.7–4.0)
MCH: 26.6 pg (ref 26.0–34.0)
MCHC: 32.7 g/dL (ref 30.0–36.0)
MCV: 81.5 fL (ref 80.0–100.0)
Monocytes Absolute: 0.3 10*3/uL (ref 0.1–1.0)
Monocytes Relative: 7 %
Neutro Abs: 2.8 10*3/uL (ref 1.7–7.7)
Neutrophils Relative %: 83 %
Platelet Count: 142 10*3/uL — ABNORMAL LOW (ref 150–400)
RBC: 4.28 MIL/uL (ref 4.22–5.81)
RDW: 16.5 % — ABNORMAL HIGH (ref 11.5–15.5)
WBC Count: 3.4 10*3/uL — ABNORMAL LOW (ref 4.0–10.5)
nRBC: 0 % (ref 0.0–0.2)

## 2020-03-26 LAB — CMP (CANCER CENTER ONLY)
ALT: 13 U/L (ref 0–44)
AST: 12 U/L — ABNORMAL LOW (ref 15–41)
Albumin: 3 g/dL — ABNORMAL LOW (ref 3.5–5.0)
Alkaline Phosphatase: 62 U/L (ref 38–126)
Anion gap: 10 (ref 5–15)
BUN: 10 mg/dL (ref 8–23)
CO2: 24 mmol/L (ref 22–32)
Calcium: 9 mg/dL (ref 8.9–10.3)
Chloride: 106 mmol/L (ref 98–111)
Creatinine: 0.78 mg/dL (ref 0.61–1.24)
GFR, Estimated: >60 mL/min (ref >60)
Glucose, Bld: 127 mg/dL — ABNORMAL HIGH (ref 70–99)
Potassium: 4.1 mmol/L (ref 3.5–5.1)
Sodium: 140 mmol/L (ref 135–145)
Total Bilirubin: 0.5 mg/dL (ref 0.3–1.2)
Total Protein: 6.4 g/dL — ABNORMAL LOW (ref 6.5–8.1)

## 2020-03-26 MED ORDER — PALONOSETRON HCL INJECTION 0.25 MG/5ML
INTRAVENOUS | Status: AC
  Filled 2020-03-26: qty 5

## 2020-03-26 MED ORDER — PALONOSETRON HCL INJECTION 0.25 MG/5ML
0.2500 mg | Freq: Once | INTRAVENOUS | Status: AC
  Administered 2020-03-26: 0.25 mg via INTRAVENOUS

## 2020-03-26 MED ORDER — SODIUM CHLORIDE 0.9 % IV SOLN
20.0000 mg | Freq: Once | INTRAVENOUS | Status: AC
  Administered 2020-03-26: 20 mg via INTRAVENOUS
  Filled 2020-03-26: qty 20

## 2020-03-26 MED ORDER — DIPHENHYDRAMINE HCL 50 MG/ML IJ SOLN
50.0000 mg | Freq: Once | INTRAMUSCULAR | Status: AC
  Administered 2020-03-26: 50 mg via INTRAVENOUS

## 2020-03-26 MED ORDER — FAMOTIDINE IN NACL 20-0.9 MG/50ML-% IV SOLN
INTRAVENOUS | Status: AC
  Filled 2020-03-26: qty 50

## 2020-03-26 MED ORDER — FAMOTIDINE IN NACL 20-0.9 MG/50ML-% IV SOLN
20.0000 mg | Freq: Once | INTRAVENOUS | Status: AC
  Administered 2020-03-26: 20 mg via INTRAVENOUS

## 2020-03-26 MED ORDER — HEPARIN SOD (PORK) LOCK FLUSH 100 UNIT/ML IV SOLN
500.0000 [IU] | Freq: Once | INTRAVENOUS | Status: AC | PRN
  Administered 2020-03-26: 500 [IU]
  Filled 2020-03-26: qty 5

## 2020-03-26 MED ORDER — SODIUM CHLORIDE 0.9 % IV SOLN
45.0000 mg/m2 | Freq: Once | INTRAVENOUS | Status: AC
  Administered 2020-03-26: 108 mg via INTRAVENOUS
  Filled 2020-03-26: qty 18

## 2020-03-26 MED ORDER — SODIUM CHLORIDE 0.9 % IV SOLN
Freq: Once | INTRAVENOUS | Status: AC
  Filled 2020-03-26: qty 250

## 2020-03-26 MED ORDER — SODIUM CHLORIDE 0.9 % IV SOLN
263.6000 mg | Freq: Once | INTRAVENOUS | Status: AC
  Administered 2020-03-26: 260 mg via INTRAVENOUS
  Filled 2020-03-26: qty 26

## 2020-03-26 MED ORDER — SODIUM CHLORIDE 0.9% FLUSH
10.0000 mL | INTRAVENOUS | Status: AC | PRN
  Administered 2020-03-26: 10 mL
  Filled 2020-03-26: qty 10

## 2020-03-26 MED ORDER — SODIUM CHLORIDE 0.9% FLUSH
10.0000 mL | INTRAVENOUS | Status: DC | PRN
  Administered 2020-03-26: 10 mL
  Filled 2020-03-26: qty 10

## 2020-03-26 MED ORDER — SUCRALFATE 1 G PO TABS: 1.0000 g | ORAL_TABLET | Freq: Three times a day (TID) | ORAL | 2 refills | Status: DC

## 2020-03-26 MED ORDER — DIPHENHYDRAMINE HCL 50 MG/ML IJ SOLN
INTRAMUSCULAR | Status: AC
  Filled 2020-03-26: qty 1

## 2020-03-26 NOTE — Patient Instructions (Signed)

## 2020-03-26 NOTE — Patient Instructions (Signed)
[facility] Discharge Instructions for Patients Receiving Chemotherapy  Today you received the following chemotherapy agents: Paclitaxel and Carboplatin  To help prevent nausea and vomiting after your treatment, we encourage you to take your nausea medication  as prescribed.    If you develop nausea and vomiting that is not controlled by your nausea medication, call the clinic.   BELOW ARE SYMPTOMS THAT SHOULD BE REPORTED IMMEDIATELY:  *FEVER GREATER THAN 100.5 F  *CHILLS WITH OR WITHOUT FEVER  NAUSEA AND VOMITING THAT IS NOT CONTROLLED WITH YOUR NAUSEA MEDICATION  *UNUSUAL SHORTNESS OF BREATH  *UNUSUAL BRUISING OR BLEEDING  TENDERNESS IN MOUTH AND THROAT WITH OR WITHOUT PRESENCE OF ULCERS  *URINARY PROBLEMS  *BOWEL PROBLEMS  UNUSUAL RASH Items with * indicate a potential emergency and should be followed up as soon as possible.  Feel free to call the clinic should you have any questions or concerns. The clinic phone number is [phone].  Please show the [place] at check-in to the Emergency Department and triage nurse.

## 2020-03-27 ENCOUNTER — Ambulatory Visit
Admission: RE | Admit: 2020-03-27 | Discharge: 2020-03-27 | Disposition: A | Discharge status: Home / Self Care | Payer: Medicare Other | Source: Ambulatory Visit | Attending: Radiation Oncology | Admitting: Radiation Oncology

## 2020-03-27 DIAGNOSIS — R590 Localized enlarged lymph nodes: Secondary | ICD-10-CM | POA: Diagnosis not present

## 2020-03-27 DIAGNOSIS — E1165 Type 2 diabetes mellitus with hyperglycemia: Secondary | ICD-10-CM | POA: Diagnosis not present

## 2020-03-27 DIAGNOSIS — C3411 Malignant neoplasm of upper lobe, right bronchus or lung: Secondary | ICD-10-CM | POA: Diagnosis not present

## 2020-03-27 DIAGNOSIS — I10 Essential (primary) hypertension: Secondary | ICD-10-CM | POA: Diagnosis not present

## 2020-03-27 DIAGNOSIS — Z7984 Long term (current) use of oral hypoglycemic drugs: Secondary | ICD-10-CM | POA: Diagnosis not present

## 2020-03-27 DIAGNOSIS — Z87891 Personal history of nicotine dependence: Secondary | ICD-10-CM | POA: Diagnosis not present

## 2020-03-27 DIAGNOSIS — Z510 Encounter for antineoplastic radiation therapy: Secondary | ICD-10-CM | POA: Diagnosis not present

## 2020-03-28 ENCOUNTER — Ambulatory Visit
Admission: RE | Admit: 2020-03-28 | Discharge: 2020-03-28 | Disposition: A | Discharge status: Home / Self Care | Payer: Medicare Other | Source: Ambulatory Visit | Attending: Radiation Oncology | Admitting: Radiation Oncology

## 2020-03-28 DIAGNOSIS — Z87891 Personal history of nicotine dependence: Secondary | ICD-10-CM | POA: Diagnosis not present

## 2020-03-28 DIAGNOSIS — R590 Localized enlarged lymph nodes: Secondary | ICD-10-CM | POA: Diagnosis not present

## 2020-03-28 DIAGNOSIS — Z510 Encounter for antineoplastic radiation therapy: Secondary | ICD-10-CM | POA: Diagnosis not present

## 2020-03-28 DIAGNOSIS — C3411 Malignant neoplasm of upper lobe, right bronchus or lung: Secondary | ICD-10-CM | POA: Diagnosis not present

## 2020-03-29 ENCOUNTER — Ambulatory Visit
Admission: RE | Admit: 2020-03-29 | Discharge: 2020-03-29 | Disposition: A | Discharge status: Home / Self Care | Payer: Medicare Other | Source: Ambulatory Visit | Attending: Radiation Oncology | Admitting: Radiation Oncology

## 2020-03-29 DIAGNOSIS — Z510 Encounter for antineoplastic radiation therapy: Secondary | ICD-10-CM | POA: Diagnosis not present

## 2020-03-29 DIAGNOSIS — C3411 Malignant neoplasm of upper lobe, right bronchus or lung: Secondary | ICD-10-CM | POA: Diagnosis not present

## 2020-03-29 DIAGNOSIS — Z87891 Personal history of nicotine dependence: Secondary | ICD-10-CM | POA: Diagnosis not present

## 2020-03-29 DIAGNOSIS — R590 Localized enlarged lymph nodes: Secondary | ICD-10-CM | POA: Diagnosis not present

## 2020-03-30 ENCOUNTER — Ambulatory Visit
Admission: RE | Admit: 2020-03-30 | Discharge: 2020-03-30 | Disposition: A | Discharge status: Home / Self Care | Payer: Medicare Other | Source: Ambulatory Visit | Attending: Radiation Oncology | Admitting: Radiation Oncology

## 2020-03-30 DIAGNOSIS — Z510 Encounter for antineoplastic radiation therapy: Secondary | ICD-10-CM | POA: Diagnosis not present

## 2020-03-30 DIAGNOSIS — R590 Localized enlarged lymph nodes: Secondary | ICD-10-CM | POA: Diagnosis not present

## 2020-03-30 DIAGNOSIS — Z87891 Personal history of nicotine dependence: Secondary | ICD-10-CM | POA: Diagnosis not present

## 2020-03-30 DIAGNOSIS — C3411 Malignant neoplasm of upper lobe, right bronchus or lung: Secondary | ICD-10-CM | POA: Diagnosis not present

## 2020-04-02 ENCOUNTER — Inpatient Hospital Stay: Payer: Medicare Other

## 2020-04-02 ENCOUNTER — Inpatient Hospital Stay: Payer: Medicare Other | Attending: Internal Medicine | Admitting: Internal Medicine

## 2020-04-02 ENCOUNTER — Other Ambulatory Visit: Payer: Self-pay

## 2020-04-02 ENCOUNTER — Ambulatory Visit
Admission: RE | Admit: 2020-04-02 | Discharge: 2020-04-02 | Disposition: A | Discharge status: Home / Self Care | Payer: Medicare Other | Source: Ambulatory Visit | Attending: Radiation Oncology | Admitting: Radiation Oncology

## 2020-04-02 ENCOUNTER — Encounter: Payer: Self-pay | Admitting: Internal Medicine

## 2020-04-02 VITALS — BP 131/71 | HR 51 | Temp 97.8°F | Resp 18 | Ht 69.0 in | Wt 261.3 lb

## 2020-04-02 DIAGNOSIS — Z923 Personal history of irradiation: Secondary | ICD-10-CM | POA: Insufficient documentation

## 2020-04-02 DIAGNOSIS — Z9221 Personal history of antineoplastic chemotherapy: Secondary | ICD-10-CM | POA: Diagnosis not present

## 2020-04-02 DIAGNOSIS — R1310 Dysphagia, unspecified: Secondary | ICD-10-CM | POA: Diagnosis not present

## 2020-04-02 DIAGNOSIS — R600 Localized edema: Secondary | ICD-10-CM | POA: Diagnosis not present

## 2020-04-02 DIAGNOSIS — M47812 Spondylosis without myelopathy or radiculopathy, cervical region: Secondary | ICD-10-CM | POA: Insufficient documentation

## 2020-04-02 DIAGNOSIS — C3411 Malignant neoplasm of upper lobe, right bronchus or lung: Secondary | ICD-10-CM

## 2020-04-02 DIAGNOSIS — Z510 Encounter for antineoplastic radiation therapy: Secondary | ICD-10-CM | POA: Diagnosis not present

## 2020-04-02 DIAGNOSIS — Z79899 Other long term (current) drug therapy: Secondary | ICD-10-CM | POA: Diagnosis not present

## 2020-04-02 DIAGNOSIS — C3490 Malignant neoplasm of unspecified part of unspecified bronchus or lung: Secondary | ICD-10-CM | POA: Diagnosis not present

## 2020-04-02 DIAGNOSIS — Z5111 Encounter for antineoplastic chemotherapy: Secondary | ICD-10-CM | POA: Insufficient documentation

## 2020-04-02 DIAGNOSIS — Z87891 Personal history of nicotine dependence: Secondary | ICD-10-CM | POA: Diagnosis not present

## 2020-04-02 DIAGNOSIS — R590 Localized enlarged lymph nodes: Secondary | ICD-10-CM | POA: Diagnosis not present

## 2020-04-02 DIAGNOSIS — J029 Acute pharyngitis, unspecified: Secondary | ICD-10-CM | POA: Insufficient documentation

## 2020-04-02 LAB — CMP (CANCER CENTER ONLY)
ALT: 13 U/L (ref 0–44)
AST: 12 U/L — ABNORMAL LOW (ref 15–41)
Albumin: 2.9 g/dL — ABNORMAL LOW (ref 3.5–5.0)
Alkaline Phosphatase: 64 U/L (ref 38–126)
Anion gap: 5 (ref 5–15)
BUN: 12 mg/dL (ref 8–23)
CO2: 28 mmol/L (ref 22–32)
Calcium: 8.6 mg/dL — ABNORMAL LOW (ref 8.9–10.3)
Chloride: 107 mmol/L (ref 98–111)
Creatinine: 0.87 mg/dL (ref 0.61–1.24)
GFR, Estimated: >60 mL/min (ref >60)
Glucose, Bld: 122 mg/dL — ABNORMAL HIGH (ref 70–99)
Potassium: 4 mmol/L (ref 3.5–5.1)
Sodium: 140 mmol/L (ref 135–145)
Total Bilirubin: 0.4 mg/dL (ref 0.3–1.2)
Total Protein: 6.3 g/dL — ABNORMAL LOW (ref 6.5–8.1)

## 2020-04-02 LAB — CBC WITH DIFFERENTIAL (CANCER CENTER ONLY)
Abs Immature Granulocytes: 0.02 10*3/uL (ref 0.00–0.07)
Basophils Absolute: 0 10*3/uL (ref 0.0–0.1)
Basophils Relative: 1 %
Eosinophils Absolute: 0 10*3/uL (ref 0.0–0.5)
Eosinophils Relative: 1 %
HCT: 34 % — ABNORMAL LOW (ref 39.0–52.0)
Hemoglobin: 10.9 g/dL — ABNORMAL LOW (ref 13.0–17.0)
Immature Granulocytes: 1 %
Lymphocytes Relative: 10 %
Lymphs Abs: 0.3 10*3/uL — ABNORMAL LOW (ref 0.7–4.0)
MCH: 26.3 pg (ref 26.0–34.0)
MCHC: 32.1 g/dL (ref 30.0–36.0)
MCV: 82.1 fL (ref 80.0–100.0)
Monocytes Absolute: 0.3 10*3/uL (ref 0.1–1.0)
Monocytes Relative: 8 %
Neutro Abs: 2.7 10*3/uL (ref 1.7–7.7)
Neutrophils Relative %: 79 %
Platelet Count: 125 10*3/uL — ABNORMAL LOW (ref 150–400)
RBC: 4.14 MIL/uL — ABNORMAL LOW (ref 4.22–5.81)
RDW: 17.6 % — ABNORMAL HIGH (ref 11.5–15.5)
WBC Count: 3.4 10*3/uL — ABNORMAL LOW (ref 4.0–10.5)
nRBC: 0 % (ref 0.0–0.2)

## 2020-04-02 MED ORDER — SODIUM CHLORIDE 0.9 % IV SOLN
Freq: Once | INTRAVENOUS | Status: AC
  Filled 2020-04-02: qty 250

## 2020-04-02 MED ORDER — DIPHENHYDRAMINE HCL 50 MG/ML IJ SOLN
INTRAMUSCULAR | Status: AC
  Filled 2020-04-02: qty 1

## 2020-04-02 MED ORDER — DIPHENHYDRAMINE HCL 50 MG/ML IJ SOLN
50.0000 mg | Freq: Once | INTRAMUSCULAR | Status: AC
  Administered 2020-04-02: 50 mg via INTRAVENOUS

## 2020-04-02 MED ORDER — FAMOTIDINE IN NACL 20-0.9 MG/50ML-% IV SOLN
INTRAVENOUS | Status: AC
  Filled 2020-04-02: qty 50

## 2020-04-02 MED ORDER — SODIUM CHLORIDE 0.9 % IV SOLN
263.6000 mg | Freq: Once | INTRAVENOUS | Status: AC
  Administered 2020-04-02: 260 mg via INTRAVENOUS
  Filled 2020-04-02: qty 26

## 2020-04-02 MED ORDER — HEPARIN SOD (PORK) LOCK FLUSH 100 UNIT/ML IV SOLN
500.0000 [IU] | Freq: Once | INTRAVENOUS | Status: AC | PRN
  Administered 2020-04-02: 500 [IU]
  Filled 2020-04-02: qty 5

## 2020-04-02 MED ORDER — SODIUM CHLORIDE 0.9 % IV SOLN
20.0000 mg | Freq: Once | INTRAVENOUS | Status: AC
  Administered 2020-04-02: 20 mg via INTRAVENOUS
  Filled 2020-04-02: qty 20

## 2020-04-02 MED ORDER — PALONOSETRON HCL INJECTION 0.25 MG/5ML
0.2500 mg | Freq: Once | INTRAVENOUS | Status: AC
  Administered 2020-04-02: 0.25 mg via INTRAVENOUS

## 2020-04-02 MED ORDER — SODIUM CHLORIDE 0.9 % IV SOLN
45.0000 mg/m2 | Freq: Once | INTRAVENOUS | Status: AC
  Administered 2020-04-02: 108 mg via INTRAVENOUS
  Filled 2020-04-02: qty 18

## 2020-04-02 MED ORDER — SODIUM CHLORIDE 0.9% FLUSH
10.0000 mL | INTRAVENOUS | Status: DC | PRN
  Administered 2020-04-02: 10 mL
  Filled 2020-04-02: qty 10

## 2020-04-02 MED ORDER — PALONOSETRON HCL INJECTION 0.25 MG/5ML
INTRAVENOUS | Status: AC
  Filled 2020-04-02: qty 5

## 2020-04-02 MED ORDER — FAMOTIDINE IN NACL 20-0.9 MG/50ML-% IV SOLN
20.0000 mg | Freq: Once | INTRAVENOUS | Status: AC
  Administered 2020-04-02: 20 mg via INTRAVENOUS

## 2020-04-02 NOTE — Patient Instructions (Signed)
[facility] Discharge Instructions for Patients Receiving Chemotherapy  Today you received the following chemotherapy agents: Paclitaxel and Carboplatin  To help prevent nausea and vomiting after your treatment, we encourage you to take your nausea medication  as prescribed.    If you develop nausea and vomiting that is not controlled by your nausea medication, call the clinic.   BELOW ARE SYMPTOMS THAT SHOULD BE REPORTED IMMEDIATELY:  *FEVER GREATER THAN 100.5 F  *CHILLS WITH OR WITHOUT FEVER  NAUSEA AND VOMITING THAT IS NOT CONTROLLED WITH YOUR NAUSEA MEDICATION  *UNUSUAL SHORTNESS OF BREATH  *UNUSUAL BRUISING OR BLEEDING  TENDERNESS IN MOUTH AND THROAT WITH OR WITHOUT PRESENCE OF ULCERS  *URINARY PROBLEMS  *BOWEL PROBLEMS  UNUSUAL RASH Items with * indicate a potential emergency and should be followed up as soon as possible.  Feel free to call the clinic should you have any questions or concerns. The clinic phone number is [phone].  Please show the [facility] at check-in to the Emergency Department and triage nurse.

## 2020-04-02 NOTE — Progress Notes (Signed)
Patient stable and with no complaints upon departure from infusion clinic.

## 2020-04-02 NOTE — Progress Notes (Signed)
[facility] [phone]   [phone]  OFFICE PROGRESS NOTE  [name], MD [place] [address]  DIAGNOSIS: Stage IIIa (T4, N0, M0) non-small cell lung cancer, squamous cell carcinoma presented with large right upper lobe lung mass diagnosed in October 2021.  PRIOR THERAPY: None  CURRENT THERAPY: Concurrent chemoradiation with weekly carboplatin for AUC of 2 and paclitaxel 45 mg/M2. Status post 5 cycles. First dose on 02/27/20  INTERVAL HISTORY: Billy Hall 74 y.o. male returns to the clinic today for follow-up visit.  The patient is feeling fine today with no concerning complaints except for mild odynophagia from the radiation treatment.  He denied having any chest pain, shortness of breath, cough or hemoptysis.  He denied having any nausea, vomiting, diarrhea or constipation.  He has no significant weight loss or night sweats.  Is here today for evaluation before starting cycle #6 of his treatment.  MEDICAL HISTORY: Past Medical History:  Diagnosis Date  . Arthritis   . Depressive disorder   . Diabetes mellitus without complication ([place])   . Fatigue   . Headache(784.0)   . Hyperlipidemia   . Hypertension   . Hypothyroidism   . Snoring     ALLERGIES:  is allergic to ace inhibitors.  MEDICATIONS:  Current Outpatient Medications  Medication Sig Dispense Refill  . acetaminophen (TYLENOL 8 HOUR) 650 MG CR tablet Take 650 mg by mouth every 8 (eight) hours as needed for pain.    Marland Kitchen aspirin EC 81 MG tablet Take 81 mg by mouth daily.    . cholecalciferol (VITAMIN D3) 25 MCG (1000 UNIT) tablet Take 1,000 Units by mouth daily.    Marland Kitchen levothyroxine (SYNTHROID) 88 MCG tablet Take 88 mcg by mouth daily before breakfast.     . losartan (COZAAR) 50 MG tablet Take 50 mg by mouth daily.     . metFORMIN (GLUCOPHAGE) 500 MG tablet Take 500 mg by mouth 2 (two) times daily with a meal.     . ONETOUCH ULTRA test strip 1 each  2 (two) times daily.    . prochlorperazine (COMPAZINE) 10 MG tablet Take 1 tablet (10 mg total) by mouth every 6 (six) hours as needed for nausea or vomiting. 30 tablet 0  . sildenafil (REVATIO) 20 MG tablet Take 20 mg by mouth daily as needed (ED).     . simvastatin (ZOCOR) 10 MG tablet Take 10 mg by mouth every evening.     . sucralfate (CARAFATE) 1 g tablet Take 1 tablet (1 g total) by mouth 4 (four) times daily -  with meals and at bedtime. Crush 1 tablet in 1 oz water and drink 5 min before meals for radiation induced esophagitis 120 tablet 2  . tamsulosin (FLOMAX) 0.4 MG CAPS capsule Take 0.4 mg by mouth daily.     No current facility-administered medications for this visit.    SURGICAL HISTORY:  Past Surgical History:  Procedure Laterality Date  . COLONOSCOPY N/A 03/08/2013   Procedure: COLONOSCOPY;  Surgeon: Jamesetta So, MD;  Location: [facility];  Service: Gastroenterology;  Laterality: N/A;  . IR IMAGING GUIDED PORT INSERTION  03/08/2020  . IR US GUIDE BX ASP/DRAIN  03/08/2020  . KNEE ARTHROSCOPY Right    2006 By Dr. Luna Glasgow at [facility].   . THYROID SURGERY    . VIDEO BRONCHOSCOPY WITH ENDOBRONCHIAL ULTRASOUND N/A 01/30/2020   Procedure: VIDEO BRONCHOSCOPY WITH ENDOBRONCHIAL ULTRASOUND;  Surgeon: Melrose Nakayama, MD;  Location: [facility] OR;  Service: Thoracic;  Laterality: N/A;    REVIEW OF SYSTEMS:  A comprehensive review of systems was negative except for: Ears, nose, mouth, throat, and face: positive for sore throat Gastrointestinal: positive for odynophagia   PHYSICAL EXAMINATION: General appearance: alert, cooperative and no distress Head: Normocephalic, without obvious abnormality, atraumatic Neck: no adenopathy, no JVD, supple, symmetrical, trachea midline and thyroid not enlarged, symmetric, no tenderness/mass/nodules Lymph nodes: Cervical, supraclavicular, and axillary nodes normal. Resp: clear to auscultation bilaterally Back: symmetric, no curvature. ROM normal.  No CVA tenderness. Cardio: regular rate and rhythm, S1, S2 normal, no murmur, click, rub or gallop GI: soft, non-tender; bowel sounds normal; no masses,  no organomegaly Extremities: extremities normal, atraumatic, no cyanosis or edema  ECOG PERFORMANCE STATUS: 1 - Symptomatic but completely ambulatory  Blood pressure 131/71, pulse (!) 51, temperature 97.8 F (36.6 C), temperature source Tympanic, resp. rate 18, height '5\' 9"'  (1.753 m), weight 261 lb 4.8 oz (118.5 kg), SpO2 100 %.  LABORATORY DATA: Lab Results  Component Value Date   WBC 3.4 (L) 03/26/2020   HGB 11.4 (L) 03/26/2020   HCT 34.9 (L) 03/26/2020   MCV 81.5 03/26/2020   PLT 142 (L) 03/26/2020      Chemistry      Component Value Date/Time   NA 140 03/26/2020 1234   K 4.1 03/26/2020 1234   CL 106 03/26/2020 1234   CO2 24 03/26/2020 1234   BUN 10 03/26/2020 1234   CREATININE 0.78 03/26/2020 1234      Component Value Date/Time   CALCIUM 9.0 03/26/2020 1234   ALKPHOS 62 03/26/2020 1234   AST 12 (L) 03/26/2020 1234   ALT 13 03/26/2020 1234   BILITOT 0.5 03/26/2020 1234       RADIOGRAPHIC STUDIES: CT SOFT TISSUE NECK W CONTRAST  Result Date: 03/06/2020 CLINICAL DATA:  Malignant neoplasm right upper lobe. Left parotid mass. EXAM: CT NECK WITH CONTRAST TECHNIQUE: Multidetector CT imaging of the neck was performed using the standard protocol following the bolus administration of intravenous contrast. CONTRAST:  67m OMNIPAQUE IOHEXOL 300 MG/ML  SOLN COMPARISON:  PET-CT 02/06/2020 FINDINGS: Pharynx and larynx: Normal. No mass or swelling. Salivary glands: Enhancing soft tissue nodule along the posterior margin of the left parotid gland corresponds to the PET finding. This nodule measures 6 x 10 mm this is hypermetabolic on PET and shows homogeneous enhancement on CT. This could be a parotid tumor such as Warthin's tumor or adjacent lymph node. Right parotid normal.  Submandibular normal bilaterally. Thyroid: Parotidectomy.  Small amount residual parotid tissue bilaterally without mass lesion. Lymph nodes: Possible lymph node versus parotid tumor on the left as described above. Otherwise no enlarged lymph nodes. Vascular: Normal vascular enhancement. Limited intracranial: Negative Visualized orbits: Negative Mastoids and visualized paranasal sinuses: Mild mucosal edema paranasal sinuses. Prior sinus surgery right maxillary sinus. Skeleton: No acute skeletal abnormality.  Mild cervical spondylosis. Upper chest: Large right upper lobe mass lesion as noted on prior PET-CT. Left upper lobe clear. Other: None IMPRESSION: 6 x 10 mm enhancing nodule along the posterior margin of the left parotid gland. This could represent a Warthin's tumor. Warthin's tumors can be hypermetabolic on PET as is this lesion. Also possible is a metastatic lymph node. Tissue sampling recommended. Right upper lobe mass compatible with carcinoma. Electronically Signed   By: CFranchot GalloM.D.   On: 03/06/2020 13:26   IR [place] Bx Asp/Drain  Result Date: 03/08/2020 INDICATION: Recent diagnosis of lung cancer. In  need of durable intravenous access for chemotherapy administration Patient also with indeterminate hypermetabolic nodule within the left parotid gland noted on PET-CT performed 02/06/2020. Request made for ultrasound-guided biopsy for tissue diagnostic purposes. EXAM: 1. IMPLANTED PORT A CATH PLACEMENT WITH ULTRASOUND AND FLUOROSCOPIC GUIDANCE 2. ULTRASOUND-GUIDED BIOPSY OF INDETERMINATE LEFT-SIDED PAROTID NODULE COMPARISON:  PET-CT-02/06/2020; soft tissue neck CT-03/06/2020 MEDICATIONS: Ancef 2 gm IV; The antibiotic was administered within an appropriate time interval prior to skin puncture. ANESTHESIA/SEDATION: Moderate (conscious) sedation was employed during both procedures. A total of Versed 3 mg and Fentanyl 100 mcg was administered intravenously for both procedures. Moderate Sedation Time: 45 minutes. The patient's level of consciousness and  vital signs were monitored continuously by radiology nursing throughout the procedure under my direct supervision. CONTRAST:  None FLUOROSCOPY TIME:  42 seconds (23 mGy) COMPLICATIONS: None immediate. PROCEDURE: The procedure, risks, benefits, and alternatives were explained to the patient regarding both the image guided left-sided parotid nodule biopsy as well as the [facility] a catheter placement. Questions regarding the procedures was encouraged and answered. The patient understands and consents to both procedures. Attention was first paid towards ultrasound-guided biopsy of the indeterminate left-sided parotid nodule. Preprocedural imaging demonstrates an approximately 1.0 x 0.6 cm hypoechoic nodule within the posterior aspect of the left parotid gland correlating with the hypermetabolic nodule seen on preceding PET-CT image 225, series 605. The skin overlying the operative site with prepped and draped in usual sterile fashion. Under direct ultrasound guidance, 5 core needle biopsies were obtained of the indeterminate left parotid gland nodule. Multiple ultrasound images were saved procedural documentation purposes. Samples were placed in saline and submitted to the laboratory for analysis. Postprocedural imaging was negative for presence of a hematoma. Dressing was applied Attention was now paid towards placement Port a catheter. The right neck and chest were prepped with chlorhexidine in a sterile fashion, and a sterile drape was applied covering the operative field. Maximum barrier sterile technique with sterile gowns and gloves were used for the procedure. A timeout was performed prior to the initiation of the procedure. Local anesthesia was provided with 1% lidocaine with epinephrine. After creating a small venotomy incision, a micropuncture kit was utilized to access the internal jugular vein. Real-time ultrasound guidance was utilized for vascular access including the acquisition of a permanent ultrasound  image documenting patency of the accessed vessel. The microwire was utilized to measure appropriate catheter length. A subcutaneous port pocket was then created along the upper chest wall utilizing a combination of sharp and blunt dissection. The pocket was irrigated with sterile saline. A single lumen "ISP" sized power injectable port was chosen for placement. The 8 Fr catheter was tunneled from the port pocket site to the venotomy incision. The port was placed in the pocket. The external catheter was trimmed to appropriate length. At the venotomy, an 8 Fr peel-away sheath was placed over a guidewire under fluoroscopic guidance. The catheter was then placed through the sheath and the sheath was removed. Final catheter positioning was confirmed and documented with a fluoroscopic spot radiograph. The port was accessed with a Huber needle, aspirated and flushed with heparinized saline. The venotomy site was closed with an interrupted 4-0 Vicryl suture. The port pocket incision was closed with interrupted 2-0 Vicryl suture. The skin was opposed with a running subcuticular 4-0 Vicryl suture. Dermabond and Steri-strips were applied to both incisions. Dressings were applied. The patient tolerated the procedure well without immediate post procedural complication. FINDINGS: Sonographic evaluation confirms appropriate positioning of the core  needle biopsy device within the approximately 1 cm hypoechoic nodule within the posterior aspect of the left parotid gland. After catheter placement, the tip lies within the superior cavoatrial junction. The catheter aspirates and flushes normally and is ready for immediate use. IMPRESSION: 1. Technically successful ultrasound-guided biopsy of indeterminate hypermetabolic left parotid gland. 2. Successful placement of a right internal jugular approach power injectable Port-A-Cath. The catheter is ready for immediate use. Electronically Signed   By: Sandi Mariscal M.D.   On: 03/08/2020 16:14    IR IMAGING GUIDED PORT INSERTION  Result Date: 03/08/2020 INDICATION: Recent diagnosis of lung cancer. In need of durable intravenous access for chemotherapy administration Patient also with indeterminate hypermetabolic nodule within the left parotid gland noted on PET-CT performed 02/06/2020. Request made for ultrasound-guided biopsy for tissue diagnostic purposes. EXAM: 1. IMPLANTED PORT A CATH PLACEMENT WITH ULTRASOUND AND FLUOROSCOPIC GUIDANCE 2. ULTRASOUND-GUIDED BIOPSY OF INDETERMINATE LEFT-SIDED PAROTID NODULE COMPARISON:  PET-CT-02/06/2020; soft tissue neck CT-03/06/2020 MEDICATIONS: Ancef 2 gm IV; The antibiotic was administered within an appropriate time interval prior to skin puncture. ANESTHESIA/SEDATION: Moderate (conscious) sedation was employed during both procedures. A total of Versed 3 mg and Fentanyl 100 mcg was administered intravenously for both procedures. Moderate Sedation Time: 45 minutes. The patient's level of consciousness and vital signs were monitored continuously by radiology nursing throughout the procedure under my direct supervision. CONTRAST:  None FLUOROSCOPY TIME:  42 seconds (23 mGy) COMPLICATIONS: None immediate. PROCEDURE: The procedure, risks, benefits, and alternatives were explained to the patient regarding both the image guided left-sided parotid nodule biopsy as well as the [facility] a catheter placement. Questions regarding the procedures was encouraged and answered. The patient understands and consents to both procedures. Attention was first paid towards ultrasound-guided biopsy of the indeterminate left-sided parotid nodule. Preprocedural imaging demonstrates an approximately 1.0 x 0.6 cm hypoechoic nodule within the posterior aspect of the left parotid gland correlating with the hypermetabolic nodule seen on preceding PET-CT image 225, series 605. The skin overlying the operative site with prepped and draped in usual sterile fashion. Under direct ultrasound guidance,  5 core needle biopsies were obtained of the indeterminate left parotid gland nodule. Multiple ultrasound images were saved procedural documentation purposes. Samples were placed in saline and submitted to the laboratory for analysis. Postprocedural imaging was negative for presence of a hematoma. Dressing was applied Attention was now paid towards placement Port a catheter. The right neck and chest were prepped with chlorhexidine in a sterile fashion, and a sterile drape was applied covering the operative field. Maximum barrier sterile technique with sterile gowns and gloves were used for the procedure. A timeout was performed prior to the initiation of the procedure. Local anesthesia was provided with 1% lidocaine with epinephrine. After creating a small venotomy incision, a micropuncture kit was utilized to access the internal jugular vein. Real-time ultrasound guidance was utilized for vascular access including the acquisition of a permanent ultrasound image documenting patency of the accessed vessel. The microwire was utilized to measure appropriate catheter length. A subcutaneous port pocket was then created along the upper chest wall utilizing a combination of sharp and blunt dissection. The pocket was irrigated with sterile saline. A single lumen "ISP" sized power injectable port was chosen for placement. The 8 Fr catheter was tunneled from the port pocket site to the venotomy incision. The port was placed in the pocket. The external catheter was trimmed to appropriate length. At the venotomy, an 8 Fr peel-away sheath was placed over a guidewire under  fluoroscopic guidance. The catheter was then placed through the sheath and the sheath was removed. Final catheter positioning was confirmed and documented with a fluoroscopic spot radiograph. The port was accessed with a Huber needle, aspirated and flushed with heparinized saline. The venotomy site was closed with an interrupted 4-0 Vicryl suture. The port  pocket incision was closed with interrupted 2-0 Vicryl suture. The skin was opposed with a running subcuticular 4-0 Vicryl suture. Dermabond and Steri-strips were applied to both incisions. Dressings were applied. The patient tolerated the procedure well without immediate post procedural complication. FINDINGS: Sonographic evaluation confirms appropriate positioning of the core needle biopsy device within the approximately 1 cm hypoechoic nodule within the posterior aspect of the left parotid gland. After catheter placement, the tip lies within the superior cavoatrial junction. The catheter aspirates and flushes normally and is ready for immediate use. IMPRESSION: 1. Technically successful ultrasound-guided biopsy of indeterminate hypermetabolic left parotid gland. 2. Successful placement of a right internal jugular approach power injectable Port-A-Cath. The catheter is ready for immediate use. Electronically Signed   By: Sandi Mariscal M.D.   On: 03/08/2020 16:14    ASSESSMENT AND PLAN: This is a very pleasant 74 years old African-American male recently diagnosed with a stage IIIA non-small cell lung cancer, squamous cell carcinoma presented with large right upper lobe lung mass in October 2021. The patient is currently undergoing a course of concurrent chemoradiation with weekly carboplatin and paclitaxel status post 5 cycles.   He has been tolerating this treatment well with no concerning adverse effect except for mild sore throat as well as odynophagia. I recommended for him to proceed with cycle #6 today as planned.  He is expected to complete the course of radiotherapy on April 05, 2020. I will see him back for follow-up visit in 1 months for evaluation with repeat CT scan of the chest for restaging of his disease. He was advised to call immediately if he has any concerning symptoms in the interval. The patient voices understanding of current disease status and treatment options and is in agreement  with the current care plan.  All questions were answered. The patient knows to call the clinic with any problems, questions or concerns. We can certainly see the patient much sooner if necessary.   Disclaimer: This note was dictated with voice recognition software. Similar sounding words can inadvertently be transcribed and may not be corrected upon review.

## 2020-04-03 ENCOUNTER — Ambulatory Visit
Admission: RE | Admit: 2020-04-03 | Discharge: 2020-04-03 | Disposition: A | Discharge status: Home / Self Care | Payer: Medicare Other | Source: Ambulatory Visit | Attending: Radiation Oncology | Admitting: Radiation Oncology

## 2020-04-03 ENCOUNTER — Ambulatory Visit: Payer: Medicare Other

## 2020-04-03 DIAGNOSIS — Z87891 Personal history of nicotine dependence: Secondary | ICD-10-CM | POA: Diagnosis not present

## 2020-04-03 DIAGNOSIS — Z510 Encounter for antineoplastic radiation therapy: Secondary | ICD-10-CM | POA: Diagnosis not present

## 2020-04-03 DIAGNOSIS — C3411 Malignant neoplasm of upper lobe, right bronchus or lung: Secondary | ICD-10-CM | POA: Diagnosis not present

## 2020-04-03 DIAGNOSIS — R590 Localized enlarged lymph nodes: Secondary | ICD-10-CM | POA: Diagnosis not present

## 2020-04-04 ENCOUNTER — Ambulatory Visit
Admission: RE | Admit: 2020-04-04 | Discharge: 2020-04-04 | Disposition: A | Discharge status: Home / Self Care | Payer: Medicare Other | Source: Ambulatory Visit | Attending: Radiation Oncology | Admitting: Radiation Oncology

## 2020-04-04 ENCOUNTER — Other Ambulatory Visit: Payer: Self-pay

## 2020-04-04 ENCOUNTER — Ambulatory Visit: Payer: Medicare Other

## 2020-04-04 DIAGNOSIS — Z510 Encounter for antineoplastic radiation therapy: Secondary | ICD-10-CM | POA: Diagnosis not present

## 2020-04-04 DIAGNOSIS — R590 Localized enlarged lymph nodes: Secondary | ICD-10-CM | POA: Diagnosis not present

## 2020-04-04 DIAGNOSIS — C3411 Malignant neoplasm of upper lobe, right bronchus or lung: Secondary | ICD-10-CM | POA: Diagnosis not present

## 2020-04-04 DIAGNOSIS — Z87891 Personal history of nicotine dependence: Secondary | ICD-10-CM | POA: Diagnosis not present

## 2020-04-05 ENCOUNTER — Encounter: Payer: Self-pay | Admitting: Radiation Oncology

## 2020-04-05 ENCOUNTER — Ambulatory Visit: Payer: Medicare Other

## 2020-04-05 ENCOUNTER — Ambulatory Visit
Admission: RE | Admit: 2020-04-05 | Discharge: 2020-04-05 | Disposition: A | Discharge status: Home / Self Care | Payer: Medicare Other | Source: Ambulatory Visit | Attending: Radiation Oncology | Admitting: Radiation Oncology

## 2020-04-05 DIAGNOSIS — Z87891 Personal history of nicotine dependence: Secondary | ICD-10-CM | POA: Diagnosis not present

## 2020-04-05 DIAGNOSIS — C3411 Malignant neoplasm of upper lobe, right bronchus or lung: Secondary | ICD-10-CM | POA: Diagnosis not present

## 2020-04-05 DIAGNOSIS — R590 Localized enlarged lymph nodes: Secondary | ICD-10-CM | POA: Diagnosis not present

## 2020-04-05 DIAGNOSIS — Z510 Encounter for antineoplastic radiation therapy: Secondary | ICD-10-CM | POA: Diagnosis not present

## 2020-04-06 ENCOUNTER — Telehealth: Payer: Self-pay | Admitting: Internal Medicine

## 2020-04-06 ENCOUNTER — Ambulatory Visit: Payer: Medicare Other

## 2020-04-06 NOTE — Telephone Encounter (Signed)
Scheduled per los. Called and spoke with patient. Confirmed appt 

## 2020-04-09 ENCOUNTER — Other Ambulatory Visit: Payer: Medicare Other

## 2020-04-09 ENCOUNTER — Ambulatory Visit: Payer: Medicare Other

## 2020-04-10 ENCOUNTER — Ambulatory Visit: Payer: Medicare Other

## 2020-04-16 ENCOUNTER — Telehealth: Payer: Self-pay | Admitting: *Deleted

## 2020-04-16 NOTE — Telephone Encounter (Signed)
Pt left vm stating that he is having some blood in his stool.  Called pt & he reports bright red blood in toilet with BM.  He had some constipation issues.  Bleeding only with stool.  Discussed stool softeners/miralax to keep stools soft.  Hemorroid cream externally.  Add fiber-fruits, veggies.  If bleeding worse or continues should call his PCP.  Message to Dr Julien Nordmann.

## 2020-04-27 DIAGNOSIS — I10 Essential (primary) hypertension: Secondary | ICD-10-CM | POA: Diagnosis not present

## 2020-04-27 DIAGNOSIS — Z7984 Long term (current) use of oral hypoglycemic drugs: Secondary | ICD-10-CM | POA: Diagnosis not present

## 2020-04-27 DIAGNOSIS — E1165 Type 2 diabetes mellitus with hyperglycemia: Secondary | ICD-10-CM | POA: Diagnosis not present

## 2020-05-01 ENCOUNTER — Other Ambulatory Visit: Payer: Self-pay

## 2020-05-01 ENCOUNTER — Inpatient Hospital Stay: Payer: Medicare Other | Attending: Internal Medicine

## 2020-05-01 ENCOUNTER — Ambulatory Visit (HOSPITAL_COMMUNITY)
Admission: RE | Admit: 2020-05-01 | Discharge: 2020-05-01 | Disposition: A | Discharge status: Home / Self Care | Payer: Medicare Other | Source: Ambulatory Visit | Attending: Internal Medicine | Admitting: Internal Medicine

## 2020-05-01 ENCOUNTER — Inpatient Hospital Stay: Payer: Medicare Other

## 2020-05-01 DIAGNOSIS — Z79899 Other long term (current) drug therapy: Secondary | ICD-10-CM | POA: Insufficient documentation

## 2020-05-01 DIAGNOSIS — C3490 Malignant neoplasm of unspecified part of unspecified bronchus or lung: Secondary | ICD-10-CM

## 2020-05-01 DIAGNOSIS — C3411 Malignant neoplasm of upper lobe, right bronchus or lung: Secondary | ICD-10-CM | POA: Insufficient documentation

## 2020-05-01 DIAGNOSIS — J9809 Other diseases of bronchus, not elsewhere classified: Secondary | ICD-10-CM | POA: Diagnosis not present

## 2020-05-01 DIAGNOSIS — R1310 Dysphagia, unspecified: Secondary | ICD-10-CM | POA: Diagnosis not present

## 2020-05-01 DIAGNOSIS — Z5112 Encounter for antineoplastic immunotherapy: Secondary | ICD-10-CM | POA: Insufficient documentation

## 2020-05-01 DIAGNOSIS — Z95828 Presence of other vascular implants and grafts: Secondary | ICD-10-CM | POA: Insufficient documentation

## 2020-05-01 DIAGNOSIS — R0602 Shortness of breath: Secondary | ICD-10-CM | POA: Insufficient documentation

## 2020-05-01 DIAGNOSIS — J984 Other disorders of lung: Secondary | ICD-10-CM | POA: Diagnosis not present

## 2020-05-01 DIAGNOSIS — R059 Cough, unspecified: Secondary | ICD-10-CM | POA: Diagnosis not present

## 2020-05-01 DIAGNOSIS — R5383 Other fatigue: Secondary | ICD-10-CM | POA: Insufficient documentation

## 2020-05-01 DIAGNOSIS — I700 Atherosclerosis of aorta: Secondary | ICD-10-CM | POA: Diagnosis not present

## 2020-05-01 LAB — CBC WITH DIFFERENTIAL (CANCER CENTER ONLY)
Abs Immature Granulocytes: 0.02 10*3/uL (ref 0.00–0.07)
Basophils Absolute: 0 10*3/uL (ref 0.0–0.1)
Basophils Relative: 1 %
Eosinophils Absolute: 0 10*3/uL (ref 0.0–0.5)
Eosinophils Relative: 1 %
HCT: 30.6 % — ABNORMAL LOW (ref 39.0–52.0)
Hemoglobin: 9.9 g/dL — ABNORMAL LOW (ref 13.0–17.0)
Immature Granulocytes: 0 %
Lymphocytes Relative: 11 %
Lymphs Abs: 0.7 10*3/uL (ref 0.7–4.0)
MCH: 28.4 pg (ref 26.0–34.0)
MCHC: 32.4 g/dL (ref 30.0–36.0)
MCV: 87.9 fL (ref 80.0–100.0)
Monocytes Absolute: 0.7 10*3/uL (ref 0.1–1.0)
Monocytes Relative: 11 %
Neutro Abs: 5 10*3/uL (ref 1.7–7.7)
Neutrophils Relative %: 76 %
Platelet Count: 204 10*3/uL (ref 150–400)
RBC: 3.48 MIL/uL — ABNORMAL LOW (ref 4.22–5.81)
RDW: 21.6 % — ABNORMAL HIGH (ref 11.5–15.5)
WBC Count: 6.5 10*3/uL (ref 4.0–10.5)
nRBC: 0 % (ref 0.0–0.2)

## 2020-05-01 LAB — CMP (CANCER CENTER ONLY)
ALT: 12 U/L (ref 0–44)
AST: 13 U/L — ABNORMAL LOW (ref 15–41)
Albumin: 3.1 g/dL — ABNORMAL LOW (ref 3.5–5.0)
Alkaline Phosphatase: 72 U/L (ref 38–126)
Anion gap: 6 (ref 5–15)
BUN: 12 mg/dL (ref 8–23)
CO2: 28 mmol/L (ref 22–32)
Calcium: 8.7 mg/dL — ABNORMAL LOW (ref 8.9–10.3)
Chloride: 106 mmol/L (ref 98–111)
Creatinine: 0.79 mg/dL (ref 0.61–1.24)
GFR, Estimated: >60 mL/min (ref >60)
Glucose, Bld: 153 mg/dL — ABNORMAL HIGH (ref 70–99)
Potassium: 4.1 mmol/L (ref 3.5–5.1)
Sodium: 140 mmol/L (ref 135–145)
Total Bilirubin: 0.6 mg/dL (ref 0.3–1.2)
Total Protein: 6.7 g/dL (ref 6.5–8.1)

## 2020-05-01 MED ORDER — HEPARIN SOD (PORK) LOCK FLUSH 100 UNIT/ML IV SOLN: 500.0000 [IU] | Freq: Once | INTRAVENOUS | Status: AC

## 2020-05-01 MED ORDER — SODIUM CHLORIDE 0.9% FLUSH
10.0000 mL | Freq: Once | INTRAVENOUS | Status: AC
  Administered 2020-05-01: 10 mL
  Filled 2020-05-01: qty 10

## 2020-05-01 MED ORDER — HEPARIN SOD (PORK) LOCK FLUSH 100 UNIT/ML IV SOLN
INTRAVENOUS | Status: AC
  Administered 2020-05-01: 500 [IU] via INTRAVENOUS
  Filled 2020-05-01: qty 5

## 2020-05-01 MED ORDER — IOHEXOL 300 MG/ML  SOLN
75.0000 mL | Freq: Once | INTRAMUSCULAR | Status: AC | PRN
  Administered 2020-05-01: 75 mL via INTRAVENOUS

## 2020-05-03 ENCOUNTER — Inpatient Hospital Stay: Payer: Medicare Other | Admitting: Internal Medicine

## 2020-05-03 ENCOUNTER — Other Ambulatory Visit: Payer: Self-pay

## 2020-05-03 VITALS — BP 146/77 | HR 96 | Temp 98.6°F | Resp 18 | Ht 69.0 in | Wt 264.0 lb

## 2020-05-03 DIAGNOSIS — R0602 Shortness of breath: Secondary | ICD-10-CM | POA: Diagnosis not present

## 2020-05-03 DIAGNOSIS — R059 Cough, unspecified: Secondary | ICD-10-CM | POA: Diagnosis not present

## 2020-05-03 DIAGNOSIS — Z79899 Other long term (current) drug therapy: Secondary | ICD-10-CM | POA: Diagnosis not present

## 2020-05-03 DIAGNOSIS — R1310 Dysphagia, unspecified: Secondary | ICD-10-CM | POA: Diagnosis not present

## 2020-05-03 DIAGNOSIS — Z5112 Encounter for antineoplastic immunotherapy: Secondary | ICD-10-CM | POA: Insufficient documentation

## 2020-05-03 DIAGNOSIS — C3411 Malignant neoplasm of upper lobe, right bronchus or lung: Secondary | ICD-10-CM | POA: Diagnosis not present

## 2020-05-03 DIAGNOSIS — R5383 Other fatigue: Secondary | ICD-10-CM | POA: Diagnosis not present

## 2020-05-03 DIAGNOSIS — I700 Atherosclerosis of aorta: Secondary | ICD-10-CM | POA: Diagnosis not present

## 2020-05-03 NOTE — Progress Notes (Signed)
[facility] [phone]   [phone]  OFFICE PROGRESS NOTE  [name], MD [place] [address]  DIAGNOSIS: Stage IIIa (T4, N0, M0) non-small cell lung cancer, squamous cell carcinoma presented with large right upper lobe lung mass diagnosed in October 2021.  PRIOR THERAPY: Concurrent chemoradiation with weekly carboplatin for AUC of 2 and paclitaxel 45 mg/M2. Status post 6 cycles. First dose on 02/27/20.  Last dose was given April 02, 2020.  CURRENT THERAPY:  Consolidation treatment with immunotherapy with Imfinzi 1500 mg IV every 4 weeks.  First dose May 10, 2020.  INTERVAL HISTORY: Billy Hall 75 y.o. male returns to the clinic today for follow-up visit.  The patient is doing fine today with no concerning complaints.  He has less shortness of breath than before but continues to have mild cough productive of whitish sputum with no significant chest pain or hemoptysis.  His odynophagia has improved.  He denied having any nausea, vomiting, diarrhea or constipation.  He denied having any headache or visual changes.  He has no recent weight loss or night sweats.  The patient tolerated the previous course of his concurrent chemoradiation fairly well except for the odynophagia.  He had repeat CT scan of the chest performed recently and he is here for evaluation and discussion of his scan results and treatment options.  MEDICAL HISTORY: Past Medical History:  Diagnosis Date  . Arthritis   . Depressive disorder   . Diabetes mellitus without complication ([place])   . Fatigue   . Headache(784.0)   . Hyperlipidemia   . Hypertension   . Hypothyroidism   . Snoring     ALLERGIES:  is allergic to ace inhibitors.  MEDICATIONS:  Current Outpatient Medications  Medication Sig Dispense Refill  . acetaminophen (TYLENOL 8 HOUR) 650 MG CR tablet Take 650 mg by mouth every 8 (eight) hours as needed for pain.    Marland Kitchen aspirin EC  81 MG tablet Take 81 mg by mouth daily.    . cholecalciferol (VITAMIN D3) 25 MCG (1000 UNIT) tablet Take 1,000 Units by mouth daily.    Marland Kitchen levothyroxine (SYNTHROID) 88 MCG tablet Take 88 mcg by mouth daily before breakfast.     . losartan (COZAAR) 50 MG tablet Take 50 mg by mouth daily.     . metFORMIN (GLUCOPHAGE) 500 MG tablet Take 500 mg by mouth 2 (two) times daily with a meal.     . ONETOUCH ULTRA test strip 1 each 2 (two) times daily.    . prochlorperazine (COMPAZINE) 10 MG tablet Take 1 tablet (10 mg total) by mouth every 6 (six) hours as needed for nausea or vomiting. 30 tablet 0  . sildenafil (REVATIO) 20 MG tablet Take 20 mg by mouth daily as needed (ED).     . simvastatin (ZOCOR) 10 MG tablet Take 10 mg by mouth every evening.     . sucralfate (CARAFATE) 1 g tablet Take 1 tablet (1 g total) by mouth 4 (four) times daily -  with meals and at bedtime. Crush 1 tablet in 1 oz water and drink 5 min before meals for radiation induced esophagitis 120 tablet 2  . tamsulosin (FLOMAX) 0.4 MG CAPS capsule Take 0.4 mg by mouth daily.     No current facility-administered medications for this visit.    SURGICAL HISTORY:  Past Surgical History:  Procedure Laterality Date  . COLONOSCOPY N/A 03/08/2013   Procedure: COLONOSCOPY;  Surgeon: Jeannette How  Arnoldo Morale, MD;  Location: [facility];  Service: Gastroenterology;  Laterality: N/A;  . IR IMAGING GUIDED PORT INSERTION  03/08/2020  . IR US GUIDE BX ASP/DRAIN  03/08/2020  . KNEE ARTHROSCOPY Right    2006 By Dr. Luna Glasgow at [facility].   . THYROID SURGERY    . VIDEO BRONCHOSCOPY WITH ENDOBRONCHIAL ULTRASOUND N/A 01/30/2020   Procedure: VIDEO BRONCHOSCOPY WITH ENDOBRONCHIAL ULTRASOUND;  Surgeon: Melrose Nakayama, MD;  Location[facility];  Service: Thoracic;  Laterality: N/A;    REVIEW OF SYSTEMS:  Constitutional: positive for fatigue Eyes: negative Ears, nose, mouth, throat, and face: negative Respiratory: positive for cough Cardiovascular:  negative Gastrointestinal: negative Genitourinary:negative Integument/breast: negative Hematologic/lymphatic: negative Musculoskeletal:negative Neurological: negative Behavioral/Psych: negative Endocrine: negative Allergic/Immunologic: negative   PHYSICAL EXAMINATION: General appearance: alert, cooperative and no distress Head: Normocephalic, without obvious abnormality, atraumatic Neck: no adenopathy, no JVD, supple, symmetrical, trachea midline and thyroid not enlarged, symmetric, no tenderness/mass/nodules Lymph nodes: Cervical, supraclavicular, and axillary nodes normal. Resp: clear to auscultation bilaterally Back: symmetric, no curvature. ROM normal. No CVA tenderness. Cardio: regular rate and rhythm, S1, S2 normal, no murmur, click, rub or gallop GI: soft, non-tender; bowel sounds normal; no masses,  no organomegaly Extremities: extremities normal, atraumatic, no cyanosis or edema Neurologic: Alert and oriented X 3, normal strength and tone. Normal symmetric reflexes. Normal coordination and gait  ECOG PERFORMANCE STATUS: 1 - Symptomatic but completely ambulatory  Blood pressure (!) 146/77, pulse 96, temperature 98.6 F (37 C), temperature source Tympanic, resp. rate 18, height 5\' 9"  (1.753 m), weight 264 lb (119.7 kg), SpO2 95 %.  LABORATORY DATA: Lab Results  Component Value Date   WBC 6.5 05/01/2020   HGB 9.9 (L) 05/01/2020   HCT 30.6 (L) 05/01/2020   MCV 87.9 05/01/2020   PLT 204 05/01/2020      Chemistry      Component Value Date/Time   NA 140 05/01/2020 1214   K 4.1 05/01/2020 1214   CL 106 05/01/2020 1214   CO2 28 05/01/2020 1214   BUN 12 05/01/2020 1214   CREATININE 0.79 05/01/2020 1214      Component Value Date/Time   CALCIUM 8.7 (L) 05/01/2020 1214   ALKPHOS 72 05/01/2020 1214   AST 13 (L) 05/01/2020 1214   ALT 12 05/01/2020 1214   BILITOT 0.6 05/01/2020 1214       RADIOGRAPHIC STUDIES: CT Chest W Contrast  Result Date: 05/01/2020 CLINICAL  DATA:  Non-small cell lung cancer. Radiation therapy completed 1 month ago. EXAM: CT CHEST WITH CONTRAST TECHNIQUE: Multidetector CT imaging of the chest was performed during intravenous contrast administration. CONTRAST:  15mL OMNIPAQUE IOHEXOL 300 MG/ML  SOLN COMPARISON:  PET-CT 02/06/2020.  Chest CT 01/09/2020 and 03/15/2007. FINDINGS: Cardiovascular: No acute vascular findings are seen, although the contrast bolus is suboptimal. There is a right IJ Port-A-Cath with its tip in the superior right atrium. Mild aortic atherosclerosis. There is mild central enlargement of the pulmonary arteries, suggesting pulmonary arterial hypertension. The heart size is normal. There is no pericardial effusion. Mediastinum/Nodes: There are no enlarged mediastinal, hilar or axillary lymph nodes.Stable tracheal deviation to the right status post right thyroidectomy. The left thyroid lobe, trachea and esophagus otherwise appear unremarkable. Lungs/Pleura: No pleural effusion or pneumothorax. Interval significant cavitation of the previously demonstrated hypermetabolic right upper lobe mass. The remaining cavitary lesion measures approximately 8.3 x 5.5 cm on image 39/7 and has significantly smaller soft tissue components compared with previous imaging. There is mild right upper lobe bronchial  narrowing. Stable mild subpleural reticulation and linear scarring in both lungs. No new or enlarging pulmonary nodules. Upper abdomen: The visualized upper abdomen appears stable without significant findings. There is a stable low-density probable cyst in the upper pole of the right kidney and mild nodularity of both adrenal glands. Musculoskeletal/Chest wall: There is no chest wall mass or suspicious osseous finding. IMPRESSION: 1. Interval decreased size and significant central cavitation of the previously demonstrated hypermetabolic right upper lobe mass consistent with response to therapy. 2. No evidence of local recurrence or metastatic  disease. 3. Central enlargement of the pulmonary arteries, suggesting pulmonary arterial hypertension. 4. Aortic Atherosclerosis (ICD10-I70.0). Electronically Signed   By: Richardean Sale M.D.   On: 05/01/2020 15:03    ASSESSMENT AND PLAN: This is a very pleasant 75 years old African-American male recently diagnosed with a stage IIIA non-small cell lung cancer, squamous cell carcinoma presented with large right upper lobe lung mass in October 2021. The patient underwent a course of concurrent chemoradiation with weekly carboplatin and paclitaxel status post 6 cycles.  The patient tolerated this course of treatment fairly well except for mild odynophagia. He had repeat CT scan of the chest performed recently.  I personally and independently reviewed the scan images and discussed the results with the patient today. His scan showed interval decrease in the size and significant central cavitation of the previously demonstrated right upper lobe lung mass consistent with response to therapy. I discussed with the patient options for treatment of his condition including observation versus proceeding with consolidation treatment with immunotherapy with Imfinzi 1500 mg IV every 4 weeks.  The patient is interested in treatment.  I discussed with him the adverse effect of this treatment including but not limited to immunotherapy mediated skin rash, diarrhea, inflammation of the lung, kidney, liver, thyroid or other endocrine dysfunction. Is expected to start the first cycle of this treatment next week. The patient will come back for follow-up visit in 5 weeks for the start of cycle #2. He was advised to call immediately if he has any other concerning symptoms in the interval.  The patient voices understanding of current disease status and treatment options and is in agreement with the current care plan.  All questions were answered. The patient knows to call the clinic with any problems, questions or concerns. We  can certainly see the patient much sooner if necessary.   Disclaimer: This note was dictated with voice recognition software. Similar sounding words can inadvertently be transcribed and may not be corrected upon review.

## 2020-05-03 NOTE — Progress Notes (Signed)
DISCONTINUE ON PATHWAY REGIMEN - Non-Small Cell Lung     Administer weekly:     Paclitaxel      Carboplatin   **Always confirm dose/schedule in your pharmacy ordering system**  REASON: Continuation Of Treatment PRIOR TREATMENT: [id number]: Carboplatin AUC=2 + Paclitaxel 45 mg/m2 Weekly During Radiation TREATMENT RESPONSE: Partial Response (PR)  START ON PATHWAY REGIMEN - Non-Small Cell Lung     A cycle is every 28 days:     Durvalumab   **Always confirm dose/schedule in your pharmacy ordering system**  Patient Characteristics: Preoperative or Nonsurgical Candidate (Clinical Staging), Stage III - Nonsurgical Candidate (Nonsquamous and Squamous), PS = 0, 1 Therapeutic Status: Preoperative or Nonsurgical Candidate (Clinical Staging) AJCC T Category: cT4 AJCC N Category: cN0 AJCC M Category: cM0 AJCC 8 Stage Grouping: IIIA ECOG Performance Status: 1 Intent of Therapy: Curative Intent, Discussed with Patient

## 2020-05-07 ENCOUNTER — Telehealth: Payer: Self-pay | Admitting: Medical Oncology

## 2020-05-07 ENCOUNTER — Telehealth: Payer: Self-pay | Admitting: Internal Medicine

## 2020-05-07 NOTE — Telephone Encounter (Signed)
Orley called and asked for the dates of his new Imfinzi Infusion appts. He said they are supposed to start this week. I told Icholas that scheduling is working on his appts.

## 2020-05-07 NOTE — Telephone Encounter (Signed)
Scheduled per 1/6 los. Called and spoke with pt, confirmed 1/14 appts

## 2020-05-10 NOTE — Progress Notes (Signed)
Pharmacist Chemotherapy Monitoring - Initial Assessment    Anticipated start date: 05/11/20   Regimen:  . Are orders appropriate based on the patient's diagnosis, regimen, and cycle? Yes . Does the plan date match the patient's scheduled date? Yes . Is the sequencing of drugs appropriate? Yes . Are the premedications appropriate for the patient's regimen? Yes . Prior Authorization for treatment is: Approved o If applicable, is the correct biosimilar selected based on the patient's insurance? not applicable  Organ Function and Labs: Marland Kitchen Are dose adjustments needed based on the patient's renal function, hepatic function, or hematologic function? Yes . Are appropriate labs ordered prior to the start of patient's treatment? Yes . Other organ system assessment, if indicated: N/A . The following baseline labs, if indicated, have been ordered: durvalumab: baseline TSH +/- T4  Dose Assessment: . Are the drug doses appropriate? Yes . Are the following correct: o Drug concentrations Yes o IV fluid compatible with drug Yes o Administration routes Yes o Timing of therapy Yes . If applicable, does the patient have documented access for treatment and/or plans for port-a-cath placement? yes . If applicable, have lifetime cumulative doses been properly documented and assessed? yes Lifetime Dose Tracking  . Carboplatin: 1,560 mg = 0.01 % of the maximum lifetime dose of 999,999,999 mg  o   Toxicity Monitoring/Prevention: . The patient has the following take home antiemetics prescribed: Prochlorperazine . The patient has the following take home medications prescribed: N/A . Medication allergies and previous infusion related reactions, if applicable, have been reviewed and addressed. Yes . The patient's current medication list has been assessed for drug-drug interactions with their chemotherapy regimen. no significant drug-drug interactions were identified on review.  Order Review: . Are the treatment  plan orders signed? Yes . Is the patient scheduled to see a provider prior to their treatment? No  I verify that I have reviewed each item in the above checklist and answered each question accordingly.   Kennith Center, Pharm.D., CPP 05/10/2020@1 :37 PM

## 2020-05-11 ENCOUNTER — Other Ambulatory Visit: Payer: Self-pay

## 2020-05-11 ENCOUNTER — Inpatient Hospital Stay: Payer: Medicare Other

## 2020-05-11 VITALS — BP 115/62 | HR 77 | Temp 98.2°F | Resp 16 | Wt 265.0 lb

## 2020-05-11 DIAGNOSIS — C3411 Malignant neoplasm of upper lobe, right bronchus or lung: Secondary | ICD-10-CM

## 2020-05-11 DIAGNOSIS — Z5112 Encounter for antineoplastic immunotherapy: Secondary | ICD-10-CM | POA: Diagnosis not present

## 2020-05-11 DIAGNOSIS — Z79899 Other long term (current) drug therapy: Secondary | ICD-10-CM | POA: Diagnosis not present

## 2020-05-11 DIAGNOSIS — R1310 Dysphagia, unspecified: Secondary | ICD-10-CM | POA: Diagnosis not present

## 2020-05-11 DIAGNOSIS — I700 Atherosclerosis of aorta: Secondary | ICD-10-CM | POA: Diagnosis not present

## 2020-05-11 DIAGNOSIS — R0602 Shortness of breath: Secondary | ICD-10-CM | POA: Diagnosis not present

## 2020-05-11 DIAGNOSIS — R059 Cough, unspecified: Secondary | ICD-10-CM | POA: Diagnosis not present

## 2020-05-11 DIAGNOSIS — R5383 Other fatigue: Secondary | ICD-10-CM | POA: Diagnosis not present

## 2020-05-11 LAB — CBC WITH DIFFERENTIAL (CANCER CENTER ONLY)
Abs Immature Granulocytes: 0.05 10*3/uL (ref 0.00–0.07)
Basophils Absolute: 0.1 10*3/uL (ref 0.0–0.1)
Basophils Relative: 1 %
Eosinophils Absolute: 0.1 10*3/uL (ref 0.0–0.5)
Eosinophils Relative: 2 %
HCT: 34 % — ABNORMAL LOW (ref 39.0–52.0)
Hemoglobin: 10.8 g/dL — ABNORMAL LOW (ref 13.0–17.0)
Immature Granulocytes: 1 %
Lymphocytes Relative: 15 %
Lymphs Abs: 1.1 10*3/uL (ref 0.7–4.0)
MCH: 27.4 pg (ref 26.0–34.0)
MCHC: 31.8 g/dL (ref 30.0–36.0)
MCV: 86.3 fL (ref 80.0–100.0)
Monocytes Absolute: 1.2 10*3/uL — ABNORMAL HIGH (ref 0.1–1.0)
Monocytes Relative: 17 %
Neutro Abs: 4.6 10*3/uL (ref 1.7–7.7)
Neutrophils Relative %: 64 %
Platelet Count: 236 10*3/uL (ref 150–400)
RBC: 3.94 MIL/uL — ABNORMAL LOW (ref 4.22–5.81)
RDW: 19.7 % — ABNORMAL HIGH (ref 11.5–15.5)
WBC Count: 7 10*3/uL (ref 4.0–10.5)
nRBC: 0 % (ref 0.0–0.2)

## 2020-05-11 LAB — CMP (CANCER CENTER ONLY)
ALT: 15 U/L (ref 0–44)
AST: 15 U/L (ref 15–41)
Albumin: 3.1 g/dL — ABNORMAL LOW (ref 3.5–5.0)
Alkaline Phosphatase: 98 U/L (ref 38–126)
Anion gap: 10 (ref 5–15)
BUN: 18 mg/dL (ref 8–23)
CO2: 25 mmol/L (ref 22–32)
Calcium: 9.4 mg/dL (ref 8.9–10.3)
Chloride: 104 mmol/L (ref 98–111)
Creatinine: 0.96 mg/dL (ref 0.61–1.24)
GFR, Estimated: >60 mL/min (ref >60)
Glucose, Bld: 149 mg/dL — ABNORMAL HIGH (ref 70–99)
Potassium: 4.4 mmol/L (ref 3.5–5.1)
Sodium: 139 mmol/L (ref 135–145)
Total Bilirubin: 0.5 mg/dL (ref 0.3–1.2)
Total Protein: 7.3 g/dL (ref 6.5–8.1)

## 2020-05-11 LAB — TSH: TSH: 1.972 u[IU]/mL (ref 0.320–4.118)

## 2020-05-11 MED ORDER — SODIUM CHLORIDE 0.9% FLUSH
10.0000 mL | INTRAVENOUS | Status: DC | PRN
  Administered 2020-05-11: 10 mL
  Filled 2020-05-11: qty 10

## 2020-05-11 MED ORDER — DURVALUMAB 500 MG/10ML IV SOLN
1500.0000 mg | Freq: Once | INTRAVENOUS | Status: AC
Start: 2020-05-11 — End: 2020-05-11
  Administered 2020-05-11: 1500 mg via INTRAVENOUS
  Filled 2020-05-11: qty 30

## 2020-05-11 MED ORDER — SODIUM CHLORIDE 0.9 % IV SOLN
Freq: Once | INTRAVENOUS | Status: AC
  Filled 2020-05-11: qty 250

## 2020-05-11 MED ORDER — HEPARIN SOD (PORK) LOCK FLUSH 100 UNIT/ML IV SOLN
500.0000 [IU] | Freq: Once | INTRAVENOUS | Status: AC | PRN
  Administered 2020-05-11: 500 [IU]
  Filled 2020-05-11: qty 5

## 2020-05-11 NOTE — Patient Instructions (Signed)
[facility] Discharge Instructions for Patients Receiving Chemotherapy  Today you received the following chemotherapy agents durvalumab (Imfinzi).   To help prevent nausea and vomiting after your treatment, we encourage you to take your nausea medication as directed.   If you develop nausea and vomiting that is not controlled by your nausea medication, call the clinic.   BELOW ARE SYMPTOMS THAT SHOULD BE REPORTED IMMEDIATELY:  *FEVER GREATER THAN 100.5 F  *CHILLS WITH OR WITHOUT FEVER  NAUSEA AND VOMITING THAT IS NOT CONTROLLED WITH YOUR NAUSEA MEDICATION  *UNUSUAL SHORTNESS OF BREATH  *UNUSUAL BRUISING OR BLEEDING  TENDERNESS IN MOUTH AND THROAT WITH OR WITHOUT PRESENCE OF ULCERS  *URINARY PROBLEMS  *BOWEL PROBLEMS  UNUSUAL RASH Items with * indicate a potential emergency and should be followed up as soon as possible.  Feel free to call the clinic should you have any questions or concerns. The clinic phone number is [phone].  Please show the [facility] at check-in to the Emergency Department and triage nurse.  Durvalumab injection What is this medicine? DURVALUMAB (dur VAL ue mab) is a monoclonal antibody. It is used to treat lung cancer. This medicine may be used for other purposes; ask your health care provider or pharmacist if you have questions. COMMON BRAND NAME(S): IMFINZI What should I tell my health care provider before I take this medicine? They need to know if you have any of these conditions:  autoimmune diseases like Crohn's disease, ulcerative colitis, or lupus  have had or planning to have an allogeneic stem cell transplant (uses someone else's stem cells)  history of organ transplant  history of radiation to the chest  nervous system problems like myasthenia gravis or Guillain-Barre syndrome  an unusual or allergic reaction to durvalumab, other medicines, foods, dyes, or preservatives  pregnant or trying to get  pregnant  breast-feeding How should I use this medicine? This medicine is for infusion into a vein. It is given by a health care professional in a hospital or clinic setting. A special MedGuide will be given to you before each treatment. Be sure to read this information carefully each time. Talk to your pediatrician regarding the use of this medicine in children. Special care may be needed. Overdosage: If you think you have taken too much of this medicine contact a poison control center or emergency room at once. NOTE: This medicine is only for you. Do not share this medicine with others. What if I miss a dose? It is important not to miss your dose. Call your doctor or health care professional if you are unable to keep an appointment. What may interact with this medicine? Interactions have not been studied. This list may not describe all possible interactions. Give your health care provider a list of all the medicines, herbs, non-prescription drugs, or dietary supplements you use. Also tell them if you smoke, drink alcohol, or use illegal drugs. Some items may interact with your medicine. What should I watch for while using this medicine? This drug may make you feel generally unwell. Continue your course of treatment even though you feel ill unless your doctor tells you to stop. You may need blood work done while you are taking this medicine. Do not become pregnant while taking this medicine or for 3 months after stopping it. Women should inform their doctor if they wish to become pregnant or think they might be pregnant. There is a potential for serious side effects to an unborn child. Talk to  your health care professional or pharmacist for more information. Do not breast-feed an infant while taking this medicine or for 3 months after stopping it. What side effects may I notice from receiving this medicine? Side effects that you should report to your doctor or health care professional as soon as  possible:  allergic reactions like skin rash, itching or hives, swelling of the face, lips, or tongue  black, tarry stools  bloody or watery diarrhea  breathing problems  change in emotions or moods  change in sex drive  changes in vision  chest pain or chest tightness  chills  confusion  cough  facial flushing  fever  headache  signs and symptoms of high blood sugar such as dizziness; dry mouth; dry skin; fruity breath; nausea; stomach pain; increased hunger or thirst; increased urination  signs and symptoms of liver injury like dark yellow or brown urine; general ill feeling or flu-like symptoms; light-colored stools; loss of appetite; nausea; right upper belly pain; unusually weak or tired; yellowing of the eyes or skin  stomach pain  trouble passing urine or change in the amount of urine  weight gain or weight loss Side effects that usually do not require medical attention (report these to your doctor or health care professional if they continue or are bothersome):  bone pain  constipation  loss of appetite  muscle pain  nausea  swelling of the ankles, feet, hands  tiredness This list may not describe all possible side effects. Call your doctor for medical advice about side effects. You may report side effects to FDA at 1-800-FDA-1088. Where should I keep my medicine? This drug is given in a hospital or clinic and will not be stored at home. NOTE: This sheet is a summary. It may not cover all possible information. If you have questions about this medicine, talk to your doctor, pharmacist, or health care provider.  2021 Elsevier/Gold Standard (2019-06-23 13:01:29)

## 2020-05-14 ENCOUNTER — Telehealth: Payer: Self-pay | Admitting: *Deleted

## 2020-05-14 ENCOUNTER — Telehealth: Payer: Self-pay | Admitting: Radiation Oncology

## 2020-05-14 NOTE — Telephone Encounter (Signed)
  Radiation Oncology         [phone] ________________________________  Name: Billy Hall MRN: [id number]  Date of Service: 05/14/2020  DOB: 01-24-1946  Post Treatment Telephone Note  Diagnosis:  Stage IIIB, cT4N2M0, NSCLC, squamous cell carcinoma of the RUL  Interval Since Last Radiation:  6 weeks   02/20/20-04/05/20: The right lung was treated to 60 Gy in 30 fractions followed by a 6 Gy boost in 3 fractions.  Narrative:  The patient was contacted today for routine follow-up. During treatment he did very well with radiotherapy and did not have significant desquamation. He reports he is doing better with eating at this time and denies any irritation at this time but is still taking carafate.  Impression/Plan: 1. Stage IIIB, cT4N2M0, NSCLC, squamous cell carcinoma of the RUL. The patient has been doing well since completion of radiotherapy. We discussed that we would be happy to continue to follow him as needed, but he will also continue to follow up with Dr. Julien Nordmann in medical oncology.     Carola Rhine, PAC

## 2020-05-14 NOTE — Telephone Encounter (Signed)
Called to see how pt did with his treatment & spoke with family member who states pt has gone to pharmacy but did well with treatment. She reports that he has SOB but not new.  Informed to call us if this is worse or has any other symptoms.  She expressed understanding.

## 2020-05-14 NOTE — Telephone Encounter (Signed)
-----   Message from Isabel Caprice, RN sent at 05/11/2020 11:10 AM EST ----- Regarding: Dr. Julien Nordmann, first time durvalumab follow up. First time durvalumab given on 05/11/20. Please follow up, thank you!!

## 2020-05-22 ENCOUNTER — Other Ambulatory Visit: Payer: Self-pay | Admitting: Radiation Oncology

## 2020-05-22 DIAGNOSIS — C3411 Malignant neoplasm of upper lobe, right bronchus or lung: Secondary | ICD-10-CM | POA: Diagnosis not present

## 2020-05-22 DIAGNOSIS — E039 Hypothyroidism, unspecified: Secondary | ICD-10-CM | POA: Diagnosis not present

## 2020-05-22 DIAGNOSIS — Z0000 Encounter for general adult medical examination without abnormal findings: Secondary | ICD-10-CM | POA: Diagnosis not present

## 2020-05-22 DIAGNOSIS — E118 Type 2 diabetes mellitus with unspecified complications: Secondary | ICD-10-CM | POA: Diagnosis not present

## 2020-05-23 NOTE — Progress Notes (Signed)
  Radiation Oncology         [phone] ________________________________  Name: Billy Hall MRN: [id number]  Date: 04/05/2020  DOB: 02-Nov-1945  End of Treatment Note  Diagnosis:  Lung cancer     Indication for treatment::  curative       Radiation treatment dates:   02/20/2020 through 04/05/2020  Site/dose:   The patient was treated to the disease within the right lung initially to a dose of 60 Gy using a 5 field, 3-D conformal technique. The patient then received a cone down boost treatment for an additional 6 Gy. This yielded a final total dose of 66 Gy.   Narrative: The patient tolerated radiation treatment relatively well.      Plan: The patient has completed radiation treatment. The patient will return to radiation oncology clinic for routine followup in one month. I advised the patient to call or return sooner if they have any questions or concerns related to their recovery or treatment. ________________________________  Jodelle Gross, M.D., Ph.D.

## 2020-06-07 ENCOUNTER — Inpatient Hospital Stay: Payer: Medicare Other

## 2020-06-07 ENCOUNTER — Inpatient Hospital Stay: Payer: Medicare Other | Admitting: Internal Medicine

## 2020-06-07 ENCOUNTER — Encounter: Payer: Self-pay | Admitting: Internal Medicine

## 2020-06-07 ENCOUNTER — Inpatient Hospital Stay: Payer: Medicare Other | Attending: Internal Medicine

## 2020-06-07 ENCOUNTER — Other Ambulatory Visit: Payer: Self-pay

## 2020-06-07 VITALS — BP 137/73 | HR 87 | Temp 97.0°F | Resp 18 | Ht 69.0 in | Wt 267.8 lb

## 2020-06-07 DIAGNOSIS — C3411 Malignant neoplasm of upper lobe, right bronchus or lung: Secondary | ICD-10-CM

## 2020-06-07 DIAGNOSIS — Z923 Personal history of irradiation: Secondary | ICD-10-CM | POA: Diagnosis not present

## 2020-06-07 DIAGNOSIS — M1990 Unspecified osteoarthritis, unspecified site: Secondary | ICD-10-CM | POA: Diagnosis not present

## 2020-06-07 DIAGNOSIS — R1310 Dysphagia, unspecified: Secondary | ICD-10-CM | POA: Diagnosis not present

## 2020-06-07 DIAGNOSIS — Z95828 Presence of other vascular implants and grafts: Secondary | ICD-10-CM

## 2020-06-07 DIAGNOSIS — Z5112 Encounter for antineoplastic immunotherapy: Secondary | ICD-10-CM | POA: Diagnosis not present

## 2020-06-07 DIAGNOSIS — Z79899 Other long term (current) drug therapy: Secondary | ICD-10-CM | POA: Insufficient documentation

## 2020-06-07 LAB — CMP (CANCER CENTER ONLY)
ALT: 8 U/L (ref 0–44)
AST: 11 U/L — ABNORMAL LOW (ref 15–41)
Albumin: 3.2 g/dL — ABNORMAL LOW (ref 3.5–5.0)
Alkaline Phosphatase: 80 U/L (ref 38–126)
Anion gap: 8 (ref 5–15)
BUN: 14 mg/dL (ref 8–23)
CO2: 25 mmol/L (ref 22–32)
Calcium: 8.8 mg/dL — ABNORMAL LOW (ref 8.9–10.3)
Chloride: 106 mmol/L (ref 98–111)
Creatinine: 0.77 mg/dL (ref 0.61–1.24)
GFR, Estimated: >60 mL/min (ref >60)
Glucose, Bld: 121 mg/dL — ABNORMAL HIGH (ref 70–99)
Potassium: 4 mmol/L (ref 3.5–5.1)
Sodium: 139 mmol/L (ref 135–145)
Total Bilirubin: 0.4 mg/dL (ref 0.3–1.2)
Total Protein: 6.9 g/dL (ref 6.5–8.1)

## 2020-06-07 LAB — CBC WITH DIFFERENTIAL (CANCER CENTER ONLY)
Abs Immature Granulocytes: 0.02 10*3/uL (ref 0.00–0.07)
Basophils Absolute: 0 10*3/uL (ref 0.0–0.1)
Basophils Relative: 1 %
Eosinophils Absolute: 0.2 10*3/uL (ref 0.0–0.5)
Eosinophils Relative: 3 %
HCT: 36.2 % — ABNORMAL LOW (ref 39.0–52.0)
Hemoglobin: 11.5 g/dL — ABNORMAL LOW (ref 13.0–17.0)
Immature Granulocytes: 0 %
Lymphocytes Relative: 14 %
Lymphs Abs: 0.8 10*3/uL (ref 0.7–4.0)
MCH: 26.6 pg (ref 26.0–34.0)
MCHC: 31.8 g/dL (ref 30.0–36.0)
MCV: 83.8 fL (ref 80.0–100.0)
Monocytes Absolute: 0.7 10*3/uL (ref 0.1–1.0)
Monocytes Relative: 12 %
Neutro Abs: 4.2 10*3/uL (ref 1.7–7.7)
Neutrophils Relative %: 70 %
Platelet Count: 196 10*3/uL (ref 150–400)
RBC: 4.32 MIL/uL (ref 4.22–5.81)
RDW: 17.7 % — ABNORMAL HIGH (ref 11.5–15.5)
WBC Count: 5.9 10*3/uL (ref 4.0–10.5)
nRBC: 0 % (ref 0.0–0.2)

## 2020-06-07 LAB — TSH: TSH: 2.012 u[IU]/mL (ref 0.320–4.118)

## 2020-06-07 MED ORDER — HEPARIN SOD (PORK) LOCK FLUSH 100 UNIT/ML IV SOLN
500.0000 [IU] | Freq: Once | INTRAVENOUS | Status: AC | PRN
  Administered 2020-06-07: 500 [IU]
  Filled 2020-06-07: qty 5

## 2020-06-07 MED ORDER — SODIUM CHLORIDE 0.9% FLUSH
10.0000 mL | Freq: Once | INTRAVENOUS | Status: AC
  Administered 2020-06-07: 10 mL
  Filled 2020-06-07: qty 10

## 2020-06-07 MED ORDER — SODIUM CHLORIDE 0.9 % IV SOLN
1500.0000 mg | Freq: Once | INTRAVENOUS | Status: AC
  Administered 2020-06-07: 1500 mg via INTRAVENOUS
  Filled 2020-06-07: qty 30

## 2020-06-07 MED ORDER — SODIUM CHLORIDE 0.9 % IV SOLN
Freq: Once | INTRAVENOUS | Status: AC
  Filled 2020-06-07: qty 250

## 2020-06-07 MED ORDER — SODIUM CHLORIDE 0.9% FLUSH
10.0000 mL | INTRAVENOUS | Status: DC | PRN
Start: 2020-06-07 — End: 2020-06-07
  Administered 2020-06-07: 10 mL
  Filled 2020-06-07: qty 10

## 2020-06-07 NOTE — Progress Notes (Signed)
[facility] [phone]   [phone]  OFFICE PROGRESS NOTE  [name], MD [place] [address]  DIAGNOSIS: Stage IIIa (T4, N0, M0) non-small cell lung cancer, squamous cell carcinoma presented with large right upper lobe lung mass diagnosed in October 2021.  PRIOR THERAPY: Concurrent chemoradiation with weekly carboplatin for AUC of 2 and paclitaxel 45 mg/M2. Status post 6 cycles. First dose on 02/27/20.  Last dose was given April 02, 2020.  CURRENT THERAPY:  Consolidation treatment with immunotherapy with Imfinzi 1500 mg IV every 4 weeks.  First dose May 10, 2020.  Status post 1 cycle.  INTERVAL HISTORY: Billy Hall 75 y.o. male returns to the clinic today for follow-up visit.  The patient is feeling fine today with no concerning complaints.  He denied having any current chest pain, shortness of breath, cough or hemoptysis.  He denied having any fever or chills.  He has no nausea, vomiting, diarrhea or constipation.  He denied having any headache or visual changes.  He tolerated the first cycle of his treatment with Imfinzi fairly well.  The patient is here today for evaluation before starting cycle #2.   MEDICAL HISTORY: Past Medical History:  Diagnosis Date  . Arthritis   . Depressive disorder   . Diabetes mellitus without complication ([place])   . Fatigue   . Headache(784.0)   . Hyperlipidemia   . Hypertension   . Hypothyroidism   . Snoring     ALLERGIES:  is allergic to ace inhibitors.  MEDICATIONS:  Current Outpatient Medications  Medication Sig Dispense Refill  . acetaminophen (TYLENOL 8 HOUR) 650 MG CR tablet Take 650 mg by mouth every 8 (eight) hours as needed for pain.    Marland Kitchen aspirin EC 81 MG tablet Take 81 mg by mouth daily.    . cholecalciferol (VITAMIN D3) 25 MCG (1000 UNIT) tablet Take 1,000 Units by mouth daily.    Marland Kitchen levothyroxine (SYNTHROID) 88 MCG tablet Take 88 mcg by mouth daily  before breakfast.     . losartan (COZAAR) 50 MG tablet Take 50 mg by mouth daily.     . metFORMIN (GLUCOPHAGE) 500 MG tablet Take 500 mg by mouth 2 (two) times daily with a meal.     . ONETOUCH ULTRA test strip 1 each 2 (two) times daily.    . prochlorperazine (COMPAZINE) 10 MG tablet Take 1 tablet (10 mg total) by mouth every 6 (six) hours as needed for nausea or vomiting. 30 tablet 0  . sildenafil (REVATIO) 20 MG tablet Take 20 mg by mouth daily as needed (ED).     . simvastatin (ZOCOR) 10 MG tablet Take 10 mg by mouth every evening.     . sucralfate (CARAFATE) 1 g tablet CRUSH (1) TABLET IN 1oz OF WATER AND DRINK 5 MINUTES BEFORE MEALS AND AT BEDTIME 4 TIMES DAILY 120 tablet 0  . tamsulosin (FLOMAX) 0.4 MG CAPS capsule Take 0.4 mg by mouth daily.     No current facility-administered medications for this visit.    SURGICAL HISTORY:  Past Surgical History:  Procedure Laterality Date  . COLONOSCOPY N/A 03/08/2013   Procedure: COLONOSCOPY;  Surgeon: Jamesetta So, MD;  [facility];  Service: Gastroenterology;  Laterality: N/A;  . IR IMAGING GUIDED PORT INSERTION  03/08/2020  . IR US GUIDE BX ASP/DRAIN  03/08/2020  . KNEE ARTHROSCOPY Right    2006 By Dr. Luna Glasgow at [facility].   . THYROID  SURGERY    . VIDEO BRONCHOSCOPY WITH ENDOBRONCHIAL ULTRASOUND N/A 01/30/2020   Procedure: VIDEO BRONCHOSCOPY WITH ENDOBRONCHIAL ULTRASOUND;  Surgeon: Melrose Nakayama, MD;  Location: [facility] OR;  Service: Thoracic;  Laterality: N/A;    REVIEW OF SYSTEMS:  A comprehensive review of systems was negative.   PHYSICAL EXAMINATION: General appearance: alert, cooperative and no distress Head: Normocephalic, without obvious abnormality, atraumatic Neck: no adenopathy, no JVD, supple, symmetrical, trachea midline and thyroid not enlarged, symmetric, no tenderness/mass/nodules Lymph nodes: Cervical, supraclavicular, and axillary nodes normal. Resp: clear to auscultation bilaterally Back: symmetric, no  curvature. ROM normal. No CVA tenderness. Cardio: regular rate and rhythm, S1, S2 normal, no murmur, click, rub or gallop GI: soft, non-tender; bowel sounds normal; no masses,  no organomegaly Extremities: extremities normal, atraumatic, no cyanosis or edema  ECOG PERFORMANCE STATUS: 1 - Symptomatic but completely ambulatory  Blood pressure 137/73, pulse 87, temperature (!) 97 F (36.1 C), temperature source Tympanic, resp. rate 18, height 5\' 9"  (1.753 m), weight 267 lb 12.8 oz (121.5 kg), SpO2 96 %.  LABORATORY DATA: Lab Results  Component Value Date   WBC 5.9 06/07/2020   HGB 11.5 (L) 06/07/2020   HCT 36.2 (L) 06/07/2020   MCV 83.8 06/07/2020   PLT 196 06/07/2020      Chemistry      Component Value Date/Time   NA 139 05/11/2020 0742   K 4.4 05/11/2020 0742   CL 104 05/11/2020 0742   CO2 25 05/11/2020 0742   BUN 18 05/11/2020 0742   CREATININE 0.96 05/11/2020 0742      Component Value Date/Time   CALCIUM 9.4 05/11/2020 0742   ALKPHOS 98 05/11/2020 0742   AST 15 05/11/2020 0742   ALT 15 05/11/2020 0742   BILITOT 0.5 05/11/2020 0742       RADIOGRAPHIC STUDIES: No results found.  ASSESSMENT AND PLAN: This is a very pleasant 75 years old African-American male recently diagnosed with a stage IIIA non-small cell lung cancer, squamous cell carcinoma presented with large right upper lobe lung mass in October 2021. The patient underwent a course of concurrent chemoradiation with weekly carboplatin and paclitaxel status post 6 cycles.  The patient tolerated this course of treatment fairly well except for mild odynophagia. Imaging studies after the induction phase of concurrent chemoradiation showed interval decrease in the size and significant central cavitation of the previously demonstrated right upper lobe lung mass consistent with response to therapy. The patient is currently undergoing consolidation treatment with immunotherapy with Imfinzi 1500 mg IV every 4 weeks status  post 1 cycle.  He tolerated the first cycle of his treatment well with no concerning adverse effects. I recommended for him to proceed with cycle #2 today as planned. The patient will come back for follow-up visit in 4 weeks for evaluation before starting cycle #3. He was advised to call immediately if he has any other concerning symptoms in the interval. The patient voices understanding of current disease status and treatment options and is in agreement with the current care plan.  All questions were answered. The patient knows to call the clinic with any problems, questions or concerns. We can certainly see the patient much sooner if necessary.   Disclaimer: This note was dictated with voice recognition software. Similar sounding words can inadvertently be transcribed and may not be corrected upon review.

## 2020-06-07 NOTE — Patient Instructions (Signed)

## 2020-06-07 NOTE — Patient Instructions (Signed)
[facility] Discharge Instructions for Patients Receiving Chemotherapy  Today you received the following chemotherapy agents Durvulaumab  To help prevent nausea and vomiting after your treatment, we encourage you to take your nausea medication as directed.  If you develop nausea and vomiting that is not controlled by your nausea medication, call the clinic.   BELOW ARE SYMPTOMS THAT SHOULD BE REPORTED IMMEDIATELY:  *FEVER GREATER THAN 100.5 F  *CHILLS WITH OR WITHOUT FEVER  NAUSEA AND VOMITING THAT IS NOT CONTROLLED WITH YOUR NAUSEA MEDICATION  *UNUSUAL SHORTNESS OF BREATH  *UNUSUAL BRUISING OR BLEEDING  TENDERNESS IN MOUTH AND THROAT WITH OR WITHOUT PRESENCE OF ULCERS  *URINARY PROBLEMS  *BOWEL PROBLEMS  UNUSUAL RASH Items with * indicate a potential emergency and should be followed up as soon as possible.  Feel free to call the clinic should you have any questions or concerns. The clinic phone number is [phone].  Please show the [facility] at check-in to the Emergency Department and triage nurse.

## 2020-06-11 ENCOUNTER — Telehealth: Payer: Self-pay | Admitting: Internal Medicine

## 2020-06-11 NOTE — Telephone Encounter (Signed)
Scheduled 2/10 los. Pt will receive an updated appt calendar at next visit per appt notes

## 2020-07-05 ENCOUNTER — Inpatient Hospital Stay: Payer: Medicare Other

## 2020-07-05 ENCOUNTER — Inpatient Hospital Stay: Payer: Medicare Other | Admitting: Internal Medicine

## 2020-07-05 ENCOUNTER — Other Ambulatory Visit: Payer: Self-pay

## 2020-07-05 ENCOUNTER — Inpatient Hospital Stay: Payer: Medicare Other | Attending: Internal Medicine

## 2020-07-05 VITALS — BP 143/76 | HR 84 | Temp 96.1°F | Resp 12 | Ht 69.0 in | Wt 270.0 lb

## 2020-07-05 DIAGNOSIS — Z923 Personal history of irradiation: Secondary | ICD-10-CM | POA: Insufficient documentation

## 2020-07-05 DIAGNOSIS — C3490 Malignant neoplasm of unspecified part of unspecified bronchus or lung: Secondary | ICD-10-CM | POA: Diagnosis not present

## 2020-07-05 DIAGNOSIS — Z5112 Encounter for antineoplastic immunotherapy: Secondary | ICD-10-CM | POA: Insufficient documentation

## 2020-07-05 DIAGNOSIS — C3411 Malignant neoplasm of upper lobe, right bronchus or lung: Secondary | ICD-10-CM | POA: Insufficient documentation

## 2020-07-05 DIAGNOSIS — Z95828 Presence of other vascular implants and grafts: Secondary | ICD-10-CM

## 2020-07-05 DIAGNOSIS — Z79899 Other long term (current) drug therapy: Secondary | ICD-10-CM | POA: Insufficient documentation

## 2020-07-05 DIAGNOSIS — R1310 Dysphagia, unspecified: Secondary | ICD-10-CM | POA: Diagnosis not present

## 2020-07-05 LAB — CMP (CANCER CENTER ONLY)
ALT: 11 U/L (ref 0–44)
AST: 11 U/L — ABNORMAL LOW (ref 15–41)
Albumin: 3.4 g/dL — ABNORMAL LOW (ref 3.5–5.0)
Alkaline Phosphatase: 68 U/L (ref 38–126)
Anion gap: 6 (ref 5–15)
BUN: 13 mg/dL (ref 8–23)
CO2: 25 mmol/L (ref 22–32)
Calcium: 8.8 mg/dL — ABNORMAL LOW (ref 8.9–10.3)
Chloride: 105 mmol/L (ref 98–111)
Creatinine: 0.87 mg/dL (ref 0.61–1.24)
GFR, Estimated: >60 mL/min (ref >60)
Glucose, Bld: 136 mg/dL — ABNORMAL HIGH (ref 70–99)
Potassium: 4 mmol/L (ref 3.5–5.1)
Sodium: 136 mmol/L (ref 135–145)
Total Bilirubin: 0.5 mg/dL (ref 0.3–1.2)
Total Protein: 7 g/dL (ref 6.5–8.1)

## 2020-07-05 LAB — CBC WITH DIFFERENTIAL (CANCER CENTER ONLY)
Abs Immature Granulocytes: 0.01 10*3/uL (ref 0.00–0.07)
Basophils Absolute: 0 10*3/uL (ref 0.0–0.1)
Basophils Relative: 1 %
Eosinophils Absolute: 0.1 10*3/uL (ref 0.0–0.5)
Eosinophils Relative: 2 %
HCT: 39.6 % (ref 39.0–52.0)
Hemoglobin: 12.8 g/dL — ABNORMAL LOW (ref 13.0–17.0)
Immature Granulocytes: 0 %
Lymphocytes Relative: 17 %
Lymphs Abs: 0.9 10*3/uL (ref 0.7–4.0)
MCH: 26.2 pg (ref 26.0–34.0)
MCHC: 32.3 g/dL (ref 30.0–36.0)
MCV: 81.1 fL (ref 80.0–100.0)
Monocytes Absolute: 0.6 10*3/uL (ref 0.1–1.0)
Monocytes Relative: 11 %
Neutro Abs: 3.6 10*3/uL (ref 1.7–7.7)
Neutrophils Relative %: 69 %
Platelet Count: 174 10*3/uL (ref 150–400)
RBC: 4.88 MIL/uL (ref 4.22–5.81)
RDW: 17.2 % — ABNORMAL HIGH (ref 11.5–15.5)
WBC Count: 5.2 10*3/uL (ref 4.0–10.5)
nRBC: 0 % (ref 0.0–0.2)

## 2020-07-05 LAB — TSH: TSH: 2.178 u[IU]/mL (ref 0.320–4.118)

## 2020-07-05 MED ORDER — SODIUM CHLORIDE 0.9 % IV SOLN
1500.0000 mg | Freq: Once | INTRAVENOUS | Status: AC
Start: 2020-07-05 — End: 2020-07-05
  Administered 2020-07-05: 1500 mg via INTRAVENOUS
  Filled 2020-07-05: qty 30

## 2020-07-05 MED ORDER — SODIUM CHLORIDE 0.9 % IV SOLN
Freq: Once | INTRAVENOUS | Status: AC
  Filled 2020-07-05: qty 250

## 2020-07-05 MED ORDER — HEPARIN SOD (PORK) LOCK FLUSH 100 UNIT/ML IV SOLN
500.0000 [IU] | Freq: Once | INTRAVENOUS | Status: AC | PRN
  Administered 2020-07-05: 500 [IU]
  Filled 2020-07-05: qty 5

## 2020-07-05 MED ORDER — SODIUM CHLORIDE 0.9% FLUSH
10.0000 mL | Freq: Once | INTRAVENOUS | Status: AC
  Administered 2020-07-05: 10 mL
  Filled 2020-07-05: qty 10

## 2020-07-05 MED ORDER — SODIUM CHLORIDE 0.9% FLUSH
10.0000 mL | INTRAVENOUS | Status: DC | PRN
  Administered 2020-07-05: 10 mL
  Filled 2020-07-05: qty 10

## 2020-07-05 NOTE — Progress Notes (Signed)
[place] [phone]   [phone]) [phone]  OFFICE PROGRESS NOTE  [name], MD [name] [address]  DIAGNOSIS: Stage IIIA (T4, N0, M0) non-small cell lung cancer, squamous cell carcinoma presented with large right upper lobe lung mass diagnosed in October 2021.  PRIOR THERAPY: Concurrent chemoradiation with weekly carboplatin for AUC of 2 and paclitaxel 45 mg/M2. Status post 6 cycles. First dose on 02/27/20.  Last dose was given April 02, 2020.  CURRENT THERAPY:  Consolidation treatment with immunotherapy with Imfinzi 1500 mg IV every 4 weeks.  First dose May 10, 2020.  Status post 2 cycles.  INTERVAL HISTORY: Billy Hall 75 y.o. male returns to the clinic today for follow-up visit.  The patient is feeling fine.  He denied having any current chest pain, shortness of breath, cough or hemoptysis.  He denied having any nausea, vomiting, diarrhea or constipation.  He has no skin rash.  He has no headache or visual changes.  He continues to tolerate his treatment with Imfinzi fairly well.  The patient is here today for evaluation before starting cycle #3 of his treatment.   MEDICAL HISTORY: Past Medical History:  Diagnosis Date  . Arthritis   . Depressive disorder   . Diabetes mellitus without complication ([place])   . Fatigue   . Headache(784.0)   . Hyperlipidemia   . Hypertension   . Hypothyroidism   . Snoring     ALLERGIES:  is allergic to ace inhibitors.  MEDICATIONS:  Current Outpatient Medications  Medication Sig Dispense Refill  . acetaminophen (TYLENOL 8 HOUR) 650 MG CR tablet Take 650 mg by mouth every 8 (eight) hours as needed for pain.    Marland Kitchen aspirin EC 81 MG tablet Take 81 mg by mouth daily.    . cholecalciferol (VITAMIN D3) 25 MCG (1000 UNIT) tablet Take 1,000 Units by mouth daily.    Marland Kitchen levothyroxine (SYNTHROID) 88 MCG tablet Take 88 mcg by mouth daily before breakfast.     . losartan (COZAAR) 50  MG tablet Take 50 mg by mouth daily.     . metFORMIN (GLUCOPHAGE) 500 MG tablet Take 500 mg by mouth 2 (two) times daily with a meal.     . ONETOUCH ULTRA test strip 1 each 2 (two) times daily.    . prochlorperazine (COMPAZINE) 10 MG tablet Take 1 tablet (10 mg total) by mouth every 6 (six) hours as needed for nausea or vomiting. 30 tablet 0  . sildenafil (REVATIO) 20 MG tablet Take 20 mg by mouth daily as needed (ED).     . simvastatin (ZOCOR) 10 MG tablet Take 10 mg by mouth every evening.     . sucralfate (CARAFATE) 1 g tablet CRUSH (1) TABLET IN 1oz OF WATER AND DRINK 5 MINUTES BEFORE MEALS AND AT BEDTIME 4 TIMES DAILY 120 tablet 0  . tamsulosin (FLOMAX) 0.4 MG CAPS capsule Take 0.4 mg by mouth daily.     No current facility-administered medications for this visit.    SURGICAL HISTORY:  Past Surgical History:  Procedure Laterality Date  . COLONOSCOPY N/A 03/08/2013   Procedure: COLONOSCOPY;  Surgeon: Jamesetta So, MD;  [facility];  Service: Gastroenterology;  Laterality: N/A;  . IR IMAGING GUIDED PORT INSERTION  03/08/2020  . IR US GUIDE BX ASP/DRAIN  03/08/2020  . KNEE ARTHROSCOPY Right    2006 By Dr. Luna Glasgow at [facility].   . THYROID SURGERY    . VIDEO  BRONCHOSCOPY WITH ENDOBRONCHIAL ULTRASOUND N/A 01/30/2020   Procedure: VIDEO BRONCHOSCOPY WITH ENDOBRONCHIAL ULTRASOUND;  Surgeon: Melrose Nakayama, MD;  Location: [facility] OR;  Service: Thoracic;  Laterality: N/A;    REVIEW OF SYSTEMS:  A comprehensive review of systems was negative.   PHYSICAL EXAMINATION: General appearance: alert, cooperative and no distress Head: Normocephalic, without obvious abnormality, atraumatic Neck: no adenopathy, no JVD, supple, symmetrical, trachea midline and thyroid not enlarged, symmetric, no tenderness/mass/nodules Lymph nodes: Cervical, supraclavicular, and axillary nodes normal. Resp: clear to auscultation bilaterally Back: symmetric, no curvature. ROM normal. No CVA  tenderness. Cardio: regular rate and rhythm, S1, S2 normal, no murmur, click, rub or gallop GI: soft, non-tender; bowel sounds normal; no masses,  no organomegaly Extremities: extremities normal, atraumatic, no cyanosis or edema  ECOG PERFORMANCE STATUS: 1 - Symptomatic but completely ambulatory  Blood pressure (!) 143/76, pulse 84, temperature (!) 96.1 F (35.6 C), temperature source Tympanic, resp. rate 12, height 5\' 9"  (1.753 m), weight 270 lb (122.5 kg), SpO2 96 %.  LABORATORY DATA: Lab Results  Component Value Date   WBC 5.2 07/05/2020   HGB 12.8 (L) 07/05/2020   HCT 39.6 07/05/2020   MCV 81.1 07/05/2020   PLT 174 07/05/2020      Chemistry      Component Value Date/Time   NA 136 07/05/2020 1046   K 4.0 07/05/2020 1046   CL 105 07/05/2020 1046   CO2 25 07/05/2020 1046   BUN 13 07/05/2020 1046   CREATININE 0.87 07/05/2020 1046      Component Value Date/Time   CALCIUM 8.8 (L) 07/05/2020 1046   ALKPHOS 68 07/05/2020 1046   AST 11 (L) 07/05/2020 1046   ALT 11 07/05/2020 1046   BILITOT 0.5 07/05/2020 1046       RADIOGRAPHIC STUDIES: No results found.  ASSESSMENT AND PLAN: This is a very pleasant 75 years old African-American male recently diagnosed with a stage IIIA non-small cell lung cancer, squamous cell carcinoma presented with large right upper lobe lung mass in October 2021. The patient underwent a course of concurrent chemoradiation with weekly carboplatin and paclitaxel status post 6 cycles.  The patient tolerated this course of treatment fairly well except for mild odynophagia. Imaging studies after the induction phase of concurrent chemoradiation showed interval decrease in the size and significant central cavitation of the previously demonstrated right upper lobe lung mass consistent with response to therapy. The patient is currently undergoing consolidation treatment with immunotherapy with Imfinzi 1500 mg IV every 4 weeks status post 2 cycles.   The patient  is feeling fine today with no concerning complaints.  He has been tolerating this treatment well. I recommended for him to proceed with cycle #3 today as planned. I will see him back for follow-up visit in 4 weeks for evaluation with repeat CT scan of the chest for restaging of his disease. The patient was advised to call immediately if he has any concerning symptoms in the interval. The patient voices understanding of current disease status and treatment options and is in agreement with the current care plan.  All questions were answered. The patient knows to call the clinic with any problems, questions or concerns. We can certainly see the patient much sooner if necessary.   Disclaimer: This note was dictated with voice recognition software. Similar sounding words can inadvertently be transcribed and may not be corrected upon review.

## 2020-07-05 NOTE — Patient Instructions (Signed)
[facility] Discharge Instructions for Patients Receiving Chemotherapy  Today you received the following chemotherapy agents Durvulaumab  To help prevent nausea and vomiting after your treatment, we encourage you to take your nausea medication as directed.  If you develop nausea and vomiting that is not controlled by your nausea medication, call the clinic.   BELOW ARE SYMPTOMS THAT SHOULD BE REPORTED IMMEDIATELY:  *FEVER GREATER THAN 100.5 F  *CHILLS WITH OR WITHOUT FEVER  NAUSEA AND VOMITING THAT IS NOT CONTROLLED WITH YOUR NAUSEA MEDICATION  *UNUSUAL SHORTNESS OF BREATH  *UNUSUAL BRUISING OR BLEEDING  TENDERNESS IN MOUTH AND THROAT WITH OR WITHOUT PRESENCE OF ULCERS  *URINARY PROBLEMS  *BOWEL PROBLEMS  UNUSUAL RASH Items with * indicate a potential emergency and should be followed up as soon as possible.  Feel free to call the clinic should you have any questions or concerns. The clinic phone number is [phone].  Please show the [facility] at check-in to the Emergency Department and triage nurse.

## 2020-07-25 DIAGNOSIS — E1165 Type 2 diabetes mellitus with hyperglycemia: Secondary | ICD-10-CM | POA: Diagnosis not present

## 2020-07-25 DIAGNOSIS — I10 Essential (primary) hypertension: Secondary | ICD-10-CM | POA: Diagnosis not present

## 2020-07-25 DIAGNOSIS — Z7984 Long term (current) use of oral hypoglycemic drugs: Secondary | ICD-10-CM | POA: Diagnosis not present

## 2020-07-30 ENCOUNTER — Ambulatory Visit (HOSPITAL_COMMUNITY): Payer: Medicare Other

## 2020-07-31 ENCOUNTER — Telehealth: Payer: Self-pay | Admitting: Medical Oncology

## 2020-07-31 ENCOUNTER — Ambulatory Visit (HOSPITAL_COMMUNITY)
Admission: RE | Admit: 2020-07-31 | Discharge: 2020-07-31 | Disposition: A | Discharge status: Home / Self Care | Payer: Medicare Other | Source: Ambulatory Visit | Attending: Internal Medicine | Admitting: Internal Medicine

## 2020-07-31 ENCOUNTER — Encounter (HOSPITAL_COMMUNITY): Payer: Self-pay

## 2020-07-31 ENCOUNTER — Other Ambulatory Visit: Payer: Self-pay

## 2020-07-31 DIAGNOSIS — C3490 Malignant neoplasm of unspecified part of unspecified bronchus or lung: Secondary | ICD-10-CM | POA: Insufficient documentation

## 2020-07-31 DIAGNOSIS — J90 Pleural effusion, not elsewhere classified: Secondary | ICD-10-CM | POA: Diagnosis not present

## 2020-07-31 DIAGNOSIS — E890 Postprocedural hypothyroidism: Secondary | ICD-10-CM | POA: Diagnosis not present

## 2020-07-31 DIAGNOSIS — J984 Other disorders of lung: Secondary | ICD-10-CM | POA: Diagnosis not present

## 2020-07-31 MED ORDER — IOHEXOL 300 MG/ML  SOLN
75.0000 mL | Freq: Once | INTRAMUSCULAR | Status: AC | PRN
  Administered 2020-07-31: 75 mL via INTRAVENOUS

## 2020-07-31 MED ORDER — HEPARIN SOD (PORK) LOCK FLUSH 100 UNIT/ML IV SOLN: 500.0000 [IU] | Freq: Once | INTRAVENOUS | Status: DC

## 2020-07-31 MED ORDER — HEPARIN SOD (PORK) LOCK FLUSH 100 UNIT/ML IV SOLN
INTRAVENOUS | Status: AC
  Administered 2020-07-31: 500 [IU]
  Filled 2020-07-31: qty 5

## 2020-07-31 NOTE — Telephone Encounter (Signed)
Confirmed appt for tomorrow.

## 2020-08-02 ENCOUNTER — Inpatient Hospital Stay: Payer: Medicare Other

## 2020-08-02 ENCOUNTER — Other Ambulatory Visit: Payer: Self-pay | Admitting: Internal Medicine

## 2020-08-02 ENCOUNTER — Other Ambulatory Visit: Payer: Self-pay

## 2020-08-02 ENCOUNTER — Inpatient Hospital Stay: Payer: Medicare Other | Attending: Internal Medicine

## 2020-08-02 ENCOUNTER — Inpatient Hospital Stay: Payer: Medicare Other | Admitting: Internal Medicine

## 2020-08-02 VITALS — BP 124/70 | HR 78 | Temp 97.9°F | Resp 17 | Ht 69.0 in | Wt 270.7 lb

## 2020-08-02 DIAGNOSIS — R0602 Shortness of breath: Secondary | ICD-10-CM | POA: Diagnosis not present

## 2020-08-02 DIAGNOSIS — C3411 Malignant neoplasm of upper lobe, right bronchus or lung: Secondary | ICD-10-CM

## 2020-08-02 DIAGNOSIS — Z5112 Encounter for antineoplastic immunotherapy: Secondary | ICD-10-CM | POA: Diagnosis not present

## 2020-08-02 DIAGNOSIS — Z95828 Presence of other vascular implants and grafts: Secondary | ICD-10-CM

## 2020-08-02 DIAGNOSIS — J90 Pleural effusion, not elsewhere classified: Secondary | ICD-10-CM | POA: Diagnosis not present

## 2020-08-02 DIAGNOSIS — R1310 Dysphagia, unspecified: Secondary | ICD-10-CM | POA: Insufficient documentation

## 2020-08-02 DIAGNOSIS — Z9221 Personal history of antineoplastic chemotherapy: Secondary | ICD-10-CM | POA: Diagnosis not present

## 2020-08-02 DIAGNOSIS — Z79899 Other long term (current) drug therapy: Secondary | ICD-10-CM | POA: Diagnosis not present

## 2020-08-02 DIAGNOSIS — R0609 Other forms of dyspnea: Secondary | ICD-10-CM | POA: Insufficient documentation

## 2020-08-02 DIAGNOSIS — Z923 Personal history of irradiation: Secondary | ICD-10-CM | POA: Insufficient documentation

## 2020-08-02 DIAGNOSIS — R5383 Other fatigue: Secondary | ICD-10-CM | POA: Diagnosis not present

## 2020-08-02 LAB — CMP (CANCER CENTER ONLY)
ALT: 9 U/L (ref 0–44)
AST: 12 U/L — ABNORMAL LOW (ref 15–41)
Albumin: 3.4 g/dL — ABNORMAL LOW (ref 3.5–5.0)
Alkaline Phosphatase: 65 U/L (ref 38–126)
Anion gap: 12 (ref 5–15)
BUN: 17 mg/dL (ref 8–23)
CO2: 24 mmol/L (ref 22–32)
Calcium: 8.6 mg/dL — ABNORMAL LOW (ref 8.9–10.3)
Chloride: 105 mmol/L (ref 98–111)
Creatinine: 0.88 mg/dL (ref 0.61–1.24)
GFR, Estimated: >60 mL/min (ref >60)
Glucose, Bld: 127 mg/dL — ABNORMAL HIGH (ref 70–99)
Potassium: 4 mmol/L (ref 3.5–5.1)
Sodium: 141 mmol/L (ref 135–145)
Total Bilirubin: 0.4 mg/dL (ref 0.3–1.2)
Total Protein: 6.8 g/dL (ref 6.5–8.1)

## 2020-08-02 LAB — CBC WITH DIFFERENTIAL (CANCER CENTER ONLY)
Abs Immature Granulocytes: 0.02 10*3/uL (ref 0.00–0.07)
Basophils Absolute: 0 10*3/uL (ref 0.0–0.1)
Basophils Relative: 0 %
Eosinophils Absolute: 0.1 10*3/uL (ref 0.0–0.5)
Eosinophils Relative: 2 %
HCT: 38.9 % — ABNORMAL LOW (ref 39.0–52.0)
Hemoglobin: 12.7 g/dL — ABNORMAL LOW (ref 13.0–17.0)
Immature Granulocytes: 0 %
Lymphocytes Relative: 16 %
Lymphs Abs: 0.8 10*3/uL (ref 0.7–4.0)
MCH: 25.9 pg — ABNORMAL LOW (ref 26.0–34.0)
MCHC: 32.6 g/dL (ref 30.0–36.0)
MCV: 79.2 fL — ABNORMAL LOW (ref 80.0–100.0)
Monocytes Absolute: 0.6 10*3/uL (ref 0.1–1.0)
Monocytes Relative: 12 %
Neutro Abs: 3.3 10*3/uL (ref 1.7–7.7)
Neutrophils Relative %: 70 %
Platelet Count: 170 10*3/uL (ref 150–400)
RBC: 4.91 MIL/uL (ref 4.22–5.81)
RDW: 17.3 % — ABNORMAL HIGH (ref 11.5–15.5)
WBC Count: 4.8 10*3/uL (ref 4.0–10.5)
nRBC: 0 % (ref 0.0–0.2)

## 2020-08-02 LAB — TSH: TSH: 2.018 u[IU]/mL (ref 0.320–4.118)

## 2020-08-02 MED ORDER — SODIUM CHLORIDE 0.9 % IV SOLN
1500.0000 mg | Freq: Once | INTRAVENOUS | Status: AC
  Administered 2020-08-02: 1500 mg via INTRAVENOUS
  Filled 2020-08-02: qty 30

## 2020-08-02 MED ORDER — HEPARIN SOD (PORK) LOCK FLUSH 100 UNIT/ML IV SOLN
500.0000 [IU] | Freq: Once | INTRAVENOUS | Status: AC | PRN
  Administered 2020-08-02: 500 [IU]
  Filled 2020-08-02: qty 5

## 2020-08-02 MED ORDER — SODIUM CHLORIDE 0.9 % IV SOLN
Freq: Once | INTRAVENOUS | Status: AC
  Filled 2020-08-02: qty 250

## 2020-08-02 MED ORDER — SODIUM CHLORIDE 0.9% FLUSH
10.0000 mL | INTRAVENOUS | Status: DC | PRN
Start: 2020-08-02 — End: 2020-08-02
  Administered 2020-08-02: 10 mL
  Filled 2020-08-02: qty 10

## 2020-08-02 MED ORDER — SODIUM CHLORIDE 0.9% FLUSH
10.0000 mL | Freq: Once | INTRAVENOUS | Status: AC
  Administered 2020-08-02: 10 mL
  Filled 2020-08-02: qty 10

## 2020-08-02 NOTE — Patient Instructions (Signed)
[facility] Discharge Instructions for Patients Receiving Chemotherapy  Today you received the following chemotherapy agents Durvulaumab  To help prevent nausea and vomiting after your treatment, we encourage you to take your nausea medication as directed.  If you develop nausea and vomiting that is not controlled by your nausea medication, call the clinic.   BELOW ARE SYMPTOMS THAT SHOULD BE REPORTED IMMEDIATELY:  *FEVER GREATER THAN 100.5 F  *CHILLS WITH OR WITHOUT FEVER  NAUSEA AND VOMITING THAT IS NOT CONTROLLED WITH YOUR NAUSEA MEDICATION  *UNUSUAL SHORTNESS OF BREATH  *UNUSUAL BRUISING OR BLEEDING  TENDERNESS IN MOUTH AND THROAT WITH OR WITHOUT PRESENCE OF ULCERS  *URINARY PROBLEMS  *BOWEL PROBLEMS  UNUSUAL RASH Items with * indicate a potential emergency and should be followed up as soon as possible.  Feel free to call the clinic should you have any questions or concerns. The clinic phone number is [phone].  Please show the [facility] at check-in to the Emergency Department and triage nurse.

## 2020-08-02 NOTE — Progress Notes (Signed)
[facility] [phone]   Fax:(336) [phone]  OFFICE PROGRESS NOTE  [name], MD [place] [address]  DIAGNOSIS: Stage IIIA (T4, N0, M0) non-small cell lung cancer, squamous cell carcinoma presented with large right upper lobe lung mass diagnosed in October 2021.  PRIOR THERAPY: Concurrent chemoradiation with weekly carboplatin for AUC of 2 and paclitaxel 45 mg/M2. Status post 6 cycles. First dose on 02/27/20.  Last dose was given April 02, 2020.  CURRENT THERAPY:  Consolidation treatment with immunotherapy with Imfinzi 1500 mg IV every 4 weeks.  First dose May 10, 2020.  Status post 3 cycles.  INTERVAL HISTORY: Billy Hall 75 y.o. male returns to the clinic today for follow-up visit.  The patient is feeling fine today with no concerning complaints except for the baseline shortness of breath increased with exertion.  He denied having any current chest pain, cough or hemoptysis.  He denied having any fever or chills.  He has no nausea, vomiting, diarrhea or constipation.  He denied having any headache or visual changes.  He has no weight loss or night sweats.  He continues to tolerate his treatment with consolidation immunotherapy with Imfinzi fairly well.  The patient had repeat CT scan of the chest performed recently and he is here for evaluation and discussion of his scan results.   MEDICAL HISTORY: Past Medical History:  Diagnosis Date  . Arthritis   . Depressive disorder   . Diabetes mellitus without complication ([place])   . Fatigue   . Headache(784.0)   . Hyperlipidemia   . Hypertension   . Hypothyroidism   . Snoring     ALLERGIES:  is allergic to ace inhibitors.  MEDICATIONS:  Current Outpatient Medications  Medication Sig Dispense Refill  . acetaminophen (TYLENOL 8 HOUR) 650 MG CR tablet Take 650 mg by mouth every 8 (eight) hours as needed for pain.    Marland Kitchen aspirin EC 81 MG tablet Take 81 mg by mouth  daily.    . cholecalciferol (VITAMIN D3) 25 MCG (1000 UNIT) tablet Take 1,000 Units by mouth daily.    Marland Kitchen levothyroxine (SYNTHROID) 88 MCG tablet Take 88 mcg by mouth daily before breakfast.     . losartan (COZAAR) 50 MG tablet Take 50 mg by mouth daily.     . metFORMIN (GLUCOPHAGE) 500 MG tablet Take 500 mg by mouth 2 (two) times daily with a meal.     . ONETOUCH ULTRA test strip 1 each 2 (two) times daily.    . prochlorperazine (COMPAZINE) 10 MG tablet Take 1 tablet (10 mg total) by mouth every 6 (six) hours as needed for nausea or vomiting. 30 tablet 0  . sildenafil (REVATIO) 20 MG tablet Take 20 mg by mouth daily as needed (ED).     . simvastatin (ZOCOR) 10 MG tablet Take 10 mg by mouth every evening.     . sucralfate (CARAFATE) 1 g tablet CRUSH (1) TABLET IN 1oz OF WATER AND DRINK 5 MINUTES BEFORE MEALS AND AT BEDTIME 4 TIMES DAILY 120 tablet 0  . tamsulosin (FLOMAX) 0.4 MG CAPS capsule Take 0.4 mg by mouth daily.     No current facility-administered medications for this visit.    SURGICAL HISTORY:  Past Surgical History:  Procedure Laterality Date  . COLONOSCOPY N/A 03/08/2013   Procedure: COLONOSCOPY;  Surgeon: Jamesetta So, MD;  [facility];  Service: Gastroenterology;  Laterality: N/A;  . IR IMAGING GUIDED PORT INSERTION  03/08/2020  . IR US GUIDE BX ASP/DRAIN  03/08/2020  . KNEE ARTHROSCOPY Right    2006 By Dr. Luna Glasgow at [facility].   . THYROID SURGERY    . VIDEO BRONCHOSCOPY WITH ENDOBRONCHIAL ULTRASOUND N/A 01/30/2020   Procedure: VIDEO BRONCHOSCOPY WITH ENDOBRONCHIAL ULTRASOUND;  Surgeon: Melrose Nakayama, MD;  Location: [facility];  Service: Thoracic;  Laterality: N/A;    REVIEW OF SYSTEMS:  Constitutional: positive for fatigue Eyes: negative Ears, nose, mouth, throat, and face: negative Respiratory: positive for dyspnea on exertion Cardiovascular: negative Gastrointestinal: negative Genitourinary:negative Integument/breast: negative Hematologic/lymphatic:  negative Musculoskeletal:negative Neurological: negative Behavioral/Psych: negative Endocrine: negative Allergic/Immunologic: negative   PHYSICAL EXAMINATION: General appearance: alert, cooperative and no distress Head: Normocephalic, without obvious abnormality, atraumatic Neck: no adenopathy, no JVD, supple, symmetrical, trachea midline and thyroid not enlarged, symmetric, no tenderness/mass/nodules Lymph nodes: Cervical, supraclavicular, and axillary nodes normal. Resp: clear to auscultation bilaterally Back: symmetric, no curvature. ROM normal. No CVA tenderness. Cardio: regular rate and rhythm, S1, S2 normal, no murmur, click, rub or gallop GI: soft, non-tender; bowel sounds normal; no masses,  no organomegaly Extremities: extremities normal, atraumatic, no cyanosis or edema Neurologic: Alert and oriented X 3, normal strength and tone. Normal symmetric reflexes. Normal coordination and gait  ECOG PERFORMANCE STATUS: 1 - Symptomatic but completely ambulatory  Blood pressure 124/70, pulse 78, temperature 97.9 F (36.6 C), temperature source Tympanic, resp. rate 17, height 5\' 9"  (1.753 m), weight 270 lb 11.2 oz (122.8 kg), SpO2 96 %.  LABORATORY DATA: Lab Results  Component Value Date   WBC 4.8 08/02/2020   HGB 12.7 (L) 08/02/2020   HCT 38.9 (L) 08/02/2020   MCV 79.2 (L) 08/02/2020   PLT 170 08/02/2020      Chemistry      Component Value Date/Time   NA 141 08/02/2020 0943   K 4.0 08/02/2020 0943   CL 105 08/02/2020 0943   CO2 24 08/02/2020 0943   BUN 17 08/02/2020 0943   CREATININE 0.88 08/02/2020 0943      Component Value Date/Time   CALCIUM 8.6 (L) 08/02/2020 0943   ALKPHOS 65 08/02/2020 0943   AST 12 (L) 08/02/2020 0943   ALT 9 08/02/2020 0943   BILITOT 0.4 08/02/2020 0943       RADIOGRAPHIC STUDIES: CT Chest W Contrast  Result Date: 07/31/2020 CLINICAL DATA:  Primary Cancer Type: Lung Imaging Indication: Assess response to therapy Interval therapy since  last imaging? Yes Initial Cancer Diagnosis Date: 01/30/2020; Established by: Biopsy-proven Detailed Pathology: Stage IIIA non-small cell lung cancer, squamous cell carcinoma. Primary Tumor location:  Right upper lobe. Surgeries: Hemithyroidectomy. Chemotherapy: Yes; Ongoing? No; Most recent administration: 04/02/2020 Immunotherapy?  Yes; Type: Imfinzi; Ongoing? Yes Radiation therapy? Yes; Date Range: 02/20/2020 - 04/05/2020; Target: Right lung. EXAM: CT CHEST WITH CONTRAST TECHNIQUE: Multidetector CT imaging of the chest was performed during intravenous contrast administration. CONTRAST:  76mL OMNIPAQUE IOHEXOL 300 MG/ML  SOLN COMPARISON:  Most recent CT chest 05/01/2020.  02/06/2020 PET-CT. FINDINGS: Cardiovascular: Port in the anterior chest wall with tip in distal SVC. Mediastinum/Nodes: No axillary or supraclavicular adenopathy. No mediastinal or hilar adenopathy. No pericardial fluid. Esophagus normal. Lungs/Pleura: Interval increase in RIGHT upper lobe perihilar consolidation with air bronchograms typical of radiation change. Cavitary lesion in the peripheral RIGHT lung apex is similar to prior measuring 6.8 x 2.6 cm compared to 6.3 x 3.8 cm. There are multiple nodules centrally within the cavitation unchanged from prior. Moderate small RIGHT effusion is increased in size. No nodularity in  the RIGHT lower lobe. No enlarged LEFT lung nodules. Upper Abdomen: Limited view of the liver, kidneys, pancreas are unremarkable. Normal adrenal glands. Musculoskeletal: No aggressive osseous lesion. IMPRESSION: 1. Stable cavitary lesion in the RIGHT lung apex with central nodularity. 2. Increase in perihilar radiation change in the RIGHT upper lobe. 3. No evidence lung cancer recurrence or progression. 4. New small RIGHT pleural effusion. Electronically Signed   By: Suzy Bouchard M.D.   On: 07/31/2020 11:38    ASSESSMENT AND PLAN: This is a very pleasant 75 years old African-American male recently diagnosed with a  stage IIIA non-small cell lung cancer, squamous cell carcinoma presented with large right upper lobe lung mass in October 2021. The patient underwent a course of concurrent chemoradiation with weekly carboplatin and paclitaxel status post 6 cycles.  The patient tolerated this course of treatment fairly well except for mild odynophagia. Imaging studies after the induction phase of concurrent chemoradiation showed interval decrease in the size and significant central cavitation of the previously demonstrated right upper lobe lung mass consistent with response to therapy. The patient is currently undergoing consolidation treatment with immunotherapy with Imfinzi 1500 mg IV every 4 weeks status post 3 cycles.   Has been tolerating this treatment well with no concerning adverse effect except for mild fatigue. He had repeat CT scan of the chest performed recently.  I personally and independently reviewed the scan images and discussed the results with the patient today. His scan showed no concerning findings for disease progression and he continues to have evolving radiation changes. I recommended for the patient to continue his current treatment with Imfinzi and he will proceed with cycle #4 today. He will come back for follow-up visit in 4 weeks for evaluation before the next cycle of his treatment. The patient was advised to call immediately if he has any concerning symptoms in the interval. The patient voices understanding of current disease status and treatment options and is in agreement with the current care plan.  All questions were answered. The patient knows to call the clinic with any problems, questions or concerns. We can certainly see the patient much sooner if necessary.   Disclaimer: This note was dictated with voice recognition software. Similar sounding words can inadvertently be transcribed and may not be corrected upon review.

## 2020-08-03 ENCOUNTER — Telehealth: Payer: Self-pay | Admitting: Internal Medicine

## 2020-08-03 NOTE — Telephone Encounter (Signed)
Scheduled per los. Called and left msg. Mailed printout  °

## 2020-08-25 DIAGNOSIS — E1165 Type 2 diabetes mellitus with hyperglycemia: Secondary | ICD-10-CM | POA: Diagnosis not present

## 2020-08-25 DIAGNOSIS — I10 Essential (primary) hypertension: Secondary | ICD-10-CM | POA: Diagnosis not present

## 2020-08-30 ENCOUNTER — Other Ambulatory Visit: Payer: Self-pay

## 2020-08-30 ENCOUNTER — Other Ambulatory Visit: Payer: Medicare Other

## 2020-08-30 ENCOUNTER — Inpatient Hospital Stay: Payer: Medicare Other | Attending: Internal Medicine

## 2020-08-30 ENCOUNTER — Inpatient Hospital Stay: Payer: Medicare Other

## 2020-08-30 ENCOUNTER — Encounter: Payer: Self-pay | Admitting: Internal Medicine

## 2020-08-30 ENCOUNTER — Inpatient Hospital Stay (HOSPITAL_BASED_OUTPATIENT_CLINIC_OR_DEPARTMENT_OTHER): Payer: Medicare Other | Admitting: Internal Medicine

## 2020-08-30 VITALS — BP 130/78 | HR 96 | Temp 95.9°F | Resp 20 | Ht 69.0 in | Wt 269.6 lb

## 2020-08-30 DIAGNOSIS — R0609 Other forms of dyspnea: Secondary | ICD-10-CM | POA: Diagnosis not present

## 2020-08-30 DIAGNOSIS — Z95828 Presence of other vascular implants and grafts: Secondary | ICD-10-CM

## 2020-08-30 DIAGNOSIS — Z9221 Personal history of antineoplastic chemotherapy: Secondary | ICD-10-CM

## 2020-08-30 DIAGNOSIS — Z79899 Other long term (current) drug therapy: Secondary | ICD-10-CM | POA: Diagnosis not present

## 2020-08-30 DIAGNOSIS — C3411 Malignant neoplasm of upper lobe, right bronchus or lung: Secondary | ICD-10-CM | POA: Diagnosis not present

## 2020-08-30 DIAGNOSIS — J90 Pleural effusion, not elsewhere classified: Secondary | ICD-10-CM

## 2020-08-30 DIAGNOSIS — R1310 Dysphagia, unspecified: Secondary | ICD-10-CM | POA: Diagnosis not present

## 2020-08-30 DIAGNOSIS — J984 Other disorders of lung: Secondary | ICD-10-CM

## 2020-08-30 DIAGNOSIS — Z5112 Encounter for antineoplastic immunotherapy: Secondary | ICD-10-CM | POA: Diagnosis not present

## 2020-08-30 DIAGNOSIS — R5383 Other fatigue: Secondary | ICD-10-CM | POA: Insufficient documentation

## 2020-08-30 DIAGNOSIS — R058 Other specified cough: Secondary | ICD-10-CM | POA: Diagnosis not present

## 2020-08-30 DIAGNOSIS — Z923 Personal history of irradiation: Secondary | ICD-10-CM | POA: Insufficient documentation

## 2020-08-30 LAB — CBC WITH DIFFERENTIAL (CANCER CENTER ONLY)
Abs Immature Granulocytes: 0.01 10*3/uL (ref 0.00–0.07)
Basophils Absolute: 0 10*3/uL (ref 0.0–0.1)
Basophils Relative: 1 %
Eosinophils Absolute: 0.1 10*3/uL (ref 0.0–0.5)
Eosinophils Relative: 2 %
HCT: 40 % (ref 39.0–52.0)
Hemoglobin: 12.8 g/dL — ABNORMAL LOW (ref 13.0–17.0)
Immature Granulocytes: 0 %
Lymphocytes Relative: 15 %
Lymphs Abs: 0.8 10*3/uL (ref 0.7–4.0)
MCH: 24.5 pg — ABNORMAL LOW (ref 26.0–34.0)
MCHC: 32 g/dL (ref 30.0–36.0)
MCV: 76.5 fL — ABNORMAL LOW (ref 80.0–100.0)
Monocytes Absolute: 0.5 10*3/uL (ref 0.1–1.0)
Monocytes Relative: 10 %
Neutro Abs: 4.1 10*3/uL (ref 1.7–7.7)
Neutrophils Relative %: 72 %
Platelet Count: 227 10*3/uL (ref 150–400)
RBC: 5.23 MIL/uL (ref 4.22–5.81)
RDW: 18 % — ABNORMAL HIGH (ref 11.5–15.5)
WBC Count: 5.6 10*3/uL (ref 4.0–10.5)
nRBC: 0 % (ref 0.0–0.2)

## 2020-08-30 LAB — CMP (CANCER CENTER ONLY)
ALT: 20 U/L (ref 0–44)
AST: 17 U/L (ref 15–41)
Albumin: 3.1 g/dL — ABNORMAL LOW (ref 3.5–5.0)
Alkaline Phosphatase: 94 U/L (ref 38–126)
Anion gap: 8 (ref 5–15)
BUN: 11 mg/dL (ref 8–23)
CO2: 28 mmol/L (ref 22–32)
Calcium: 9.2 mg/dL (ref 8.9–10.3)
Chloride: 103 mmol/L (ref 98–111)
Creatinine: 0.8 mg/dL (ref 0.61–1.24)
GFR, Estimated: >60 mL/min (ref >60)
Glucose, Bld: 156 mg/dL — ABNORMAL HIGH (ref 70–99)
Potassium: 4.1 mmol/L (ref 3.5–5.1)
Sodium: 139 mmol/L (ref 135–145)
Total Bilirubin: 0.4 mg/dL (ref 0.3–1.2)
Total Protein: 7.2 g/dL (ref 6.5–8.1)

## 2020-08-30 LAB — TSH: TSH: 1.436 u[IU]/mL (ref 0.350–4.500)

## 2020-08-30 MED ORDER — SODIUM CHLORIDE 0.9% FLUSH
10.0000 mL | INTRAVENOUS | Status: DC | PRN
  Administered 2020-08-30: 10 mL
  Filled 2020-08-30: qty 10

## 2020-08-30 MED ORDER — HEPARIN SOD (PORK) LOCK FLUSH 100 UNIT/ML IV SOLN
500.0000 [IU] | Freq: Once | INTRAVENOUS | Status: AC | PRN
  Administered 2020-08-30: 500 [IU]
  Filled 2020-08-30: qty 5

## 2020-08-30 MED ORDER — SODIUM CHLORIDE 0.9 % IV SOLN
1500.0000 mg | Freq: Once | INTRAVENOUS | Status: AC
  Administered 2020-08-30: 1500 mg via INTRAVENOUS
  Filled 2020-08-30: qty 30

## 2020-08-30 MED ORDER — SODIUM CHLORIDE 0.9 % IV SOLN
Freq: Once | INTRAVENOUS | Status: AC
Start: 2020-08-30 — End: 2020-08-30
  Filled 2020-08-30: qty 250

## 2020-08-30 MED ORDER — SODIUM CHLORIDE 0.9% FLUSH
10.0000 mL | Freq: Once | INTRAVENOUS | Status: AC
  Administered 2020-08-30: 10 mL
  Filled 2020-08-30: qty 10

## 2020-08-30 NOTE — Progress Notes (Signed)
[facility] [phone]) [phone]   Fax:(336) [phone]  OFFICE PROGRESS NOTE  [name], MD [place] [address]  DIAGNOSIS: Stage IIIA (T4, N0, M0) non-small cell lung cancer, squamous cell carcinoma presented with large right upper lobe lung mass diagnosed in October 2021.  PRIOR THERAPY: Concurrent chemoradiation with weekly carboplatin for AUC of 2 and paclitaxel 45 mg/M2. Status post 6 cycles. First dose on 02/27/20.  Last dose was given April 02, 2020.  CURRENT THERAPY:  Consolidation treatment with immunotherapy with Imfinzi 1500 mg IV every 4 weeks.  First dose May 10, 2020.  Status post 4 cycles.  INTERVAL HISTORY: Billy Hall 75 y.o. male returns to the clinic today for follow-up visit.  The patient is feeling fine today with no concerning complaints except for the baseline shortness of breath and mild cough productive of whitish sputum.  He denied having any chest pain or hemoptysis.  He denied having any nausea, vomiting, diarrhea or constipation.  He has no headache or visual changes.  He has no significant weight loss or night sweats.  He continues to tolerate his treatment with immunotherapy fairly well.  He is here for evaluation before starting cycle #5.   MEDICAL HISTORY: Past Medical History:  Diagnosis Date  . Arthritis   . Depressive disorder   . Diabetes mellitus without complication ([place])   . Fatigue   . Headache(784.0)   . Hyperlipidemia   . Hypertension   . Hypothyroidism   . Snoring     ALLERGIES:  is allergic to ace inhibitors.  MEDICATIONS:  Current Outpatient Medications  Medication Sig Dispense Refill  . acetaminophen (TYLENOL 8 HOUR) 650 MG CR tablet Take 650 mg by mouth every 8 (eight) hours as needed for pain.    Marland Kitchen aspirin EC 81 MG tablet Take 81 mg by mouth daily.    . cholecalciferol (VITAMIN D3) 25 MCG (1000 UNIT) tablet Take 1,000 Units by mouth daily.    Marland Kitchen levothyroxine  (SYNTHROID) 88 MCG tablet Take 88 mcg by mouth daily before breakfast.     . losartan (COZAAR) 50 MG tablet Take 50 mg by mouth daily.     . metFORMIN (GLUCOPHAGE) 500 MG tablet Take 500 mg by mouth 2 (two) times daily with a meal.     . ONETOUCH ULTRA test strip 1 each 2 (two) times daily.    . prochlorperazine (COMPAZINE) 10 MG tablet Take 1 tablet (10 mg total) by mouth every 6 (six) hours as needed for nausea or vomiting. 30 tablet 0  . sildenafil (REVATIO) 20 MG tablet Take 20 mg by mouth daily as needed (ED).     . simvastatin (ZOCOR) 10 MG tablet Take 10 mg by mouth every evening.     . sucralfate (CARAFATE) 1 g tablet CRUSH (1) TABLET IN 1oz OF WATER AND DRINK 5 MINUTES BEFORE MEALS AND AT BEDTIME 4 TIMES DAILY 120 tablet 0  . tamsulosin (FLOMAX) 0.4 MG CAPS capsule Take 0.4 mg by mouth daily.     No current facility-administered medications for this visit.    SURGICAL HISTORY:  Past Surgical History:  Procedure Laterality Date  . COLONOSCOPY N/A 03/08/2013   Procedure: COLONOSCOPY;  Surgeon: Jamesetta So, MD;  [facility];  Service: Gastroenterology;  Laterality: N/A;  . IR IMAGING GUIDED PORT INSERTION  03/08/2020  . IR US GUIDE BX ASP/DRAIN  03/08/2020  . KNEE ARTHROSCOPY Right    2006 By Dr.  Keeling at [facility].   . THYROID SURGERY    . VIDEO BRONCHOSCOPY WITH ENDOBRONCHIAL ULTRASOUND N/A 01/30/2020   Procedure: VIDEO BRONCHOSCOPY WITH ENDOBRONCHIAL ULTRASOUND;  Surgeon: Melrose Nakayama, MD;  Location: [facility];  Service: Thoracic;  Laterality: N/A;    REVIEW OF SYSTEMS:  A comprehensive review of systems was negative except for: Constitutional: positive for fatigue Respiratory: positive for cough and dyspnea on exertion   PHYSICAL EXAMINATION: General appearance: alert, cooperative, fatigued and no distress Head: Normocephalic, without obvious abnormality, atraumatic Neck: no adenopathy, no JVD, supple, symmetrical, trachea midline and thyroid not enlarged,  symmetric, no tenderness/mass/nodules Lymph nodes: Cervical, supraclavicular, and axillary nodes normal. Resp: clear to auscultation bilaterally Back: symmetric, no curvature. ROM normal. No CVA tenderness. Cardio: regular rate and rhythm, S1, S2 normal, no murmur, click, rub or gallop GI: soft, non-tender; bowel sounds normal; no masses,  no organomegaly Extremities: extremities normal, atraumatic, no cyanosis or edema  ECOG PERFORMANCE STATUS: 1 - Symptomatic but completely ambulatory  Blood pressure 130/78, pulse 96, temperature (!) 95.9 F (35.5 C), temperature source Tympanic, resp. rate 20, height 5\' 9"  (1.753 m), weight 269 lb 9.6 oz (122.3 kg), SpO2 95 %.  LABORATORY DATA: Lab Results  Component Value Date   WBC 5.6 08/30/2020   HGB 12.8 (L) 08/30/2020   HCT 40.0 08/30/2020   MCV 76.5 (L) 08/30/2020   PLT 227 08/30/2020      Chemistry      Component Value Date/Time   NA 141 08/02/2020 0943   K 4.0 08/02/2020 0943   CL 105 08/02/2020 0943   CO2 24 08/02/2020 0943   BUN 17 08/02/2020 0943   CREATININE 0.88 08/02/2020 0943      Component Value Date/Time   CALCIUM 8.6 (L) 08/02/2020 0943   ALKPHOS 65 08/02/2020 0943   AST 12 (L) 08/02/2020 0943   ALT 9 08/02/2020 0943   BILITOT 0.4 08/02/2020 0943       RADIOGRAPHIC STUDIES: CT Chest W Contrast  Result Date: 07/31/2020 CLINICAL DATA:  Primary Cancer Type: Lung Imaging Indication: Assess response to therapy Interval therapy since last imaging? Yes Initial Cancer Diagnosis Date: 01/30/2020; Established by: Biopsy-proven Detailed Pathology: Stage IIIA non-small cell lung cancer, squamous cell carcinoma. Primary Tumor location:  Right upper lobe. Surgeries: Hemithyroidectomy. Chemotherapy: Yes; Ongoing? No; Most recent administration: 04/02/2020 Immunotherapy?  Yes; Type: Imfinzi; Ongoing? Yes Radiation therapy? Yes; Date Range: 02/20/2020 - 04/05/2020; Target: Right lung. EXAM: CT CHEST WITH CONTRAST TECHNIQUE:  Multidetector CT imaging of the chest was performed during intravenous contrast administration. CONTRAST:  39mL OMNIPAQUE IOHEXOL 300 MG/ML  SOLN COMPARISON:  Most recent CT chest 05/01/2020.  02/06/2020 PET-CT. FINDINGS: Cardiovascular: Port in the anterior chest wall with tip in distal SVC. Mediastinum/Nodes: No axillary or supraclavicular adenopathy. No mediastinal or hilar adenopathy. No pericardial fluid. Esophagus normal. Lungs/Pleura: Interval increase in RIGHT upper lobe perihilar consolidation with air bronchograms typical of radiation change. Cavitary lesion in the peripheral RIGHT lung apex is similar to prior measuring 6.8 x 2.6 cm compared to 6.3 x 3.8 cm. There are multiple nodules centrally within the cavitation unchanged from prior. Moderate small RIGHT effusion is increased in size. No nodularity in the RIGHT lower lobe. No enlarged LEFT lung nodules. Upper Abdomen: Limited view of the liver, kidneys, pancreas are unremarkable. Normal adrenal glands. Musculoskeletal: No aggressive osseous lesion. IMPRESSION: 1. Stable cavitary lesion in the RIGHT lung apex with central nodularity. 2. Increase in perihilar radiation change in the RIGHT upper lobe. 3.  No evidence lung cancer recurrence or progression. 4. New small RIGHT pleural effusion. Electronically Signed   By: Suzy Bouchard M.D.   On: 07/31/2020 11:38    ASSESSMENT AND PLAN: This is a very pleasant 75 years old African-American male recently diagnosed with a stage IIIA non-small cell lung cancer, squamous cell carcinoma presented with large right upper lobe lung mass in October 2021. The patient underwent a course of concurrent chemoradiation with weekly carboplatin and paclitaxel status post 6 cycles.  The patient tolerated this course of treatment fairly well except for mild odynophagia. Imaging studies after the induction phase of concurrent chemoradiation showed interval decrease in the size and significant central cavitation of the  previously demonstrated right upper lobe lung mass consistent with response to therapy. The patient is currently undergoing consolidation treatment with immunotherapy with Imfinzi 1500 mg IV every 4 weeks status post 4 cycles.   The patient continues to tolerate his treatment well with no concerning adverse effect except for fatigue and the baseline shortness of breath. I recommended for the patient to proceed with cycle #5 today as planned. He will come back for follow-up visit in 4 weeks for evaluation before the next cycle of his treatment He was advised to call immediately if he has any concerning symptoms in the interval. The patient voices understanding of current disease status and treatment options and is in agreement with the current care plan.  All questions were answered. The patient knows to call the clinic with any problems, questions or concerns. We can certainly see the patient much sooner if necessary.   Disclaimer: This note was dictated with voice recognition software. Similar sounding words can inadvertently be transcribed and may not be corrected upon review.

## 2020-08-30 NOTE — Patient Instructions (Signed)
[facility] MEDICAL ONCOLOGY  Discharge Instructions: Thank you for choosing [facility] to provide your oncology and hematology care.   If you have a lab appointment with the Cancer Center, please go directly to the Cancer Center and check in at the registration area.   Wear comfortable clothing and clothing appropriate for easy access to any Portacath or PICC line.   We strive to give you quality time with your provider. You may need to reschedule your appointment if you arrive late (15 or more minutes).  Arriving late affects you and other patients whose appointments are after yours.  Also, if you miss three or more appointments without notifying the office, you may be dismissed from the clinic at the provider's discretion.      For prescription refill requests, have your pharmacy contact our office and allow 72 hours for refills to be completed.    Today you received the following chemotherapy and/or immunotherapy agents Imfinzi      To help prevent nausea and vomiting after your treatment, we encourage you to take your nausea medication as directed.  BELOW ARE SYMPTOMS THAT SHOULD BE REPORTED IMMEDIATELY: *FEVER GREATER THAN 100.4 F (38 C) OR HIGHER *CHILLS OR SWEATING *NAUSEA AND VOMITING THAT IS NOT CONTROLLED WITH YOUR NAUSEA MEDICATION *UNUSUAL SHORTNESS OF BREATH *UNUSUAL BRUISING OR BLEEDING *URINARY PROBLEMS (pain or burning when urinating, or frequent urination) *BOWEL PROBLEMS (unusual diarrhea, constipation, pain near the anus) TENDERNESS IN MOUTH AND THROAT WITH OR WITHOUT PRESENCE OF ULCERS (sore throat, sores in mouth, or a toothache) UNUSUAL RASH, SWELLING OR PAIN  UNUSUAL VAGINAL DISCHARGE OR ITCHING   Items with * indicate a potential emergency and should be followed up as soon as possible or go to the Emergency Department if any problems should occur.  Please show the CHEMOTHERAPY ALERT CARD or IMMUNOTHERAPY ALERT CARD at check-in to the  Emergency Department and triage nurse.  Should you have questions after your visit or need to cancel or reschedule your appointment, please contact [facility]ICAL [facility]COL[facility]  Dept: [phone]  and follow the prompts.  Office hours are 8:00 a.m. to 4:30 p.m. Monday - Friday. Please note that voicemails left after 4:00 p.m. may not be returned until the following business day.  We are closed weekends and major holidays. You have access to a nurse at all times for urgent questions. Please call the main number to the clinic Dept: [phone] and follow the prompts.   For any non-urgent questions, you may also contact your provider using MyChart. We now offer e-Visits for anyone 18 and older to request care online for non-urgent symptoms. For details visit [email or web address].   Also download the MyChart app! Go to the app store, search "MyChart", open the app, select [facility], and log in with your MyChart username and password.  Due to Covid, a mask is required upon entering the hospital/clinic. If you do not have a mask, one will be given to you upon arrival. For doctor visits, patients may have 1 support person aged 18 or older with them. For treatment visits, patients cannot have anyone with them due to current Covid guidelines and our immunocompromised population.   

## 2020-09-25 DIAGNOSIS — E1165 Type 2 diabetes mellitus with hyperglycemia: Secondary | ICD-10-CM | POA: Diagnosis not present

## 2020-09-25 DIAGNOSIS — I10 Essential (primary) hypertension: Secondary | ICD-10-CM | POA: Diagnosis not present

## 2020-09-27 ENCOUNTER — Inpatient Hospital Stay: Payer: Medicare Other | Attending: Internal Medicine | Admitting: Internal Medicine

## 2020-09-27 ENCOUNTER — Other Ambulatory Visit: Payer: Medicare Other

## 2020-09-27 ENCOUNTER — Encounter: Payer: Self-pay | Admitting: Internal Medicine

## 2020-09-27 ENCOUNTER — Other Ambulatory Visit: Payer: Self-pay

## 2020-09-27 ENCOUNTER — Inpatient Hospital Stay: Payer: Medicare Other

## 2020-09-27 DIAGNOSIS — M47814 Spondylosis without myelopathy or radiculopathy, thoracic region: Secondary | ICD-10-CM | POA: Diagnosis not present

## 2020-09-27 DIAGNOSIS — Z79899 Other long term (current) drug therapy: Secondary | ICD-10-CM | POA: Insufficient documentation

## 2020-09-27 DIAGNOSIS — R0602 Shortness of breath: Secondary | ICD-10-CM | POA: Insufficient documentation

## 2020-09-27 DIAGNOSIS — Z9221 Personal history of antineoplastic chemotherapy: Secondary | ICD-10-CM | POA: Insufficient documentation

## 2020-09-27 DIAGNOSIS — Z5112 Encounter for antineoplastic immunotherapy: Secondary | ICD-10-CM | POA: Insufficient documentation

## 2020-09-27 DIAGNOSIS — I700 Atherosclerosis of aorta: Secondary | ICD-10-CM | POA: Insufficient documentation

## 2020-09-27 DIAGNOSIS — R058 Other specified cough: Secondary | ICD-10-CM | POA: Insufficient documentation

## 2020-09-27 DIAGNOSIS — J90 Pleural effusion, not elsewhere classified: Secondary | ICD-10-CM | POA: Diagnosis not present

## 2020-09-27 DIAGNOSIS — Z923 Personal history of irradiation: Secondary | ICD-10-CM | POA: Diagnosis not present

## 2020-09-27 DIAGNOSIS — R1310 Dysphagia, unspecified: Secondary | ICD-10-CM | POA: Diagnosis not present

## 2020-09-27 DIAGNOSIS — C3411 Malignant neoplasm of upper lobe, right bronchus or lung: Secondary | ICD-10-CM

## 2020-09-27 DIAGNOSIS — R5383 Other fatigue: Secondary | ICD-10-CM | POA: Insufficient documentation

## 2020-09-27 DIAGNOSIS — Z95828 Presence of other vascular implants and grafts: Secondary | ICD-10-CM

## 2020-09-27 DIAGNOSIS — C3490 Malignant neoplasm of unspecified part of unspecified bronchus or lung: Secondary | ICD-10-CM

## 2020-09-27 DIAGNOSIS — N281 Cyst of kidney, acquired: Secondary | ICD-10-CM | POA: Diagnosis not present

## 2020-09-27 DIAGNOSIS — R0609 Other forms of dyspnea: Secondary | ICD-10-CM | POA: Diagnosis not present

## 2020-09-27 LAB — CBC WITH DIFFERENTIAL (CANCER CENTER ONLY)
Abs Immature Granulocytes: 0.01 10*3/uL (ref 0.00–0.07)
Basophils Absolute: 0 10*3/uL (ref 0.0–0.1)
Basophils Relative: 0 %
Eosinophils Absolute: 0.1 10*3/uL (ref 0.0–0.5)
Eosinophils Relative: 1 %
HCT: 39.3 % (ref 39.0–52.0)
Hemoglobin: 12.5 g/dL — ABNORMAL LOW (ref 13.0–17.0)
Immature Granulocytes: 0 %
Lymphocytes Relative: 13 %
Lymphs Abs: 0.8 10*3/uL (ref 0.7–4.0)
MCH: 24.4 pg — ABNORMAL LOW (ref 26.0–34.0)
MCHC: 31.8 g/dL (ref 30.0–36.0)
MCV: 76.6 fL — ABNORMAL LOW (ref 80.0–100.0)
Monocytes Absolute: 0.5 10*3/uL (ref 0.1–1.0)
Monocytes Relative: 9 %
Neutro Abs: 4.4 10*3/uL (ref 1.7–7.7)
Neutrophils Relative %: 77 %
Platelet Count: 206 10*3/uL (ref 150–400)
RBC: 5.13 MIL/uL (ref 4.22–5.81)
RDW: 18.5 % — ABNORMAL HIGH (ref 11.5–15.5)
WBC Count: 5.8 10*3/uL (ref 4.0–10.5)
nRBC: 0 % (ref 0.0–0.2)

## 2020-09-27 LAB — CMP (CANCER CENTER ONLY)
ALT: 8 U/L (ref 0–44)
AST: 11 U/L — ABNORMAL LOW (ref 15–41)
Albumin: 3.3 g/dL — ABNORMAL LOW (ref 3.5–5.0)
Alkaline Phosphatase: 76 U/L (ref 38–126)
Anion gap: 12 (ref 5–15)
BUN: 16 mg/dL (ref 8–23)
CO2: 25 mmol/L (ref 22–32)
Calcium: 9.2 mg/dL (ref 8.9–10.3)
Chloride: 104 mmol/L (ref 98–111)
Creatinine: 0.92 mg/dL (ref 0.61–1.24)
GFR, Estimated: >60 mL/min (ref >60)
Glucose, Bld: 144 mg/dL — ABNORMAL HIGH (ref 70–99)
Potassium: 4.1 mmol/L (ref 3.5–5.1)
Sodium: 141 mmol/L (ref 135–145)
Total Bilirubin: 0.5 mg/dL (ref 0.3–1.2)
Total Protein: 7.2 g/dL (ref 6.5–8.1)

## 2020-09-27 LAB — TSH: TSH: 1.128 u[IU]/mL (ref 0.320–4.118)

## 2020-09-27 MED ORDER — SODIUM CHLORIDE 0.9% FLUSH
10.0000 mL | Freq: Once | INTRAVENOUS | Status: AC
  Administered 2020-09-27: 10 mL
  Filled 2020-09-27: qty 10

## 2020-09-27 MED ORDER — SODIUM CHLORIDE 0.9 % IV SOLN
1500.0000 mg | Freq: Once | INTRAVENOUS | Status: AC
  Administered 2020-09-27: 1500 mg via INTRAVENOUS
  Filled 2020-09-27: qty 30

## 2020-09-27 MED ORDER — SODIUM CHLORIDE 0.9 % IV SOLN
Freq: Once | INTRAVENOUS | Status: AC
  Filled 2020-09-27: qty 250

## 2020-09-27 MED ORDER — SODIUM CHLORIDE 0.9% FLUSH
10.0000 mL | INTRAVENOUS | Status: DC | PRN
  Administered 2020-09-27: 10 mL
  Filled 2020-09-27: qty 10

## 2020-09-27 MED ORDER — HEPARIN SOD (PORK) LOCK FLUSH 100 UNIT/ML IV SOLN
500.0000 [IU] | Freq: Once | INTRAVENOUS | Status: AC | PRN
  Administered 2020-09-27: 500 [IU]
  Filled 2020-09-27: qty 5

## 2020-09-27 NOTE — Progress Notes (Signed)
[facility] [phone]   Fax:([phone]  OFFICE PROGRESS NOTE  [name], MD [place] [address]  DIAGNOSIS: Stage IIIA (T4, N0, M0) non-small cell lung cancer, squamous cell carcinoma presented with large right upper lobe lung mass diagnosed in October 2021.  PRIOR THERAPY: Concurrent chemoradiation with weekly carboplatin for AUC of 2 and paclitaxel 45 mg/M2. Status post 6 cycles. First dose on 02/27/20.  Last dose was given April 02, 2020.  CURRENT THERAPY:  Consolidation treatment with immunotherapy with Imfinzi 1500 mg IV every 4 weeks.  First dose May 10, 2020.  Status post 5 cycles.  INTERVAL HISTORY: Billy Hall 75 y.o. male returns to the clinic today for follow-up visit.  The patient is feeling fine today with no concerning complaints except for the baseline shortness of breath increased with exertion.  He denied having any current chest pain, or hemoptysis but has dry cough.  He denied having any recent nausea, vomiting, diarrhea or constipation.  He has no skin rash.  He has no significant weight loss or night sweats.  He continues to tolerate his treatment with Imfinzi fairly well.  The patient is here today for evaluation before starting cycle #6 of his treatment.  MEDICAL HISTORY: Past Medical History:  Diagnosis Date  . Arthritis   . Depressive disorder   . Diabetes mellitus without complication ([place])   . Fatigue   . Headache(784.0)   . Hyperlipidemia   . Hypertension   . Hypothyroidism   . Snoring     ALLERGIES:  is allergic to ace inhibitors.  MEDICATIONS:  Current Outpatient Medications  Medication Sig Dispense Refill  . acetaminophen (TYLENOL) 650 MG CR tablet Take 650 mg by mouth every 8 (eight) hours as needed for pain.    Marland Kitchen aspirin EC 81 MG tablet Take 81 mg by mouth daily.    . cholecalciferol (VITAMIN D3) 25 MCG (1000 UNIT) tablet Take 1,000 Units by mouth daily.    Marland Kitchen  levothyroxine (SYNTHROID) 88 MCG tablet Take 88 mcg by mouth daily before breakfast.     . losartan (COZAAR) 50 MG tablet Take 50 mg by mouth daily.     . metFORMIN (GLUCOPHAGE) 500 MG tablet Take 500 mg by mouth 2 (two) times daily with a meal.     . ONETOUCH ULTRA test strip 1 each 2 (two) times daily.    . prochlorperazine (COMPAZINE) 10 MG tablet Take 1 tablet (10 mg total) by mouth every 6 (six) hours as needed for nausea or vomiting. (Patient not taking: Reported on 08/30/2020) 30 tablet 0  . sildenafil (REVATIO) 20 MG tablet Take 20 mg by mouth daily as needed (ED).     . simvastatin (ZOCOR) 10 MG tablet Take 10 mg by mouth every evening.     . sucralfate (CARAFATE) 1 g tablet CRUSH (1) TABLET IN 1oz OF WATER AND DRINK 5 MINUTES BEFORE MEALS AND AT BEDTIME 4 TIMES DAILY 120 tablet 0  . tamsulosin (FLOMAX) 0.4 MG CAPS capsule Take 0.4 mg by mouth daily.     No current facility-administered medications for this visit.    SURGICAL HISTORY:  Past Surgical History:  Procedure Laterality Date  . COLONOSCOPY N/A 03/08/2013   Procedure: COLONOSCOPY;  Surgeon: Jamesetta So, MD;  [facility];  Service: Gastroenterology;  Laterality: N/A;  . IR IMAGING GUIDED PORT INSERTION  03/08/2020  . IR US GUIDE BX ASP/DRAIN  03/08/2020  . KNEE  ARTHROSCOPY Right    2006 By Dr. Luna Glasgow at [facility].   . THYROID SURGERY    . VIDEO BRONCHOSCOPY WITH ENDOBRONCHIAL ULTRASOUND N/A 01/30/2020   Procedure: VIDEO BRONCHOSCOPY WITH ENDOBRONCHIAL ULTRASOUND;  Surgeon: Melrose Nakayama, MD;  Location: [facility];  Service: Thoracic;  Laterality: N/A;    REVIEW OF SYSTEMS:  A comprehensive review of systems was negative except for: Respiratory: positive for cough and dyspnea on exertion   PHYSICAL EXAMINATION: General appearance: alert, cooperative and no distress Head: Normocephalic, without obvious abnormality, atraumatic Neck: no adenopathy, no JVD, supple, symmetrical, trachea midline and thyroid not  enlarged, symmetric, no tenderness/mass/nodules Lymph nodes: Cervical, supraclavicular, and axillary nodes normal. Resp: clear to auscultation bilaterally Back: symmetric, no curvature. ROM normal. No CVA tenderness. Cardio: regular rate and rhythm, S1, S2 normal, no murmur, click, rub or gallop GI: soft, non-tender; bowel sounds normal; no masses,  no organomegaly Extremities: extremities normal, atraumatic, no cyanosis or edema  ECOG PERFORMANCE STATUS: 1 - Symptomatic but completely ambulatory  Blood pressure (!) 144/76, pulse 99, temperature (!) 96.6 F (35.9 C), temperature source Tympanic, resp. rate 20, height 5\' 9"  (1.753 m), weight 268 lb 11.2 oz (121.9 kg), SpO2 96 %.  LABORATORY DATA: Lab Results  Component Value Date   WBC 5.6 08/30/2020   HGB 12.8 (L) 08/30/2020   HCT 40.0 08/30/2020   MCV 76.5 (L) 08/30/2020   PLT 227 08/30/2020      Chemistry      Component Value Date/Time   NA 139 08/30/2020 0935   K 4.1 08/30/2020 0935   CL 103 08/30/2020 0935   CO2 28 08/30/2020 0935   BUN 11 08/30/2020 0935   CREATININE 0.80 08/30/2020 0935      Component Value Date/Time   CALCIUM 9.2 08/30/2020 0935   ALKPHOS 94 08/30/2020 0935   AST 17 08/30/2020 0935   ALT 20 08/30/2020 0935   BILITOT 0.4 08/30/2020 0935       RADIOGRAPHIC STUDIES: No results found.  ASSESSMENT AND PLAN: This is a very pleasant 75 years old African-American male recently diagnosed with a stage IIIA non-small cell lung cancer, squamous cell carcinoma presented with large right upper lobe lung mass in October 2021. The patient underwent a course of concurrent chemoradiation with weekly carboplatin and paclitaxel status post 6 cycles.  The patient tolerated this course of treatment fairly well except for mild odynophagia. Imaging studies after the induction phase of concurrent chemoradiation showed interval decrease in the size and significant central cavitation of the previously demonstrated right  upper lobe lung mass consistent with response to therapy. The patient is currently undergoing consolidation treatment with immunotherapy with Imfinzi 1500 mg IV every 4 weeks status post 5 cycles.   The patient continues to tolerate this treatment well with no concerning adverse effects. I recommended for him to proceed with cycle #6 today as planned. I will see him back for follow-up visit in 4 weeks for evaluation with repeat CT scan of the chest for restaging of his disease. The patient was advised to call immediately if he has any other concerning symptoms in the interval The patient voices understanding of current disease status and treatment options and is in agreement with the current care plan.  All questions were answered. The patient knows to call the clinic with any problems, questions or concerns. We can certainly see the patient much sooner if necessary.   Disclaimer: This note was dictated with voice recognition software. Similar sounding words can inadvertently  be transcribed and may not be corrected upon review.

## 2020-09-27 NOTE — Patient Instructions (Signed)
[facility]  Discharge Instructions: Thank you for choosing [facility] to provide your oncology and hematology care.   If you have a lab appointment with the Cancer Center, please go directly to the Cancer Center and check in at the registration area.   Wear comfortable clothing and clothing appropriate for easy access to any Portacath or PICC line.   We strive to give you quality time with your provider. You may need to reschedule your appointment if you arrive late (15 or more minutes).  Arriving late affects you and other patients whose appointments are after yours.  Also, if you miss three or more appointments without notifying the office, you may be dismissed from the clinic at the provider's discretion.      For prescription refill requests, have your pharmacy contact our office and allow 72 hours for refills to be completed.    Today you received the following chemotherapy and/or immunotherapy agents imfinzi       To help prevent nausea and vomiting after your treatment, we encourage you to take your nausea medication as directed.  BELOW ARE SYMPTOMS THAT SHOULD BE REPORTED IMMEDIATELY: *FEVER GREATER THAN 100.4 F (38 C) OR HIGHER *CHILLS OR SWEATING *NAUSEA AND VOMITING THAT IS NOT CONTROLLED WITH YOUR NAUSEA MEDICATION *UNUSUAL SHORTNESS OF BREATH *UNUSUAL BRUISING OR BLEEDING *URINARY PROBLEMS (pain or burning when urinating, or frequent urination) *BOWEL PROBLEMS (unusual diarrhea, constipation, pain near the anus) TENDERNESS IN MOUTH AND THROAT WITH OR WITHOUT PRESENCE OF ULCERS (sore throat, sores in mouth, or a toothache) UNUSUAL RASH, SWELLING OR PAIN  UNUSUAL VAGINAL DISCHARGE OR ITCHING   Items with * indicate a potential emergency and should be followed up as soon as possible or go to the Emergency Department if any problems should occur.  Please show the CHEMOTHERAPY ALERT CARD or IMMUNOTHERAPY ALERT CARD at check-in to  the Emergency Department and triage nurse.  Should you have questions after your visit or need to cancel or reschedule your appointment, please contact [facility]  Dept: [phone]  and follow the prompts.  Office hours are 8:00 a.m. to 4:30 p.m. Monday - Friday. Please note that voicemails left after 4:00 p.m. may not be returned until the following business day.  We are closed weekends and major holidays. You have access to a nurse at all times for urgent questions. Please call the main number to the clinic Dept: [phone] and follow the prompts.   For any non-urgent questions, you may also contact your provider using MyChart. We now offer e-Visits for anyone 18 and older to request care online for non-urgent symptoms. For details visit [email or web address].   Also download the MyChart app! Go to the app store, search "MyChart", open the app, select [facility], and log in with your MyChart username and password.  Due to Covid, a mask is required upon entering the hospital/clinic. If you do not have a mask, one will be given to you upon arrival. For doctor visits, patients may have 1 support person aged 18 or older with them. For treatment visits, patients cannot have anyone with them due to current Covid guidelines and our immunocompromised population.   

## 2020-10-02 ENCOUNTER — Telehealth: Payer: Self-pay | Admitting: Internal Medicine

## 2020-10-02 NOTE — Telephone Encounter (Signed)
Scheduled per los. Called and left msg. Mailed printout  °

## 2020-10-15 DIAGNOSIS — E559 Vitamin D deficiency, unspecified: Secondary | ICD-10-CM | POA: Diagnosis not present

## 2020-10-15 DIAGNOSIS — E782 Mixed hyperlipidemia: Secondary | ICD-10-CM | POA: Diagnosis not present

## 2020-10-15 DIAGNOSIS — C3411 Malignant neoplasm of upper lobe, right bronchus or lung: Secondary | ICD-10-CM | POA: Diagnosis not present

## 2020-10-15 DIAGNOSIS — E039 Hypothyroidism, unspecified: Secondary | ICD-10-CM | POA: Diagnosis not present

## 2020-10-15 DIAGNOSIS — E118 Type 2 diabetes mellitus with unspecified complications: Secondary | ICD-10-CM | POA: Diagnosis not present

## 2020-10-15 DIAGNOSIS — E7849 Other hyperlipidemia: Secondary | ICD-10-CM | POA: Diagnosis not present

## 2020-10-23 ENCOUNTER — Other Ambulatory Visit: Payer: Self-pay

## 2020-10-23 ENCOUNTER — Ambulatory Visit (HOSPITAL_COMMUNITY)
Admission: RE | Admit: 2020-10-23 | Discharge: 2020-10-23 | Disposition: A | Discharge status: Home / Self Care | Payer: Medicare Other | Source: Ambulatory Visit | Attending: Internal Medicine | Admitting: Internal Medicine

## 2020-10-23 DIAGNOSIS — C3490 Malignant neoplasm of unspecified part of unspecified bronchus or lung: Secondary | ICD-10-CM

## 2020-10-23 DIAGNOSIS — J90 Pleural effusion, not elsewhere classified: Secondary | ICD-10-CM | POA: Diagnosis not present

## 2020-10-23 DIAGNOSIS — I700 Atherosclerosis of aorta: Secondary | ICD-10-CM | POA: Diagnosis not present

## 2020-10-23 MED ORDER — IOHEXOL 300 MG/ML  SOLN
100.0000 mL | Freq: Once | INTRAMUSCULAR | Status: AC | PRN
  Administered 2020-10-23: 75 mL via INTRAVENOUS

## 2020-10-23 MED ORDER — HEPARIN SOD (PORK) LOCK FLUSH 100 UNIT/ML IV SOLN: 500.0000 [IU] | Freq: Once | INTRAVENOUS | Status: DC

## 2020-10-23 MED ORDER — SODIUM CHLORIDE (PF) 0.9 % IJ SOLN
INTRAMUSCULAR | Status: AC
  Filled 2020-10-23: qty 50

## 2020-10-23 MED ORDER — HEPARIN SOD (PORK) LOCK FLUSH 100 UNIT/ML IV SOLN
INTRAVENOUS | Status: AC
  Administered 2020-10-23: 500 [IU]
  Filled 2020-10-23: qty 5

## 2020-10-25 ENCOUNTER — Encounter: Payer: Self-pay | Admitting: Internal Medicine

## 2020-10-25 ENCOUNTER — Other Ambulatory Visit: Payer: Self-pay

## 2020-10-25 ENCOUNTER — Inpatient Hospital Stay: Payer: Medicare Other

## 2020-10-25 ENCOUNTER — Inpatient Hospital Stay (HOSPITAL_BASED_OUTPATIENT_CLINIC_OR_DEPARTMENT_OTHER): Payer: Medicare Other | Admitting: Internal Medicine

## 2020-10-25 VITALS — BP 145/79 | HR 74 | Temp 96.6°F | Resp 20 | Ht 69.0 in | Wt 267.9 lb

## 2020-10-25 DIAGNOSIS — Z923 Personal history of irradiation: Secondary | ICD-10-CM | POA: Diagnosis not present

## 2020-10-25 DIAGNOSIS — R0609 Other forms of dyspnea: Secondary | ICD-10-CM | POA: Diagnosis not present

## 2020-10-25 DIAGNOSIS — C3411 Malignant neoplasm of upper lobe, right bronchus or lung: Secondary | ICD-10-CM | POA: Diagnosis not present

## 2020-10-25 DIAGNOSIS — J90 Pleural effusion, not elsewhere classified: Secondary | ICD-10-CM | POA: Diagnosis not present

## 2020-10-25 DIAGNOSIS — R0602 Shortness of breath: Secondary | ICD-10-CM | POA: Diagnosis not present

## 2020-10-25 DIAGNOSIS — N281 Cyst of kidney, acquired: Secondary | ICD-10-CM | POA: Diagnosis not present

## 2020-10-25 DIAGNOSIS — M47814 Spondylosis without myelopathy or radiculopathy, thoracic region: Secondary | ICD-10-CM | POA: Diagnosis not present

## 2020-10-25 DIAGNOSIS — R5383 Other fatigue: Secondary | ICD-10-CM | POA: Diagnosis not present

## 2020-10-25 DIAGNOSIS — R1310 Dysphagia, unspecified: Secondary | ICD-10-CM | POA: Diagnosis not present

## 2020-10-25 DIAGNOSIS — Z79899 Other long term (current) drug therapy: Secondary | ICD-10-CM | POA: Diagnosis not present

## 2020-10-25 DIAGNOSIS — Z5112 Encounter for antineoplastic immunotherapy: Secondary | ICD-10-CM

## 2020-10-25 DIAGNOSIS — R058 Other specified cough: Secondary | ICD-10-CM | POA: Diagnosis not present

## 2020-10-25 DIAGNOSIS — Z9221 Personal history of antineoplastic chemotherapy: Secondary | ICD-10-CM | POA: Diagnosis not present

## 2020-10-25 DIAGNOSIS — I700 Atherosclerosis of aorta: Secondary | ICD-10-CM | POA: Diagnosis not present

## 2020-10-25 LAB — CMP (CANCER CENTER ONLY)
ALT: 9 U/L (ref 0–44)
AST: 15 U/L (ref 15–41)
Albumin: 3.3 g/dL — ABNORMAL LOW (ref 3.5–5.0)
Alkaline Phosphatase: 63 U/L (ref 38–126)
Anion gap: 7 (ref 5–15)
BUN: 10 mg/dL (ref 8–23)
CO2: 26 mmol/L (ref 22–32)
Calcium: 8.8 mg/dL — ABNORMAL LOW (ref 8.9–10.3)
Chloride: 105 mmol/L (ref 98–111)
Creatinine: 0.75 mg/dL (ref 0.61–1.24)
GFR, Estimated: >60 mL/min (ref >60)
Glucose, Bld: 111 mg/dL — ABNORMAL HIGH (ref 70–99)
Potassium: 4 mmol/L (ref 3.5–5.1)
Sodium: 138 mmol/L (ref 135–145)
Total Bilirubin: 0.8 mg/dL (ref 0.3–1.2)
Total Protein: 7 g/dL (ref 6.5–8.1)

## 2020-10-25 LAB — CBC WITH DIFFERENTIAL (CANCER CENTER ONLY)
Abs Immature Granulocytes: 0 10*3/uL (ref 0.00–0.07)
Basophils Absolute: 0 10*3/uL (ref 0.0–0.1)
Basophils Relative: 1 %
Eosinophils Absolute: 0.1 10*3/uL (ref 0.0–0.5)
Eosinophils Relative: 2 %
HCT: 38.4 % — ABNORMAL LOW (ref 39.0–52.0)
Hemoglobin: 12.6 g/dL — ABNORMAL LOW (ref 13.0–17.0)
Immature Granulocytes: 0 %
Lymphocytes Relative: 16 %
Lymphs Abs: 0.7 10*3/uL (ref 0.7–4.0)
MCH: 25.8 pg — ABNORMAL LOW (ref 26.0–34.0)
MCHC: 32.8 g/dL (ref 30.0–36.0)
MCV: 78.7 fL — ABNORMAL LOW (ref 80.0–100.0)
Monocytes Absolute: 0.7 10*3/uL (ref 0.1–1.0)
Monocytes Relative: 15 %
Neutro Abs: 3 10*3/uL (ref 1.7–7.7)
Neutrophils Relative %: 66 %
Platelet Count: 178 10*3/uL (ref 150–400)
RBC: 4.88 MIL/uL (ref 4.22–5.81)
RDW: 18.6 % — ABNORMAL HIGH (ref 11.5–15.5)
WBC Count: 4.4 10*3/uL (ref 4.0–10.5)
nRBC: 0 % (ref 0.0–0.2)

## 2020-10-25 LAB — TSH: TSH: 4.778 u[IU]/mL — ABNORMAL HIGH (ref 0.320–4.118)

## 2020-10-25 MED ORDER — SODIUM CHLORIDE 0.9 % IV SOLN
1500.0000 mg | Freq: Once | INTRAVENOUS | Status: AC
  Administered 2020-10-25: 1500 mg via INTRAVENOUS
  Filled 2020-10-25: qty 30

## 2020-10-25 MED ORDER — SODIUM CHLORIDE 0.9% FLUSH
10.0000 mL | INTRAVENOUS | Status: DC | PRN
  Administered 2020-10-25: 10 mL
  Filled 2020-10-25: qty 10

## 2020-10-25 MED ORDER — SODIUM CHLORIDE 0.9 % IV SOLN
Freq: Once | INTRAVENOUS | Status: AC
  Filled 2020-10-25: qty 250

## 2020-10-25 MED ORDER — HEPARIN SOD (PORK) LOCK FLUSH 100 UNIT/ML IV SOLN
500.0000 [IU] | Freq: Once | INTRAVENOUS | Status: AC | PRN
  Administered 2020-10-25: 500 [IU]
  Filled 2020-10-25: qty 5

## 2020-10-25 NOTE — Progress Notes (Signed)
[facility] [phone]   Fax:([phone]  OFFICE PROGRESS NOTE  [name], MD [place] [address]  DIAGNOSIS: Stage IIIA (T4, N0, M0) non-small cell lung cancer, squamous cell carcinoma presented with large right upper lobe lung mass diagnosed in October 2021.   PRIOR THERAPY: Concurrent chemoradiation with weekly carboplatin for AUC of 2 and paclitaxel 45 mg/M2. Status post 6 cycles. First dose on 02/27/20.  Last dose was given April 02, 2020.   CURRENT THERAPY:  Consolidation treatment with immunotherapy with Imfinzi 1500 mg IV every 4 weeks.  First dose May 10, 2020.  Status post 6 cycles.  INTERVAL HISTORY: REN ASPINALL 75 y.o. male returns to the clinic today for follow-up visit.  The patient is feeling fine today with no concerning complaints except for mild fatigue and cough productive of clear sputum.  He denied having any chest pain, shortness of breath or hemoptysis.  He denied having any fever or chills.  He has no nausea, vomiting, diarrhea or constipation.  He has no headache or visual changes.  The patient continues to tolerate his treatment with immunotherapy with Imfinzi fairly well.  He had repeat CT scan of the chest performed recently and he is here for evaluation and discussion of his scan results.  MEDICAL HISTORY: Past Medical History:  Diagnosis Date   Arthritis    Depressive disorder    Diabetes mellitus without complication ([facility])    Fatigue    Headache(784.0)    Hyperlipidemia    Hypertension    Hypothyroidism    Snoring     ALLERGIES:  is allergic to ace inhibitors.  MEDICATIONS:  Current Outpatient Medications  Medication Sig Dispense Refill   acetaminophen (TYLENOL) 650 MG CR tablet Take 650 mg by mouth every 8 (eight) hours as needed for pain.     aspirin EC 81 MG tablet Take 81 mg by mouth daily.     cholecalciferol (VITAMIN D3) 25 MCG (1000 UNIT) tablet Take 1,000 Units by  mouth daily.     levothyroxine (SYNTHROID) 88 MCG tablet Take 88 mcg by mouth daily before breakfast.      losartan (COZAAR) 50 MG tablet Take 50 mg by mouth daily.      metFORMIN (GLUCOPHAGE) 500 MG tablet Take 500 mg by mouth 2 (two) times daily with a meal.      ONETOUCH ULTRA test strip 1 each 2 (two) times daily.     sildenafil (REVATIO) 20 MG tablet Take 20 mg by mouth daily as needed (ED).      simvastatin (ZOCOR) 10 MG tablet Take 10 mg by mouth every evening.      sucralfate (CARAFATE) 1 g tablet CRUSH (1) TABLET IN 1oz OF WATER AND DRINK 5 MINUTES BEFORE MEALS AND AT BEDTIME 4 TIMES DAILY 120 tablet 0   tamsulosin (FLOMAX) 0.4 MG CAPS capsule Take 0.4 mg by mouth daily.     prochlorperazine (COMPAZINE) 10 MG tablet Take 1 tablet (10 mg total) by mouth every 6 (six) hours as needed for nausea or vomiting. (Patient not taking: Reported on 10/25/2020) 30 tablet 0   No current facility-administered medications for this visit.    SURGICAL HISTORY:  Past Surgical History:  Procedure Laterality Date   COLONOSCOPY N/A 03/08/2013   Procedure: COLONOSCOPY;  Surgeon: Jamesetta So, MD;  [facility];  Service: Gastroenterology;  Laterality: N/A;   IR IMAGING GUIDED PORT INSERTION  03/08/2020   IR  US GUIDE BX ASP/DRAIN  03/08/2020   KNEE ARTHROSCOPY Right    2006 By Dr. Luna Glasgow at [facility].    THYROID SURGERY     VIDEO BRONCHOSCOPY WITH ENDOBRONCHIAL ULTRASOUND N/A 01/30/2020   Procedure: VIDEO BRONCHOSCOPY WITH ENDOBRONCHIAL ULTRASOUND;  Surgeon: Melrose Nakayama, MD;  Location: [facility];  Service: Thoracic;  Laterality: N/A;    REVIEW OF SYSTEMS:  Constitutional: positive for fatigue Eyes: negative Ears, nose, mouth, throat, and face: negative Respiratory: positive for cough Cardiovascular: negative Gastrointestinal: negative Genitourinary:negative Integument/breast: negative Hematologic/lymphatic: negative Musculoskeletal:negative Neurological:  negative Behavioral/Psych: negative Endocrine: negative Allergic/Immunologic: negative   PHYSICAL EXAMINATION: General appearance: alert, cooperative, and no distress Head: Normocephalic, without obvious abnormality, atraumatic Neck: no adenopathy, no JVD, supple, symmetrical, trachea midline, and thyroid not enlarged, symmetric, no tenderness/mass/nodules Lymph nodes: Cervical, supraclavicular, and axillary nodes normal. Resp: clear to auscultation bilaterally Back: symmetric, no curvature. ROM normal. No CVA tenderness. Cardio: regular rate and rhythm, S1, S2 normal, no murmur, click, rub or gallop GI: soft, non-tender; bowel sounds normal; no masses,  no organomegaly Extremities: extremities normal, atraumatic, no cyanosis or edema Neurologic: Alert and oriented X 3, normal strength and tone. Normal symmetric reflexes. Normal coordination and gait  ECOG PERFORMANCE STATUS: 1 - Symptomatic but completely ambulatory  Blood pressure (!) 145/79, pulse 74, temperature (!) 96.6 F (35.9 C), temperature source Tympanic, resp. rate 20, height 5\' 9"  (1.753 m), weight 267 lb 14.4 oz (121.5 kg), SpO2 97 %.  LABORATORY DATA: Lab Results  Component Value Date   WBC 4.4 10/25/2020   HGB 12.6 (L) 10/25/2020   HCT 38.4 (L) 10/25/2020   MCV 78.7 (L) 10/25/2020   PLT 178 10/25/2020      Chemistry      Component Value Date/Time   NA 138 10/25/2020 0807   K 4.0 10/25/2020 0807   CL 105 10/25/2020 0807   CO2 26 10/25/2020 0807   BUN 10 10/25/2020 0807   CREATININE 0.75 10/25/2020 0807      Component Value Date/Time   CALCIUM 8.8 (L) 10/25/2020 0807   ALKPHOS 63 10/25/2020 0807   AST 15 10/25/2020 0807   ALT 9 10/25/2020 0807   BILITOT 0.8 10/25/2020 0807       RADIOGRAPHIC STUDIES: CT Chest W Contrast  Result Date: 10/24/2020 CLINICAL DATA:  Non-small cell lung cancer.  Restaging. EXAM: CT CHEST WITH CONTRAST TECHNIQUE: Multidetector CT imaging of the chest was performed during  intravenous contrast administration. CONTRAST:  28mL OMNIPAQUE IOHEXOL 300 MG/ML  SOLN COMPARISON:  07/31/2020 FINDINGS: Cardiovascular: The heart size appears normal. Aortic atherosclerosis. Increase caliber of the main pulmonary artery consistent with PA hypertension. Similar appearance of chronic, partially thrombosed right upper lobe pulmonary artery, image 95/5. Mediastinum/Nodes: No enlarged axillary, supraclavicular, mediastinal, or hilar lymph nodes. No thyroid nodule. The trachea appears patent and is midline. Normal appearance of the esophagus. Lungs/Pleura: There is a small right pleural effusion, similar in volume to the previous exam. Right upper lobe cavity is again noted and appears similar to previous exam measuring 6.1 x 2.3 cm. Surrounding pleural thickening appears similar. Paramediastinal radiation change within the right lung is also unchanged when compared with the previous exam. No suspicious nodule identified to suggest tumor recurrence. Upper Abdomen: No acute abnormality. Cyst within upper pole of right kidney is again noted measuring 1.6 cm. Calcified granuloma noted within the posterior dome of right hepatic lobe. Musculoskeletal: Multilevel spondylosis identified within the thoracic spine. No acute or suspicious osseous findings. IMPRESSION: 1. Stable  CT of the chest. No findings to suggest recurrent tumor or metastatic disease. 2. Stable appearance of right upper lobe cavitary process with surrounding changes of external beam radiation. 3. Similar appearance of chronic, partially thrombosed right upper lobe pulmonary artery. 4. Small right pleural effusion. 5. Aortic atherosclerosis. Aortic Atherosclerosis (ICD10-I70.0). Electronically Signed   By: Kerby Moors M.D.   On: 10/24/2020 07:01    ASSESSMENT AND PLAN: This is a very pleasant 74 years old African-American male recently diagnosed with a stage IIIA non-small cell lung cancer, squamous cell carcinoma presented with large  right upper lobe lung mass in October 2021. The patient underwent a course of concurrent chemoradiation with weekly carboplatin and paclitaxel status post 6 cycles.  The patient tolerated this course of treatment fairly well except for mild odynophagia. Imaging studies after the induction phase of concurrent chemoradiation showed interval decrease in the size and significant central cavitation of the previously demonstrated right upper lobe lung mass consistent with response to therapy. The patient is currently undergoing consolidation treatment with immunotherapy with Imfinzi 1500 mg IV every 4 weeks status post 6 cycles.   The patient continues to tolerate his treatment well with no concerning adverse effect except for mild fatigue. He had repeat CT scan of the chest performed recently.  I personally and independently reviewed the scan and discussed the results with the patient today. His scan showed no concerning findings for disease progression. I recommended for the patient to continue his current treatment with Imfinzi and he will proceed with cycle #7 today as planned. He will come back for follow-up visit in 4 weeks for evaluation before the next cycle of his treatment. For the hypertension he was advised to take his blood pressure medication as prescribed and to monitor it closely at home. The patient was advised to call immediately if he has any concerning symptoms in the interval. The patient voices understanding of current disease status and treatment options and is in agreement with the current care plan.  All questions were answered. The patient knows to call the clinic with any problems, questions or concerns. We can certainly see the patient much sooner if necessary.   Disclaimer: This note was dictated with voice recognition software. Similar sounding words can inadvertently be transcribed and may not be corrected upon review.

## 2020-10-25 NOTE — Patient Instructions (Signed)
[facility]  Discharge Instructions: Thank you for choosing [facility] to provide your oncology and hematology care.   If you have a lab appointment with the Cancer Center, please go directly to the Cancer Center and check in at the registration area.   Wear comfortable clothing and clothing appropriate for easy access to any Portacath or PICC line.   We strive to give you quality time with your provider. You may need to reschedule your appointment if you arrive late (15 or more minutes).  Arriving late affects you and other patients whose appointments are after yours.  Also, if you miss three or more appointments without notifying the office, you may be dismissed from the clinic at the provider's discretion.      For prescription refill requests, have your pharmacy contact our office and allow 72 hours for refills to be completed.    Today you received the following chemotherapy and/or immunotherapy agents imfinzi       To help prevent nausea and vomiting after your treatment, we encourage you to take your nausea medication as directed.  BELOW ARE SYMPTOMS THAT SHOULD BE REPORTED IMMEDIATELY: *FEVER GREATER THAN 100.4 F (38 C) OR HIGHER *CHILLS OR SWEATING *NAUSEA AND VOMITING THAT IS NOT CONTROLLED WITH YOUR NAUSEA MEDICATION *UNUSUAL SHORTNESS OF BREATH *UNUSUAL BRUISING OR BLEEDING *URINARY PROBLEMS (pain or burning when urinating, or frequent urination) *BOWEL PROBLEMS (unusual diarrhea, constipation, pain near the anus) TENDERNESS IN MOUTH AND THROAT WITH OR WITHOUT PRESENCE OF ULCERS (sore throat, sores in mouth, or a toothache) UNUSUAL RASH, SWELLING OR PAIN  UNUSUAL VAGINAL DISCHARGE OR ITCHING   Items with * indicate a potential emergency and should be followed up as soon as possible or go to the Emergency Department if any problems should occur.  Please show the CHEMOTHERAPY ALERT CARD or IMMUNOTHERAPY ALERT CARD at check-in to  the Emergency Department and triage nurse.  Should you have questions after your visit or need to cancel or reschedule your appointment, please contact [facility]  Dept: [phone]  and follow the prompts.  Office hours are 8:00 a.m. to 4:30 p.m. Monday - Friday. Please note that voicemails left after 4:00 p.m. may not be returned until the following business day.  We are closed weekends and major holidays. You have access to a nurse at all times for urgent questions. Please call the main number to the clinic Dept: [phone] and follow the prompts.   For any non-urgent questions, you may also contact your provider using MyChart. We now offer e-Visits for anyone 18 and older to request care online for non-urgent symptoms. For details visit [email or web address].   Also download the MyChart app! Go to the app store, search "MyChart", open the app, select , and log in with your MyChart username and password.  Due to Covid, a mask is required upon entering the hospital/clinic. If you do not have a mask, one will be given to you upon arrival. For doctor visits, patients may have 1 support person aged 18 or older with them. For treatment visits, patients cannot have anyone with them due to current Covid guidelines and our immunocompromised population.   

## 2020-11-15 NOTE — Progress Notes (Signed)
[place] OFFICE PROGRESS NOTE  [name], MD [place] [address]  DIAGNOSIS:  Stage IIIA (T4, N0, M0) non-small cell lung cancer, squamous cell carcinoma presented with large right upper lobe lung mass diagnosed in October 2021.  PRIOR THERAPY: Concurrent chemoradiation with weekly carboplatin for AUC of 2 and paclitaxel 45 mg/M2. Status post 6 cycles. First dose on 02/27/20.  Last dose was given April 02, 2020.  CURRENT THERAPY:  Consolidation treatment with immunotherapy with Imfinzi 1500 mg IV every 4 weeks.  First dose May 10, 2020.  Status post 7 cycles.  INTERVAL HISTORY: Billy Hall 75 y.o. male returns to the clinic today for a follow-up visit.  The patient is feeling  fairly well today without any concerning complaints. The patient denies any recent fever, chills, weight loss, or night sweats.  He reports his baseline cough and dyspnea on exertion which he states is unchanged since his last appointment. He denies any chest pain or hemoptysis.  He denies any nausea, vomiting, diarrhea, or constipation.  He denies any headache or visual changes.  He denies any rashes or skin changes.  The patient is here today for evaluation before starting cycle #8.     MEDICAL HISTORY: Past Medical History:  Diagnosis Date   Arthritis    Depressive disorder    Diabetes mellitus without complication (HCC)    Fatigue    Headache(784.0)    Hyperlipidemia    Hypertension    Hypothyroidism    Snoring     ALLERGIES:  is allergic to ace inhibitors.  MEDICATIONS:  Current Outpatient Medications  Medication Sig Dispense Refill   acetaminophen (TYLENOL) 650 MG CR tablet Take 650 mg by mouth every 8 (eight) hours as needed for pain.     aspirin EC 81 MG tablet Take 81 mg by mouth daily.     cholecalciferol (VITAMIN D3) 25 MCG (1000 UNIT) tablet Take 1,000 Units by mouth daily.     levothyroxine (SYNTHROID) 88 MCG tablet Take 88 mcg by  mouth daily before breakfast.      losartan (COZAAR) 50 MG tablet Take 50 mg by mouth daily.      metFORMIN (GLUCOPHAGE) 500 MG tablet Take 500 mg by mouth 2 (two) times daily with a meal.      ONETOUCH ULTRA test strip 1 each 2 (two) times daily.     prochlorperazine (COMPAZINE) 10 MG tablet Take 1 tablet (10 mg total) by mouth every 6 (six) hours as needed for nausea or vomiting. (Patient not taking: Reported on 10/25/2020) 30 tablet 0   sildenafil (REVATIO) 20 MG tablet Take 20 mg by mouth daily as needed (ED).      simvastatin (ZOCOR) 10 MG tablet Take 10 mg by mouth every evening.      sucralfate (CARAFATE) 1 g tablet CRUSH (1) TABLET IN 1oz OF WATER AND DRINK 5 MINUTES BEFORE MEALS AND AT BEDTIME 4 TIMES DAILY 120 tablet 0   tamsulosin (FLOMAX) 0.4 MG CAPS capsule Take 0.4 mg by mouth daily.     No current facility-administered medications for this visit.    SURGICAL HISTORY:  Past Surgical History:  Procedure Laterality Date   COLONOSCOPY N/A 03/08/2013   Procedure: COLONOSCOPY;  Surgeon: Jamesetta So, MD;  [facility];  Service: Gastroenterology;  Laterality: N/A;   IR IMAGING GUIDED PORT INSERTION  03/08/2020   IR US GUIDE BX ASP/DRAIN  03/08/2020   KNEE ARTHROSCOPY Right    2006  By Dr. Luna Glasgow at [facility].    THYROID SURGERY     VIDEO BRONCHOSCOPY WITH ENDOBRONCHIAL ULTRASOUND N/A 01/30/2020   Procedure: VIDEO BRONCHOSCOPY WITH ENDOBRONCHIAL ULTRASOUND;  Surgeon: Melrose Nakayama, MD;  Location: [facility] OR;  Service: Thoracic;  Laterality: N/A;    REVIEW OF SYSTEMS:   Review of Systems  Constitutional: Negative for appetite change, chills, fatigue, fever and unexpected weight change.  HENT:   Negative for mouth sores, nosebleeds, sore throat and trouble swallowing.   Eyes: Negative for eye problems and icterus.  Respiratory: Positive for baseline dyspnea on exertion and cough. Negative for hemoptysis and wheezing.   Cardiovascular: Negative for chest pain and leg  swelling.  Gastrointestinal: Negative for abdominal pain, constipation, diarrhea, nausea and vomiting.  Genitourinary: Negative for bladder incontinence, difficulty urinating, dysuria, frequency and hematuria.   Musculoskeletal: Negative for back pain, gait problem, neck pain and neck stiffness.  Skin: Negative for itching and rash.  Neurological: Negative for dizziness, extremity weakness, gait problem, headaches, light-headedness and seizures.  Hematological: Negative for adenopathy. Does not bruise/bleed easily.  Psychiatric/Behavioral: Negative for confusion, depression and sleep disturbance. The patient is not nervous/anxious.     PHYSICAL EXAMINATION:  Blood pressure 124/72, pulse 81, temperature 98.7 F (37.1 C), temperature source Oral, resp. rate 17, weight 264 lb 8 oz (120 kg), SpO2 98 %.  ECOG PERFORMANCE STATUS: 1  Physical Exam  Constitutional: Oriented to person, place, and time and well-developed, well-nourished, and in no distress. HENT: Head: Normocephalic and atraumatic. Mouth/Throat: Oropharynx is clear and moist. No oropharyngeal exudate. Eyes: Conjunctivae are normal. Right eye exhibits no discharge. Left eye exhibits no discharge. No scleral icterus. Neck: Normal range of motion. Neck supple. Cardiovascular: Normal rate, regular rhythm, normal heart sounds and intact distal pulses.   Pulmonary/Chest: Effort normal and breath sounds normal. No respiratory distress. No wheezes. No rales. Abdominal: Soft. Bowel sounds are normal. Exhibits no distension and no mass. There is no tenderness.  Musculoskeletal: Normal range of motion. Exhibits no edema.  Lymphadenopathy:    No cervical adenopathy.  Neurological: Alert and oriented to person, place, and time. Exhibits normal muscle tone. Gait normal. Coordination normal. Skin: Skin is warm and dry. No rash noted. Not diaphoretic. No erythema. No pallor.  Psychiatric: Mood, memory and judgment normal. Vitals  reviewed.  LABORATORY DATA: Lab Results  Component Value Date   WBC 6.0 11/22/2020   HGB 12.7 (L) 11/22/2020   HCT 38.7 (L) 11/22/2020   MCV 81.3 11/22/2020   PLT 182 11/22/2020      Chemistry      Component Value Date/Time   NA 139 11/22/2020 1003   K 4.1 11/22/2020 1003   CL 103 11/22/2020 1003   CO2 29 11/22/2020 1003   BUN 15 11/22/2020 1003   CREATININE 0.84 11/22/2020 1003      Component Value Date/Time   CALCIUM 9.4 11/22/2020 1003   ALKPHOS 71 11/22/2020 1003   AST 10 (L) 11/22/2020 1003   ALT 8 11/22/2020 1003   BILITOT 0.5 11/22/2020 1003       RADIOGRAPHIC STUDIES:  CT Chest W Contrast  Result Date: 10/24/2020 CLINICAL DATA:  Non-small cell lung cancer.  Restaging. EXAM: CT CHEST WITH CONTRAST TECHNIQUE: Multidetector CT imaging of the chest was performed during intravenous contrast administration. CONTRAST:  27mL OMNIPAQUE IOHEXOL 300 MG/ML  SOLN COMPARISON:  07/31/2020 FINDINGS: Cardiovascular: The heart size appears normal. Aortic atherosclerosis. Increase caliber of the main pulmonary artery consistent with PA hypertension. Similar appearance of  chronic, partially thrombosed right upper lobe pulmonary artery, image 95/5. Mediastinum/Nodes: No enlarged axillary, supraclavicular, mediastinal, or hilar lymph nodes. No thyroid nodule. The trachea appears patent and is midline. Normal appearance of the esophagus. Lungs/Pleura: There is a small right pleural effusion, similar in volume to the previous exam. Right upper lobe cavity is again noted and appears similar to previous exam measuring 6.1 x 2.3 cm. Surrounding pleural thickening appears similar. Paramediastinal radiation change within the right lung is also unchanged when compared with the previous exam. No suspicious nodule identified to suggest tumor recurrence. Upper Abdomen: No acute abnormality. Cyst within upper pole of right kidney is again noted measuring 1.6 cm. Calcified granuloma noted within the  posterior dome of right hepatic lobe. Musculoskeletal: Multilevel spondylosis identified within the thoracic spine. No acute or suspicious osseous findings. IMPRESSION: 1. Stable CT of the chest. No findings to suggest recurrent tumor or metastatic disease. 2. Stable appearance of right upper lobe cavitary process with surrounding changes of external beam radiation. 3. Similar appearance of chronic, partially thrombosed right upper lobe pulmonary artery. 4. Small right pleural effusion. 5. Aortic atherosclerosis. Aortic Atherosclerosis (ICD10-I70.0). Electronically Signed   By: Kerby Moors M.D.   On: 10/24/2020 07:01     ASSESSMENT/PLAN:  This is a very pleasant 75 year old African-American male diagnosed with stage IIIa non-small cell lung cancer, squamous cell carcinoma.  He presented with a large right upper lobe lung mass in October 2021.  The patient underwent a course of concurrent chemoradiation with weekly carboplatin for an AUC of 2 and paclitaxel 45 mg per metered squared.  He is status post 6 cycles.  The patient tolerated this treatment well except for mild odynophagia.  The patient is currently undergoing consolidation treatment with immunotherapy with Imfinzi 1500 mg IV every 4 weeks.  He is status post 7 cycles of treatment.   Labs are reviewed.  Recommend that he proceed with cycle #8 today scheduled.  We will see him back for follow-up visit in 4 weeks for evaluation before starting cycle #9.   The patient was advised to call immediately if he has any concerning symptoms in the interval. The patient voices understanding of current disease status and treatment options and is in agreement with the current care plan. All questions were answered. The patient knows to call the clinic with any problems, questions or concerns. We can certainly see the patient much sooner if necessary        Orders Placed This Encounter  Procedures   CMP ([facility] only)    Standing  Status:   Standing    Number of Occurrences:   5    Standing Expiration Date:   11/22/2021     The total time spent in the appointment was 20-29 minutes.   Ki Luckman L Ylianna Almanzar, PA-C 11/22/20

## 2020-11-22 ENCOUNTER — Inpatient Hospital Stay: Payer: Medicare Other | Attending: Internal Medicine | Admitting: Physician Assistant

## 2020-11-22 ENCOUNTER — Inpatient Hospital Stay: Payer: Medicare Other

## 2020-11-22 ENCOUNTER — Encounter: Payer: Self-pay | Admitting: Physician Assistant

## 2020-11-22 ENCOUNTER — Other Ambulatory Visit: Payer: Self-pay

## 2020-11-22 VITALS — BP 124/72 | HR 81 | Temp 98.7°F | Resp 17 | Wt 264.5 lb

## 2020-11-22 DIAGNOSIS — R059 Cough, unspecified: Secondary | ICD-10-CM | POA: Insufficient documentation

## 2020-11-22 DIAGNOSIS — I700 Atherosclerosis of aorta: Secondary | ICD-10-CM | POA: Diagnosis not present

## 2020-11-22 DIAGNOSIS — R1310 Dysphagia, unspecified: Secondary | ICD-10-CM | POA: Diagnosis not present

## 2020-11-22 DIAGNOSIS — Z79899 Other long term (current) drug therapy: Secondary | ICD-10-CM | POA: Insufficient documentation

## 2020-11-22 DIAGNOSIS — M47814 Spondylosis without myelopathy or radiculopathy, thoracic region: Secondary | ICD-10-CM | POA: Insufficient documentation

## 2020-11-22 DIAGNOSIS — J90 Pleural effusion, not elsewhere classified: Secondary | ICD-10-CM | POA: Diagnosis not present

## 2020-11-22 DIAGNOSIS — R0609 Other forms of dyspnea: Secondary | ICD-10-CM | POA: Diagnosis not present

## 2020-11-22 DIAGNOSIS — N281 Cyst of kidney, acquired: Secondary | ICD-10-CM | POA: Diagnosis not present

## 2020-11-22 DIAGNOSIS — C3411 Malignant neoplasm of upper lobe, right bronchus or lung: Secondary | ICD-10-CM

## 2020-11-22 DIAGNOSIS — Z5112 Encounter for antineoplastic immunotherapy: Secondary | ICD-10-CM | POA: Diagnosis not present

## 2020-11-22 DIAGNOSIS — Z95828 Presence of other vascular implants and grafts: Secondary | ICD-10-CM

## 2020-11-22 LAB — CBC WITH DIFFERENTIAL (CANCER CENTER ONLY)
Abs Immature Granulocytes: 0.02 10*3/uL (ref 0.00–0.07)
Basophils Absolute: 0 10*3/uL (ref 0.0–0.1)
Basophils Relative: 1 %
Eosinophils Absolute: 0.1 10*3/uL (ref 0.0–0.5)
Eosinophils Relative: 1 %
HCT: 38.7 % — ABNORMAL LOW (ref 39.0–52.0)
Hemoglobin: 12.7 g/dL — ABNORMAL LOW (ref 13.0–17.0)
Immature Granulocytes: 0 %
Lymphocytes Relative: 13 %
Lymphs Abs: 0.8 10*3/uL (ref 0.7–4.0)
MCH: 26.7 pg (ref 26.0–34.0)
MCHC: 32.8 g/dL (ref 30.0–36.0)
MCV: 81.3 fL (ref 80.0–100.0)
Monocytes Absolute: 0.6 10*3/uL (ref 0.1–1.0)
Monocytes Relative: 9 %
Neutro Abs: 4.5 10*3/uL (ref 1.7–7.7)
Neutrophils Relative %: 76 %
Platelet Count: 182 10*3/uL (ref 150–400)
RBC: 4.76 MIL/uL (ref 4.22–5.81)
RDW: 18.1 % — ABNORMAL HIGH (ref 11.5–15.5)
WBC Count: 6 10*3/uL (ref 4.0–10.5)
nRBC: 0 % (ref 0.0–0.2)

## 2020-11-22 LAB — CMP (CANCER CENTER ONLY)
ALT: 8 U/L (ref 0–44)
AST: 10 U/L — ABNORMAL LOW (ref 15–41)
Albumin: 3.4 g/dL — ABNORMAL LOW (ref 3.5–5.0)
Alkaline Phosphatase: 71 U/L (ref 38–126)
Anion gap: 7 (ref 5–15)
BUN: 15 mg/dL (ref 8–23)
CO2: 29 mmol/L (ref 22–32)
Calcium: 9.4 mg/dL (ref 8.9–10.3)
Chloride: 103 mmol/L (ref 98–111)
Creatinine: 0.84 mg/dL (ref 0.61–1.24)
GFR, Estimated: >60 mL/min (ref >60)
Glucose, Bld: 125 mg/dL — ABNORMAL HIGH (ref 70–99)
Potassium: 4.1 mmol/L (ref 3.5–5.1)
Sodium: 139 mmol/L (ref 135–145)
Total Bilirubin: 0.5 mg/dL (ref 0.3–1.2)
Total Protein: 7 g/dL (ref 6.5–8.1)

## 2020-11-22 LAB — TSH: TSH: 2.791 u[IU]/mL (ref 0.320–4.118)

## 2020-11-22 MED ORDER — SODIUM CHLORIDE 0.9% FLUSH
10.0000 mL | INTRAVENOUS | Status: DC | PRN
  Filled 2020-11-22: qty 10

## 2020-11-22 MED ORDER — SODIUM CHLORIDE 0.9 % IV SOLN
1500.0000 mg | Freq: Once | INTRAVENOUS | Status: AC
  Administered 2020-11-22: 1500 mg via INTRAVENOUS
  Filled 2020-11-22: qty 30

## 2020-11-22 MED ORDER — HEPARIN SOD (PORK) LOCK FLUSH 100 UNIT/ML IV SOLN
500.0000 [IU] | Freq: Once | INTRAVENOUS | Status: DC | PRN
  Filled 2020-11-22: qty 5

## 2020-11-22 MED ORDER — SODIUM CHLORIDE 0.9 % IV SOLN
Freq: Once | INTRAVENOUS | Status: AC
  Filled 2020-11-22: qty 250

## 2020-11-22 MED ORDER — SODIUM CHLORIDE 0.9% FLUSH
10.0000 mL | Freq: Once | INTRAVENOUS | Status: AC
  Administered 2020-11-22: 10 mL
  Filled 2020-11-22: qty 10

## 2020-11-22 NOTE — Patient Instructions (Signed)
[facility]  Discharge Instructions: Thank you for choosing [facility] to provide your oncology and hematology care.   If you have a lab appointment with the Cancer Center, please go directly to the Cancer Center and check in at the registration area.   Wear comfortable clothing and clothing appropriate for easy access to any Portacath or PICC line.   We strive to give you quality time with your provider. You may need to reschedule your appointment if you arrive late (15 or more minutes).  Arriving late affects you and other patients whose appointments are after yours.  Also, if you miss three or more appointments without notifying the office, you may be dismissed from the clinic at the provider's discretion.      For prescription refill requests, have your pharmacy contact our office and allow 72 hours for refills to be completed.    Today you received the following chemotherapy and/or immunotherapy agents Imfinzi      To help prevent nausea and vomiting after your treatment, we encourage you to take your nausea medication as directed.  BELOW ARE SYMPTOMS THAT SHOULD BE REPORTED IMMEDIATELY: *FEVER GREATER THAN 100.4 F (38 C) OR HIGHER *CHILLS OR SWEATING *NAUSEA AND VOMITING THAT IS NOT CONTROLLED WITH YOUR NAUSEA MEDICATION *UNUSUAL SHORTNESS OF BREATH *UNUSUAL BRUISING OR BLEEDING *URINARY PROBLEMS (pain or burning when urinating, or frequent urination) *BOWEL PROBLEMS (unusual diarrhea, constipation, pain near the anus) TENDERNESS IN MOUTH AND THROAT WITH OR WITHOUT PRESENCE OF ULCERS (sore throat, sores in mouth, or a toothache) UNUSUAL RASH, SWELLING OR PAIN  UNUSUAL VAGINAL DISCHARGE OR ITCHING   Items with * indicate a potential emergency and should be followed up as soon as possible or go to the Emergency Department if any problems should occur.  Please show the CHEMOTHERAPY ALERT CARD or IMMUNOTHERAPY ALERT CARD at check-in to the  Emergency Department and triage nurse.  Should you have questions after your visit or need to cancel or reschedule your appointment, please contact [facility]  Dept: [phone]  and follow the prompts.  Office hours are 8:00 a.m. to 4:30 p.m. Monday - Friday. Please note that voicemails left after 4:00 p.m. may not be returned until the following business day.  We are closed weekends and major holidays. You have access to a nurse at all times for urgent questions. Please call the main number to the clinic Dept: [phone] and follow the prompts.   For any non-urgent questions, you may also contact your provider using MyChart. We now offer e-Visits for anyone 18 and older to request care online for non-urgent symptoms. For details visit [email or web address].   Also download the MyChart app! Go to the app store, search "MyChart", open the app, select [facility], and log in with your MyChart username and password.  Due to Covid, a mask is required upon entering the hospital/clinic. If you do not have a mask, one will be given to you upon arrival. For doctor visits, patients may have 1 support person aged 18 or older with them. For treatment visits, patients cannot have anyone with them due to current Covid guidelines and our immunocompromised population.   

## 2020-11-23 ENCOUNTER — Telehealth: Payer: Self-pay | Admitting: Physician Assistant

## 2020-11-23 NOTE — Telephone Encounter (Signed)
Scheduled per los. Called and left msg. Mailed printout  °

## 2020-11-25 DIAGNOSIS — E1165 Type 2 diabetes mellitus with hyperglycemia: Secondary | ICD-10-CM | POA: Diagnosis not present

## 2020-11-25 DIAGNOSIS — I10 Essential (primary) hypertension: Secondary | ICD-10-CM | POA: Diagnosis not present

## 2020-12-20 ENCOUNTER — Inpatient Hospital Stay: Payer: Medicare Other

## 2020-12-20 ENCOUNTER — Encounter: Payer: Self-pay | Admitting: Internal Medicine

## 2020-12-20 ENCOUNTER — Other Ambulatory Visit: Payer: Self-pay

## 2020-12-20 ENCOUNTER — Inpatient Hospital Stay: Payer: Medicare Other | Attending: Internal Medicine | Admitting: Internal Medicine

## 2020-12-20 VITALS — BP 136/82 | HR 81 | Temp 96.7°F | Resp 20 | Ht 69.0 in | Wt 266.1 lb

## 2020-12-20 DIAGNOSIS — C3490 Malignant neoplasm of unspecified part of unspecified bronchus or lung: Secondary | ICD-10-CM

## 2020-12-20 DIAGNOSIS — Z923 Personal history of irradiation: Secondary | ICD-10-CM | POA: Diagnosis not present

## 2020-12-20 DIAGNOSIS — C3411 Malignant neoplasm of upper lobe, right bronchus or lung: Secondary | ICD-10-CM

## 2020-12-20 DIAGNOSIS — Z5112 Encounter for antineoplastic immunotherapy: Secondary | ICD-10-CM | POA: Diagnosis not present

## 2020-12-20 DIAGNOSIS — R0609 Other forms of dyspnea: Secondary | ICD-10-CM | POA: Insufficient documentation

## 2020-12-20 DIAGNOSIS — R1310 Dysphagia, unspecified: Secondary | ICD-10-CM | POA: Insufficient documentation

## 2020-12-20 DIAGNOSIS — R0602 Shortness of breath: Secondary | ICD-10-CM | POA: Insufficient documentation

## 2020-12-20 DIAGNOSIS — Z79899 Other long term (current) drug therapy: Secondary | ICD-10-CM | POA: Insufficient documentation

## 2020-12-20 DIAGNOSIS — Z95828 Presence of other vascular implants and grafts: Secondary | ICD-10-CM

## 2020-12-20 LAB — CMP (CANCER CENTER ONLY)
ALT: 10 U/L (ref 0–44)
AST: 11 U/L — ABNORMAL LOW (ref 15–41)
Albumin: 3.4 g/dL — ABNORMAL LOW (ref 3.5–5.0)
Alkaline Phosphatase: 74 U/L (ref 38–126)
Anion gap: 7 (ref 5–15)
BUN: 12 mg/dL (ref 8–23)
CO2: 26 mmol/L (ref 22–32)
Calcium: 9 mg/dL (ref 8.9–10.3)
Chloride: 105 mmol/L (ref 98–111)
Creatinine: 0.86 mg/dL (ref 0.61–1.24)
GFR, Estimated: >60 mL/min (ref >60)
Glucose, Bld: 127 mg/dL — ABNORMAL HIGH (ref 70–99)
Potassium: 4.2 mmol/L (ref 3.5–5.1)
Sodium: 138 mmol/L (ref 135–145)
Total Bilirubin: 0.5 mg/dL (ref 0.3–1.2)
Total Protein: 7 g/dL (ref 6.5–8.1)

## 2020-12-20 LAB — CBC WITH DIFFERENTIAL (CANCER CENTER ONLY)
Abs Immature Granulocytes: 0.01 10*3/uL (ref 0.00–0.07)
Basophils Absolute: 0 10*3/uL (ref 0.0–0.1)
Basophils Relative: 1 %
Eosinophils Absolute: 0.1 10*3/uL (ref 0.0–0.5)
Eosinophils Relative: 2 %
HCT: 39.9 % (ref 39.0–52.0)
Hemoglobin: 13.2 g/dL (ref 13.0–17.0)
Immature Granulocytes: 0 %
Lymphocytes Relative: 16 %
Lymphs Abs: 0.9 10*3/uL (ref 0.7–4.0)
MCH: 27.2 pg (ref 26.0–34.0)
MCHC: 33.1 g/dL (ref 30.0–36.0)
MCV: 82.1 fL (ref 80.0–100.0)
Monocytes Absolute: 0.6 10*3/uL (ref 0.1–1.0)
Monocytes Relative: 11 %
Neutro Abs: 3.9 10*3/uL (ref 1.7–7.7)
Neutrophils Relative %: 70 %
Platelet Count: 186 10*3/uL (ref 150–400)
RBC: 4.86 MIL/uL (ref 4.22–5.81)
RDW: 17 % — ABNORMAL HIGH (ref 11.5–15.5)
WBC Count: 5.5 10*3/uL (ref 4.0–10.5)
nRBC: 0 % (ref 0.0–0.2)

## 2020-12-20 LAB — TSH: TSH: 3.971 u[IU]/mL (ref 0.320–4.118)

## 2020-12-20 MED ORDER — SODIUM CHLORIDE 0.9% FLUSH
10.0000 mL | Freq: Once | INTRAVENOUS | Status: AC
  Administered 2020-12-20: 10 mL

## 2020-12-20 MED ORDER — SODIUM CHLORIDE 0.9% FLUSH
10.0000 mL | INTRAVENOUS | Status: DC | PRN
Start: 2020-12-20 — End: 2020-12-20
  Administered 2020-12-20: 10 mL

## 2020-12-20 MED ORDER — SODIUM CHLORIDE 0.9 % IV SOLN: Freq: Once | INTRAVENOUS | Status: AC

## 2020-12-20 MED ORDER — SODIUM CHLORIDE 0.9 % IV SOLN
1500.0000 mg | Freq: Once | INTRAVENOUS | Status: AC
  Administered 2020-12-20: 1500 mg via INTRAVENOUS
  Filled 2020-12-20: qty 30

## 2020-12-20 MED ORDER — HEPARIN SOD (PORK) LOCK FLUSH 100 UNIT/ML IV SOLN
500.0000 [IU] | Freq: Once | INTRAVENOUS | Status: AC | PRN
  Administered 2020-12-20: 500 [IU]

## 2020-12-20 NOTE — Progress Notes (Signed)
[facility] Telephone:([phone]) [phone]   Fax:([phone]  OFFICE PROGRESS NOTE  [name], MD [place] [address]  DIAGNOSIS: Stage IIIA (T4, N0, M0) non-small cell lung cancer, squamous cell carcinoma presented with large right upper lobe lung mass diagnosed in October 2021.   PRIOR THERAPY: Concurrent chemoradiation with weekly carboplatin for AUC of 2 and paclitaxel 45 mg/M2. Status post 6 cycles. First dose on 02/27/20.  Last dose was given April 02, 2020.   CURRENT THERAPY:  Consolidation treatment with immunotherapy with Imfinzi 1500 mg IV every 4 weeks.  First dose May 10, 2020.  Status post 8 cycles.  INTERVAL HISTORY: Billy Hall 75 y.o. male returns to the clinic today for follow-up visit.  The patient is feeling fine today with no concerning complaints except for mild shortness of breath with exertion.  He denied having any chest pain, cough or hemoptysis.  He has no weight loss or night sweats.  He has no nausea, vomiting, diarrhea or constipation.  He has no headache or visual changes.  He continues to tolerate his consolidation treatment with Imfinzi fairly well.  The patient is here today for evaluation before starting cycle #9.  MEDICAL HISTORY: Past Medical History:  Diagnosis Date   Arthritis    Depressive disorder    Diabetes mellitus without complication (HCC)    Fatigue    Headache(784.0)    Hyperlipidemia    Hypertension    Hypothyroidism    Snoring     ALLERGIES:  is allergic to ace inhibitors.  MEDICATIONS:  Current Outpatient Medications  Medication Sig Dispense Refill   acetaminophen (TYLENOL) 650 MG CR tablet Take 650 mg by mouth every 8 (eight) hours as needed for pain.     aspirin EC 81 MG tablet Take 81 mg by mouth daily.     cholecalciferol (VITAMIN D3) 25 MCG (1000 UNIT) tablet Take 1,000 Units by mouth daily.     levothyroxine (SYNTHROID) 88 MCG tablet Take 88 mcg by mouth daily before  breakfast.      losartan (COZAAR) 50 MG tablet Take 50 mg by mouth daily.      metFORMIN (GLUCOPHAGE) 500 MG tablet Take 500 mg by mouth 2 (two) times daily with a meal.      ONETOUCH ULTRA test strip 1 each 2 (two) times daily.     prochlorperazine (COMPAZINE) 10 MG tablet Take 1 tablet (10 mg total) by mouth every 6 (six) hours as needed for nausea or vomiting. (Patient not taking: Reported on 10/25/2020) 30 tablet 0   sildenafil (REVATIO) 20 MG tablet Take 20 mg by mouth daily as needed (ED).      simvastatin (ZOCOR) 10 MG tablet Take 10 mg by mouth every evening.      tamsulosin (FLOMAX) 0.4 MG CAPS capsule Take 0.4 mg by mouth daily.     No current facility-administered medications for this visit.    SURGICAL HISTORY:  Past Surgical History:  Procedure Laterality Date   COLONOSCOPY N/A 03/08/2013   Procedure: COLONOSCOPY;  Surgeon: Jamesetta So, MD;  [facility];  Service: Gastroenterology;  Laterality: N/A;   IR IMAGING GUIDED PORT INSERTION  03/08/2020   IR US GUIDE BX ASP/DRAIN  03/08/2020   KNEE ARTHROSCOPY Right    2006 By Dr. Luna Glasgow at [facility].    THYROID SURGERY     VIDEO BRONCHOSCOPY WITH ENDOBRONCHIAL ULTRASOUND N/A 01/30/2020   Procedure: VIDEO BRONCHOSCOPY WITH ENDOBRONCHIAL ULTRASOUND;  Surgeon: Melrose Nakayama, MD;  Location: [facility] OR;  Service: Thoracic;  Laterality: N/A;    REVIEW OF SYSTEMS:  A comprehensive review of systems was negative except for: Respiratory: positive for dyspnea on exertion   PHYSICAL EXAMINATION: General appearance: alert, cooperative, and no distress Head: Normocephalic, without obvious abnormality, atraumatic Neck: no adenopathy, no JVD, supple, symmetrical, trachea midline, and thyroid not enlarged, symmetric, no tenderness/mass/nodules Lymph nodes: Cervical, supraclavicular, and axillary nodes normal. Resp: clear to auscultation bilaterally Back: symmetric, no curvature. ROM normal. No CVA tenderness. Cardio: regular rate  and rhythm, S1, S2 normal, no murmur, click, rub or gallop GI: soft, non-tender; bowel sounds normal; no masses,  no organomegaly Extremities: extremities normal, atraumatic, no cyanosis or edema  ECOG PERFORMANCE STATUS: 1 - Symptomatic but completely ambulatory  Blood pressure 136/82, pulse 81, temperature (!) 96.7 F (35.9 C), temperature source Tympanic, resp. rate 20, height 5\' 9"  (1.753 m), weight 266 lb 1.6 oz (120.7 kg), SpO2 96 %.  LABORATORY DATA: Lab Results  Component Value Date   WBC 5.5 12/20/2020   HGB 13.2 12/20/2020   HCT 39.9 12/20/2020   MCV 82.1 12/20/2020   PLT 186 12/20/2020      Chemistry      Component Value Date/Time   NA 138 12/20/2020 0917   K 4.2 12/20/2020 0917   CL 105 12/20/2020 0917   CO2 26 12/20/2020 0917   BUN 12 12/20/2020 0917   CREATININE 0.86 12/20/2020 0917      Component Value Date/Time   CALCIUM 9.0 12/20/2020 0917   ALKPHOS 74 12/20/2020 0917   AST 11 (L) 12/20/2020 0917   ALT 10 12/20/2020 0917   BILITOT 0.5 12/20/2020 0917       RADIOGRAPHIC STUDIES: No results found.  ASSESSMENT AND PLAN: This is a very pleasant 75 years old African-American male recently diagnosed with a stage IIIA non-small cell lung cancer, squamous cell carcinoma presented with large right upper lobe lung mass in October 2021. The patient underwent a course of concurrent chemoradiation with weekly carboplatin and paclitaxel status post 6 cycles.  The patient tolerated this course of treatment fairly well except for mild odynophagia. Imaging studies after the induction phase of concurrent chemoradiation showed interval decrease in the size and significant central cavitation of the previously demonstrated right upper lobe lung mass consistent with response to therapy. The patient is currently undergoing consolidation treatment with immunotherapy with Imfinzi 1500 mg IV every 4 weeks status post 8 cycles.   The patient has been tolerating this treatment well  with no concerning adverse effects. I recommended for him to proceed with cycle #9 today as planned. I will see him back for follow-up visit in 4 weeks for evaluation with repeat CT scan of the chest for restaging of his disease. The patient was advised to call immediately if he has any concerning symptoms in the interval. The patient voices understanding of current disease status and treatment options and is in agreement with the current care plan.  All questions were answered. The patient knows to call the clinic with any problems, questions or concerns. We can certainly see the patient much sooner if necessary.   Disclaimer: This note was dictated with voice recognition software. Similar sounding words can inadvertently be transcribed and may not be corrected upon review.

## 2020-12-20 NOTE — Patient Instructions (Signed)
[facility]   Discharge Instructions: Thank you for choosing [facility] to provide your oncology and hematology care.   If you have a lab appointment with the Cancer Center, please go directly to the Cancer Center and check in at the registration area.   Wear comfortable clothing and clothing appropriate for easy access to any Portacath or PICC line.   We strive to give you quality time with your provider. You may need to reschedule your appointment if you arrive late (15 or more minutes).  Arriving late affects you and other patients whose appointments are after yours.  Also, if you miss three or more appointments without notifying the office, you may be dismissed from the clinic at the provider's discretion.      For prescription refill requests, have your pharmacy contact our office and allow 72 hours for refills to be completed.    Today you received the following chemotherapy and/or immunotherapy agents: Durvalumab (Imfinzi).      To help prevent nausea and vomiting after your treatment, we encourage you to take your nausea medication as directed.  BELOW ARE SYMPTOMS THAT SHOULD BE REPORTED IMMEDIATELY: *FEVER GREATER THAN 100.4 F (38 C) OR HIGHER *CHILLS OR SWEATING *NAUSEA AND VOMITING THAT IS NOT CONTROLLED WITH YOUR NAUSEA MEDICATION *UNUSUAL SHORTNESS OF BREATH *UNUSUAL BRUISING OR BLEEDING *URINARY PROBLEMS (pain or burning when urinating, or frequent urination) *BOWEL PROBLEMS (unusual diarrhea, constipation, pain near the anus) TENDERNESS IN MOUTH AND THROAT WITH OR WITHOUT PRESENCE OF ULCERS (sore throat, sores in mouth, or a toothache) UNUSUAL RASH, SWELLING OR PAIN  UNUSUAL VAGINAL DISCHARGE OR ITCHING   Items with * indicate a potential emergency and should be followed up as soon as possible or go to the Emergency Department if any problems should occur.  Please show the CHEMOTHERAPY ALERT CARD or IMMUNOTHERAPY ALERT CARD  at check-in to the Emergency Department and triage nurse.  Should you have questions after your visit or need to cancel or reschedule your appointment, please contact [facility]  Dept: [phone]  and follow the prompts.  Office hours are 8:00 a.m. to 4:30 p.m. Monday - Friday. Please note that voicemails left after 4:00 p.m. may not be returned until the following business day.  We are closed weekends and major holidays. You have access to a nurse at all times for urgent questions. Please call the main number to the clinic Dept: [phone] and follow the prompts.   For any non-urgent questions, you may also contact your provider using MyChart. We now offer e-Visits for anyone 18 and older to request care online for non-urgent symptoms. For details visit [email or web address].   Also download the MyChart app! Go to the app store, search "MyChart", open the app, select [facility], and log in with your MyChart username and password.  Due to Covid, a mask is required upon entering the hospital/clinic. If you do not have a mask, one will be given to you upon arrival. For doctor visits, patients may have 1 support person aged 18 or older with them. For treatment visits, patients cannot have anyone with them due to current Covid guidelines and our immunocompromised population.   

## 2020-12-25 ENCOUNTER — Ambulatory Visit: Payer: Medicare Other | Admitting: Urology

## 2021-01-11 IMAGING — CT CT CHEST W/ CM
2 of 4 series · 15 of 36 positions shown, 18 images · IV contrast (omnipaque)
Comparison: PET-CT 02/06/2020.  Chest CT 01/09/2020 and 03/15/2007.

CLINICAL DATA: Non-small cell lung cancer. Radiation therapy
completed 1 month ago.

EXAM:
CT CHEST WITH CONTRAST
TECHNIQUE: Multidetector CT imaging of the chest was performed during
intravenous contrast administration.
CONTRAST:  75mL OMNIPAQUE IOHEXOL 300 MG/ML  SOLN

[Series 2: axial st · axial · 0.83mm/px · z∈[+1257,+1541]mm · 12 of 168 slices shown, 15 images]
[im 13/168  mediastinal]
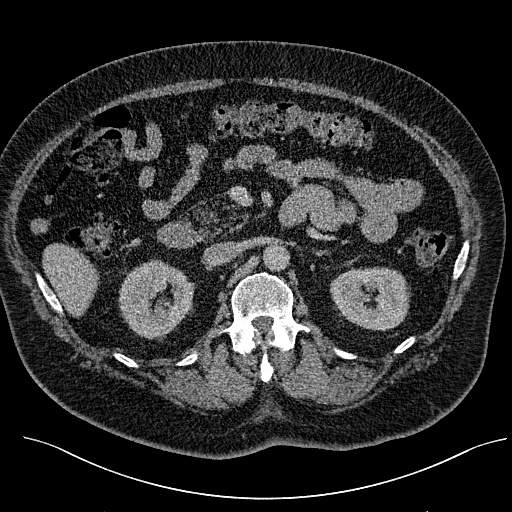
[im 13/168  lung]
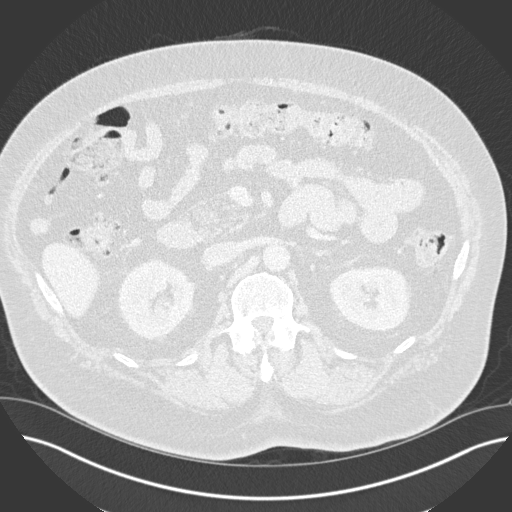
[im 26/168  lung]
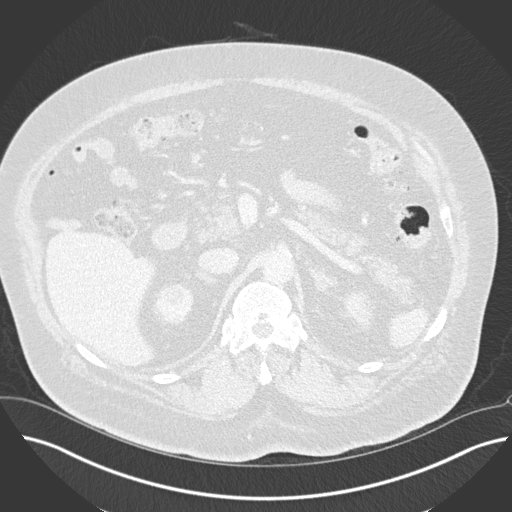
[im 39/168  lung]
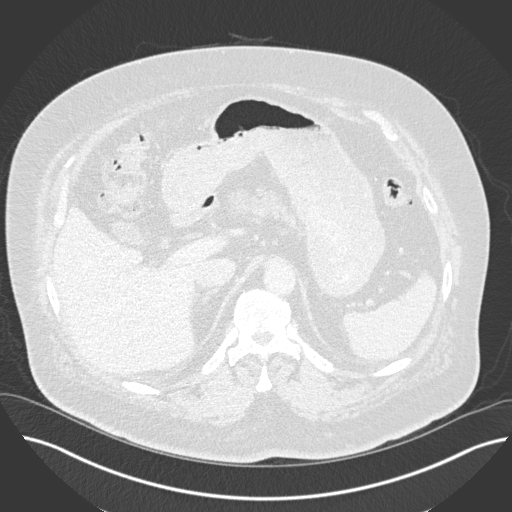
[im 52/168  lung]
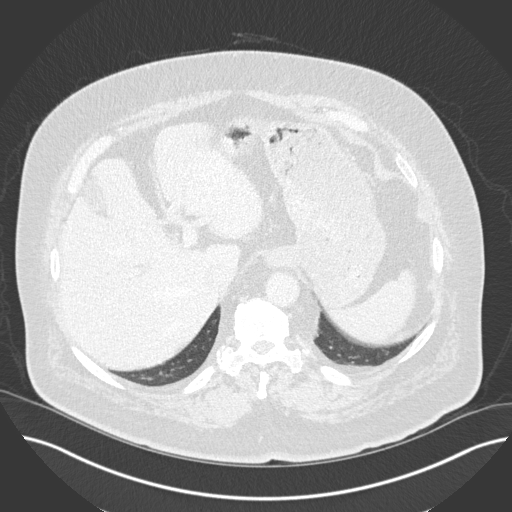
[im 65/168  mediastinal]
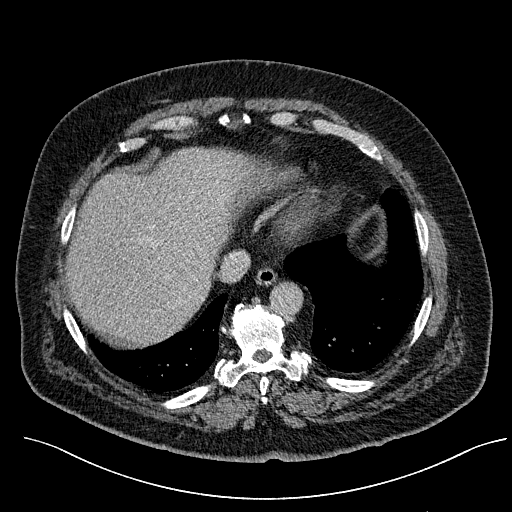
[im 65/168  lung]
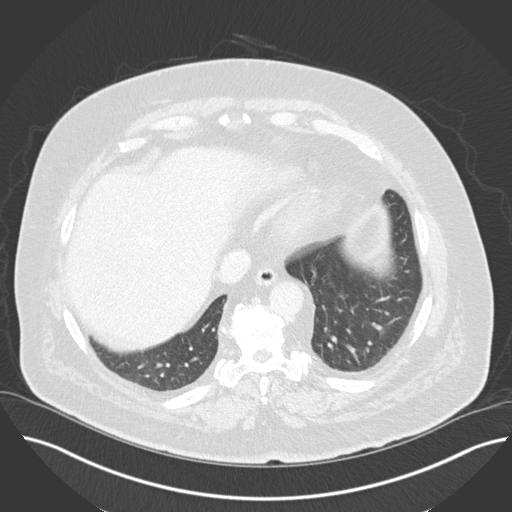
[im 78/168  lung]
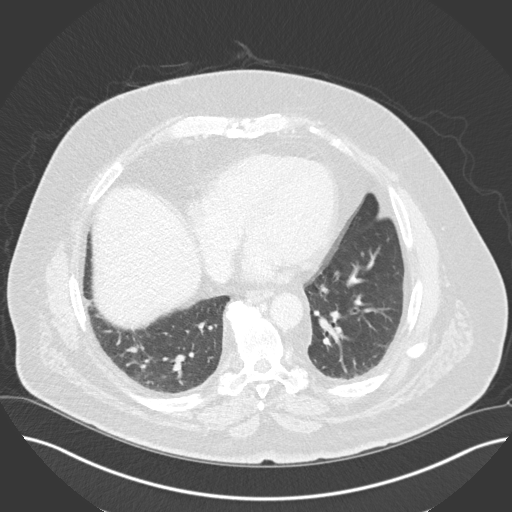
[im 90/168  lung]
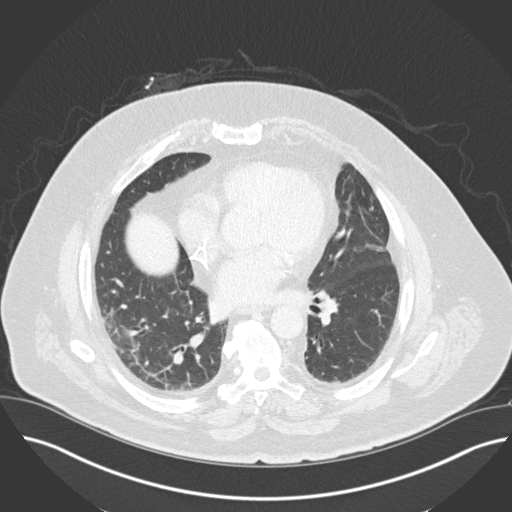
[im 103/168  lung]
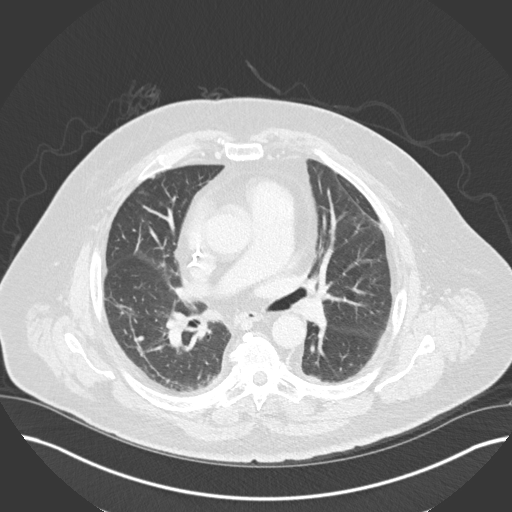
[im 116/168  mediastinal]
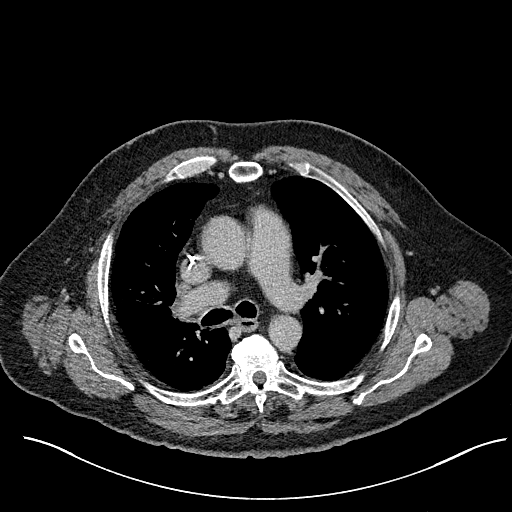
[im 116/168  lung]
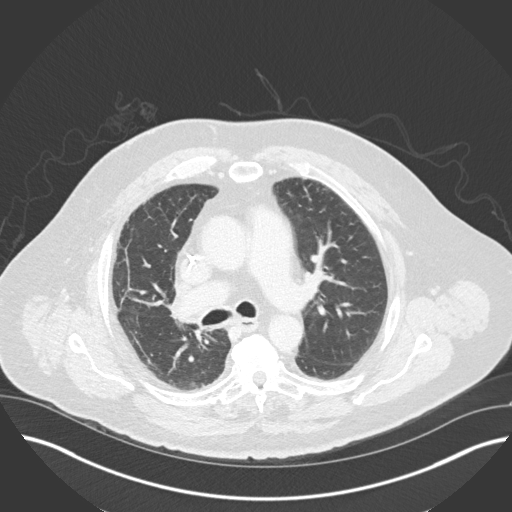
[im 129/168  lung]
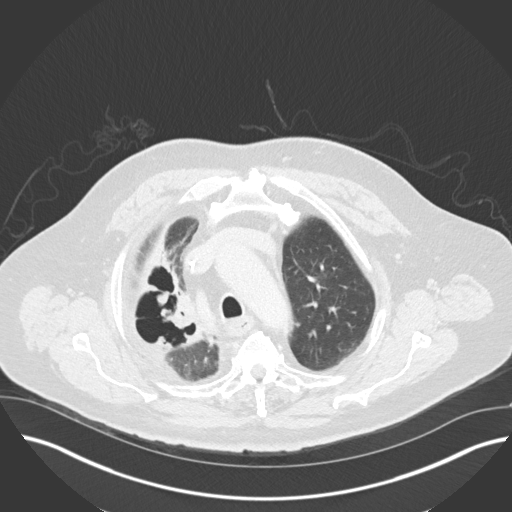
[im 142/168  lung]
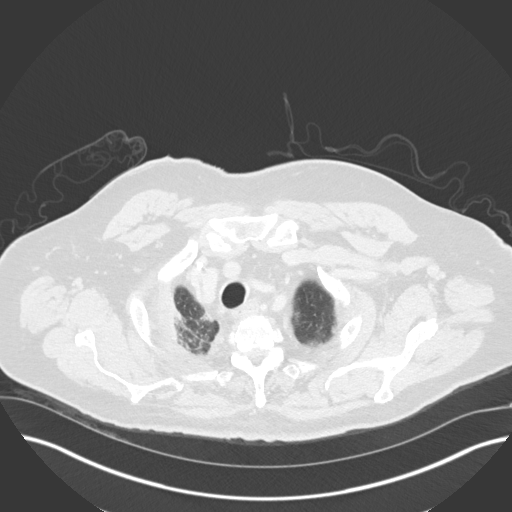
[im 155/168  lung]
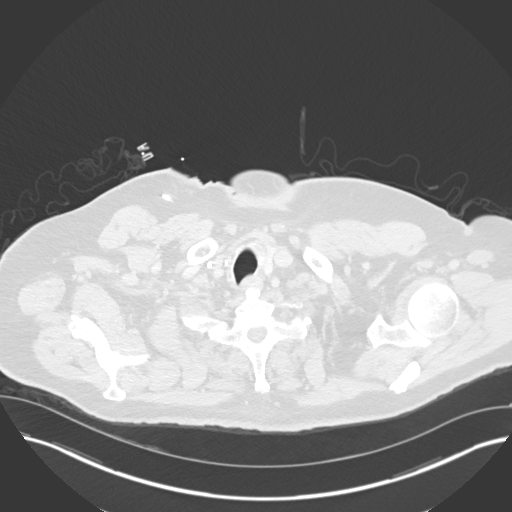

[Series 5: coronal · coronal · 0.66mm/px · 3 of 166 slices shown]
[im 34/166  lung]
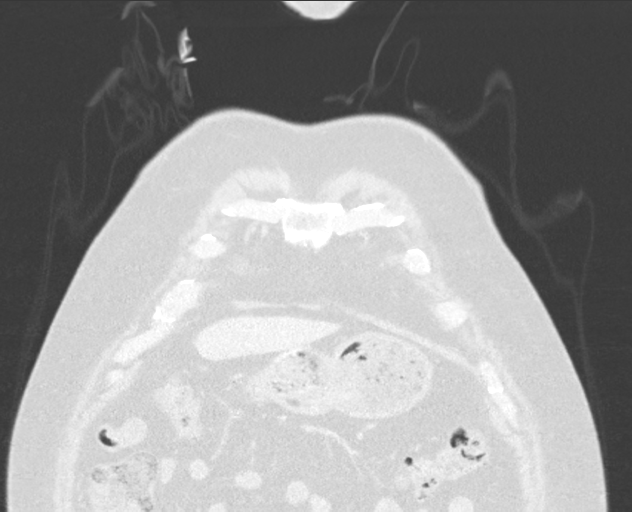
[im 67/166  lung]
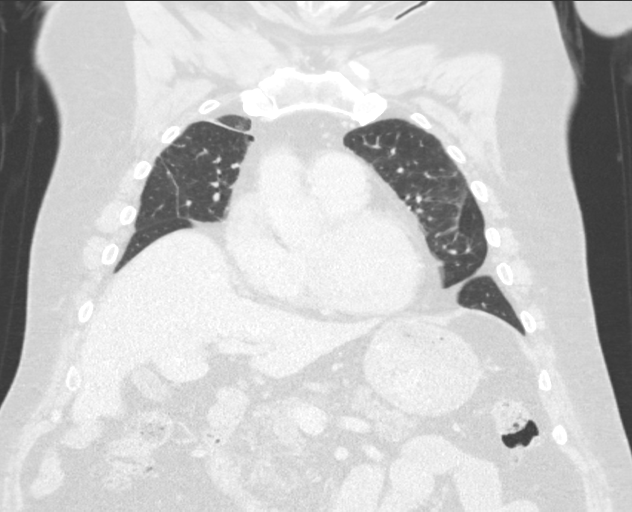
[im 100/166  lung]
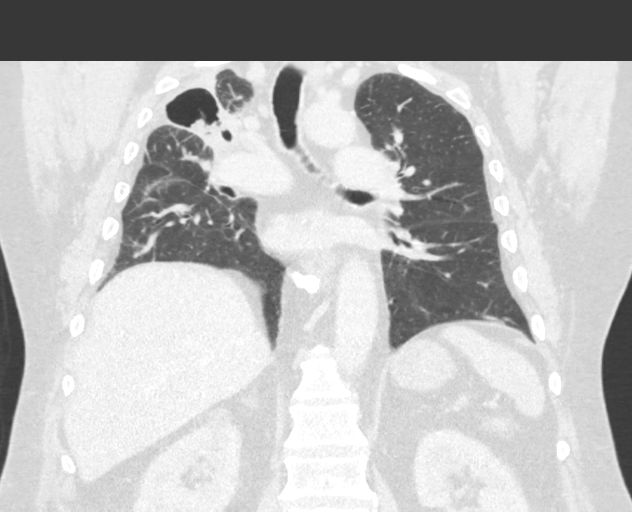

[15 of 36 positions shown; findings below may reference images not displayed]

FINDINGS: Cardiovascular: No acute vascular findings are seen, although the
contrast bolus is suboptimal. There is a right IJ Port-A-Cath with
its tip in the superior right atrium. Mild aortic atherosclerosis.
There is mild central enlargement of the pulmonary arteries,
suggesting pulmonary arterial hypertension. The heart size is
normal. There is no pericardial effusion.

Mediastinum/Nodes: There are no enlarged mediastinal, hilar or
axillary lymph nodes.Stable tracheal deviation to the right status
post right thyroidectomy. The left thyroid lobe, trachea and
esophagus otherwise appear unremarkable.

Lungs/Pleura: No pleural effusion or pneumothorax. Interval
significant cavitation of the previously demonstrated hypermetabolic
right upper lobe mass. The remaining cavitary lesion measures
approximately 8.3 x 5.5 cm on image 39/7 and has significantly
smaller soft tissue components compared with previous imaging. There
is mild right upper lobe bronchial narrowing. Stable mild subpleural
reticulation and linear scarring in both lungs. No new or enlarging
pulmonary nodules.

Upper abdomen: The visualized upper abdomen appears stable without
significant findings. There is a stable low-density probable cyst in
the upper pole of the right kidney and mild nodularity of both
adrenal glands.

Musculoskeletal/Chest wall: There is no chest wall mass or
suspicious osseous finding.
IMPRESSION: 1. Interval decreased size and significant central cavitation of the
previously demonstrated hypermetabolic right upper lobe mass
consistent with response to therapy.
2. No evidence of local recurrence or metastatic disease.
3. Central enlargement of the pulmonary arteries, suggesting
pulmonary arterial hypertension.
4. Aortic Atherosclerosis ([id number]).

## 2021-01-14 ENCOUNTER — Ambulatory Visit (INDEPENDENT_AMBULATORY_CARE_PROVIDER_SITE_OTHER): Payer: Medicare Other | Admitting: Urology

## 2021-01-14 ENCOUNTER — Other Ambulatory Visit: Payer: Self-pay

## 2021-01-14 VITALS — BP 123/75 | HR 79

## 2021-01-14 DIAGNOSIS — N489 Disorder of penis, unspecified: Secondary | ICD-10-CM | POA: Insufficient documentation

## 2021-01-14 DIAGNOSIS — N401 Enlarged prostate with lower urinary tract symptoms: Secondary | ICD-10-CM | POA: Insufficient documentation

## 2021-01-14 DIAGNOSIS — N138 Other obstructive and reflux uropathy: Secondary | ICD-10-CM | POA: Diagnosis not present

## 2021-01-14 DIAGNOSIS — N4889 Other specified disorders of penis: Secondary | ICD-10-CM

## 2021-01-14 LAB — URINALYSIS, ROUTINE W REFLEX MICROSCOPIC
Bilirubin, UA: NEGATIVE
Glucose, UA: NEGATIVE
Ketones, UA: NEGATIVE
Leukocytes,UA: NEGATIVE
Nitrite, UA: NEGATIVE
Protein,UA: NEGATIVE
RBC, UA: NEGATIVE
Specific Gravity, UA: 1.015 (ref 1.005–1.030)
Urobilinogen, Ur: 1 mg/dL (ref 0.2–1.0)
pH, UA: 7.5 (ref 5.0–7.5)

## 2021-01-14 LAB — BLADDER SCAN AMB NON-IMAGING: Scan Result: 0

## 2021-01-14 NOTE — Progress Notes (Signed)
post void residual=0  Urological Symptom Review  Patient is experiencing the following symptoms: Frequent urination Hard to postpone urination Get up at night to urinate Erection problems (male only) Penile pain (male only)  Leakage of urine   Review of Systems  Gastrointestinal (upper)  : Negative for upper GI symptoms  Gastrointestinal (lower) : Negative for lower GI symptoms  Constitutional : Fatigue  Skin: Itching  Eyes: Negative for eye symptoms  Ear/Nose/Throat : Negative for Ear/Nose/Throat symptoms  Hematologic/Lymphatic: Negative for Hematologic/Lymphatic symptoms  Cardiovascular : Negative for cardiovascular symptoms  Respiratory : Cough Shortness of breath  Endocrine: Negative for endocrine symptoms  Musculoskeletal: Back pain Joint pain  Neurological: Negative for neurological symptoms  Psychologic: Negative for psychiatric symptoms

## 2021-01-14 NOTE — Progress Notes (Signed)
[facility] OFFICE PROGRESS NOTE  [name], MD [place] [address]  DIAGNOSIS: Stage IIIA (T4, N0, M0) non-small cell lung cancer, squamous cell carcinoma presented with large right upper lobe lung mass diagnosed in October 2021.  PRIOR THERAPY: Concurrent chemoradiation with weekly carboplatin for AUC of 2 and paclitaxel 45 mg/M2. Status post 6 cycles. First dose on 02/27/20.  Last dose was given April 02, 2020.  CURRENT THERAPY: Consolidation treatment with immunotherapy with Imfinzi 1500 mg IV every 4 weeks.  First dose May 10, 2020.  Status post 9 cycles.  INTERVAL HISTORY: Billy Hall 75 y.o. male returns to the clinic today for a follow-up visit.  The patient is feeling  fairly well today without any concerning complaints. The patient denies any recent fever, chills, weight loss, or night sweats.  He reports his baseline cough and dyspnea on exertion which he states is unchanged since his last appointment. He denies any chest pain or hemoptysis.  He denies any nausea, vomiting, diarrhea, or constipation.  He denies any headache or visual changes.  He denies any rashes or skin changes.  The patient recently had a restaging CT scan performed. The patient is here today for evaluation and to review his scan before starting cycle #10.    MEDICAL HISTORY: Past Medical History:  Diagnosis Date   Arthritis    Depressive disorder    Diabetes mellitus without complication (HCC)    Fatigue    Headache(784.0)    Hyperlipidemia    Hypertension    Hypothyroidism    Snoring     ALLERGIES:  is allergic to ace inhibitors.  MEDICATIONS:  Current Outpatient Medications  Medication Sig Dispense Refill   acetaminophen (TYLENOL) 650 MG CR tablet Take 650 mg by mouth every 8 (eight) hours as needed for pain.     aspirin EC 81 MG tablet Take 81 mg by mouth daily.     cholecalciferol (VITAMIN D3) 25 MCG (1000 UNIT) tablet Take 1,000 Units by  mouth daily.     levothyroxine (SYNTHROID) 88 MCG tablet Take 88 mcg by mouth daily before breakfast.      losartan (COZAAR) 50 MG tablet Take 50 mg by mouth daily.      metFORMIN (GLUCOPHAGE) 500 MG tablet Take 500 mg by mouth 2 (two) times daily with a meal.      ONETOUCH ULTRA test strip 1 each 2 (two) times daily.     sildenafil (REVATIO) 20 MG tablet Take 20 mg by mouth daily as needed (ED).      simvastatin (ZOCOR) 10 MG tablet Take 10 mg by mouth every evening.      tamsulosin (FLOMAX) 0.4 MG CAPS capsule Take 0.4 mg by mouth daily.     No current facility-administered medications for this visit.    SURGICAL HISTORY:  Past Surgical History:  Procedure Laterality Date   COLONOSCOPY N/A 03/08/2013   Procedure: COLONOSCOPY;  Surgeon: Jamesetta So, MD;  [facility];  Service: Gastroenterology;  Laterality: N/A;   IR IMAGING GUIDED PORT INSERTION  03/08/2020   IR US GUIDE BX ASP/DRAIN  03/08/2020   KNEE ARTHROSCOPY Right    2006 By Dr. Luna Glasgow at [facility].    THYROID SURGERY     VIDEO BRONCHOSCOPY WITH ENDOBRONCHIAL ULTRASOUND N/A 01/30/2020   Procedure: VIDEO BRONCHOSCOPY WITH ENDOBRONCHIAL ULTRASOUND;  Surgeon: Melrose Nakayama, MD;  Location: [facility];  Service: Thoracic;  Laterality: N/A;    REVIEW OF SYSTEMS:  Review of Systems  Constitutional: Negative for appetite change, chills, fatigue, fever and unexpected weight change.  HENT:   Negative for mouth sores, nosebleeds, sore throat and trouble swallowing.   Eyes: Negative for eye problems and icterus.  Respiratory: Positive for baseline dyspnea on exertion and cough. Negative for hemoptysis and wheezing.   Cardiovascular: Negative for chest pain and leg swelling.  Gastrointestinal: Negative for abdominal pain, constipation, diarrhea, nausea and vomiting.  Genitourinary: Negative for bladder incontinence, difficulty urinating, dysuria, frequency and hematuria.   Musculoskeletal: Negative for back pain, gait  problem, neck pain and neck stiffness.  Skin: Negative for itching and rash.  Neurological: Negative for dizziness, extremity weakness, gait problem, headaches, light-headedness and seizures.  Hematological: Negative for adenopathy. Does not bruise/bleed easily.  Psychiatric/Behavioral: Negative for confusion, depression and sleep disturbance. The patient is not nervous/anxious.     PHYSICAL EXAMINATION:  Blood pressure 126/77, pulse 63, temperature (!) 97 F (36.1 C), resp. rate 18, weight 266 lb (120.7 kg), SpO2 98 %.  ECOG PERFORMANCE STATUS: 1  Physical Exam  Constitutional: Oriented to person, place, and time and well-developed, well-nourished, and in no distress. HENT: Head: Normocephalic and atraumatic. Mouth/Throat: Oropharynx is clear and moist. No oropharyngeal exudate. Eyes: Conjunctivae are normal. Right eye exhibits no discharge. Left eye exhibits no discharge. No scleral icterus. Neck: Normal range of motion. Neck supple. Cardiovascular: Normal rate, regular rhythm, normal heart sounds and intact distal pulses.   Pulmonary/Chest: Effort normal and breath sounds normal. No respiratory distress. No wheezes. No rales. Abdominal: Soft. Bowel sounds are normal. Exhibits no distension and no mass. There is no tenderness.  Musculoskeletal: Normal range of motion. Exhibits no edema.  Lymphadenopathy:    No cervical adenopathy.  Neurological: Alert and oriented to person, place, and time. Exhibits normal muscle tone. Gait normal. Coordination normal. Skin: Skin is warm and dry. No rash noted. Not diaphoretic. No erythema. No pallor.  Psychiatric: Mood, memory and judgment normal. Vitals reviewed.  LABORATORY DATA: Lab Results  Component Value Date   WBC 5.5 12/20/2020   HGB 13.2 12/20/2020   HCT 39.9 12/20/2020   MCV 82.1 12/20/2020   PLT 186 12/20/2020      Chemistry      Component Value Date/Time   NA 141 01/17/2021 0944   K 4.1 01/17/2021 0944   CL 106 01/17/2021  0944   CO2 27 01/17/2021 0944   BUN 15 01/17/2021 0944   CREATININE 0.78 01/17/2021 0944      Component Value Date/Time   CALCIUM 9.1 01/17/2021 0944   ALKPHOS 73 01/17/2021 0944   AST 12 (L) 01/17/2021 0944   ALT 10 01/17/2021 0944   BILITOT 0.4 01/17/2021 0944       RADIOGRAPHIC STUDIES:  CT Chest W Contrast  Result Date: 01/16/2021 CLINICAL DATA:  Lung cancer. EXAM: CT CHEST WITH CONTRAST TECHNIQUE: Multidetector CT imaging of the chest was performed during intravenous contrast administration. CONTRAST:  75mL OMNIPAQUE IOHEXOL 350 MG/ML SOLN COMPARISON:  10/23/2020. FINDINGS: Cardiovascular: Right IJ Port-A-Cath terminates in the right atrium. Atherosclerotic calcification of the aorta and coronary arteries. Pulmonic trunk is enlarged. Chronically thrombosed right upper lobe pulmonary artery. Heart is at the upper limits of normal in size to mildly enlarged. No pericardial effusion. Mediastinum/Nodes: No pathologically enlarged mediastinal, hilar or axillary lymph nodes. Esophagus is grossly unremarkable. Lungs/Pleura: Pleural thickening, cavitation and post radiation pulmonary retraction/bronchiectasis in the upper right hemithorax, unchanged. Pleuroparenchymal scarring in both hemithoraces. Trace right pleural fluid, unchanged. Airway is unremarkable.  Upper Abdomen: Liver appears slightly decreased in attenuation diffusely. Visualized portions of the liver, gallbladder and right adrenal gland are otherwise unremarkable. Thickening of the left adrenal gland. 1.8 cm low-density lesion in the upper pole right kidney, similar. Visualized portions of the kidneys, spleen, pancreas, stomach and bowel are otherwise unremarkable. Musculoskeletal: Degenerative changes in the spine. No worrisome lytic or sclerotic lesions. IMPRESSION: 1. Post treatment scarring in the upper right hemithorax, stable. No evidence of recurrent or metastatic disease. 2. Trace right pleural fluid, stable. 3. Liver appears  steatotic. 4. Aortic atherosclerosis (ICD10-I70.0). Coronary artery calcification. 5. Enlarged pulmonic trunk, indicative of pulmonary arterial hypertension. Electronically Signed   By: Lorin Picket M.D.   On: 01/16/2021 15:15     ASSESSMENT/PLAN:  This is a very pleasant 75 year old African-American male diagnosed with stage IIIa non-small cell lung cancer, squamous cell carcinoma.  He presented with a large right upper lobe lung mass in October 2021.  The patient underwent a course of concurrent chemoradiation with weekly carboplatin for an AUC of 2 and paclitaxel 45 mg per metered squared.  He is status post 6 cycles.  The patient tolerated this treatment well except for mild odynophagia.  The patient is currently undergoing consolidation treatment with immunotherapy with Imfinzi 1500 mg IV every 4 weeks.  He is status post 9 cycles of treatment.  The patient recently had a restaging CT scan performed.  Dr. Julien Nordmann personally and independently reviewed the scan and discussed the results with the patient today.  The scan showed no evidence for disease progression.  Recommend that he proceed with cycle #10 today scheduled  We will see him back for follow-up visit in 4 weeks for evaluation before starting cycle 11.   His CBC was not drawn today. I have entered new orders. Will arrange for his CBC to be drawn in infusion room.   The patient was advised to call immediately if he has any concerning symptoms in the interval. The patient voices understanding of current disease status and treatment options and is in agreement with the current care plan. All questions were answered. The patient knows to call the clinic with any problems, questions or concerns. We can certainly see the patient much sooner if necessary  Orders Placed This Encounter  Procedures   CBC with Differential ([facility] Only)    Standing Status:   Standing    Number of Occurrences:   3    Standing Expiration Date:    01/17/2022   TSH    Standing Status:   Standing    Number of Occurrences:   4    Standing Expiration Date:   01/17/2022      Tobe Sos Jeanett Antonopoulos, PA-C 01/17/21  ADDENDUM: Hematology/Oncology Attending: I had a face-to-face encounter with the patient today.  I reviewed his record, lab, scan and recommended his care plan.  This is a very pleasant 75 years old African-American male diagnosed with a stage IIIa non-small cell lung cancer, squamous cell carcinoma in October 2021 status post a course of concurrent chemoradiation with weekly carboplatin and paclitaxel status post 6 cycles with partial response.  The patient is currently undergoing consolidation treatment with immunotherapy with Imfinzi 1500 Mg IV every 4 weeks status post 9 cycles.  The patient has been tolerating this treatment well with no concerning adverse effects except for mild fatigue. He had repeat CT scan of the chest performed recently.  I personally and independently reviewed the scans and discussed the results with the patient  today. His scan showed no concerning findings for disease progression. I recommended for the patient to continue his current consolidation treatment with immunotherapy and he will proceed with cycle #10 today. He will come back for follow-up visit in 4 weeks for evaluation before the next cycle of his treatment. The patient was advised to call immediately if he has any other concerning symptoms in the interval.  Disclaimer: This note was dictated with voice recognition software. Similar sounding words can inadvertently be transcribed and may be missed upon review. Eilleen Kempf, MD 01/18/21

## 2021-01-14 NOTE — Progress Notes (Signed)
Assessment: 1. Penile lesion   2. BPH with obstruction/lower urinary tract symptoms     Plan: No penile lesions seen on exam today Discussed local care for recurrence of symptoms No evidence of STD Continue tamsulosin for BPH with LUTS Return to office prn   Chief Complaint: Penile lesion   History of Present Illness:  Billy Hall is a 75 y.o. year old male who is seen in consultation from [name], MD  for evaluation of penile lesion.  He noted an area of irritation on the glans following condom use.  He has used over-the-counter antibiotic ointment with resolution of the lesion.  He initially noted this approximately 4 weeks ago.  He did not report any pain associated with the lesion.  No problems with retraction of the foreskin.  He does report some lower urinary tract symptoms including urinary frequency, urgency, nocturia x1.  No dysuria or gross hematuria.  He is currently managed with tamsulosin. AUA score = 5 today   Past Medical History:  Past Medical History:  Diagnosis Date   Arthritis    Depressive disorder    Diabetes mellitus without complication (HCC)    Fatigue    Headache(784.0)    Hyperlipidemia    Hypertension    Hypothyroidism    Snoring     Past Surgical History:  Past Surgical History:  Procedure Laterality Date   COLONOSCOPY N/A 03/08/2013   Procedure: COLONOSCOPY;  Surgeon: Jamesetta So, MD;  [facility];  Service: Gastroenterology;  Laterality: N/A;   IR IMAGING GUIDED PORT INSERTION  03/08/2020   IR US GUIDE BX ASP/DRAIN  03/08/2020   KNEE ARTHROSCOPY Right    2006 By Dr. Luna Glasgow at [facility].    THYROID SURGERY     VIDEO BRONCHOSCOPY WITH ENDOBRONCHIAL ULTRASOUND N/A 01/30/2020   Procedure: VIDEO BRONCHOSCOPY WITH ENDOBRONCHIAL ULTRASOUND;  Surgeon: Melrose Nakayama, MD;  Location: [facility];  Service: Thoracic;  Laterality: N/A;    Allergies:  Allergies  Allergen Reactions   Ace Inhibitors Swelling and Rash     Family History:  Family History  Problem Relation Age of Onset   Alzheimer's disease Mother     Social History:  Social History   Tobacco Use   Smoking status: Former   Smokeless tobacco: Never   Tobacco comments:    Quit 2004  Vaping Use   Vaping Use: Never used  Substance Use Topics   Alcohol use: Not Currently    Alcohol/week: 0.0 standard drinks    Comment: has quit since 2001   Drug use: No    Review of symptoms:  Constitutional:  Negative for unexplained weight loss, night sweats, fever, chills ENT:  Negative for nose bleeds, sinus pain, painful swallowing CV:  Negative for chest pain, shortness of breath, exercise intolerance, palpitations, loss of consciousness Resp:  Negative for cough, wheezing, shortness of breath GI:  Negative for nausea, vomiting, diarrhea, bloody stools GU:  Positives noted in HPI; otherwise negative for gross hematuria, dysuria, urinary incontinence Neuro:  Negative for seizures, poor balance, limb weakness, slurred speech Psych:  Negative for lack of energy, depression, anxiety Endocrine:  Negative for polydipsia, polyuria, symptoms of hypoglycemia (dizziness, hunger, sweating) Hematologic:  Negative for anemia, purpura, petechia, prolonged or excessive bleeding, use of anticoagulants  Allergic:  Negative for difficulty breathing or choking as a result of exposure to anything; no shellfish allergy; no allergic response (rash/itch) to materials, foods  Physical exam BP 123/75   Pulse 79  GENERAL  APPEARANCE:  Well appearing, well developed, well nourished, NAD HEENT: Atraumatic, Normocephalic, oropharynx clear. NECK: Supple without lymphadenopathy or thyromegaly. LUNGS: Clear to auscultation bilaterally. HEART: Regular Rate and Rhythm without murmurs, gallops, or rubs. ABDOMEN: Soft, non-tender, No Masses. EXTREMITIES: Moves all extremities well.  Without clubbing, cyanosis, or edema. NEUROLOGIC:  Alert and oriented x 3, normal gait,  CN II-XII grossly intact.  MENTAL STATUS:  Appropriate. BACK:  Non-tender to palpation.  No CVAT SKIN:  Warm, dry and intact.   GU: Penis:  uncircumcised, no lesions seen Meatus: Normal Scrotum: normal, no masses Testis: normal without masses  Epididymis: normal Prostate: 40 g, NT, no nodules Rectum: Normal tone, normal prostate, no masses or tenderness   Results: Results for orders placed or performed in visit on 01/14/21 (from the past 24 hour(s))  Urinalysis, Routine w reflex microscopic   Collection Time: 01/14/21  9:27 AM  Result Value Ref Range   Specific Gravity, UA 1.015 1.005 - 1.030   pH, UA 7.5 5.0 - 7.5   Color, UA Yellow Yellow   Appearance Ur Clear Clear   Leukocytes,UA Negative Negative   Protein,UA Negative Negative/Trace   Glucose, UA Negative Negative   Ketones, UA Negative Negative   RBC, UA Negative Negative   Bilirubin, UA Negative Negative   Urobilinogen, Ur 1.0 0.2 - 1.0 mg/dL   Nitrite, UA Negative Negative   Microscopic Examination Comment   BLADDER SCAN AMB NON-IMAGING   Collection Time: 01/14/21  9:34 AM  Result Value Ref Range   Scan Result 0

## 2021-01-15 ENCOUNTER — Ambulatory Visit (HOSPITAL_COMMUNITY)
Admission: RE | Admit: 2021-01-15 | Discharge: 2021-01-15 | Disposition: A | Discharge status: Home / Self Care | Payer: Medicare Other | Source: Ambulatory Visit | Attending: Internal Medicine | Admitting: Internal Medicine

## 2021-01-15 ENCOUNTER — Encounter: Payer: Self-pay | Admitting: Urology

## 2021-01-15 DIAGNOSIS — C3490 Malignant neoplasm of unspecified part of unspecified bronchus or lung: Secondary | ICD-10-CM | POA: Insufficient documentation

## 2021-01-15 DIAGNOSIS — I700 Atherosclerosis of aorta: Secondary | ICD-10-CM | POA: Diagnosis not present

## 2021-01-15 MED ORDER — HEPARIN SOD (PORK) LOCK FLUSH 100 UNIT/ML IV SOLN: 500.0000 [IU] | Freq: Once | INTRAVENOUS | Status: AC

## 2021-01-15 MED ORDER — HEPARIN SOD (PORK) LOCK FLUSH 100 UNIT/ML IV SOLN
INTRAVENOUS | Status: AC
  Administered 2021-01-15: 500 [IU] via INTRAVENOUS
  Filled 2021-01-15: qty 5

## 2021-01-15 MED ORDER — IOHEXOL 350 MG/ML SOLN
60.0000 mL | Freq: Once | INTRAVENOUS | Status: AC | PRN
  Administered 2021-01-15: 60 mL via INTRAVENOUS

## 2021-01-17 ENCOUNTER — Inpatient Hospital Stay: Payer: Medicare Other

## 2021-01-17 ENCOUNTER — Other Ambulatory Visit: Payer: Self-pay

## 2021-01-17 ENCOUNTER — Inpatient Hospital Stay: Payer: Medicare Other | Attending: Internal Medicine | Admitting: Physician Assistant

## 2021-01-17 VITALS — BP 126/77 | HR 63 | Temp 97.0°F | Resp 18 | Wt 266.0 lb

## 2021-01-17 DIAGNOSIS — C3411 Malignant neoplasm of upper lobe, right bronchus or lung: Secondary | ICD-10-CM

## 2021-01-17 DIAGNOSIS — J479 Bronchiectasis, uncomplicated: Secondary | ICD-10-CM | POA: Insufficient documentation

## 2021-01-17 DIAGNOSIS — Z95828 Presence of other vascular implants and grafts: Secondary | ICD-10-CM

## 2021-01-17 DIAGNOSIS — I700 Atherosclerosis of aorta: Secondary | ICD-10-CM | POA: Diagnosis not present

## 2021-01-17 DIAGNOSIS — Z5112 Encounter for antineoplastic immunotherapy: Secondary | ICD-10-CM | POA: Diagnosis not present

## 2021-01-17 DIAGNOSIS — Z79899 Other long term (current) drug therapy: Secondary | ICD-10-CM | POA: Insufficient documentation

## 2021-01-17 DIAGNOSIS — R0609 Other forms of dyspnea: Secondary | ICD-10-CM | POA: Diagnosis not present

## 2021-01-17 DIAGNOSIS — Z923 Personal history of irradiation: Secondary | ICD-10-CM | POA: Diagnosis not present

## 2021-01-17 DIAGNOSIS — R1310 Dysphagia, unspecified: Secondary | ICD-10-CM | POA: Insufficient documentation

## 2021-01-17 LAB — CMP (CANCER CENTER ONLY)
ALT: 10 U/L (ref 0–44)
AST: 12 U/L — ABNORMAL LOW (ref 15–41)
Albumin: 3.4 g/dL — ABNORMAL LOW (ref 3.5–5.0)
Alkaline Phosphatase: 73 U/L (ref 38–126)
Anion gap: 8 (ref 5–15)
BUN: 15 mg/dL (ref 8–23)
CO2: 27 mmol/L (ref 22–32)
Calcium: 9.1 mg/dL (ref 8.9–10.3)
Chloride: 106 mmol/L (ref 98–111)
Creatinine: 0.78 mg/dL (ref 0.61–1.24)
GFR, Estimated: >60 mL/min (ref >60)
Glucose, Bld: 111 mg/dL — ABNORMAL HIGH (ref 70–99)
Potassium: 4.1 mmol/L (ref 3.5–5.1)
Sodium: 141 mmol/L (ref 135–145)
Total Bilirubin: 0.4 mg/dL (ref 0.3–1.2)
Total Protein: 6.7 g/dL (ref 6.5–8.1)

## 2021-01-17 LAB — CBC WITH DIFFERENTIAL (CANCER CENTER ONLY)
Abs Immature Granulocytes: 0.01 10*3/uL (ref 0.00–0.07)
Basophils Absolute: 0 10*3/uL (ref 0.0–0.1)
Basophils Relative: 1 %
Eosinophils Absolute: 0.1 10*3/uL (ref 0.0–0.5)
Eosinophils Relative: 2 %
HCT: 40.4 % (ref 39.0–52.0)
Hemoglobin: 13.1 g/dL (ref 13.0–17.0)
Immature Granulocytes: 0 %
Lymphocytes Relative: 19 %
Lymphs Abs: 0.9 10*3/uL (ref 0.7–4.0)
MCH: 26.8 pg (ref 26.0–34.0)
MCHC: 32.4 g/dL (ref 30.0–36.0)
MCV: 82.6 fL (ref 80.0–100.0)
Monocytes Absolute: 0.6 10*3/uL (ref 0.1–1.0)
Monocytes Relative: 13 %
Neutro Abs: 2.9 10*3/uL (ref 1.7–7.7)
Neutrophils Relative %: 65 %
Platelet Count: 171 10*3/uL (ref 150–400)
RBC: 4.89 MIL/uL (ref 4.22–5.81)
RDW: 16.3 % — ABNORMAL HIGH (ref 11.5–15.5)
WBC Count: 4.5 10*3/uL (ref 4.0–10.5)
nRBC: 0 % (ref 0.0–0.2)

## 2021-01-17 LAB — TSH: TSH: 3.152 u[IU]/mL (ref 0.320–4.118)

## 2021-01-17 MED ORDER — SODIUM CHLORIDE 0.9 % IV SOLN: Freq: Once | INTRAVENOUS | Status: AC

## 2021-01-17 MED ORDER — SODIUM CHLORIDE 0.9 % IV SOLN
1500.0000 mg | Freq: Once | INTRAVENOUS | Status: AC
  Administered 2021-01-17: 1500 mg via INTRAVENOUS
  Filled 2021-01-17: qty 30

## 2021-01-17 MED ORDER — HEPARIN SOD (PORK) LOCK FLUSH 100 UNIT/ML IV SOLN
500.0000 [IU] | Freq: Once | INTRAVENOUS | Status: AC | PRN
  Administered 2021-01-17: 500 [IU]

## 2021-01-17 MED ORDER — SODIUM CHLORIDE 0.9% FLUSH
10.0000 mL | Freq: Once | INTRAVENOUS | Status: AC
  Administered 2021-01-17: 10 mL

## 2021-01-17 MED ORDER — SODIUM CHLORIDE 0.9% FLUSH
10.0000 mL | INTRAVENOUS | Status: DC | PRN
Start: 2021-01-17 — End: 2021-01-17
  Administered 2021-01-17: 10 mL

## 2021-01-17 NOTE — Patient Instructions (Signed)
[facility]ICAL [facility]COL[facility]  Discharge Instructions: Thank you for choosing [facility] to provide your oncology and hematology care.   If you have a lab appointment with the Cancer Center, please go directly to the Cancer Center and check in at the registration area.   Wear comfortable clothing and clothing appropriate for easy access to any Portacath or PICC line.   We strive to give you quality time with your provider. You may need to reschedule your appointment if you arrive late (15 or more minutes).  Arriving late affects you and other patients whose appointments are after yours.  Also, if you miss three or more appointments without notifying the office, you may be dismissed from the clinic at the provider's discretion.      For prescription refill requests, have your pharmacy contact our office and allow 72 hours for refills to be completed.    Today you received the following chemotherapy and/or immunotherapy agents Imfinzi      To help prevent nausea and vomiting after your treatment, we encourage you to take your nausea medication as directed.  BELOW ARE SYMPTOMS THAT SHOULD BE REPORTED IMMEDIATELY: *FEVER GREATER THAN 100.4 F (38 C) OR HIGHER *CHILLS OR SWEATING *NAUSEA AND VOMITING THAT IS NOT CONTROLLED WITH YOUR NAUSEA MEDICATION *UNUSUAL SHORTNESS OF BREATH *UNUSUAL BRUISING OR BLEEDING *URINARY PROBLEMS (pain or burning when urinating, or frequent urination) *BOWEL PROBLEMS (unusual diarrhea, constipation, pain near the anus) TENDERNESS IN MOUTH AND THROAT WITH OR WITHOUT PRESENCE OF ULCERS (sore throat, sores in mouth, or a toothache) UNUSUAL RASH, SWELLING OR PAIN  UNUSUAL VAGINAL DISCHARGE OR ITCHING   Items with * indicate a potential emergency and should be followed up as soon as possible or go to the Emergency Department if any problems should occur.  Please show the CHEMOTHERAPY ALERT CARD or IMMUNOTHERAPY ALERT CARD at check-in to the  Emergency Department and triage nurse.  Should you have questions after your visit or need to cancel or reschedule your appointment, please contact [facility]  Dept: [phone]  and follow the prompts.  Office hours are 8:00 a.m. to 4:30 p.m. Monday - Friday. Please note that voicemails left after 4:00 p.m. may not be returned until the following business day.  We are closed weekends and major holidays. You have access to a nurse at all times for urgent questions. Please call the main number to the clinic Dept: [phone] and follow the prompts.   For any non-urgent questions, you may also contact your provider using MyChart. We now offer e-Visits for anyone 18 and older to request care online for non-urgent symptoms. For details visit [email or web address].   Also download the MyChart app! Go to the app store, search "MyChart", open the app, select [facility], and log in with your MyChart username and password.  Due to Covid, a mask is required upon entering the hospital/clinic. If you do not have a mask, one will be given to you upon arrival. For doctor visits, patients may have 1 support person aged 18 or older with them. For treatment visits, patients cannot have anyone with them due to current Covid guidelines and our immunocompromised population.   

## 2021-01-18 ENCOUNTER — Encounter: Payer: Self-pay | Admitting: Internal Medicine

## 2021-01-24 ENCOUNTER — Telehealth: Payer: Self-pay | Admitting: Medical Oncology

## 2021-01-24 NOTE — Telephone Encounter (Signed)
'  BRB in stool" last two mornings. A little blood on the tissue. Last episode like this was last year.  Denies itching , pain with defecation.  Saw urology last week who examined a "spot on his penis " and did did rectal exam.   Per Dr Julien Nordmann I instructed pt to call his family doctors office now.

## 2021-01-25 DIAGNOSIS — Z23 Encounter for immunization: Secondary | ICD-10-CM | POA: Diagnosis not present

## 2021-01-25 DIAGNOSIS — E118 Type 2 diabetes mellitus with unspecified complications: Secondary | ICD-10-CM | POA: Diagnosis not present

## 2021-01-25 DIAGNOSIS — E039 Hypothyroidism, unspecified: Secondary | ICD-10-CM | POA: Diagnosis not present

## 2021-01-25 DIAGNOSIS — C3411 Malignant neoplasm of upper lobe, right bronchus or lung: Secondary | ICD-10-CM | POA: Diagnosis not present

## 2021-01-25 DIAGNOSIS — E782 Mixed hyperlipidemia: Secondary | ICD-10-CM | POA: Diagnosis not present

## 2021-01-25 DIAGNOSIS — I10 Essential (primary) hypertension: Secondary | ICD-10-CM | POA: Diagnosis not present

## 2021-01-25 DIAGNOSIS — K648 Other hemorrhoids: Secondary | ICD-10-CM | POA: Diagnosis not present

## 2021-01-28 DIAGNOSIS — Z23 Encounter for immunization: Secondary | ICD-10-CM | POA: Diagnosis not present

## 2021-01-28 DIAGNOSIS — E782 Mixed hyperlipidemia: Secondary | ICD-10-CM | POA: Diagnosis not present

## 2021-01-28 DIAGNOSIS — E789 Disorder of lipoprotein metabolism, unspecified: Secondary | ICD-10-CM | POA: Diagnosis not present

## 2021-01-28 DIAGNOSIS — E039 Hypothyroidism, unspecified: Secondary | ICD-10-CM | POA: Diagnosis not present

## 2021-01-28 DIAGNOSIS — R7989 Other specified abnormal findings of blood chemistry: Secondary | ICD-10-CM | POA: Diagnosis not present

## 2021-01-28 DIAGNOSIS — C3411 Malignant neoplasm of upper lobe, right bronchus or lung: Secondary | ICD-10-CM | POA: Diagnosis not present

## 2021-01-28 DIAGNOSIS — R7309 Other abnormal glucose: Secondary | ICD-10-CM | POA: Diagnosis not present

## 2021-02-14 ENCOUNTER — Inpatient Hospital Stay: Payer: Medicare Other

## 2021-02-14 ENCOUNTER — Inpatient Hospital Stay: Payer: Medicare Other | Attending: Internal Medicine | Admitting: Internal Medicine

## 2021-02-14 ENCOUNTER — Encounter: Payer: Self-pay | Admitting: Internal Medicine

## 2021-02-14 ENCOUNTER — Other Ambulatory Visit: Payer: Self-pay

## 2021-02-14 VITALS — BP 136/74 | HR 65 | Temp 98.5°F | Resp 20 | Ht 69.0 in | Wt 271.0 lb

## 2021-02-14 DIAGNOSIS — C3411 Malignant neoplasm of upper lobe, right bronchus or lung: Secondary | ICD-10-CM | POA: Diagnosis not present

## 2021-02-14 DIAGNOSIS — R5383 Other fatigue: Secondary | ICD-10-CM | POA: Diagnosis not present

## 2021-02-14 DIAGNOSIS — Z79899 Other long term (current) drug therapy: Secondary | ICD-10-CM | POA: Diagnosis not present

## 2021-02-14 DIAGNOSIS — Z5112 Encounter for antineoplastic immunotherapy: Secondary | ICD-10-CM

## 2021-02-14 DIAGNOSIS — R0609 Other forms of dyspnea: Secondary | ICD-10-CM | POA: Diagnosis not present

## 2021-02-14 DIAGNOSIS — Z95828 Presence of other vascular implants and grafts: Secondary | ICD-10-CM

## 2021-02-14 DIAGNOSIS — R1310 Dysphagia, unspecified: Secondary | ICD-10-CM | POA: Insufficient documentation

## 2021-02-14 DIAGNOSIS — I700 Atherosclerosis of aorta: Secondary | ICD-10-CM | POA: Insufficient documentation

## 2021-02-14 DIAGNOSIS — J479 Bronchiectasis, uncomplicated: Secondary | ICD-10-CM | POA: Diagnosis not present

## 2021-02-14 DIAGNOSIS — Z923 Personal history of irradiation: Secondary | ICD-10-CM | POA: Insufficient documentation

## 2021-02-14 LAB — CBC WITH DIFFERENTIAL (CANCER CENTER ONLY)
Abs Immature Granulocytes: 0.01 10*3/uL (ref 0.00–0.07)
Basophils Absolute: 0 10*3/uL (ref 0.0–0.1)
Basophils Relative: 1 %
Eosinophils Absolute: 0.1 10*3/uL (ref 0.0–0.5)
Eosinophils Relative: 3 %
HCT: 39.1 % (ref 39.0–52.0)
Hemoglobin: 12.5 g/dL — ABNORMAL LOW (ref 13.0–17.0)
Immature Granulocytes: 0 %
Lymphocytes Relative: 20 %
Lymphs Abs: 0.9 10*3/uL (ref 0.7–4.0)
MCH: 26.2 pg (ref 26.0–34.0)
MCHC: 32 g/dL (ref 30.0–36.0)
MCV: 82 fL (ref 80.0–100.0)
Monocytes Absolute: 0.6 10*3/uL (ref 0.1–1.0)
Monocytes Relative: 14 %
Neutro Abs: 2.7 10*3/uL (ref 1.7–7.7)
Neutrophils Relative %: 62 %
Platelet Count: 191 10*3/uL (ref 150–400)
RBC: 4.77 MIL/uL (ref 4.22–5.81)
RDW: 15.7 % — ABNORMAL HIGH (ref 11.5–15.5)
WBC Count: 4.3 10*3/uL (ref 4.0–10.5)
nRBC: 0 % (ref 0.0–0.2)

## 2021-02-14 LAB — CMP (CANCER CENTER ONLY)
ALT: 9 U/L (ref 0–44)
AST: 14 U/L — ABNORMAL LOW (ref 15–41)
Albumin: 3.4 g/dL — ABNORMAL LOW (ref 3.5–5.0)
Alkaline Phosphatase: 65 U/L (ref 38–126)
Anion gap: 8 (ref 5–15)
BUN: 12 mg/dL (ref 8–23)
CO2: 27 mmol/L (ref 22–32)
Calcium: 8.9 mg/dL (ref 8.9–10.3)
Chloride: 105 mmol/L (ref 98–111)
Creatinine: 0.76 mg/dL (ref 0.61–1.24)
GFR, Estimated: >60 mL/min (ref >60)
Glucose, Bld: 109 mg/dL — ABNORMAL HIGH (ref 70–99)
Potassium: 4 mmol/L (ref 3.5–5.1)
Sodium: 140 mmol/L (ref 135–145)
Total Bilirubin: 0.6 mg/dL (ref 0.3–1.2)
Total Protein: 6.8 g/dL (ref 6.5–8.1)

## 2021-02-14 LAB — TSH: TSH: 4.413 u[IU]/mL — ABNORMAL HIGH (ref 0.320–4.118)

## 2021-02-14 MED ORDER — HEPARIN SOD (PORK) LOCK FLUSH 100 UNIT/ML IV SOLN
500.0000 [IU] | Freq: Once | INTRAVENOUS | Status: AC | PRN
  Administered 2021-02-14: 500 [IU]

## 2021-02-14 MED ORDER — SODIUM CHLORIDE 0.9 % IV SOLN: Freq: Once | INTRAVENOUS | Status: AC

## 2021-02-14 MED ORDER — SODIUM CHLORIDE 0.9% FLUSH
10.0000 mL | Freq: Once | INTRAVENOUS | Status: AC
  Administered 2021-02-14: 10 mL

## 2021-02-14 MED ORDER — SODIUM CHLORIDE 0.9% FLUSH
10.0000 mL | INTRAVENOUS | Status: DC | PRN
  Administered 2021-02-14: 10 mL

## 2021-02-14 MED ORDER — SODIUM CHLORIDE 0.9 % IV SOLN
1500.0000 mg | Freq: Once | INTRAVENOUS | Status: AC
  Administered 2021-02-14: 1500 mg via INTRAVENOUS
  Filled 2021-02-14: qty 30

## 2021-02-14 NOTE — Progress Notes (Signed)
[place] Telephone:(336) [phone]   Fax:(336) [phone]  OFFICE PROGRESS NOTE  [name], MD [place] [address]  DIAGNOSIS: Stage IIIA (T4, N0, M0) non-small cell lung cancer, squamous cell carcinoma presented with large right upper lobe lung mass diagnosed in October 2021.   PRIOR THERAPY: Concurrent chemoradiation with weekly carboplatin for AUC of 2 and paclitaxel 45 mg/M2. Status post 6 cycles. First dose on 02/27/20.  Last dose was given April 02, 2020.   CURRENT THERAPY:  Consolidation treatment with immunotherapy with Imfinzi 1500 mg IV every 4 weeks.  First dose May 10, 2020.  Status post 10 cycles.  INTERVAL HISTORY: Billy Hall 75 y.o. male returns to the clinic today for follow-up visit.  The patient is feeling fine today with no concerning complaints except for mild fatigue and shortness of breath with exertion.  He denied having any chest pain, cough or hemoptysis.  He denied having any recent weight loss or night sweats.  He has no nausea, vomiting, diarrhea or constipation.  He has no headache or visual changes.  He is here today for evaluation before starting cycle #11 of his consolidation treatment with immunotherapy.  MEDICAL HISTORY: Past Medical History:  Diagnosis Date   Arthritis    Depressive disorder    Diabetes mellitus without complication (HCC)    Fatigue    Headache(784.0)    Hyperlipidemia    Hypertension    Hypothyroidism    Snoring     ALLERGIES:  is allergic to ace inhibitors.  MEDICATIONS:  Current Outpatient Medications  Medication Sig Dispense Refill   acetaminophen (TYLENOL) 650 MG CR tablet Take 650 mg by mouth every 8 (eight) hours as needed for pain.     aspirin EC 81 MG tablet Take 81 mg by mouth daily.     cholecalciferol (VITAMIN D3) 25 MCG (1000 UNIT) tablet Take 1,000 Units by mouth daily.     levothyroxine (SYNTHROID) 88 MCG tablet Take 88 mcg by mouth daily before  breakfast.      losartan (COZAAR) 50 MG tablet Take 50 mg by mouth daily.      metFORMIN (GLUCOPHAGE) 500 MG tablet Take 500 mg by mouth 2 (two) times daily with a meal.      ONETOUCH ULTRA test strip 1 each 2 (two) times daily.     sildenafil (REVATIO) 20 MG tablet Take 20 mg by mouth daily as needed (ED).      simvastatin (ZOCOR) 10 MG tablet Take 10 mg by mouth every evening.      tamsulosin (FLOMAX) 0.4 MG CAPS capsule Take 0.4 mg by mouth daily.     No current facility-administered medications for this visit.    SURGICAL HISTORY:  Past Surgical History:  Procedure Laterality Date   COLONOSCOPY N/A 03/08/2013   Procedure: COLONOSCOPY;  Surgeon: Jamesetta So, MD;  [facility];  Service: Gastroenterology;  Laterality: N/A;   IR IMAGING GUIDED PORT INSERTION  03/08/2020   IR US GUIDE BX ASP/DRAIN  03/08/2020   KNEE ARTHROSCOPY Right    2006 By Dr. Luna Glasgow at [facility]    THYROID SURGERY     VIDEO BRONCHOSCOPY WITH ENDOBRONCHIAL ULTRASOUND N/A 01/30/2020   Procedure: VIDEO BRONCHOSCOPY WITH ENDOBRONCHIAL ULTRASOUND;  Surgeon: Melrose Nakayama, MD;  Location: [facility] OR;  Service: Thoracic;  Laterality: N/A;    REVIEW OF SYSTEMS:  A comprehensive review of systems was negative except for: Constitutional: positive for fatigue Respiratory: positive  for dyspnea on exertion   PHYSICAL EXAMINATION: General appearance: alert, cooperative, and no distress Head: Normocephalic, without obvious abnormality, atraumatic Neck: no adenopathy, no JVD, supple, symmetrical, trachea midline, and thyroid not enlarged, symmetric, no tenderness/mass/nodules Lymph nodes: Cervical, supraclavicular, and axillary nodes normal. Resp: clear to auscultation bilaterally Back: symmetric, no curvature. ROM normal. No CVA tenderness. Cardio: regular rate and rhythm, S1, S2 normal, no murmur, click, rub or gallop GI: soft, non-tender; bowel sounds normal; no masses,  no organomegaly Extremities:  extremities normal, atraumatic, no cyanosis or edema  ECOG PERFORMANCE STATUS: 1 - Symptomatic but completely ambulatory  Blood pressure 136/74, pulse 65, temperature 98.5 F (36.9 C), temperature source Oral, resp. rate 20, height 5\' 9"  (1.753 m), weight 271 lb (122.9 kg), SpO2 97 %.  LABORATORY DATA: Lab Results  Component Value Date   WBC 4.3 02/14/2021   HGB 12.5 (L) 02/14/2021   HCT 39.1 02/14/2021   MCV 82.0 02/14/2021   PLT 191 02/14/2021      Chemistry      Component Value Date/Time   NA 141 01/17/2021 0944   K 4.1 01/17/2021 0944   CL 106 01/17/2021 0944   CO2 27 01/17/2021 0944   BUN 15 01/17/2021 0944   CREATININE 0.78 01/17/2021 0944      Component Value Date/Time   CALCIUM 9.1 01/17/2021 0944   ALKPHOS 73 01/17/2021 0944   AST 12 (L) 01/17/2021 0944   ALT 10 01/17/2021 0944   BILITOT 0.4 01/17/2021 0944       RADIOGRAPHIC STUDIES: CT Chest W Contrast  Result Date: 01/16/2021 CLINICAL DATA:  Lung cancer. EXAM: CT CHEST WITH CONTRAST TECHNIQUE: Multidetector CT imaging of the chest was performed during intravenous contrast administration. CONTRAST:  48mL OMNIPAQUE IOHEXOL 350 MG/ML SOLN COMPARISON:  10/23/2020. FINDINGS: Cardiovascular: Right IJ Port-A-Cath terminates in the right atrium. Atherosclerotic calcification of the aorta and coronary arteries. Pulmonic trunk is enlarged. Chronically thrombosed right upper lobe pulmonary artery. Heart is at the upper limits of normal in size to mildly enlarged. No pericardial effusion. Mediastinum/Nodes: No pathologically enlarged mediastinal, hilar or axillary lymph nodes. Esophagus is grossly unremarkable. Lungs/Pleura: Pleural thickening, cavitation and post radiation pulmonary retraction/bronchiectasis in the upper right hemithorax, unchanged. Pleuroparenchymal scarring in both hemithoraces. Trace right pleural fluid, unchanged. Airway is unremarkable. Upper Abdomen: Liver appears slightly decreased in attenuation  diffusely. Visualized portions of the liver, gallbladder and right adrenal gland are otherwise unremarkable. Thickening of the left adrenal gland. 1.8 cm low-density lesion in the upper pole right kidney, similar. Visualized portions of the kidneys, spleen, pancreas, stomach and bowel are otherwise unremarkable. Musculoskeletal: Degenerative changes in the spine. No worrisome lytic or sclerotic lesions. IMPRESSION: 1. Post treatment scarring in the upper right hemithorax, stable. No evidence of recurrent or metastatic disease. 2. Trace right pleural fluid, stable. 3. Liver appears steatotic. 4. Aortic atherosclerosis (ICD10-I70.0). Coronary artery calcification. 5. Enlarged pulmonic trunk, indicative of pulmonary arterial hypertension. Electronically Signed   By: Lorin Picket M.D.   On: 01/16/2021 15:15    ASSESSMENT AND PLAN: This is a very pleasant 75 years old African-American male recently diagnosed with a stage IIIA non-small cell lung cancer, squamous cell carcinoma presented with large right upper lobe lung mass in October 2021. The patient underwent a course of concurrent chemoradiation with weekly carboplatin and paclitaxel status post 6 cycles.  The patient tolerated this course of treatment fairly well except for mild odynophagia. Imaging studies after the induction phase of concurrent chemoradiation showed interval decrease  in the size and significant central cavitation of the previously demonstrated right upper lobe lung mass consistent with response to therapy. The patient is currently undergoing consolidation treatment with immunotherapy with Imfinzi 1500 mg IV every 4 weeks status post 10 cycles.   The patient continues to tolerate his treatment fairly well with no concerning adverse effects. I recommended for him to proceed with cycle #11 of his consolidation therapy today as planned. I will see him back for follow-up visit in 4 weeks for evaluation before starting cycle #12. He was  advised to call immediately if he has any other concerning symptoms in the interval. The patient voices understanding of current disease status and treatment options and is in agreement with the current care plan.  All questions were answered. The patient knows to call the clinic with any problems, questions or concerns. We can certainly see the patient much sooner if necessary.   Disclaimer: This note was dictated with voice recognition software. Similar sounding words can inadvertently be transcribed and may not be corrected upon review.

## 2021-02-14 NOTE — Patient Instructions (Signed)
[facility]  Discharge Instructions: Thank you for choosing [facility] to provide your oncology and hematology care.   If you have a lab appointment with the Cancer Center, please go directly to the Cancer Center and check in at the registration area.   Wear comfortable clothing and clothing appropriate for easy access to any Portacath or PICC line.   We strive to give you quality time with your provider. You may need to reschedule your appointment if you arrive late (15 or more minutes).  Arriving late affects you and other patients whose appointments are after yours.  Also, if you miss three or more appointments without notifying the office, you may be dismissed from the clinic at the provider's discretion.      For prescription refill requests, have your pharmacy contact our office and allow 72 hours for refills to be completed.    Today you received the following chemotherapy and/or immunotherapy agents Imfinzi      To help prevent nausea and vomiting after your treatment, we encourage you to take your nausea medication as directed.  BELOW ARE SYMPTOMS THAT SHOULD BE REPORTED IMMEDIATELY: *FEVER GREATER THAN 100.4 F (38 C) OR HIGHER *CHILLS OR SWEATING *NAUSEA AND VOMITING THAT IS NOT CONTROLLED WITH YOUR NAUSEA MEDICATION *UNUSUAL SHORTNESS OF BREATH *UNUSUAL BRUISING OR BLEEDING *URINARY PROBLEMS (pain or burning when urinating, or frequent urination) *BOWEL PROBLEMS (unusual diarrhea, constipation, pain near the anus) TENDERNESS IN MOUTH AND THROAT WITH OR WITHOUT PRESENCE OF ULCERS (sore throat, sores in mouth, or a toothache) UNUSUAL RASH, SWELLING OR PAIN  UNUSUAL VAGINAL DISCHARGE OR ITCHING   Items with * indicate a potential emergency and should be followed up as soon as possible or go to the Emergency Department if any problems should occur.  Please show the CHEMOTHERAPY ALERT CARD or IMMUNOTHERAPY ALERT CARD at check-in to the  Emergency Department and triage nurse.  Should you have questions after your visit or need to cancel or reschedule your appointment, please contact [facility]  Dept: [phone]  and follow the prompts.  Office hours are 8:00 a.m. to 4:30 p.m. Monday - Friday. Please note that voicemails left after 4:00 p.m. may not be returned until the following business day.  We are closed weekends and major holidays. You have access to a nurse at all times for urgent questions. Please call the main number to the clinic Dept: [phone] and follow the prompts.   For any non-urgent questions, you may also contact your provider using MyChart. We now offer e-Visits for anyone 18 and older to request care online for non-urgent symptoms. For details visit [email or web address].   Also download the MyChart app! Go to the app store, search "MyChart", open the app, select [facility], and log in with your MyChart username and password.  Due to Covid, a mask is required upon entering the hospital/clinic. If you do not have a mask, one will be given to you upon arrival. For doctor visits, patients may have 1 support person aged 18 or older with them. For treatment visits, patients cannot have anyone with them due to current Covid guidelines and our immunocompromised population.   

## 2021-03-14 ENCOUNTER — Other Ambulatory Visit: Payer: Self-pay

## 2021-03-14 ENCOUNTER — Inpatient Hospital Stay: Payer: Medicare Other

## 2021-03-14 ENCOUNTER — Inpatient Hospital Stay (HOSPITAL_BASED_OUTPATIENT_CLINIC_OR_DEPARTMENT_OTHER): Payer: Medicare Other | Admitting: Internal Medicine

## 2021-03-14 ENCOUNTER — Inpatient Hospital Stay: Payer: Medicare Other | Attending: Internal Medicine

## 2021-03-14 VITALS — BP 107/77 | HR 87 | Temp 96.2°F | Resp 18 | Ht 69.0 in | Wt 272.3 lb

## 2021-03-14 DIAGNOSIS — R5383 Other fatigue: Secondary | ICD-10-CM | POA: Diagnosis not present

## 2021-03-14 DIAGNOSIS — Z95828 Presence of other vascular implants and grafts: Secondary | ICD-10-CM

## 2021-03-14 DIAGNOSIS — R0609 Other forms of dyspnea: Secondary | ICD-10-CM | POA: Insufficient documentation

## 2021-03-14 DIAGNOSIS — Z79899 Other long term (current) drug therapy: Secondary | ICD-10-CM | POA: Insufficient documentation

## 2021-03-14 DIAGNOSIS — C3411 Malignant neoplasm of upper lobe, right bronchus or lung: Secondary | ICD-10-CM | POA: Diagnosis not present

## 2021-03-14 DIAGNOSIS — Z923 Personal history of irradiation: Secondary | ICD-10-CM | POA: Diagnosis not present

## 2021-03-14 DIAGNOSIS — Z5112 Encounter for antineoplastic immunotherapy: Secondary | ICD-10-CM | POA: Diagnosis not present

## 2021-03-14 DIAGNOSIS — R1310 Dysphagia, unspecified: Secondary | ICD-10-CM | POA: Diagnosis not present

## 2021-03-14 LAB — CBC WITH DIFFERENTIAL (CANCER CENTER ONLY)
Abs Immature Granulocytes: 0.02 10*3/uL (ref 0.00–0.07)
Basophils Absolute: 0 10*3/uL (ref 0.0–0.1)
Basophils Relative: 1 %
Eosinophils Absolute: 0.1 10*3/uL (ref 0.0–0.5)
Eosinophils Relative: 2 %
HCT: 38 % — ABNORMAL LOW (ref 39.0–52.0)
Hemoglobin: 12.5 g/dL — ABNORMAL LOW (ref 13.0–17.0)
Immature Granulocytes: 0 %
Lymphocytes Relative: 14 %
Lymphs Abs: 0.8 10*3/uL (ref 0.7–4.0)
MCH: 26.8 pg (ref 26.0–34.0)
MCHC: 32.9 g/dL (ref 30.0–36.0)
MCV: 81.5 fL (ref 80.0–100.0)
Monocytes Absolute: 0.6 10*3/uL (ref 0.1–1.0)
Monocytes Relative: 10 %
Neutro Abs: 4.2 10*3/uL (ref 1.7–7.7)
Neutrophils Relative %: 73 %
Platelet Count: 176 10*3/uL (ref 150–400)
RBC: 4.66 MIL/uL (ref 4.22–5.81)
RDW: 15.8 % — ABNORMAL HIGH (ref 11.5–15.5)
WBC Count: 5.7 10*3/uL (ref 4.0–10.5)
nRBC: 0 % (ref 0.0–0.2)

## 2021-03-14 LAB — CMP (CANCER CENTER ONLY)
ALT: 10 U/L (ref 0–44)
AST: 14 U/L — ABNORMAL LOW (ref 15–41)
Albumin: 3.4 g/dL — ABNORMAL LOW (ref 3.5–5.0)
Alkaline Phosphatase: 72 U/L (ref 38–126)
Anion gap: 9 (ref 5–15)
BUN: 16 mg/dL (ref 8–23)
CO2: 26 mmol/L (ref 22–32)
Calcium: 8.7 mg/dL — ABNORMAL LOW (ref 8.9–10.3)
Chloride: 104 mmol/L (ref 98–111)
Creatinine: 0.86 mg/dL (ref 0.61–1.24)
GFR, Estimated: >60 mL/min (ref >60)
Glucose, Bld: 117 mg/dL — ABNORMAL HIGH (ref 70–99)
Potassium: 4 mmol/L (ref 3.5–5.1)
Sodium: 139 mmol/L (ref 135–145)
Total Bilirubin: 0.7 mg/dL (ref 0.3–1.2)
Total Protein: 6.7 g/dL (ref 6.5–8.1)

## 2021-03-14 LAB — TSH: TSH: 4.218 u[IU]/mL — ABNORMAL HIGH (ref 0.320–4.118)

## 2021-03-14 MED ORDER — SODIUM CHLORIDE 0.9% FLUSH
10.0000 mL | INTRAVENOUS | Status: DC | PRN
  Administered 2021-03-14: 12:00:00 10 mL

## 2021-03-14 MED ORDER — SODIUM CHLORIDE 0.9% FLUSH
10.0000 mL | Freq: Once | INTRAVENOUS | Status: AC
  Administered 2021-03-14: 08:00:00 10 mL

## 2021-03-14 MED ORDER — SODIUM CHLORIDE 0.9 % IV SOLN
Freq: Once | INTRAVENOUS | Status: AC
Start: 2021-03-14 — End: 2021-03-14

## 2021-03-14 MED ORDER — HEPARIN SOD (PORK) LOCK FLUSH 100 UNIT/ML IV SOLN
500.0000 [IU] | Freq: Once | INTRAVENOUS | Status: AC | PRN
  Administered 2021-03-14: 12:00:00 500 [IU]

## 2021-03-14 MED ORDER — SODIUM CHLORIDE 0.9 % IV SOLN
1500.0000 mg | Freq: Once | INTRAVENOUS | Status: AC
  Administered 2021-03-14: 11:00:00 1500 mg via INTRAVENOUS
  Filled 2021-03-14: qty 30

## 2021-03-14 NOTE — Patient Instructions (Signed)
[facility]  Discharge Instructions: Thank you for choosing [facility] to provide your oncology and hematology care.   If you have a lab appointment with the Cancer Center, please go directly to the Cancer Center and check in at the registration area.   Wear comfortable clothing and clothing appropriate for easy access to any Portacath or PICC line.   We strive to give you quality time with your provider. You may need to reschedule your appointment if you arrive late (15 or more minutes).  Arriving late affects you and other patients whose appointments are after yours.  Also, if you miss three or more appointments without notifying the office, you may be dismissed from the clinic at the provider's discretion.      For prescription refill requests, have your pharmacy contact our office and allow 72 hours for refills to be completed.    Today you received the following chemotherapy and/or immunotherapy agents Imfinzi      To help prevent nausea and vomiting after your treatment, we encourage you to take your nausea medication as directed.  BELOW ARE SYMPTOMS THAT SHOULD BE REPORTED IMMEDIATELY: *FEVER GREATER THAN 100.4 F (38 C) OR HIGHER *CHILLS OR SWEATING *NAUSEA AND VOMITING THAT IS NOT CONTROLLED WITH YOUR NAUSEA MEDICATION *UNUSUAL SHORTNESS OF BREATH *UNUSUAL BRUISING OR BLEEDING *URINARY PROBLEMS (pain or burning when urinating, or frequent urination) *BOWEL PROBLEMS (unusual diarrhea, constipation, pain near the anus) TENDERNESS IN MOUTH AND THROAT WITH OR WITHOUT PRESENCE OF ULCERS (sore throat, sores in mouth, or a toothache) UNUSUAL RASH, SWELLING OR PAIN  UNUSUAL VAGINAL DISCHARGE OR ITCHING   Items with * indicate a potential emergency and should be followed up as soon as possible or go to the Emergency Department if any problems should occur.  Please show the CHEMOTHERAPY ALERT CARD or IMMUNOTHERAPY ALERT CARD at check-in to the  Emergency Department and triage nurse.  Should you have questions after your visit or need to cancel or reschedule your appointment, please contact [facility]  Dept: [phone]  and follow the prompts.  Office hours are 8:00 a.m. to 4:30 p.m. Monday - Friday. Please note that voicemails left after 4:00 p.m. may not be returned until the following business day.  We are closed weekends and major holidays. You have access to a nurse at all times for urgent questions. Please call the main number to the clinic Dept: [phone] and follow the prompts.   For any non-urgent questions, you may also contact your provider using MyChart. We now offer e-Visits for anyone 18 and older to request care online for non-urgent symptoms. For details visit [email or web address].   Also download the MyChart app! Go to the app store, search "MyChart", open the app, select [facility], and log in with your MyChart username and password.  Due to Covid, a mask is required upon entering the hospital/clinic. If you do not have a mask, one will be given to you upon arrival. For doctor visits, patients may have 1 support person aged 18 or older with them. For treatment visits, patients cannot have anyone with them due to current Covid guidelines and our immunocompromised population.   

## 2021-03-14 NOTE — Progress Notes (Signed)
[place] Telephone:(336) [phone]   Fax:(336) [phone]  OFFICE PROGRESS NOTE  [name], MD [place] [address]  DIAGNOSIS: Stage IIIA (T4, N0, M0) non-small cell lung cancer, squamous cell carcinoma presented with large right upper lobe lung mass diagnosed in October 2021.   PRIOR THERAPY: Concurrent chemoradiation with weekly carboplatin for AUC of 2 and paclitaxel 45 mg/M2. Status post 6 cycles. First dose on 02/27/20.  Last dose was given April 02, 2020.   CURRENT THERAPY:  Consolidation treatment with immunotherapy with Imfinzi 1500 mg IV every 4 weeks.  First dose May 10, 2020.  Status post 11 cycles.  INTERVAL HISTORY: Billy Hall 75 y.o. male returns to the clinic today for follow-up visit.  The patient is feeling fine today with no concerning complaints.  He denied having any current chest pain, shortness of breath, cough or hemoptysis.  He has no nausea, vomiting, diarrhea or constipation.  He has no headache or visual changes.  He denied having any significant weight loss or night sweats.  He continues to tolerate his treatment with consolidation immunotherapy fairly well.  He is here today for evaluation before starting cycle #12.  MEDICAL HISTORY: Past Medical History:  Diagnosis Date   Arthritis    Depressive disorder    Diabetes mellitus without complication (HCC)    Fatigue    Headache(784.0)    Hyperlipidemia    Hypertension    Hypothyroidism    Snoring     ALLERGIES:  is allergic to ace inhibitors.  MEDICATIONS:  Current Outpatient Medications  Medication Sig Dispense Refill   acetaminophen (TYLENOL) 650 MG CR tablet Take 650 mg by mouth every 8 (eight) hours as needed for pain.     aspirin EC 81 MG tablet Take 81 mg by mouth daily.     cholecalciferol (VITAMIN D3) 25 MCG (1000 UNIT) tablet Take 1,000 Units by mouth daily.     levothyroxine (SYNTHROID) 88 MCG tablet Take 88 mcg by mouth daily  before breakfast.      losartan (COZAAR) 50 MG tablet Take 50 mg by mouth daily.      metFORMIN (GLUCOPHAGE) 500 MG tablet Take 500 mg by mouth 2 (two) times daily with a meal.      ONETOUCH ULTRA test strip 1 each 2 (two) times daily.     sildenafil (REVATIO) 20 MG tablet Take 20 mg by mouth daily as needed (ED).      simvastatin (ZOCOR) 10 MG tablet Take 10 mg by mouth every evening.      tamsulosin (FLOMAX) 0.4 MG CAPS capsule Take 0.4 mg by mouth daily.     No current facility-administered medications for this visit.    SURGICAL HISTORY:  Past Surgical History:  Procedure Laterality Date   COLONOSCOPY N/A 03/08/2013   Procedure: COLONOSCOPY;  Surgeon: Jamesetta So, MD;  [facility];  Service: Gastroenterology;  Laterality: N/A;   IR IMAGING GUIDED PORT INSERTION  03/08/2020   IR US GUIDE BX ASP/DRAIN  03/08/2020   KNEE ARTHROSCOPY Right    2006 By Dr. Luna Glasgow at [facility].    THYROID SURGERY     VIDEO BRONCHOSCOPY WITH ENDOBRONCHIAL ULTRASOUND N/A 01/30/2020   Procedure: VIDEO BRONCHOSCOPY WITH ENDOBRONCHIAL ULTRASOUND;  Surgeon: Melrose Nakayama, MD;  Location: [facility];  Service: Thoracic;  Laterality: N/A;    REVIEW OF SYSTEMS:  A comprehensive review of systems was negative except for: Constitutional: positive for fatigue Respiratory: positive  for dyspnea on exertion   PHYSICAL EXAMINATION: General appearance: alert, cooperative, fatigued, and no distress Head: Normocephalic, without obvious abnormality, atraumatic Neck: no adenopathy, no JVD, supple, symmetrical, trachea midline, and thyroid not enlarged, symmetric, no tenderness/mass/nodules Lymph nodes: Cervical, supraclavicular, and axillary nodes normal. Resp: clear to auscultation bilaterally Back: symmetric, no curvature. ROM normal. No CVA tenderness. Cardio: regular rate and rhythm, S1, S2 normal, no murmur, click, rub or gallop GI: soft, non-tender; bowel sounds normal; no masses,  no  organomegaly Extremities: extremities normal, atraumatic, no cyanosis or edema  ECOG PERFORMANCE STATUS: 1 - Symptomatic but completely ambulatory  Blood pressure 107/77, pulse 87, temperature (!) 96.2 F (35.7 C), temperature source Tympanic, resp. rate 18, height 5\' 9"  (1.753 m), weight 272 lb 4.8 oz (123.5 kg), SpO2 96 %.  LABORATORY DATA: Lab Results  Component Value Date   WBC 5.7 03/14/2021   HGB 12.5 (L) 03/14/2021   HCT 38.0 (L) 03/14/2021   MCV 81.5 03/14/2021   PLT 176 03/14/2021      Chemistry      Component Value Date/Time   NA 139 03/14/2021 0819   K 4.0 03/14/2021 0819   CL 104 03/14/2021 0819   CO2 26 03/14/2021 0819   BUN 16 03/14/2021 0819   CREATININE 0.86 03/14/2021 0819      Component Value Date/Time   CALCIUM 8.7 (L) 03/14/2021 0819   ALKPHOS 72 03/14/2021 0819   AST 14 (L) 03/14/2021 0819   ALT 10 03/14/2021 0819   BILITOT 0.7 03/14/2021 0819       RADIOGRAPHIC STUDIES: No results found.  ASSESSMENT AND PLAN: This is a very pleasant 75 years old African-American male recently diagnosed with a stage IIIA non-small cell lung cancer, squamous cell carcinoma presented with large right upper lobe lung mass in October 2021. The patient underwent a course of concurrent chemoradiation with weekly carboplatin and paclitaxel status post 6 cycles.  The patient tolerated this course of treatment fairly well except for mild odynophagia. Imaging studies after the induction phase of concurrent chemoradiation showed interval decrease in the size and significant central cavitation of the previously demonstrated right upper lobe lung mass consistent with response to therapy. The patient is currently undergoing consolidation treatment with immunotherapy with Imfinzi 1500 mg IV every 4 weeks status post 11 cycles.   The patient has been tolerating this treatment well with no concerning adverse effects. I recommended for him to proceed with cycle #12 today as  planned. I will see him back for follow-up visit in 4 weeks for evaluation before the last cycle of his treatment. He was advised to call immediately if he has any other concerning symptoms in the interval. The patient voices understanding of current disease status and treatment options and is in agreement with the current care plan.  All questions were answered. The patient knows to call the clinic with any problems, questions or concerns. We can certainly see the patient much sooner if necessary.   Disclaimer: This note was dictated with voice recognition software. Similar sounding words can inadvertently be transcribed and may not be corrected upon review.

## 2021-04-10 ENCOUNTER — Other Ambulatory Visit: Payer: Self-pay

## 2021-04-10 DIAGNOSIS — C3411 Malignant neoplasm of upper lobe, right bronchus or lung: Secondary | ICD-10-CM

## 2021-04-11 ENCOUNTER — Encounter: Payer: Self-pay | Admitting: Internal Medicine

## 2021-04-11 ENCOUNTER — Inpatient Hospital Stay: Payer: Medicare Other

## 2021-04-11 ENCOUNTER — Inpatient Hospital Stay: Payer: Medicare Other | Attending: Internal Medicine

## 2021-04-11 ENCOUNTER — Inpatient Hospital Stay (HOSPITAL_BASED_OUTPATIENT_CLINIC_OR_DEPARTMENT_OTHER): Payer: Medicare Other | Admitting: Internal Medicine

## 2021-04-11 ENCOUNTER — Other Ambulatory Visit: Payer: Self-pay

## 2021-04-11 VITALS — BP 142/71 | HR 79 | Temp 95.9°F | Resp 20 | Ht 69.0 in | Wt 274.2 lb

## 2021-04-11 DIAGNOSIS — Z5112 Encounter for antineoplastic immunotherapy: Secondary | ICD-10-CM

## 2021-04-11 DIAGNOSIS — C3411 Malignant neoplasm of upper lobe, right bronchus or lung: Secondary | ICD-10-CM | POA: Insufficient documentation

## 2021-04-11 DIAGNOSIS — Z79899 Other long term (current) drug therapy: Secondary | ICD-10-CM | POA: Diagnosis not present

## 2021-04-11 DIAGNOSIS — Z923 Personal history of irradiation: Secondary | ICD-10-CM | POA: Diagnosis not present

## 2021-04-11 DIAGNOSIS — C3490 Malignant neoplasm of unspecified part of unspecified bronchus or lung: Secondary | ICD-10-CM

## 2021-04-11 DIAGNOSIS — R1310 Dysphagia, unspecified: Secondary | ICD-10-CM | POA: Insufficient documentation

## 2021-04-11 DIAGNOSIS — Z95828 Presence of other vascular implants and grafts: Secondary | ICD-10-CM

## 2021-04-11 LAB — CBC WITH DIFFERENTIAL (CANCER CENTER ONLY)
Abs Immature Granulocytes: 0.01 10*3/uL (ref 0.00–0.07)
Basophils Absolute: 0 10*3/uL (ref 0.0–0.1)
Basophils Relative: 1 %
Eosinophils Absolute: 0.1 10*3/uL (ref 0.0–0.5)
Eosinophils Relative: 2 %
HCT: 37.4 % — ABNORMAL LOW (ref 39.0–52.0)
Hemoglobin: 12.1 g/dL — ABNORMAL LOW (ref 13.0–17.0)
Immature Granulocytes: 0 %
Lymphocytes Relative: 19 %
Lymphs Abs: 1 10*3/uL (ref 0.7–4.0)
MCH: 26.8 pg (ref 26.0–34.0)
MCHC: 32.4 g/dL (ref 30.0–36.0)
MCV: 82.7 fL (ref 80.0–100.0)
Monocytes Absolute: 0.6 10*3/uL (ref 0.1–1.0)
Monocytes Relative: 11 %
Neutro Abs: 3.4 10*3/uL (ref 1.7–7.7)
Neutrophils Relative %: 67 %
Platelet Count: 168 10*3/uL (ref 150–400)
RBC: 4.52 MIL/uL (ref 4.22–5.81)
RDW: 15.9 % — ABNORMAL HIGH (ref 11.5–15.5)
WBC Count: 5.1 10*3/uL (ref 4.0–10.5)
nRBC: 0 % (ref 0.0–0.2)

## 2021-04-11 LAB — CMP (CANCER CENTER ONLY)
ALT: 11 U/L (ref 0–44)
AST: 12 U/L — ABNORMAL LOW (ref 15–41)
Albumin: 3.2 g/dL — ABNORMAL LOW (ref 3.5–5.0)
Alkaline Phosphatase: 66 U/L (ref 38–126)
Anion gap: 8 (ref 5–15)
BUN: 15 mg/dL (ref 8–23)
CO2: 27 mmol/L (ref 22–32)
Calcium: 8.6 mg/dL — ABNORMAL LOW (ref 8.9–10.3)
Chloride: 105 mmol/L (ref 98–111)
Creatinine: 0.84 mg/dL (ref 0.61–1.24)
GFR, Estimated: >60 mL/min (ref >60)
Glucose, Bld: 140 mg/dL — ABNORMAL HIGH (ref 70–99)
Potassium: 4.2 mmol/L (ref 3.5–5.1)
Sodium: 140 mmol/L (ref 135–145)
Total Bilirubin: 0.7 mg/dL (ref 0.3–1.2)
Total Protein: 6.6 g/dL (ref 6.5–8.1)

## 2021-04-11 LAB — TSH: TSH: 4.442 u[IU]/mL — ABNORMAL HIGH (ref 0.320–4.118)

## 2021-04-11 MED ORDER — SODIUM CHLORIDE 0.9% FLUSH
10.0000 mL | INTRAVENOUS | Status: DC | PRN
  Administered 2021-04-11: 10 mL

## 2021-04-11 MED ORDER — HEPARIN SOD (PORK) LOCK FLUSH 100 UNIT/ML IV SOLN
500.0000 [IU] | Freq: Once | INTRAVENOUS | Status: AC | PRN
  Administered 2021-04-11: 500 [IU]

## 2021-04-11 MED ORDER — SODIUM CHLORIDE 0.9% FLUSH
10.0000 mL | Freq: Once | INTRAVENOUS | Status: AC
  Administered 2021-04-11: 10 mL

## 2021-04-11 MED ORDER — SODIUM CHLORIDE 0.9 % IV SOLN: Freq: Once | INTRAVENOUS | Status: AC

## 2021-04-11 MED ORDER — SODIUM CHLORIDE 0.9 % IV SOLN
1500.0000 mg | Freq: Once | INTRAVENOUS | Status: AC
  Administered 2021-04-11: 1500 mg via INTRAVENOUS
  Filled 2021-04-11: qty 30

## 2021-04-11 NOTE — Patient Instructions (Signed)
[facility]  Discharge Instructions: Thank you for choosing [facility] to provide your oncology and hematology care.   If you have a lab appointment with the Cancer Center, please go directly to the Cancer Center and check in at the registration area.   Wear comfortable clothing and clothing appropriate for easy access to any Portacath or PICC line.   We strive to give you quality time with your provider. You may need to reschedule your appointment if you arrive late (15 or more minutes).  Arriving late affects you and other patients whose appointments are after yours.  Also, if you miss three or more appointments without notifying the office, you may be dismissed from the clinic at the provider's discretion.      For prescription refill requests, have your pharmacy contact our office and allow 72 hours for refills to be completed.    Today you received the following chemotherapy and/or immunotherapy agents imfinzi       To help prevent nausea and vomiting after your treatment, we encourage you to take your nausea medication as directed.  BELOW ARE SYMPTOMS THAT SHOULD BE REPORTED IMMEDIATELY: *FEVER GREATER THAN 100.4 F (38 C) OR HIGHER *CHILLS OR SWEATING *NAUSEA AND VOMITING THAT IS NOT CONTROLLED WITH YOUR NAUSEA MEDICATION *UNUSUAL SHORTNESS OF BREATH *UNUSUAL BRUISING OR BLEEDING *URINARY PROBLEMS (pain or burning when urinating, or frequent urination) *BOWEL PROBLEMS (unusual diarrhea, constipation, pain near the anus) TENDERNESS IN MOUTH AND THROAT WITH OR WITHOUT PRESENCE OF ULCERS (sore throat, sores in mouth, or a toothache) UNUSUAL RASH, SWELLING OR PAIN  UNUSUAL VAGINAL DISCHARGE OR ITCHING   Items with * indicate a potential emergency and should be followed up as soon as possible or go to the Emergency Department if any problems should occur.  Please show the CHEMOTHERAPY ALERT CARD or IMMUNOTHERAPY ALERT CARD at check-in to  the Emergency Department and triage nurse.  Should you have questions after your visit or need to cancel or reschedule your appointment, please contact [facility]  Dept: [phone]  and follow the prompts.  Office hours are 8:00 a.m. to 4:30 p.m. Monday - Friday. Please note that voicemails left after 4:00 p.m. may not be returned until the following business day.  We are closed weekends and major holidays. You have access to a nurse at all times for urgent questions. Please call the main number to the clinic Dept: [phone] and follow the prompts.   For any non-urgent questions, you may also contact your provider using MyChart. We now offer e-Visits for anyone 18 and older to request care online for non-urgent symptoms. For details visit [email or web address].   Also download the MyChart app! Go to the app store, search "MyChart", open the app, select [facility], and log in with your MyChart username and password.  Due to Covid, a mask is required upon entering the hospital/clinic. If you do not have a mask, one will be given to you upon arrival. For doctor visits, patients may have 1 support person aged 18 or older with them. For treatment visits, patients cannot have anyone with them due to current Covid guidelines and our immunocompromised population.   

## 2021-04-11 NOTE — Progress Notes (Signed)
[facility] Telephone:(336) [phone]   Fax:(336) [phone]  OFFICE PROGRESS NOTE  [name], MD [place] [address]  DIAGNOSIS: Stage IIIA (T4, N0, M0) non-small cell lung cancer, squamous cell carcinoma presented with large right upper lobe lung mass diagnosed in October 2021.   PRIOR THERAPY: Concurrent chemoradiation with weekly carboplatin for AUC of 2 and paclitaxel 45 mg/M2. Status post 6 cycles. First dose on 02/27/20.  Last dose was given April 02, 2020.   CURRENT THERAPY:  Consolidation treatment with immunotherapy with Imfinzi 1500 mg IV every 4 weeks.  First dose May 10, 2020.  Status post 12 cycles.  INTERVAL HISTORY: Billy Hall 75 y.o. male returns to the clinic today for follow-up visit.  The patient is feeling fine today with no concerning complaints.  He denied having any current chest pain, shortness of breath, cough or hemoptysis.  He denied having any fever or chills.  He has no nausea, vomiting, diarrhea or constipation.  He has no headache or visual changes.  He has no recent weight loss or night sweats.  He has been tolerating his consolidation treatment with Imfinzi fairly well.  The patient is here today for evaluation before the last cycle of his treatment.  MEDICAL HISTORY: Past Medical History:  Diagnosis Date   Arthritis    Depressive disorder    Diabetes mellitus without complication (HCC)    Fatigue    Headache(784.0)    Hyperlipidemia    Hypertension    Hypothyroidism    Snoring     ALLERGIES:  is allergic to ace inhibitors.  MEDICATIONS:  Current Outpatient Medications  Medication Sig Dispense Refill   acetaminophen (TYLENOL) 650 MG CR tablet Take 650 mg by mouth every 8 (eight) hours as needed for pain.     aspirin EC 81 MG tablet Take 81 mg by mouth daily.     cholecalciferol (VITAMIN D3) 25 MCG (1000 UNIT) tablet Take 1,000 Units by mouth daily.     levothyroxine (SYNTHROID) 88 MCG  tablet Take 88 mcg by mouth daily before breakfast.      losartan (COZAAR) 50 MG tablet Take 50 mg by mouth daily.      metFORMIN (GLUCOPHAGE) 500 MG tablet Take 500 mg by mouth 2 (two) times daily with a meal.      ONETOUCH ULTRA test strip 1 each 2 (two) times daily.     simvastatin (ZOCOR) 10 MG tablet Take 10 mg by mouth every evening.      tamsulosin (FLOMAX) 0.4 MG CAPS capsule Take 0.4 mg by mouth daily.     sildenafil (REVATIO) 20 MG tablet Take 20 mg by mouth daily as needed (ED).      No current facility-administered medications for this visit.    SURGICAL HISTORY:  Past Surgical History:  Procedure Laterality Date   COLONOSCOPY N/A 03/08/2013   Procedure: COLONOSCOPY;  Surgeon: Jamesetta So, MD;  Location: [facility];  Service: Gastroenterology;  Laterality: N/A;   IR IMAGING GUIDED PORT INSERTION  03/08/2020   IR US GUIDE BX ASP/DRAIN  03/08/2020   KNEE ARTHROSCOPY Right    2006 By Dr. Luna Glasgow at [facility].    THYROID SURGERY     VIDEO BRONCHOSCOPY WITH ENDOBRONCHIAL ULTRASOUND N/A 01/30/2020   Procedure: VIDEO BRONCHOSCOPY WITH ENDOBRONCHIAL ULTRASOUND;  Surgeon: Melrose Nakayama, MD;  Location: [facility] OR;  Service: Thoracic;  Laterality: N/A;    REVIEW OF SYSTEMS:  A comprehensive review of  systems was negative.   PHYSICAL EXAMINATION: General appearance: alert, cooperative, fatigued, and no distress Head: Normocephalic, without obvious abnormality, atraumatic Neck: no adenopathy, no JVD, supple, symmetrical, trachea midline, and thyroid not enlarged, symmetric, no tenderness/mass/nodules Lymph nodes: Cervical, supraclavicular, and axillary nodes normal. Resp: clear to auscultation bilaterally Back: symmetric, no curvature. ROM normal. No CVA tenderness. Cardio: regular rate and rhythm, S1, S2 normal, no murmur, click, rub or gallop GI: soft, non-tender; bowel sounds normal; no masses,  no organomegaly Extremities: extremities normal, atraumatic, no cyanosis or  edema  ECOG PERFORMANCE STATUS: 1 - Symptomatic but completely ambulatory  Blood pressure (!) 142/71, pulse 79, temperature (!) 95.9 F (35.5 C), temperature source Tympanic, resp. rate 20, height 5\' 9"  (1.753 m), weight 274 lb 3.2 oz (124.4 kg), SpO2 95 %.  LABORATORY DATA: Lab Results  Component Value Date   WBC 5.1 04/11/2021   HGB 12.1 (L) 04/11/2021   HCT 37.4 (L) 04/11/2021   MCV 82.7 04/11/2021   PLT 168 04/11/2021      Chemistry      Component Value Date/Time   NA 140 04/11/2021 0853   K 4.2 04/11/2021 0853   CL 105 04/11/2021 0853   CO2 27 04/11/2021 0853   BUN 15 04/11/2021 0853   CREATININE 0.84 04/11/2021 0853      Component Value Date/Time   CALCIUM 8.6 (L) 04/11/2021 0853   ALKPHOS 66 04/11/2021 0853   AST 12 (L) 04/11/2021 0853   ALT 11 04/11/2021 0853   BILITOT 0.7 04/11/2021 0853       RADIOGRAPHIC STUDIES: No results found.  ASSESSMENT AND PLAN: This is a very pleasant 75 years old African-American male recently diagnosed with a stage IIIA non-small cell lung cancer, squamous cell carcinoma presented with large right upper lobe lung mass in October 2021. The patient underwent a course of concurrent chemoradiation with weekly carboplatin and paclitaxel status post 6 cycles.  The patient tolerated this course of treatment fairly well except for mild odynophagia. Imaging studies after the induction phase of concurrent chemoradiation showed interval decrease in the size and significant central cavitation of the previously demonstrated right upper lobe lung mass consistent with response to therapy. The patient is currently undergoing consolidation treatment with immunotherapy with Imfinzi 1500 mg IV every 4 weeks status post 12 cycles.   The patient continues to tolerate his treatment fairly well with no concerning adverse effects. I recommended for him to proceed with cycle #13 today as planned. I will see him back for follow-up visit in 1 months for  evaluation with repeat CT scan of the chest for restaging of his disease. He was advised to call immediately if he has any other concerning symptoms in the interval. The patient voices understanding of current disease status and treatment options and is in agreement with the current care plan.  All questions were answered. The patient knows to call the clinic with any problems, questions or concerns. We can certainly see the patient much sooner if necessary.   Disclaimer: This note was dictated with voice recognition software. Similar sounding words can inadvertently be transcribed and may not be corrected upon review.

## 2021-04-18 ENCOUNTER — Telehealth: Payer: Self-pay | Admitting: Internal Medicine

## 2021-04-18 NOTE — Telephone Encounter (Signed)
Sch per 12/15 los, pt friend Kathlene Cote aware and will share appts.

## 2021-05-03 DIAGNOSIS — E559 Vitamin D deficiency, unspecified: Secondary | ICD-10-CM | POA: Diagnosis not present

## 2021-05-03 DIAGNOSIS — E1165 Type 2 diabetes mellitus with hyperglycemia: Secondary | ICD-10-CM | POA: Diagnosis not present

## 2021-05-03 DIAGNOSIS — I10 Essential (primary) hypertension: Secondary | ICD-10-CM | POA: Diagnosis not present

## 2021-05-03 DIAGNOSIS — Z0000 Encounter for general adult medical examination without abnormal findings: Secondary | ICD-10-CM | POA: Diagnosis not present

## 2021-05-03 DIAGNOSIS — E782 Mixed hyperlipidemia: Secondary | ICD-10-CM | POA: Diagnosis not present

## 2021-05-03 DIAGNOSIS — E039 Hypothyroidism, unspecified: Secondary | ICD-10-CM | POA: Diagnosis not present

## 2021-05-03 DIAGNOSIS — K648 Other hemorrhoids: Secondary | ICD-10-CM | POA: Diagnosis not present

## 2021-05-10 ENCOUNTER — Inpatient Hospital Stay: Payer: Medicare Other | Attending: Internal Medicine

## 2021-05-10 ENCOUNTER — Ambulatory Visit (HOSPITAL_COMMUNITY)
Admission: RE | Admit: 2021-05-10 | Discharge: 2021-05-10 | Disposition: A | Discharge status: Home / Self Care | Payer: Medicare Other | Source: Ambulatory Visit | Attending: Internal Medicine | Admitting: Internal Medicine

## 2021-05-10 ENCOUNTER — Other Ambulatory Visit: Payer: Self-pay

## 2021-05-10 ENCOUNTER — Inpatient Hospital Stay: Payer: Medicare Other

## 2021-05-10 DIAGNOSIS — C3411 Malignant neoplasm of upper lobe, right bronchus or lung: Secondary | ICD-10-CM | POA: Diagnosis not present

## 2021-05-10 DIAGNOSIS — Z5112 Encounter for antineoplastic immunotherapy: Secondary | ICD-10-CM

## 2021-05-10 DIAGNOSIS — Z79899 Other long term (current) drug therapy: Secondary | ICD-10-CM | POA: Diagnosis not present

## 2021-05-10 DIAGNOSIS — R0609 Other forms of dyspnea: Secondary | ICD-10-CM | POA: Insufficient documentation

## 2021-05-10 DIAGNOSIS — Z923 Personal history of irradiation: Secondary | ICD-10-CM | POA: Diagnosis not present

## 2021-05-10 DIAGNOSIS — C3490 Malignant neoplasm of unspecified part of unspecified bronchus or lung: Secondary | ICD-10-CM

## 2021-05-10 DIAGNOSIS — R0602 Shortness of breath: Secondary | ICD-10-CM | POA: Insufficient documentation

## 2021-05-10 DIAGNOSIS — R1310 Dysphagia, unspecified: Secondary | ICD-10-CM | POA: Diagnosis not present

## 2021-05-10 DIAGNOSIS — Z95828 Presence of other vascular implants and grafts: Secondary | ICD-10-CM

## 2021-05-10 DIAGNOSIS — I700 Atherosclerosis of aorta: Secondary | ICD-10-CM | POA: Diagnosis not present

## 2021-05-10 DIAGNOSIS — J90 Pleural effusion, not elsewhere classified: Secondary | ICD-10-CM | POA: Diagnosis not present

## 2021-05-10 LAB — CMP (CANCER CENTER ONLY)
ALT: 13 U/L (ref 0–44)
AST: 14 U/L — ABNORMAL LOW (ref 15–41)
Albumin: 3.7 g/dL (ref 3.5–5.0)
Alkaline Phosphatase: 70 U/L (ref 38–126)
Anion gap: 7 (ref 5–15)
BUN: 14 mg/dL (ref 8–23)
CO2: 27 mmol/L (ref 22–32)
Calcium: 8.9 mg/dL (ref 8.9–10.3)
Chloride: 105 mmol/L (ref 98–111)
Creatinine: 0.79 mg/dL (ref 0.61–1.24)
GFR, Estimated: >60 mL/min (ref >60)
Glucose, Bld: 84 mg/dL (ref 70–99)
Potassium: 4.2 mmol/L (ref 3.5–5.1)
Sodium: 139 mmol/L (ref 135–145)
Total Bilirubin: 0.5 mg/dL (ref 0.3–1.2)
Total Protein: 6.8 g/dL (ref 6.5–8.1)

## 2021-05-10 LAB — CBC WITH DIFFERENTIAL (CANCER CENTER ONLY)
Abs Immature Granulocytes: 0.01 10*3/uL (ref 0.00–0.07)
Basophils Absolute: 0 10*3/uL (ref 0.0–0.1)
Basophils Relative: 1 %
Eosinophils Absolute: 0.1 10*3/uL (ref 0.0–0.5)
Eosinophils Relative: 2 %
HCT: 38.2 % — ABNORMAL LOW (ref 39.0–52.0)
Hemoglobin: 12.3 g/dL — ABNORMAL LOW (ref 13.0–17.0)
Immature Granulocytes: 0 %
Lymphocytes Relative: 22 %
Lymphs Abs: 1.3 10*3/uL (ref 0.7–4.0)
MCH: 26.7 pg (ref 26.0–34.0)
MCHC: 32.2 g/dL (ref 30.0–36.0)
MCV: 83 fL (ref 80.0–100.0)
Monocytes Absolute: 0.8 10*3/uL (ref 0.1–1.0)
Monocytes Relative: 13 %
Neutro Abs: 3.7 10*3/uL (ref 1.7–7.7)
Neutrophils Relative %: 62 %
Platelet Count: 183 10*3/uL (ref 150–400)
RBC: 4.6 MIL/uL (ref 4.22–5.81)
RDW: 16.3 % — ABNORMAL HIGH (ref 11.5–15.5)
WBC Count: 6 10*3/uL (ref 4.0–10.5)
nRBC: 0 % (ref 0.0–0.2)

## 2021-05-10 LAB — TSH: TSH: 2.269 u[IU]/mL (ref 0.320–4.118)

## 2021-05-10 MED ORDER — IOHEXOL 300 MG/ML  SOLN
75.0000 mL | Freq: Once | INTRAMUSCULAR | Status: AC | PRN
  Administered 2021-05-10: 75 mL via INTRAVENOUS

## 2021-05-10 MED ORDER — SODIUM CHLORIDE 0.9% FLUSH
10.0000 mL | Freq: Once | INTRAVENOUS | Status: AC
  Administered 2021-05-10: 10 mL

## 2021-05-10 MED ORDER — HEPARIN SOD (PORK) LOCK FLUSH 100 UNIT/ML IV SOLN
500.0000 [IU] | Freq: Once | INTRAVENOUS | Status: AC
  Administered 2021-05-10: 500 [IU] via INTRAVENOUS

## 2021-05-10 MED ORDER — SODIUM CHLORIDE (PF) 0.9 % IJ SOLN
INTRAMUSCULAR | Status: AC
  Filled 2021-05-10: qty 50

## 2021-05-13 ENCOUNTER — Other Ambulatory Visit: Payer: Medicare Other

## 2021-05-16 ENCOUNTER — Inpatient Hospital Stay: Payer: Medicare Other | Admitting: Internal Medicine

## 2021-05-16 ENCOUNTER — Other Ambulatory Visit: Payer: Self-pay

## 2021-05-16 VITALS — BP 148/76 | HR 90 | Temp 96.4°F | Resp 20 | Ht 69.0 in | Wt 275.6 lb

## 2021-05-16 DIAGNOSIS — R0609 Other forms of dyspnea: Secondary | ICD-10-CM | POA: Diagnosis not present

## 2021-05-16 DIAGNOSIS — C3411 Malignant neoplasm of upper lobe, right bronchus or lung: Secondary | ICD-10-CM | POA: Diagnosis not present

## 2021-05-16 DIAGNOSIS — Z923 Personal history of irradiation: Secondary | ICD-10-CM | POA: Diagnosis not present

## 2021-05-16 DIAGNOSIS — R1310 Dysphagia, unspecified: Secondary | ICD-10-CM | POA: Diagnosis not present

## 2021-05-16 DIAGNOSIS — R0602 Shortness of breath: Secondary | ICD-10-CM | POA: Diagnosis not present

## 2021-05-16 DIAGNOSIS — C3490 Malignant neoplasm of unspecified part of unspecified bronchus or lung: Secondary | ICD-10-CM | POA: Diagnosis not present

## 2021-05-16 DIAGNOSIS — Z79899 Other long term (current) drug therapy: Secondary | ICD-10-CM | POA: Diagnosis not present

## 2021-05-16 DIAGNOSIS — I700 Atherosclerosis of aorta: Secondary | ICD-10-CM | POA: Diagnosis not present

## 2021-05-16 NOTE — Progress Notes (Signed)
[place] Telephone:(336) [phone]   Fax:(336) [phone]  OFFICE PROGRESS NOTE  [name], MD [place] [address]  DIAGNOSIS: Stage IIIA (T4, N0, M0) non-small cell lung cancer, squamous cell carcinoma presented with large right upper lobe lung mass diagnosed in October 2021.   PRIOR THERAPY:  1) Concurrent chemoradiation with weekly carboplatin for AUC of 2 and paclitaxel 45 mg/M2. Status post 6 cycles. First dose on 02/27/20.  Last dose was given April 02, 2020. 2) Consolidation treatment with immunotherapy with Imfinzi 1500 mg IV every 4 weeks.  First dose May 10, 2020.  Status post 13 cycles.  CURRENT THERAPY: Observation  INTERVAL HISTORY: Billy Hall 76 y.o. male returns to the clinic today for follow-up visit.  The patient is feeling fine today with no concerning complaints except for shortness of breath with exertion.  He denied having any current chest pain, cough or hemoptysis.  He denied having any fever or chills.  He has no nausea, vomiting, diarrhea or constipation.  He has no headache or visual changes.  He denied having any significant weight loss or night sweats.  He tolerated the previous course of consolidation treatment with immunotherapy fairly well.  The patient had repeat CT scan of the chest and he is here for evaluation and discussion of his discuss results.  MEDICAL HISTORY: Past Medical History:  Diagnosis Date   Arthritis    Depressive disorder    Diabetes mellitus without complication (HCC)    Fatigue    Headache(784.0)    Hyperlipidemia    Hypertension    Hypothyroidism    Snoring     ALLERGIES:  is allergic to ace inhibitors.  MEDICATIONS:  Current Outpatient Medications  Medication Sig Dispense Refill   acetaminophen (TYLENOL) 650 MG CR tablet Take 650 mg by mouth every 8 (eight) hours as needed for pain.     aspirin EC 81 MG tablet Take 81 mg by mouth daily.     cholecalciferol  (VITAMIN D3) 25 MCG (1000 UNIT) tablet Take 1,000 Units by mouth daily.     levothyroxine (SYNTHROID) 100 MCG tablet Take 100 mcg by mouth daily.     losartan (COZAAR) 50 MG tablet Take 50 mg by mouth daily.      metFORMIN (GLUCOPHAGE) 500 MG tablet Take 500 mg by mouth 2 (two) times daily with a meal.      ONETOUCH ULTRA test strip 1 each 2 (two) times daily.     sildenafil (REVATIO) 20 MG tablet Take 20 mg by mouth daily as needed (ED).      simvastatin (ZOCOR) 10 MG tablet Take 10 mg by mouth every evening.      tamsulosin (FLOMAX) 0.4 MG CAPS capsule Take 0.4 mg by mouth daily.     No current facility-administered medications for this visit.    SURGICAL HISTORY:  Past Surgical History:  Procedure Laterality Date   COLONOSCOPY N/A 03/08/2013   Procedure: COLONOSCOPY;  Surgeon: Jamesetta So, MD;  [facility];  Service: Gastroenterology;  Laterality: N/A;   IR IMAGING GUIDED PORT INSERTION  03/08/2020   IR US GUIDE BX ASP/DRAIN  03/08/2020   KNEE ARTHROSCOPY Right    2006 By Dr. Luna Glasgow at [facility].    THYROID SURGERY     VIDEO BRONCHOSCOPY WITH ENDOBRONCHIAL ULTRASOUND N/A 01/30/2020   Procedure: VIDEO BRONCHOSCOPY WITH ENDOBRONCHIAL ULTRASOUND;  Surgeon: Melrose Nakayama, MD;  Location: [place];  Service: Thoracic;  Laterality:  N/A;    REVIEW OF SYSTEMS:  Constitutional: negative Eyes: negative Ears, nose, mouth, throat, and face: negative Respiratory: positive for dyspnea on exertion Cardiovascular: negative Gastrointestinal: negative Genitourinary:negative Integument/breast: negative Hematologic/lymphatic: negative Musculoskeletal:negative Neurological: negative Behavioral/Psych: negative Endocrine: negative Allergic/Immunologic: negative   PHYSICAL EXAMINATION: General appearance: alert, cooperative, fatigued, and no distress Head: Normocephalic, without obvious abnormality, atraumatic Neck: no adenopathy, no JVD, supple, symmetrical, trachea midline, and  thyroid not enlarged, symmetric, no tenderness/mass/nodules Lymph nodes: Cervical, supraclavicular, and axillary nodes normal. Resp: clear to auscultation bilaterally Back: symmetric, no curvature. ROM normal. No CVA tenderness. Cardio: regular rate and rhythm, S1, S2 normal, no murmur, click, rub or gallop GI: soft, non-tender; bowel sounds normal; no masses,  no organomegaly Extremities: extremities normal, atraumatic, no cyanosis or edema Neurologic: Alert and oriented X 3, normal strength and tone. Normal symmetric reflexes. Normal coordination and gait  ECOG PERFORMANCE STATUS: 1 - Symptomatic but completely ambulatory  Blood pressure (!) 148/76, pulse 90, temperature (!) 96.4 F (35.8 C), temperature source Tympanic, resp. rate 20, height 5\' 9"  (1.753 m), weight 275 lb 9.6 oz (125 kg), SpO2 92 %.  LABORATORY DATA: Lab Results  Component Value Date   WBC 6.0 05/10/2021   HGB 12.3 (L) 05/10/2021   HCT 38.2 (L) 05/10/2021   MCV 83.0 05/10/2021   PLT 183 05/10/2021      Chemistry      Component Value Date/Time   NA 139 05/10/2021 1208   K 4.2 05/10/2021 1208   CL 105 05/10/2021 1208   CO2 27 05/10/2021 1208   BUN 14 05/10/2021 1208   CREATININE 0.79 05/10/2021 1208      Component Value Date/Time   CALCIUM 8.9 05/10/2021 1208   ALKPHOS 70 05/10/2021 1208   AST 14 (L) 05/10/2021 1208   ALT 13 05/10/2021 1208   BILITOT 0.5 05/10/2021 1208       RADIOGRAPHIC STUDIES: CT Chest W Contrast  Result Date: 05/12/2021 CLINICAL DATA:  76 year old male with history of non-small cell lung cancer. Follow-up study. EXAM: CT CHEST WITH CONTRAST TECHNIQUE: Multidetector CT imaging of the chest was performed during intravenous contrast administration. RADIATION DOSE REDUCTION: This exam was performed according to the departmental dose-optimization program which includes automated exposure control, adjustment of the mA and/or kV according to patient size and/or use of iterative  reconstruction technique. CONTRAST:  57mL OMNIPAQUE IOHEXOL 300 MG/ML  SOLN COMPARISON:  Chest CT 01/15/2021. FINDINGS: Cardiovascular: Heart size is normal. There is no significant pericardial fluid, thickening or pericardial calcification. Aortic atherosclerosis. No definite coronary artery calcifications. Dilatation of the pulmonic trunk (4.1 cm in diameter), concerning for pulmonary arterial hypertension. Right internal jugular single-lumen porta cath with tip terminating at the superior cavoatrial junction. Mediastinum/Nodes: No pathologically enlarged mediastinal or hilar lymph nodes. Esophagus is unremarkable in appearance. No axillary lymphadenopathy. Lungs/Pleura: Chronic architectural distortion and volume loss in the upper right lung, including a chronic thick-walled cavitary region in the right upper lobe near the apex, grossly similar compared to prior examinations. No definite new suspicious appearing pulmonary nodules or masses are noted. No acute consolidative airspace disease. No pleural effusions. Upper Abdomen: Aortic atherosclerosis. Small calcified granulomas are noted in the right lobe of the liver. Musculoskeletal: There are no aggressive appearing lytic or blastic lesions noted in the visualized portions of the skeleton. IMPRESSION: 1. Stable areas of chronic postradiation mass-like fibrosis, including a thick-walled cavitary area in the apex of the right upper lobe, similar to prior examinations, as above. No definite findings  to suggest locally recurrent disease or metastatic disease in the thorax. 2. Previously noted trace right pleural effusion has resolved. 3. Dilatation of the pulmonic trunk (4.1 cm in diameter) suggestive of pulmonary arterial hypertension. 4. Aortic atherosclerosis. Aortic Atherosclerosis (ICD10-I70.0). Electronically Signed   By: Vinnie Langton M.D.   On: 05/12/2021 08:04    ASSESSMENT AND PLAN: This is a very pleasant 76 years old African-American male recently  diagnosed with a stage IIIA non-small cell lung cancer, squamous cell carcinoma presented with large right upper lobe lung mass in October 2021. The patient underwent a course of concurrent chemoradiation with weekly carboplatin and paclitaxel status post 6 cycles.  The patient tolerated this course of treatment fairly well except for mild odynophagia. Imaging studies after the induction phase of concurrent chemoradiation showed interval decrease in the size and significant central cavitation of the previously demonstrated right upper lobe lung mass consistent with response to therapy. The patient underwent a course of consolidation treatment with immunotherapy with Imfinzi 1500 mg IV every 4 weeks status post 13 cycles.   He tolerated his consolidation treatment with immunotherapy fairly well. He had repeat CT scan of the chest performed recently.  I personally and independently reviewed the scan and discussed the results with the patient today. His scan showed no concerning findings for disease progression. I recommended for the patient to continue on observation with repeat CT scan of the chest in 3 months. He was advised to call immediately if he has any other concerning symptoms in the interval. The patient voices understanding of current disease status and treatment options and is in agreement with the current care plan.  All questions were answered. The patient knows to call the clinic with any problems, questions or concerns. We can certainly see the patient much sooner if necessary.   Disclaimer: This note was dictated with voice recognition software. Similar sounding words can inadvertently be transcribed and may not be corrected upon review.

## 2021-05-22 ENCOUNTER — Encounter: Payer: Self-pay | Admitting: Gastroenterology

## 2021-05-28 DIAGNOSIS — I10 Essential (primary) hypertension: Secondary | ICD-10-CM | POA: Diagnosis not present

## 2021-05-28 DIAGNOSIS — E1165 Type 2 diabetes mellitus with hyperglycemia: Secondary | ICD-10-CM | POA: Diagnosis not present

## 2021-06-14 DIAGNOSIS — E039 Hypothyroidism, unspecified: Secondary | ICD-10-CM | POA: Diagnosis not present

## 2021-06-14 DIAGNOSIS — E1165 Type 2 diabetes mellitus with hyperglycemia: Secondary | ICD-10-CM | POA: Diagnosis not present

## 2021-06-24 DIAGNOSIS — H109 Unspecified conjunctivitis: Secondary | ICD-10-CM | POA: Diagnosis not present

## 2021-06-25 DIAGNOSIS — S0502XA Injury of conjunctiva and corneal abrasion without foreign body, left eye, initial encounter: Secondary | ICD-10-CM | POA: Diagnosis not present

## 2021-06-27 DIAGNOSIS — S0502XD Injury of conjunctiva and corneal abrasion without foreign body, left eye, subsequent encounter: Secondary | ICD-10-CM | POA: Diagnosis not present

## 2021-07-11 ENCOUNTER — Telehealth: Payer: Self-pay

## 2021-07-11 NOTE — Telephone Encounter (Signed)
Pt left a message wanting to know when his follow-up appt with Dr. Julien Nordmann is. I called pt back at the number he left and there was no answer. I then attempted to call pt on his cell number and there was no answer and the VM is not set up. ?

## 2021-08-05 DIAGNOSIS — I10 Essential (primary) hypertension: Secondary | ICD-10-CM | POA: Diagnosis not present

## 2021-08-05 DIAGNOSIS — E782 Mixed hyperlipidemia: Secondary | ICD-10-CM | POA: Diagnosis not present

## 2021-08-05 DIAGNOSIS — R7989 Other specified abnormal findings of blood chemistry: Secondary | ICD-10-CM | POA: Diagnosis not present

## 2021-08-05 DIAGNOSIS — E118 Type 2 diabetes mellitus with unspecified complications: Secondary | ICD-10-CM | POA: Diagnosis not present

## 2021-08-05 DIAGNOSIS — E7849 Other hyperlipidemia: Secondary | ICD-10-CM | POA: Diagnosis not present

## 2021-08-05 DIAGNOSIS — E559 Vitamin D deficiency, unspecified: Secondary | ICD-10-CM | POA: Diagnosis not present

## 2021-08-07 ENCOUNTER — Telehealth: Payer: Self-pay | Admitting: Internal Medicine

## 2021-08-07 NOTE — Telephone Encounter (Signed)
Called patient regarding upcoming appointment, patient is notified. °

## 2021-08-07 NOTE — Telephone Encounter (Signed)
.  Called patient to schedule appointment per 4/12 inbasket, patient is aware of date and time.   ?

## 2021-08-09 ENCOUNTER — Inpatient Hospital Stay: Payer: Medicare Other | Attending: Internal Medicine

## 2021-08-09 ENCOUNTER — Ambulatory Visit (HOSPITAL_COMMUNITY)
Admission: RE | Admit: 2021-08-09 | Discharge: 2021-08-09 | Disposition: A | Discharge status: Home / Self Care | Payer: Medicare Other | Source: Ambulatory Visit | Attending: Internal Medicine | Admitting: Internal Medicine

## 2021-08-09 ENCOUNTER — Other Ambulatory Visit: Payer: Self-pay

## 2021-08-09 DIAGNOSIS — J90 Pleural effusion, not elsewhere classified: Secondary | ICD-10-CM | POA: Diagnosis not present

## 2021-08-09 DIAGNOSIS — R062 Wheezing: Secondary | ICD-10-CM | POA: Insufficient documentation

## 2021-08-09 DIAGNOSIS — Z923 Personal history of irradiation: Secondary | ICD-10-CM | POA: Insufficient documentation

## 2021-08-09 DIAGNOSIS — C3490 Malignant neoplasm of unspecified part of unspecified bronchus or lung: Secondary | ICD-10-CM | POA: Insufficient documentation

## 2021-08-09 DIAGNOSIS — I2510 Atherosclerotic heart disease of native coronary artery without angina pectoris: Secondary | ICD-10-CM | POA: Insufficient documentation

## 2021-08-09 DIAGNOSIS — C3411 Malignant neoplasm of upper lobe, right bronchus or lung: Secondary | ICD-10-CM | POA: Insufficient documentation

## 2021-08-09 DIAGNOSIS — R0609 Other forms of dyspnea: Secondary | ICD-10-CM | POA: Insufficient documentation

## 2021-08-09 DIAGNOSIS — I700 Atherosclerosis of aorta: Secondary | ICD-10-CM | POA: Diagnosis not present

## 2021-08-09 DIAGNOSIS — Z9221 Personal history of antineoplastic chemotherapy: Secondary | ICD-10-CM | POA: Insufficient documentation

## 2021-08-09 DIAGNOSIS — R1310 Dysphagia, unspecified: Secondary | ICD-10-CM | POA: Insufficient documentation

## 2021-08-09 DIAGNOSIS — K5730 Diverticulosis of large intestine without perforation or abscess without bleeding: Secondary | ICD-10-CM | POA: Insufficient documentation

## 2021-08-09 DIAGNOSIS — R0602 Shortness of breath: Secondary | ICD-10-CM | POA: Insufficient documentation

## 2021-08-09 DIAGNOSIS — Z79899 Other long term (current) drug therapy: Secondary | ICD-10-CM | POA: Insufficient documentation

## 2021-08-09 LAB — CBC WITH DIFFERENTIAL (CANCER CENTER ONLY)
Abs Immature Granulocytes: 0.01 10*3/uL (ref 0.00–0.07)
Basophils Absolute: 0 10*3/uL (ref 0.0–0.1)
Basophils Relative: 1 %
Eosinophils Absolute: 0.1 10*3/uL (ref 0.0–0.5)
Eosinophils Relative: 1 %
HCT: 41.7 % (ref 39.0–52.0)
Hemoglobin: 13.5 g/dL (ref 13.0–17.0)
Immature Granulocytes: 0 %
Lymphocytes Relative: 21 %
Lymphs Abs: 1.3 10*3/uL (ref 0.7–4.0)
MCH: 26.6 pg (ref 26.0–34.0)
MCHC: 32.4 g/dL (ref 30.0–36.0)
MCV: 82.1 fL (ref 80.0–100.0)
Monocytes Absolute: 0.8 10*3/uL (ref 0.1–1.0)
Monocytes Relative: 13 %
Neutro Abs: 3.8 10*3/uL (ref 1.7–7.7)
Neutrophils Relative %: 64 %
Platelet Count: 204 10*3/uL (ref 150–400)
RBC: 5.08 MIL/uL (ref 4.22–5.81)
RDW: 15.8 % — ABNORMAL HIGH (ref 11.5–15.5)
WBC Count: 5.9 10*3/uL (ref 4.0–10.5)
nRBC: 0 % (ref 0.0–0.2)

## 2021-08-09 LAB — CMP (CANCER CENTER ONLY)
ALT: 10 U/L (ref 0–44)
AST: 11 U/L — ABNORMAL LOW (ref 15–41)
Albumin: 3.8 g/dL (ref 3.5–5.0)
Alkaline Phosphatase: 60 U/L (ref 38–126)
Anion gap: 5 (ref 5–15)
BUN: 15 mg/dL (ref 8–23)
CO2: 28 mmol/L (ref 22–32)
Calcium: 9.1 mg/dL (ref 8.9–10.3)
Chloride: 105 mmol/L (ref 98–111)
Creatinine: 0.81 mg/dL (ref 0.61–1.24)
GFR, Estimated: >60 mL/min (ref >60)
Glucose, Bld: 121 mg/dL — ABNORMAL HIGH (ref 70–99)
Potassium: 4.3 mmol/L (ref 3.5–5.1)
Sodium: 138 mmol/L (ref 135–145)
Total Bilirubin: 0.4 mg/dL (ref 0.3–1.2)
Total Protein: 7 g/dL (ref 6.5–8.1)

## 2021-08-09 MED ORDER — SODIUM CHLORIDE (PF) 0.9 % IJ SOLN
INTRAMUSCULAR | Status: AC
  Filled 2021-08-09: qty 50

## 2021-08-09 MED ORDER — IOHEXOL 300 MG/ML  SOLN
75.0000 mL | Freq: Once | INTRAMUSCULAR | Status: AC | PRN
  Administered 2021-08-09: 75 mL via INTRAVENOUS

## 2021-08-14 ENCOUNTER — Inpatient Hospital Stay: Payer: Medicare Other | Admitting: Internal Medicine

## 2021-08-14 ENCOUNTER — Other Ambulatory Visit: Payer: Medicare Other

## 2021-08-14 ENCOUNTER — Other Ambulatory Visit: Payer: Self-pay

## 2021-08-14 VITALS — BP 140/69 | HR 80 | Temp 97.4°F | Resp 19 | Ht 69.0 in | Wt 270.5 lb

## 2021-08-14 DIAGNOSIS — I700 Atherosclerosis of aorta: Secondary | ICD-10-CM | POA: Diagnosis not present

## 2021-08-14 DIAGNOSIS — C3490 Malignant neoplasm of unspecified part of unspecified bronchus or lung: Secondary | ICD-10-CM | POA: Diagnosis not present

## 2021-08-14 DIAGNOSIS — R1310 Dysphagia, unspecified: Secondary | ICD-10-CM | POA: Diagnosis not present

## 2021-08-14 DIAGNOSIS — Z9221 Personal history of antineoplastic chemotherapy: Secondary | ICD-10-CM | POA: Diagnosis not present

## 2021-08-14 DIAGNOSIS — C3411 Malignant neoplasm of upper lobe, right bronchus or lung: Secondary | ICD-10-CM | POA: Diagnosis not present

## 2021-08-14 DIAGNOSIS — Z923 Personal history of irradiation: Secondary | ICD-10-CM | POA: Diagnosis not present

## 2021-08-14 DIAGNOSIS — R0609 Other forms of dyspnea: Secondary | ICD-10-CM | POA: Diagnosis not present

## 2021-08-14 DIAGNOSIS — I2510 Atherosclerotic heart disease of native coronary artery without angina pectoris: Secondary | ICD-10-CM | POA: Diagnosis not present

## 2021-08-14 DIAGNOSIS — J90 Pleural effusion, not elsewhere classified: Secondary | ICD-10-CM | POA: Diagnosis not present

## 2021-08-14 DIAGNOSIS — Z79899 Other long term (current) drug therapy: Secondary | ICD-10-CM | POA: Diagnosis not present

## 2021-08-14 DIAGNOSIS — K5730 Diverticulosis of large intestine without perforation or abscess without bleeding: Secondary | ICD-10-CM | POA: Diagnosis not present

## 2021-08-14 DIAGNOSIS — R062 Wheezing: Secondary | ICD-10-CM | POA: Diagnosis not present

## 2021-08-14 DIAGNOSIS — R0602 Shortness of breath: Secondary | ICD-10-CM | POA: Diagnosis not present

## 2021-08-14 NOTE — Progress Notes (Signed)
?    [facility] ?[phone]   [phone] ? ?OFFICE PROGRESS NOTE ? ?[name], MD ?[address] ?[place] [address] ? ?DIAGNOSIS: Stage IIIA (T4, N0, M0) non-small cell lung cancer, squamous cell carcinoma presented with large right upper lobe lung mass diagnosed in October 2021. ?  ?PRIOR THERAPY:  ?1) Concurrent chemoradiation with weekly carboplatin for AUC of 2 and paclitaxel 45 mg/M2. Status post 6 cycles. First dose on 02/27/20.  Last dose was given April 02, 2020. ?2) Consolidation treatment with immunotherapy with Imfinzi 1500 mg IV every 4 weeks.  First dose May 10, 2020.  Status post 13 cycles. ? ?CURRENT THERAPY: Observation ? ?INTERVAL HISTORY: ?Billy Hall 76 y.o. male returns to the clinic today for 19-month follow-up visit.  The patient is feeling fine today with no concerning complaints except for mild shortness of breath with exertion and wheezing.  He denied having any current chest pain, cough or hemoptysis.  He has no nausea, vomiting, diarrhea or constipation.  He has no headache or visual changes.  He denied having any recent weight loss or night sweats.  He had repeat CT scan of the chest performed recently and he is here for evaluation and discussion of his scan results. ? ?MEDICAL HISTORY: ?Past Medical History:  ?Diagnosis Date  ? Arthritis   ? Depressive disorder   ? Diabetes mellitus without complication ([place])   ? Fatigue   ? Headache(784.0)   ? Hyperlipidemia   ? Hypertension   ? Hypothyroidism   ? Snoring   ? ? ?ALLERGIES:  is allergic to ace inhibitors. ? ?MEDICATIONS:  ?Current Outpatient Medications  ?Medication Sig Dispense Refill  ? acetaminophen (TYLENOL) 650 MG CR tablet Take 650 mg by mouth every 8 (eight) hours as needed for pain.    ? aspirin EC 81 MG tablet Take 81 mg by mouth daily.    ? cholecalciferol (VITAMIN D3) 25 MCG (1000 UNIT) tablet Take 1,000 Units by mouth daily.    ? levothyroxine (SYNTHROID) 100 MCG  tablet Take 100 mcg by mouth daily.    ? losartan (COZAAR) 50 MG tablet Take 50 mg by mouth daily.     ? metFORMIN (GLUCOPHAGE) 500 MG tablet Take 500 mg by mouth 2 (two) times daily with a meal.     ? ONETOUCH ULTRA test strip 1 each 2 (two) times daily.    ? sildenafil (REVATIO) 20 MG tablet Take 20 mg by mouth daily as needed (ED).     ? simvastatin (ZOCOR) 10 MG tablet Take 10 mg by mouth every evening.     ? tamsulosin (FLOMAX) 0.4 MG CAPS capsule Take 0.4 mg by mouth daily.    ? ?No current facility-administered medications for this visit.  ? ? ?SURGICAL HISTORY:  ?Past Surgical History:  ?Procedure Laterality Date  ? COLONOSCOPY N/A 03/08/2013  ? Procedure: COLONOSCOPY;  Surgeon: Jamesetta So, MD;  Location: [facility];  Service: Gastroenterology;  Laterality: N/A;  ? IR IMAGING GUIDED PORT INSERTION  03/08/2020  ? IR US GUIDE BX ASP/DRAIN  03/08/2020  ? KNEE ARTHROSCOPY Right   ? 2006 By Dr. Luna Glasgow at [facility].   ? THYROID SURGERY    ? VIDEO BRONCHOSCOPY WITH ENDOBRONCHIAL ULTRASOUND N/A 01/30/2020  ? Procedure: VIDEO BRONCHOSCOPY WITH ENDOBRONCHIAL ULTRASOUND;  Surgeon: Melrose Nakayama, MD;  Location: [facility] OR;  Service: Thoracic;  Laterality: N/A;  ? ? ?REVIEW OF SYSTEMS:  A comprehensive review of systems was negative except  for: Respiratory: positive for dyspnea on exertion and wheezing  ? ?PHYSICAL EXAMINATION: General appearance: alert, cooperative, and no distress ?Head: Normocephalic, without obvious abnormality, atraumatic ?Neck: no adenopathy, no JVD, supple, symmetrical, trachea midline, and thyroid not enlarged, symmetric, no tenderness/mass/nodules ?Lymph nodes: Cervical, supraclavicular, and axillary nodes normal. ?Resp: wheezes bilaterally ?Back: symmetric, no curvature. ROM normal. No CVA tenderness. ?Cardio: regular rate and rhythm, S1, S2 normal, no murmur, click, rub or gallop ?GI: soft, non-tender; bowel sounds normal; no masses,  no organomegaly ?Extremities: extremities normal,  atraumatic, no cyanosis or edema ? ?ECOG PERFORMANCE STATUS: 1 - Symptomatic but completely ambulatory ? ?Blood pressure 140/69, pulse 80, temperature (!) 97.4 ?F (36.3 ?C), temperature source Tympanic, resp. rate 19, height 5\' 9"  (1.753 m), weight 270 lb 8 oz (122.7 kg), SpO2 98 %. ? ?LABORATORY DATA: ?Lab Results  ?Component Value Date  ? WBC 5.9 08/09/2021  ? HGB 13.5 08/09/2021  ? HCT 41.7 08/09/2021  ? MCV 82.1 08/09/2021  ? PLT 204 08/09/2021  ? ? ?  Chemistry   ?   ?Component Value Date/Time  ? NA 138 08/09/2021 1338  ? K 4.3 08/09/2021 1338  ? CL 105 08/09/2021 1338  ? CO2 28 08/09/2021 1338  ? BUN 15 08/09/2021 1338  ? CREATININE 0.81 08/09/2021 1338  ?    ?Component Value Date/Time  ? CALCIUM 9.1 08/09/2021 1338  ? ALKPHOS 60 08/09/2021 1338  ? AST 11 (L) 08/09/2021 1338  ? ALT 10 08/09/2021 1338  ? BILITOT 0.4 08/09/2021 1338  ?  ? ? ? ?RADIOGRAPHIC STUDIES: ?CT Chest W Contrast ? ?Result Date: 08/11/2021 ?CLINICAL DATA:  76 year old male with history of non-small cell lung cancer diagnosed in December 2022 status post chemotherapy and radiation therapy, now complete. Follow-up study. * Tracking Code: BO * EXAM: CT CHEST WITH CONTRAST TECHNIQUE: Multidetector CT imaging of the chest was performed during intravenous contrast administration. RADIATION DOSE REDUCTION: This exam was performed according to the departmental dose-optimization program which includes automated exposure control, adjustment of the mA and/or kV according to patient size and/or use of iterative reconstruction technique. CONTRAST:  42mL OMNIPAQUE IOHEXOL 300 MG/ML  SOLN COMPARISON:  Chest CT 05/10/2021. FINDINGS: Cardiovascular: Heart size is normal. There is no significant pericardial fluid, thickening or pericardial calcification. There is aortic atherosclerosis, as well as atherosclerosis of the great vessels of the mediastinum and the coronary arteries, including calcified atherosclerotic plaque in the left anterior descending and  left circumflex coronary arteries. Severe dilatation of the pulmonic trunk (4.1 cm in diameter). Right internal jugular single-lumen porta cath with tip terminating at the superior cavoatrial junction. Mediastinum/Nodes: No pathologically enlarged mediastinal or hilar lymph nodes. Esophagus is unremarkable in appearance. No axillary lymphadenopathy. Lungs/Pleura: Again noted is chronic volume loss and mass-like architectural distortion in the apex of the right lung involving the right upper lobe and superior segment of the right lower lobe, including a chronic thick-walled fibrocavitary area in the apex of the right hemithorax, stable compared to prior examinations, most compatible with an area of chronic postradiation fibrosis. No new suspicious appearing pulmonary nodules or masses are noted. No acute consolidative airspace disease. Trace right pleural effusion. No left pleural effusion. Upper Abdomen: Aortic atherosclerosis. Colonic diverticulosis noted in the region of the splenic flexure of the colon. Musculoskeletal: There are no aggressive appearing lytic or blastic lesions noted in the visualized portions of the skeleton. IMPRESSION: 1. Stable examination with chronic postradiation changes in the upper right lung, and no findings to suggest  recurrent or metastatic disease in the thorax. 2. Trace right pleural effusion. 3. Dilatation of the pulmonic trunk (4.1 cm in diameter), concerning for pulmonary arterial hypertension. 4. Aortic atherosclerosis, in addition to 2 vessel coronary artery disease. Assessment for potential risk factor modification, dietary therapy or pharmacologic therapy may be warranted, if clinically indicated. 5. Colonic diverticulosis. Aortic Atherosclerosis (ICD10-I70.0). Electronically Signed   By: Vinnie Langton M.D.   On: 08/11/2021 10:22   ? ?ASSESSMENT AND PLAN: This is a very Billy 76 years old African-American male recently diagnosed with a stage IIIA non-small cell lung  cancer, squamous cell carcinoma presented with large right upper lobe lung mass in October 2021. ?The patient underwent a course of concurrent chemoradiation with weekly carboplatin and paclitaxel status post

## 2021-08-25 DIAGNOSIS — I10 Essential (primary) hypertension: Secondary | ICD-10-CM | POA: Diagnosis not present

## 2021-08-25 DIAGNOSIS — E1165 Type 2 diabetes mellitus with hyperglycemia: Secondary | ICD-10-CM | POA: Diagnosis not present

## 2021-08-26 DIAGNOSIS — E119 Type 2 diabetes mellitus without complications: Secondary | ICD-10-CM | POA: Diagnosis not present

## 2021-09-20 ENCOUNTER — Ambulatory Visit: Payer: Medicare Other | Admitting: Gastroenterology

## 2021-10-07 ENCOUNTER — Telehealth: Payer: Self-pay | Admitting: Internal Medicine

## 2021-10-07 NOTE — Telephone Encounter (Signed)
Called patient regarding upcoming July appointment, patient has been called and notified.

## 2021-10-12 ENCOUNTER — Emergency Department (HOSPITAL_COMMUNITY)
Admission: EM | Admit: 2021-10-12 | Discharge: 2021-10-12 | Disposition: A | Discharge status: Home / Self Care | Payer: Medicare Other | Attending: Emergency Medicine | Admitting: Emergency Medicine

## 2021-10-12 ENCOUNTER — Encounter (HOSPITAL_COMMUNITY): Payer: Self-pay

## 2021-10-12 ENCOUNTER — Emergency Department (HOSPITAL_COMMUNITY): Payer: Medicare Other

## 2021-10-12 ENCOUNTER — Other Ambulatory Visit: Payer: Self-pay

## 2021-10-12 DIAGNOSIS — E039 Hypothyroidism, unspecified: Secondary | ICD-10-CM | POA: Diagnosis not present

## 2021-10-12 DIAGNOSIS — E119 Type 2 diabetes mellitus without complications: Secondary | ICD-10-CM | POA: Insufficient documentation

## 2021-10-12 DIAGNOSIS — R300 Dysuria: Secondary | ICD-10-CM | POA: Diagnosis present

## 2021-10-12 DIAGNOSIS — R350 Frequency of micturition: Secondary | ICD-10-CM | POA: Diagnosis not present

## 2021-10-12 DIAGNOSIS — R109 Unspecified abdominal pain: Secondary | ICD-10-CM | POA: Insufficient documentation

## 2021-10-12 DIAGNOSIS — K5730 Diverticulosis of large intestine without perforation or abscess without bleeding: Secondary | ICD-10-CM | POA: Diagnosis not present

## 2021-10-12 DIAGNOSIS — N200 Calculus of kidney: Secondary | ICD-10-CM | POA: Diagnosis not present

## 2021-10-12 DIAGNOSIS — N12 Tubulo-interstitial nephritis, not specified as acute or chronic: Secondary | ICD-10-CM | POA: Diagnosis not present

## 2021-10-12 DIAGNOSIS — D72829 Elevated white blood cell count, unspecified: Secondary | ICD-10-CM | POA: Diagnosis not present

## 2021-10-12 DIAGNOSIS — Z7982 Long term (current) use of aspirin: Secondary | ICD-10-CM | POA: Insufficient documentation

## 2021-10-12 DIAGNOSIS — I10 Essential (primary) hypertension: Secondary | ICD-10-CM | POA: Diagnosis not present

## 2021-10-12 DIAGNOSIS — K429 Umbilical hernia without obstruction or gangrene: Secondary | ICD-10-CM | POA: Diagnosis not present

## 2021-10-12 LAB — CBC WITH DIFFERENTIAL/PLATELET
Abs Immature Granulocytes: 0.03 10*3/uL (ref 0.00–0.07)
Basophils Absolute: 0 10*3/uL (ref 0.0–0.1)
Basophils Relative: 0 %
Eosinophils Absolute: 0 10*3/uL (ref 0.0–0.5)
Eosinophils Relative: 0 %
HCT: 41.5 % (ref 39.0–52.0)
Hemoglobin: 13.3 g/dL (ref 13.0–17.0)
Immature Granulocytes: 0 %
Lymphocytes Relative: 8 %
Lymphs Abs: 0.9 10*3/uL (ref 0.7–4.0)
MCH: 26.8 pg (ref 26.0–34.0)
MCHC: 32 g/dL (ref 30.0–36.0)
MCV: 83.5 fL (ref 80.0–100.0)
Monocytes Absolute: 0.9 10*3/uL (ref 0.1–1.0)
Monocytes Relative: 9 %
Neutro Abs: 8.7 10*3/uL — ABNORMAL HIGH (ref 1.7–7.7)
Neutrophils Relative %: 83 %
Platelets: 185 10*3/uL (ref 150–400)
RBC: 4.97 MIL/uL (ref 4.22–5.81)
RDW: 15.9 % — ABNORMAL HIGH (ref 11.5–15.5)
WBC: 10.6 10*3/uL — ABNORMAL HIGH (ref 4.0–10.5)
nRBC: 0 % (ref 0.0–0.2)

## 2021-10-12 LAB — URINALYSIS, ROUTINE W REFLEX MICROSCOPIC
Bacteria, UA: NONE SEEN
Bilirubin Urine: NEGATIVE
Glucose, UA: NEGATIVE mg/dL
Ketones, ur: NEGATIVE mg/dL
Nitrite: NEGATIVE
Protein, ur: 100 mg/dL — AB
RBC / HPF: >50 RBC/hpf — ABNORMAL HIGH (ref 0–5)
Specific Gravity, Urine: 1.015 (ref 1.005–1.030)
WBC, UA: >50 WBC/hpf — ABNORMAL HIGH (ref 0–5)
pH: 6 (ref 5.0–8.0)

## 2021-10-12 LAB — BASIC METABOLIC PANEL
Anion gap: 7 (ref 5–15)
BUN: 13 mg/dL (ref 8–23)
CO2: 27 mmol/L (ref 22–32)
Calcium: 8.6 mg/dL — ABNORMAL LOW (ref 8.9–10.3)
Chloride: 103 mmol/L (ref 98–111)
Creatinine, Ser: 0.95 mg/dL (ref 0.61–1.24)
GFR, Estimated: >60 mL/min (ref >60)
Glucose, Bld: 124 mg/dL — ABNORMAL HIGH (ref 70–99)
Potassium: 4.2 mmol/L (ref 3.5–5.1)
Sodium: 137 mmol/L (ref 135–145)

## 2021-10-12 LAB — LACTIC ACID, PLASMA
Lactic Acid, Venous: 1.1 mmol/L (ref 0.5–1.9)
Lactic Acid, Venous: 0.8 mmol/L (ref 0.5–1.9)

## 2021-10-12 MED ORDER — CEPHALEXIN 500 MG PO CAPS: 500.0000 mg | ORAL_CAPSULE | Freq: Three times a day (TID) | ORAL | 0 refills | Status: DC

## 2021-10-12 MED ORDER — SODIUM CHLORIDE 0.9 % IV SOLN
1.0000 g | Freq: Once | INTRAVENOUS | Status: AC
  Administered 2021-10-12: 1 g via INTRAVENOUS
  Filled 2021-10-12: qty 10

## 2021-10-12 NOTE — Discharge Instructions (Signed)
You were seen today for urinary symptoms.  You will be treated for a kidney infection.  Take antibiotics as prescribed.  If you develop fevers, nausea, vomiting, any new or worsening symptoms, you should be reevaluated.

## 2021-10-12 NOTE — ED Triage Notes (Signed)
Pt with c/o burning with urination, urinary frequency, and R sided flank pain that started yesterday.

## 2021-10-12 NOTE — ED Provider Notes (Signed)
[facility] EMERGENCY DEPARTMENT Provider Note   CSN: [id number] Arrival date & time: 10/12/21  0112     History  Chief Complaint  Patient presents with   Dysuria    Billy Hall is a 76 y.o. male.  HPI     This is a 76 year old male who presents with dysuria and urinary frequency.  Patient reports 1 day history of symptoms.  Currently has lung cancer and is undergoing treatment.  Developed dysuria, hematuria, urinary frequency.  He does report some right-sided back discomfort.  He has not had any fevers to his knowledge.  No nausea, vomiting.  He has not taken anything for symptoms.  No known history of kidney stones.  Home Medications Prior to Admission medications   Medication Sig Start Date End Date Taking? Authorizing Provider  cephALEXin (KEFLEX) 500 MG capsule Take 1 capsule (500 mg total) by mouth 3 (three) times daily. 10/12/21  Yes Traeh Milroy, Barbette Hair, MD  acetaminophen (TYLENOL) 650 MG CR tablet Take 650 mg by mouth every 8 (eight) hours as needed for pain.    [provider]  aspirin EC 81 MG tablet Take 81 mg by mouth daily.    [provider]  cholecalciferol (VITAMIN D3) 25 MCG (1000 UNIT) tablet Take 1,000 Units by mouth daily.    [provider]  levothyroxine (SYNTHROID) 100 MCG tablet Take 100 mcg by mouth daily. 05/03/21   [provider]  losartan (COZAAR) 50 MG tablet Take 50 mg by mouth daily.     [provider]  metFORMIN (GLUCOPHAGE) 500 MG tablet Take 500 mg by mouth 2 (two) times daily with a meal.     [provider]  ONETOUCH ULTRA test strip 1 each 2 (two) times daily. 03/03/20   [provider]  sildenafil (REVATIO) 20 MG tablet Take 20 mg by mouth daily as needed (ED).     [provider]  simvastatin (ZOCOR) 10 MG tablet Take 10 mg by mouth every evening.     [provider]  tamsulosin (FLOMAX) 0.4 MG CAPS capsule Take 0.4 mg by mouth daily.    [provider]      Allergies    Ace inhibitors    Review of Systems   Review of Systems  Gastrointestinal:  Negative for nausea and vomiting.  Genitourinary:  Positive for dysuria, flank pain and hematuria.  All other systems reviewed and are negative.   Physical Exam Updated Vital Signs BP 130/84   Pulse 89   Temp 99.9 F (37.7 C) (Oral)   Resp 20   Ht 1.753 m (5\' 9" )   Wt 120.7 kg   SpO2 95%   BMI 39.28 kg/m  Physical Exam Vitals and nursing note reviewed.  Constitutional:      Appearance: He is well-developed. He is not ill-appearing.  HENT:     Head: Normocephalic and atraumatic.     Nose: Nose normal.     Mouth/Throat:     Mouth: Mucous membranes are moist.  Eyes:     Pupils: Pupils are equal, round, and reactive to light.  Cardiovascular:     Rate and Rhythm: Normal rate and regular rhythm.     Heart sounds: Normal heart sounds. No murmur heard.    Comments: Port palpable Pulmonary:     Effort: Pulmonary effort is normal. No respiratory distress.     Breath sounds: Normal breath sounds. No wheezing.  Abdominal:     General: Bowel sounds are normal.  Palpations: Abdomen is soft.     Tenderness: There is no abdominal tenderness. There is right CVA tenderness. There is no left CVA tenderness or rebound.  Musculoskeletal:     Cervical back: Neck supple.  Lymphadenopathy:     Cervical: No cervical adenopathy.  Skin:    General: Skin is warm and dry.  Neurological:     Mental Status: He is alert and oriented to person, place, and time.  Psychiatric:        Mood and Affect: Mood normal.     ED Results / Procedures / Treatments   Labs (all labs ordered are listed, but only abnormal results are displayed) Labs Reviewed  URINALYSIS, ROUTINE W REFLEX MICROSCOPIC - Abnormal; Notable for the following components:      Result Value   APPearance CLOUDY (*)    Hgb urine dipstick LARGE (*)    Protein, ur 100 (*)    Leukocytes,Ua LARGE (*)    RBC / HPF  >50 (*)    WBC, UA >50 (*)    All other components within normal limits  CBC WITH DIFFERENTIAL/PLATELET - Abnormal; Notable for the following components:   WBC 10.6 (*)    RDW 15.9 (*)    Neutro Abs 8.7 (*)    All other components within normal limits  BASIC METABOLIC PANEL - Abnormal; Notable for the following components:   Glucose, Bld 124 (*)    Calcium 8.6 (*)    All other components within normal limits  URINE CULTURE  LACTIC ACID, PLASMA  LACTIC ACID, PLASMA    EKG None  Radiology CT Renal Stone Study  Result Date: 10/12/2021 CLINICAL DATA:  Flank pain. Kidney stones suspected. Burning with urination, urinary frequency, and right flank pain starting yesterday. History of right upper lung cancer. EXAM: CT ABDOMEN AND PELVIS WITHOUT CONTRAST TECHNIQUE: Multidetector CT imaging of the abdomen and pelvis was performed following the standard protocol without IV contrast. RADIATION DOSE REDUCTION: This exam was performed according to the departmental dose-optimization program which includes automated exposure control, adjustment of the mA and/or kV according to patient size and/or use of iterative reconstruction technique. COMPARISON:  PET CT 02/06/2020 FINDINGS: Lower chest: Lung bases are clear. Hepatobiliary: No focal liver abnormality is seen. No gallstones, gallbladder wall thickening, or biliary dilatation. Pancreas: Unremarkable. No pancreatic ductal dilatation or surrounding inflammatory changes. Spleen: Normal in size without focal abnormality. Adrenals/Urinary Tract: No adrenal gland nodules. Kidneys are symmetrical in size and shape. No hydronephrosis or hydroureter. There is a 4 mm stone in the lower pole right kidney and a 3 mm stone in the lower pole left kidney. No ureteral stones or bladder stones are demonstrated. The bladder is decompressed. Stomach/Bowel: Stomach, small bowel, and colon are not abnormally distended. No wall thickening or inflammatory changes. Diverticulosis  throughout the colon without evidence of acute diverticulitis. Appendix is normal. Vascular/Lymphatic: Scattered aortic calcifications. No significant vascular findings are present. No enlarged abdominal or pelvic lymph nodes. Reproductive: Prostate gland is not enlarged. Other: Small periumbilical hernia containing fat. No free air or free fluid in the abdomen. Musculoskeletal: No acute or significant osseous findings. IMPRESSION: Nonobstructing intrarenal stones are demonstrated bilaterally. No ureteral stone or obstruction. Mild aortic atherosclerosis. Electronically Signed   By: Lucienne Capers M.D.   On: 10/12/2021 04:06    Procedures Procedures    Medications Ordered in ED Medications  cefTRIAXone (ROCEPHIN) 1 g in sodium chloride 0.9 % 100 mL IVPB (0 g Intravenous Stopped 10/12/21 0455)  ED Course/ Medical Decision Making/ A&P                           Medical Decision Making Amount and/or Complexity of Data Reviewed Labs: ordered. Radiology: ordered.  Risk Prescription drug management.   This patient presents to the ED for concern of dysuria, hematuria, flank pain, this involves an extensive number of treatment options, and is a complaint that carries with it a high risk of complications and morbidity.  I considered the following differential and admission for this acute, potentially life threatening condition.  The differential diagnosis includes pyelonephritis, kidney stone, cystitis, less likely appendicitis  MDM:    This is a 76 year old male who presents with hematuria and dysuria.  He is nontoxic and vital signs are reassuring.  He is afebrile.  He has some right-sided CVA tenderness and symptoms suggestive most likely of UTI.  Would be concern for possible pyelonephritis given location of pain.  Additionally infected kidney stone is a consideration.  Labs obtained.  No significant metabolic derangements.  He does have a slight leukocytosis to 10.6.  Lactate is normal.  No  signs or symptoms of sepsis.  CT scan obtained and shows no evidence of kidney stone.  Urinalysis is consistent with UTI with positive leukocyte esterase, greater than 50 red cells, greater than 50 white cells.  Will send urine culture.  Patient was given IV Rocephin.  Given that he is clinically well-appearing and does not have any significant systemic symptoms, will trial outpatient therapy with Keflex.  Patient and his wife are agreeable to plan.  (Labs, imaging, consults)  Labs: I Ordered, and personally interpreted labs.  The pertinent results include: CBC, BMP, urinalysis, urine culture, lactic  Imaging Studies ordered: I ordered imaging studies including CT stone study I independently visualized and interpreted imaging. I agree with the radiologist interpretation  Additional history obtained from bedside.  External records from outside source obtained and reviewed including oncology notes  Cardiac Monitoring: The patient was maintained on a cardiac monitor.  I personally viewed and interpreted the cardiac monitored which showed an underlying rhythm of: Normal sinus rhythm  Reevaluation: After the interventions noted above, I reevaluated the patient and found that they have :improved  Social Determinants of Health: Lives independently, cancer patient  Disposition: Discharge  Co morbidities that complicate the patient evaluation  Past Medical History:  Diagnosis Date   Arthritis    Depressive disorder    Diabetes mellitus without complication ([facility])    Fatigue    Headache(784.0)    Hyperlipidemia    Hypertension    Hypothyroidism    Snoring      Medicines Meds ordered this encounter  Medications   cefTRIAXone (ROCEPHIN) 1 g in sodium chloride 0.9 % 100 mL IVPB    Order Specific Question:   Antibiotic Indication:    Answer:   UTI   cephALEXin (KEFLEX) 500 MG capsule    Sig: Take 1 capsule (500 mg total) by mouth 3 (three) times daily.    Dispense:  30 capsule     Refill:  0    I have reviewed the patients home medicines and have made adjustments as needed  Problem List / ED Course: Problem List Items Addressed This Visit   None Visit Diagnoses     Pyelonephritis    -  Primary   Relevant Medications   cephALEXin (KEFLEX) 500 MG capsule  Final Clinical Impression(s) / ED Diagnoses Final diagnoses:  Pyelonephritis    Rx / DC Orders ED Discharge Orders          Ordered    cephALEXin (KEFLEX) 500 MG capsule  3 times daily        10/12/21 0511              Mance Vallejo, Barbette Hair, MD 10/12/21 [phone]

## 2021-10-12 NOTE — ED Notes (Addendum)
Patient back from CT.

## 2021-10-12 NOTE — ED Notes (Signed)
Patient transported to CT 

## 2021-10-14 LAB — URINE CULTURE: Culture: >=100000 — AB

## 2021-10-15 ENCOUNTER — Telehealth: Payer: Self-pay

## 2021-10-15 NOTE — Telephone Encounter (Signed)
Post ED Visit - Positive Culture Follow-up  Culture report reviewed by antimicrobial stewardship pharmacist: [facility] Team [x]  Mal Misty [place], [place].D. []  Heide Guile, Pharm.D., BCPS AQ-ID []  Parks Neptune, Pharm.D., BCPS []  Alycia Rossetti, Pharm.D., BCPS []  [place], [place].D., BCPS, AAHIVP []  Legrand Como, Pharm.D., BCPS, AAHIVP []  Salome Arnt, PharmD, BCPS []  Johnnette Gourd, PharmD, BCPS []  Hughes Better, PharmD, BCPS []  Leeroy Cha, PharmD []  Laqueta Linden, PharmD, BCPS []  Albertina Parr, PharmD  [place] Team []  Leodis Sias, PharmD []  Lindell Spar, PharmD []  Royetta Asal, PharmD []  Graylin Shiver, Rph []  Rema Fendt) Glennon Mac, PharmD []  Arlyn Dunning, PharmD []  Netta Cedars, PharmD []  Dia Sitter, PharmD []  Leone Haven, PharmD []  Gretta Arab, PharmD []  Theodis Shove, PharmD []  Peggyann Juba, PharmD []  Reuel Boom, PharmD   Positive urine culture Treated with Cephalexin, organism sensitive to the same and no further patient follow-up is required at this time.  Glennon Hamilton 10/15/2021, 10:24 AM

## 2021-10-18 ENCOUNTER — Other Ambulatory Visit: Payer: Self-pay | Admitting: Physician Assistant

## 2021-10-21 ENCOUNTER — Telehealth: Payer: Self-pay | Admitting: Internal Medicine

## 2021-10-23 DIAGNOSIS — N390 Urinary tract infection, site not specified: Secondary | ICD-10-CM | POA: Diagnosis not present

## 2021-10-23 DIAGNOSIS — J449 Chronic obstructive pulmonary disease, unspecified: Secondary | ICD-10-CM | POA: Diagnosis not present

## 2021-10-23 DIAGNOSIS — C3411 Malignant neoplasm of upper lobe, right bronchus or lung: Secondary | ICD-10-CM | POA: Diagnosis not present

## 2021-10-23 DIAGNOSIS — I10 Essential (primary) hypertension: Secondary | ICD-10-CM | POA: Diagnosis not present

## 2021-10-23 DIAGNOSIS — E118 Type 2 diabetes mellitus with unspecified complications: Secondary | ICD-10-CM | POA: Diagnosis not present

## 2021-10-25 DIAGNOSIS — E1165 Type 2 diabetes mellitus with hyperglycemia: Secondary | ICD-10-CM | POA: Diagnosis not present

## 2021-10-25 DIAGNOSIS — I10 Essential (primary) hypertension: Secondary | ICD-10-CM | POA: Diagnosis not present

## 2021-11-06 DIAGNOSIS — R319 Hematuria, unspecified: Secondary | ICD-10-CM | POA: Diagnosis not present

## 2021-11-11 ENCOUNTER — Inpatient Hospital Stay: Payer: Medicare Other

## 2021-11-11 ENCOUNTER — Inpatient Hospital Stay: Payer: Medicare Other | Attending: Internal Medicine

## 2021-11-11 ENCOUNTER — Ambulatory Visit (HOSPITAL_COMMUNITY)
Admission: RE | Admit: 2021-11-11 | Discharge: 2021-11-11 | Disposition: A | Discharge status: Home / Self Care | Payer: Medicare Other | Source: Ambulatory Visit | Attending: Internal Medicine | Admitting: Internal Medicine

## 2021-11-11 ENCOUNTER — Other Ambulatory Visit: Payer: Self-pay

## 2021-11-11 DIAGNOSIS — Z79899 Other long term (current) drug therapy: Secondary | ICD-10-CM | POA: Insufficient documentation

## 2021-11-11 DIAGNOSIS — R0609 Other forms of dyspnea: Secondary | ICD-10-CM | POA: Insufficient documentation

## 2021-11-11 DIAGNOSIS — Z9221 Personal history of antineoplastic chemotherapy: Secondary | ICD-10-CM | POA: Insufficient documentation

## 2021-11-11 DIAGNOSIS — M47814 Spondylosis without myelopathy or radiculopathy, thoracic region: Secondary | ICD-10-CM | POA: Insufficient documentation

## 2021-11-11 DIAGNOSIS — C3490 Malignant neoplasm of unspecified part of unspecified bronchus or lung: Secondary | ICD-10-CM | POA: Insufficient documentation

## 2021-11-11 DIAGNOSIS — C3411 Malignant neoplasm of upper lobe, right bronchus or lung: Secondary | ICD-10-CM | POA: Insufficient documentation

## 2021-11-11 DIAGNOSIS — Z95828 Presence of other vascular implants and grafts: Secondary | ICD-10-CM

## 2021-11-11 DIAGNOSIS — Z87440 Personal history of urinary (tract) infections: Secondary | ICD-10-CM | POA: Insufficient documentation

## 2021-11-11 DIAGNOSIS — Z79890 Hormone replacement therapy (postmenopausal): Secondary | ICD-10-CM | POA: Insufficient documentation

## 2021-11-11 DIAGNOSIS — I700 Atherosclerosis of aorta: Secondary | ICD-10-CM | POA: Insufficient documentation

## 2021-11-11 DIAGNOSIS — J984 Other disorders of lung: Secondary | ICD-10-CM | POA: Insufficient documentation

## 2021-11-11 DIAGNOSIS — Z923 Personal history of irradiation: Secondary | ICD-10-CM | POA: Insufficient documentation

## 2021-11-11 DIAGNOSIS — R1310 Dysphagia, unspecified: Secondary | ICD-10-CM | POA: Insufficient documentation

## 2021-11-11 LAB — CBC WITH DIFFERENTIAL (CANCER CENTER ONLY)
Abs Immature Granulocytes: 0.02 10*3/uL (ref 0.00–0.07)
Basophils Absolute: 0 10*3/uL (ref 0.0–0.1)
Basophils Relative: 1 %
Eosinophils Absolute: 0.1 10*3/uL (ref 0.0–0.5)
Eosinophils Relative: 1 %
HCT: 39.1 % (ref 39.0–52.0)
Hemoglobin: 13 g/dL (ref 13.0–17.0)
Immature Granulocytes: 0 %
Lymphocytes Relative: 17 %
Lymphs Abs: 0.9 10*3/uL (ref 0.7–4.0)
MCH: 27.3 pg (ref 26.0–34.0)
MCHC: 33.2 g/dL (ref 30.0–36.0)
MCV: 82 fL (ref 80.0–100.0)
Monocytes Absolute: 0.6 10*3/uL (ref 0.1–1.0)
Monocytes Relative: 12 %
Neutro Abs: 3.6 10*3/uL (ref 1.7–7.7)
Neutrophils Relative %: 69 %
Platelet Count: 168 10*3/uL (ref 150–400)
RBC: 4.77 MIL/uL (ref 4.22–5.81)
RDW: 15.5 % (ref 11.5–15.5)
WBC Count: 5.2 10*3/uL (ref 4.0–10.5)
nRBC: 0 % (ref 0.0–0.2)

## 2021-11-11 LAB — CMP (CANCER CENTER ONLY)
ALT: 10 U/L (ref 0–44)
AST: 11 U/L — ABNORMAL LOW (ref 15–41)
Albumin: 3.8 g/dL (ref 3.5–5.0)
Alkaline Phosphatase: 60 U/L (ref 38–126)
Anion gap: 4 — ABNORMAL LOW (ref 5–15)
BUN: 11 mg/dL (ref 8–23)
CO2: 30 mmol/L (ref 22–32)
Calcium: 9 mg/dL (ref 8.9–10.3)
Chloride: 104 mmol/L (ref 98–111)
Creatinine: 0.74 mg/dL (ref 0.61–1.24)
GFR, Estimated: >60 mL/min (ref >60)
Glucose, Bld: 123 mg/dL — ABNORMAL HIGH (ref 70–99)
Potassium: 3.9 mmol/L (ref 3.5–5.1)
Sodium: 138 mmol/L (ref 135–145)
Total Bilirubin: 0.6 mg/dL (ref 0.3–1.2)
Total Protein: 6.8 g/dL (ref 6.5–8.1)

## 2021-11-11 MED ORDER — IOHEXOL 300 MG/ML  SOLN
75.0000 mL | Freq: Once | INTRAMUSCULAR | Status: AC | PRN
  Administered 2021-11-11: 75 mL via INTRAVENOUS

## 2021-11-11 MED ORDER — SODIUM CHLORIDE 0.9% FLUSH
10.0000 mL | Freq: Once | INTRAVENOUS | Status: AC
  Administered 2021-11-11: 10 mL

## 2021-11-11 MED ORDER — HEPARIN SOD (PORK) LOCK FLUSH 100 UNIT/ML IV SOLN
INTRAVENOUS | Status: AC
  Filled 2021-11-11: qty 5

## 2021-11-11 MED ORDER — HEPARIN SOD (PORK) LOCK FLUSH 100 UNIT/ML IV SOLN
500.0000 [IU] | Freq: Once | INTRAVENOUS | Status: AC
  Administered 2021-11-11: 500 [IU] via INTRAVENOUS

## 2021-11-11 MED ORDER — SODIUM CHLORIDE (PF) 0.9 % IJ SOLN
INTRAMUSCULAR | Status: AC
  Filled 2021-11-11: qty 50

## 2021-11-13 ENCOUNTER — Ambulatory Visit: Payer: Medicare Other | Admitting: Internal Medicine

## 2021-11-13 ENCOUNTER — Other Ambulatory Visit: Payer: Self-pay

## 2021-11-13 ENCOUNTER — Inpatient Hospital Stay: Payer: Medicare Other | Admitting: Internal Medicine

## 2021-11-13 VITALS — BP 140/71 | HR 79 | Temp 97.5°F | Resp 17 | Wt 269.1 lb

## 2021-11-13 DIAGNOSIS — Z923 Personal history of irradiation: Secondary | ICD-10-CM | POA: Diagnosis not present

## 2021-11-13 DIAGNOSIS — Z79890 Hormone replacement therapy (postmenopausal): Secondary | ICD-10-CM | POA: Diagnosis not present

## 2021-11-13 DIAGNOSIS — Z87440 Personal history of urinary (tract) infections: Secondary | ICD-10-CM | POA: Diagnosis not present

## 2021-11-13 DIAGNOSIS — C3490 Malignant neoplasm of unspecified part of unspecified bronchus or lung: Secondary | ICD-10-CM

## 2021-11-13 DIAGNOSIS — R1310 Dysphagia, unspecified: Secondary | ICD-10-CM | POA: Diagnosis not present

## 2021-11-13 DIAGNOSIS — M47814 Spondylosis without myelopathy or radiculopathy, thoracic region: Secondary | ICD-10-CM | POA: Diagnosis not present

## 2021-11-13 DIAGNOSIS — R0609 Other forms of dyspnea: Secondary | ICD-10-CM | POA: Diagnosis not present

## 2021-11-13 DIAGNOSIS — C3411 Malignant neoplasm of upper lobe, right bronchus or lung: Secondary | ICD-10-CM | POA: Diagnosis not present

## 2021-11-13 DIAGNOSIS — Z9221 Personal history of antineoplastic chemotherapy: Secondary | ICD-10-CM | POA: Diagnosis not present

## 2021-11-13 DIAGNOSIS — J984 Other disorders of lung: Secondary | ICD-10-CM | POA: Diagnosis not present

## 2021-11-13 DIAGNOSIS — I700 Atherosclerosis of aorta: Secondary | ICD-10-CM | POA: Diagnosis not present

## 2021-11-13 DIAGNOSIS — Z79899 Other long term (current) drug therapy: Secondary | ICD-10-CM | POA: Diagnosis not present

## 2021-11-13 NOTE — Progress Notes (Signed)
[place] Telephone:([phone]) [phone]   [phone]  OFFICE PROGRESS NOTE  [name], MD [place] [address]  DIAGNOSIS: Stage IIIA (T4, N0, M0) non-small cell lung cancer, squamous cell carcinoma presented with large right upper lobe lung mass diagnosed in October 2021.   PRIOR THERAPY:  1) Concurrent chemoradiation with weekly carboplatin for AUC of 2 and paclitaxel 45 mg/M2. Status post 6 cycles. First dose on 02/27/20.  Last dose was given April 02, 2020. 2) Consolidation treatment with immunotherapy with Imfinzi 1500 mg IV every 4 weeks.  First dose May 10, 2020.  Status post 13 cycles.  CURRENT THERAPY: Observation  INTERVAL HISTORY: Billy Hall 76 y.o. male returns to the clinic today for 3-month follow-up visit.  The patient is feeling fine today with no concerning complaints.  He was recently treated for urinary tract infection with a course of antibiotic with Keflex that is completed today.  He denied having any chest pain but has shortness of breath with exertion with no cough or hemoptysis.  He denied having any fever or chills.  He has no nausea, vomiting, diarrhea or constipation.  He has no headache or visual changes.  He had repeat CT scan of the chest performed recently and he is here for evaluation and discussion of his scan results.  MEDICAL HISTORY: Past Medical History:  Diagnosis Date   Arthritis    Depressive disorder    Diabetes mellitus without complication (HCC)    Fatigue    Headache(784.0)    Hyperlipidemia    Hypertension    Hypothyroidism    Snoring     ALLERGIES:  is allergic to ace inhibitors.  MEDICATIONS:  Current Outpatient Medications  Medication Sig Dispense Refill   acetaminophen (TYLENOL) 650 MG CR tablet Take 650 mg by mouth every 8 (eight) hours as needed for pain.     aspirin EC 81 MG tablet Take 81 mg by mouth daily.     cholecalciferol (VITAMIN D3) 25 MCG (1000 UNIT)  tablet Take 1,000 Units by mouth daily.     levothyroxine (SYNTHROID) 100 MCG tablet Take 100 mcg by mouth daily.     losartan (COZAAR) 50 MG tablet Take 50 mg by mouth daily.      metFORMIN (GLUCOPHAGE) 500 MG tablet Take 500 mg by mouth 2 (two) times daily with a meal.      ONETOUCH ULTRA test strip 1 each 2 (two) times daily.     sildenafil (REVATIO) 20 MG tablet Take 20 mg by mouth daily as needed (ED).      simvastatin (ZOCOR) 10 MG tablet Take 10 mg by mouth every evening.      tamsulosin (FLOMAX) 0.4 MG CAPS capsule Take 0.4 mg by mouth daily.     No current facility-administered medications for this visit.    SURGICAL HISTORY:  Past Surgical History:  Procedure Laterality Date   COLONOSCOPY N/A 03/08/2013   Procedure: COLONOSCOPY;  Surgeon: Jamesetta So, MD;  [facility];  Service: Gastroenterology;  Laterality: N/A;   IR IMAGING GUIDED PORT INSERTION  03/08/2020   IR US GUIDE BX ASP/DRAIN  03/08/2020   KNEE ARTHROSCOPY Right    2006 By Dr. Luna Glasgow at [facility].    THYROID SURGERY     VIDEO BRONCHOSCOPY WITH ENDOBRONCHIAL ULTRASOUND N/A 01/30/2020   Procedure: VIDEO BRONCHOSCOPY WITH ENDOBRONCHIAL ULTRASOUND;  Surgeon: Melrose Nakayama, MD;  Location: [facility];  Service: Thoracic;  Laterality: N/A;  REVIEW OF SYSTEMS:  A comprehensive review of systems was negative except for: Respiratory: positive for dyspnea on exertion   PHYSICAL EXAMINATION: General appearance: alert, cooperative, and no distress Head: Normocephalic, without obvious abnormality, atraumatic Neck: no adenopathy, no JVD, supple, symmetrical, trachea midline, and thyroid not enlarged, symmetric, no tenderness/mass/nodules Lymph nodes: Cervical, supraclavicular, and axillary nodes normal. Resp: clear to auscultation bilaterally Back: symmetric, no curvature. ROM normal. No CVA tenderness. Cardio: regular rate and rhythm, S1, S2 normal, no murmur, click, rub or gallop GI: soft, non-tender; bowel  sounds normal; no masses,  no organomegaly Extremities: extremities normal, atraumatic, no cyanosis or edema  ECOG PERFORMANCE STATUS: 1 - Symptomatic but completely ambulatory  Blood pressure 140/71, pulse 79, temperature (!) 97.5 F (36.4 C), temperature source Temporal, resp. rate 17, weight 269 lb 2 oz (122.1 kg), SpO2 96 %.  LABORATORY DATA: Lab Results  Component Value Date   WBC 5.2 11/11/2021   HGB 13.0 11/11/2021   HCT 39.1 11/11/2021   MCV 82.0 11/11/2021   PLT 168 11/11/2021      Chemistry      Component Value Date/Time   NA 138 11/11/2021 1018   K 3.9 11/11/2021 1018   CL 104 11/11/2021 1018   CO2 30 11/11/2021 1018   BUN 11 11/11/2021 1018   CREATININE 0.74 11/11/2021 1018      Component Value Date/Time   CALCIUM 9.0 11/11/2021 1018   ALKPHOS 60 11/11/2021 1018   AST 11 (L) 11/11/2021 1018   ALT 10 11/11/2021 1018   BILITOT 0.6 11/11/2021 1018       RADIOGRAPHIC STUDIES: CT Chest W Contrast  Result Date: 11/12/2021 CLINICAL DATA:  Primary Cancer Type: Lung Imaging Indication: Routine surveillance Interval therapy since last imaging? No Initial Cancer Diagnosis Date: 01/30/2020; Established by: Biopsy-proven Detailed Pathology: Stage IIIA non-small cell lung cancer, squamous cell carcinoma. Primary Tumor location:  Right upper lobe. Surgeries: Hemithyroidectomy. Chemotherapy: Yes; Ongoing? No; Most recent administration: 04/02/2020 Immunotherapy?  Yes; Type: Imfinzi; Ongoing? No Radiation therapy? Yes; Date Range: 02/20/2020 - 04/05/2020; Target: Right lung * Tracking Code: BO * EXAM: CT CHEST WITH CONTRAST TECHNIQUE: Multidetector CT imaging of the chest was performed during intravenous contrast administration. RADIATION DOSE REDUCTION: This exam was performed according to the departmental dose-optimization program which includes automated exposure control, adjustment of the mA and/or kV according to patient size and/or use of iterative reconstruction technique.  CONTRAST:  72mL OMNIPAQUE IOHEXOL 300 MG/ML  SOLN COMPARISON:  Most recent CT chest 08/09/2021. 02/06/2020 PET-CT. FINDINGS: Cardiovascular: Right Port-A-Cath tip: Lower SVC. Atherosclerotic calcification in the aortic arch. Chronic lack of contrast enhancement in the anterior trunk of superior lobar artery of the right lung compatible with chronic occlusion. Dilated pulmonary trunk at 4.0 cm compatible with pulmonary arterial hypertension. Mediastinum/Nodes: No pathologic adenopathy. Lungs/Pleura: Volume loss and consolidation of the right upper lobe with peripheral cavitation more medial air bronchograms as on the prior exam. Much of this appearance may be therapy related. No aerated portion of the right upper lobe. Right perihilar bandlike density in the right middle lobe compatible with volume loss related to prior radiation therapy. No specific indicators of recurrent intrapulmonary malignancy. Upper Abdomen: 1.7 by 1.4 cm hypodense partially exophytic lesion of the right kidney upper pole, internal density 22 Hounsfield units, favoring a complex cyst. This lesion had a similar density on the precontrast exam of 10/12/2021 and accordingly there is no substantial enhancement. This is not felt to require further workup. Musculoskeletal: Thoracic spondylosis. IMPRESSION:  1. No findings of recurrent malignancy. 2. Prominent volume loss and consolidation of the right upper lobe with peripheral cavitation and central air bronchograms. The right upper lobe pulmonary artery is chronically occluded. 3. Post therapy related findings in the right middle lobe medially. 4. Complex but benign cyst of the right kidney upper pole, this does not require further workup. 5.  Aortic Atherosclerosis (ICD10-I70.0). Electronically Signed   By: Van Clines M.D.   On: 11/12/2021 10:53    ASSESSMENT AND PLAN: This is a very pleasant 76 years old African-American male recently diagnosed with a stage IIIA non-small cell lung  cancer, squamous cell carcinoma presented with large right upper lobe lung mass in October 2021. The patient underwent a course of concurrent chemoradiation with weekly carboplatin and paclitaxel status post 6 cycles.  The patient tolerated this course of treatment fairly well except for mild odynophagia. Imaging studies after the induction phase of concurrent chemoradiation showed interval decrease in the size and significant central cavitation of the previously demonstrated right upper lobe lung mass consistent with response to therapy. The patient underwent a course of consolidation treatment with immunotherapy with Imfinzi 1500 mg IV every 4 weeks status post 13 cycles.   He tolerated his consolidation treatment with immunotherapy fairly well. The patient has been on observation and feeling fine. He had repeat CT scan of the chest performed recently.  I personally and independently reviewed the scans and discussed the results with the patient today. His scan showed no concerning findings for disease recurrence or metastasis. I recommended for him to continue on observation with repeat CT scan of the chest in 3 months. The patient was advised to call immediately if he has any concerning symptoms in the interval. The patient voices understanding of current disease status and treatment options and is in agreement with the current care plan.  All questions were answered. The patient knows to call the clinic with any problems, questions or concerns. We can certainly see the patient much sooner if necessary.   Disclaimer: This note was dictated with voice recognition software. Similar sounding words can inadvertently be transcribed and may not be corrected upon review.

## 2021-11-19 ENCOUNTER — Ambulatory Visit: Payer: Medicare Other | Admitting: Urology

## 2021-11-19 ENCOUNTER — Encounter: Payer: Self-pay | Admitting: Urology

## 2021-11-19 VITALS — BP 144/81 | HR 99 | Ht 69.0 in | Wt 265.0 lb

## 2021-11-19 DIAGNOSIS — N401 Enlarged prostate with lower urinary tract symptoms: Secondary | ICD-10-CM

## 2021-11-19 DIAGNOSIS — R310 Gross hematuria: Secondary | ICD-10-CM

## 2021-11-19 DIAGNOSIS — N138 Other obstructive and reflux uropathy: Secondary | ICD-10-CM | POA: Diagnosis not present

## 2021-11-19 DIAGNOSIS — Z87440 Personal history of urinary (tract) infections: Secondary | ICD-10-CM

## 2021-11-19 LAB — URINALYSIS, ROUTINE W REFLEX MICROSCOPIC
Bilirubin, UA: NEGATIVE
Glucose, UA: NEGATIVE
Ketones, UA: NEGATIVE
Nitrite, UA: NEGATIVE
Protein,UA: NEGATIVE
RBC, UA: NEGATIVE
Specific Gravity, UA: 1.02 (ref 1.005–1.030)
Urobilinogen, Ur: 1 mg/dL (ref 0.2–1.0)
pH, UA: 7 (ref 5.0–7.5)

## 2021-11-19 LAB — MICROSCOPIC EXAMINATION
Epithelial Cells (non renal): NONE SEEN /hpf (ref 0–10)
RBC, Urine: NONE SEEN /hpf (ref 0–2)
Renal Epithel, UA: NONE SEEN /hpf

## 2021-11-19 LAB — BLADDER SCAN AMB NON-IMAGING: Scan Result: 35

## 2021-11-19 NOTE — Progress Notes (Signed)
post void residual =19mL

## 2021-11-19 NOTE — Progress Notes (Signed)
Assessment: 1. History of UTI   2. BPH with obstruction/lower urinary tract symptoms   3. Gross hematuria - likely associated with UTI     Plan: I reviewed the patient's records from his ER visit in June 2023 including provider notes, lab results, and imaging results. I personally reviewed the CT study from 10/12/2021 showing bilateral nonobstructing renal calculi. Request urine culture results from Dr. Hilma Favors Continue tamsulosin. Schedule for further evaluation with cystoscopy in 3-4 weeks.  Chief Complaint:  Chief Complaint  Patient presents with   Urinary Tract Infection    History of Present Illness:  Billy Hall is a 76 y.o. year old male who is seen in consultation from [name], MD for evaluation of recent UTI.  He presented to the emergency room on 10/12/2021 with symptoms of frequency, dysuria, gross hematuria, and right back discomfort.  Symptoms were present for approximately 24 hours.  Urinalysis showed >50 RBCs, >50 WBCs.  Urine culture grew >100 K E. coli, pansensitive.  CT abdomen and pelvis without contrast showed no hydronephrosis or hydroureter, a 4 mm stone in the lower pole of the right kidney and a 3 mm stone in the lower pole of the left kidney.  He was treated with cephalexin.  His symptoms resolved.  He had onset of similar symptoms with dysuria and gross hematuria approximately 2 weeks ago.  This was treated by Dr. Hilma Favors.  No fevers or chills associated with these episodes. He is not having any dysuria or gross hematuria at the present time.  He does report frequency and urgency.  No prior history of UTIs or gross hematuria.  No flank pain.  He was previously seen in September 2022 for evaluation of a penile lesion.  He noted an area of irritation following condom use.  Examination showed no obvious penile lesions.  DRE demonstrated a 40 g prostate without nodularity or tenderness.   He has recently completed chemoradiation for treatment of lung  cancer.  Past Medical History:  Past Medical History:  Diagnosis Date   Arthritis    Depressive disorder    Diabetes mellitus without complication (HCC)    Fatigue    Headache(784.0)    Hyperlipidemia    Hypertension    Hypothyroidism    Snoring     Past Surgical History:  Past Surgical History:  Procedure Laterality Date   COLONOSCOPY N/A 03/08/2013   Procedure: COLONOSCOPY;  Surgeon: Jamesetta So, MD;  [facility];  Service: Gastroenterology;  Laterality: N/A;   IR IMAGING GUIDED PORT INSERTION  03/08/2020   IR US GUIDE BX ASP/DRAIN  03/08/2020   KNEE ARTHROSCOPY Right    2006 By Dr. Luna Glasgow at [facility].    THYROID SURGERY     VIDEO BRONCHOSCOPY WITH ENDOBRONCHIAL ULTRASOUND N/A 01/30/2020   Procedure: VIDEO BRONCHOSCOPY WITH ENDOBRONCHIAL ULTRASOUND;  Surgeon: Melrose Nakayama, MD;  Location: [facility];  Service: Thoracic;  Laterality: N/A;    Allergies:  Allergies  Allergen Reactions   Ace Inhibitors Swelling and Rash    Family History:  Family History  Problem Relation Age of Onset   Alzheimer's disease Mother     Social History:  Social History   Tobacco Use   Smoking status: Former   Smokeless tobacco: Never   Tobacco comments:    Quit 2004  Vaping Use   Vaping Use: Never used  Substance Use Topics   Alcohol use: Not Currently    Alcohol/week: 0.0 standard drinks of alcohol  Comment: has quit since 2001   Drug use: No    Review of symptoms:  Constitutional:  Negative for unexplained weight loss, night sweats, fever, chills ENT:  Negative for nose bleeds, sinus pain, painful swallowing CV:  Negative for chest pain, shortness of breath, exercise intolerance, palpitations, loss of consciousness Resp:  Negative for cough, wheezing, shortness of breath GI:  Negative for nausea, vomiting, diarrhea, bloody stools GU:  Positives noted in HPI; otherwise negative for urinary incontinence Neuro:  Negative for seizures, poor balance, limb  weakness, slurred speech Psych:  Negative for lack of energy, depression, anxiety Endocrine:  Negative for polydipsia, polyuria, symptoms of hypoglycemia (dizziness, hunger, sweating) Hematologic:  Negative for anemia, purpura, petechia, prolonged or excessive bleeding, use of anticoagulants  Allergic:  Negative for difficulty breathing or choking as a result of exposure to anything; no shellfish allergy; no allergic response (rash/itch) to materials, foods  Physical exam: BP (!) 144/81   Pulse 99   Ht 5\' 9"  (1.753 m)   Wt 265 lb (120.2 kg)   BMI 39.13 kg/m  GENERAL APPEARANCE:  Well appearing, well developed, well nourished, NAD HEENT:  Atraumatic, normocephalic, oropharynx clear NECK:  Supple without lymphadenopathy or thyromegaly ABDOMEN:  Soft, non-tender, no masses EXTREMITIES:  Moves all extremities well, without clubbing, cyanosis, or edema NEUROLOGIC:  Alert and oriented x 3, normal gait, CN II-XII grossly intact MENTAL STATUS:  appropriate BACK:  Non-tender to palpation, No CVAT SKIN:  Warm, dry, and intact  Results: U/A: 0-5 WBCs, few bacteria  PVR: 35 ml

## 2021-11-21 ENCOUNTER — Other Ambulatory Visit: Payer: Self-pay

## 2021-11-28 ENCOUNTER — Encounter: Payer: Self-pay | Admitting: Urology

## 2021-11-28 ENCOUNTER — Ambulatory Visit (INDEPENDENT_AMBULATORY_CARE_PROVIDER_SITE_OTHER): Payer: Medicare Other | Admitting: Urology

## 2021-11-28 DIAGNOSIS — Z87440 Personal history of urinary (tract) infections: Secondary | ICD-10-CM

## 2021-11-28 DIAGNOSIS — R339 Retention of urine, unspecified: Secondary | ICD-10-CM

## 2021-11-28 DIAGNOSIS — R310 Gross hematuria: Secondary | ICD-10-CM | POA: Diagnosis not present

## 2021-11-28 LAB — URINALYSIS, ROUTINE W REFLEX MICROSCOPIC
Bilirubin, UA: NEGATIVE
Glucose, UA: NEGATIVE
Ketones, UA: NEGATIVE
Nitrite, UA: NEGATIVE
Specific Gravity, UA: 1.015 (ref 1.005–1.030)
Urobilinogen, Ur: 1 mg/dL (ref 0.2–1.0)
pH, UA: 6.5 (ref 5.0–7.5)

## 2021-11-28 LAB — MICROSCOPIC EXAMINATION
Renal Epithel, UA: NONE SEEN /hpf
WBC, UA: >30 /hpf — AB (ref 0–5)

## 2021-11-28 MED ORDER — SULFAMETHOXAZOLE-TRIMETHOPRIM 800-160 MG PO TABS: 1.0000 | ORAL_TABLET | Freq: Two times a day (BID) | ORAL | 0 refills | Status: DC

## 2021-11-28 NOTE — Progress Notes (Signed)
Patient present in office today complaining of uti symptoms and feeling of incomplete bladder emptying  post void residual=17  UA reviewed with Dr. Felipa Eth and patient started on Bactrim DS bid x 7 days.

## 2021-12-02 LAB — URINE CULTURE

## 2021-12-04 ENCOUNTER — Telehealth: Payer: Self-pay

## 2021-12-04 NOTE — Telephone Encounter (Signed)
-----   Message from Primus Bravo, MD sent at 12/04/2021  8:12 AM EDT ----- Please notify the patient that his urine culture did show evidence of UTI.  The Bactrim prescribed is appropriate treatment.  Keep follow-up as scheduled.

## 2021-12-04 NOTE — Telephone Encounter (Signed)
Notified patient urine culture did not show evidence of UTI and Bactrim is the correct treatment. Patient was advised to keep follow up appt and patient voice understanding.

## 2021-12-09 DIAGNOSIS — E118 Type 2 diabetes mellitus with unspecified complications: Secondary | ICD-10-CM | POA: Diagnosis not present

## 2021-12-09 DIAGNOSIS — E039 Hypothyroidism, unspecified: Secondary | ICD-10-CM | POA: Diagnosis not present

## 2021-12-09 DIAGNOSIS — J449 Chronic obstructive pulmonary disease, unspecified: Secondary | ICD-10-CM | POA: Diagnosis not present

## 2021-12-09 DIAGNOSIS — R319 Hematuria, unspecified: Secondary | ICD-10-CM | POA: Diagnosis not present

## 2021-12-09 DIAGNOSIS — C3411 Malignant neoplasm of upper lobe, right bronchus or lung: Secondary | ICD-10-CM | POA: Diagnosis not present

## 2021-12-12 ENCOUNTER — Ambulatory Visit: Payer: Medicare Other | Admitting: Urology

## 2021-12-12 ENCOUNTER — Encounter: Payer: Self-pay | Admitting: Urology

## 2021-12-12 VITALS — BP 154/80 | HR 75 | Ht 69.0 in | Wt 266.0 lb

## 2021-12-12 DIAGNOSIS — R310 Gross hematuria: Secondary | ICD-10-CM

## 2021-12-12 DIAGNOSIS — N138 Other obstructive and reflux uropathy: Secondary | ICD-10-CM

## 2021-12-12 DIAGNOSIS — Z87440 Personal history of urinary (tract) infections: Secondary | ICD-10-CM | POA: Diagnosis not present

## 2021-12-12 DIAGNOSIS — R350 Frequency of micturition: Secondary | ICD-10-CM

## 2021-12-12 MED ORDER — CIPROFLOXACIN HCL 500 MG PO TABS
500.0000 mg | ORAL_TABLET | Freq: Once | ORAL | Status: AC
  Administered 2021-12-12: 500 mg via ORAL

## 2021-12-12 NOTE — Progress Notes (Signed)
Assessment: 1. History of UTI   2. BPH with obstruction/lower urinary tract symptoms   3. Gross hematuria - likely associated with UTI     Plan: Continue tamsulosin. Cipro x1 following cystoscopy. Return to office in 6 weeks.  Chief Complaint:  Chief Complaint  Patient presents with   Hematuria    History of Present Illness:  Billy Hall is a 76 y.o. year old male who is seen for further evaluation of recent UTI.  He presented to the emergency room on 10/12/2021 with symptoms of frequency, dysuria, gross hematuria, and right back discomfort.  Symptoms were present for approximately 24 hours.  Urinalysis showed >50 RBCs, >50 WBCs.  Urine culture grew >100 K E. coli, pansensitive.  CT abdomen and pelvis without contrast showed no hydronephrosis or hydroureter, a 4 mm stone in the lower pole of the right kidney and a 3 mm stone in the lower pole of the left kidney.  He was treated with cephalexin.  His symptoms resolved.  He had onset of similar symptoms with dysuria and gross hematuria in July 2023.  This was treated by Dr. Hilma Favors.  No fevers or chills associated with these episodes. He was not having any dysuria or gross hematuria at his visit in July 2023.  He reported frequency and urgency.  No prior history of UTIs or gross hematuria.  No flank pain. Urinalysis showed no evidence of UTI.  He was previously seen in September 2022 for evaluation of a penile lesion.  He noted an area of irritation following condom use.  Examination showed no obvious penile lesions.  DRE demonstrated a 40 g prostate without nodularity or tenderness.   He has recently completed chemoradiation for treatment of lung cancer.  He developed symptoms similar to his prior UTI episodes in early August 2023.  Urinalysis showed >30 WBCs.  Urine culture grew 50-100 K E. coli.  He was treated with Bactrim.  He returns today for follow-up.  He reports improvement in his urinary symptoms following treatment  of the UTI.  He is not having any dysuria or recent gross hematuria.  He does have some frequency.  He continues on tamsulosin. IPSS = 8 today.   Portions of the above documentation were copied from a prior visit for review purposes only.   Past Medical History:  Past Medical History:  Diagnosis Date   Arthritis    Depressive disorder    Diabetes mellitus without complication (HCC)    Fatigue    Headache(784.0)    Hyperlipidemia    Hypertension    Hypothyroidism    Snoring     Past Surgical History:  Past Surgical History:  Procedure Laterality Date   COLONOSCOPY N/A 03/08/2013   Procedure: COLONOSCOPY;  Surgeon: Jamesetta So, MD;  [facility];  Service: Gastroenterology;  Laterality: N/A;   IR IMAGING GUIDED PORT INSERTION  03/08/2020   IR US GUIDE BX ASP/DRAIN  03/08/2020   KNEE ARTHROSCOPY Right    2006 By Dr. Luna Glasgow at [facility].    THYROID SURGERY     VIDEO BRONCHOSCOPY WITH ENDOBRONCHIAL ULTRASOUND N/A 01/30/2020   Procedure: VIDEO BRONCHOSCOPY WITH ENDOBRONCHIAL ULTRASOUND;  Surgeon: Melrose Nakayama, MD;  Location: [facility];  Service: Thoracic;  Laterality: N/A;    Allergies:  Allergies  Allergen Reactions   Ace Inhibitors Swelling and Rash    Family History:  Family History  Problem Relation Age of Onset   Alzheimer's disease Mother     Social History:  Social History   Tobacco Use   Smoking status: Former   Smokeless tobacco: Never   Tobacco comments:    Quit 2004  Vaping Use   Vaping Use: Never used  Substance Use Topics   Alcohol use: Not Currently    Alcohol/week: 0.0 standard drinks of alcohol    Comment: has quit since 2001   Drug use: No    ROS: Constitutional:  Negative for fever, chills, weight loss CV: Negative for chest pain, previous MI, hypertension Respiratory:  Negative for shortness of breath, wheezing, sleep apnea, frequent cough GI:  Negative for nausea, vomiting, bloody stool, GERD  Physical exam: BP (!)  154/80   Pulse 75   Ht 5\' 9"  (1.753 m)   Wt 266 lb (120.7 kg)   BMI 39.28 kg/m  GENERAL APPEARANCE:  Well appearing, well developed, well nourished, NAD HEENT:  Atraumatic, normocephalic, oropharynx clear NECK:  Supple without lymphadenopathy or thyromegaly ABDOMEN:  Soft, non-tender, no masses EXTREMITIES:  Moves all extremities well, without clubbing, cyanosis, or edema NEUROLOGIC:  Alert and oriented x 3, normal gait, CN II-XII grossly intact MENTAL STATUS:  appropriate BACK:  Non-tender to palpation, No CVAT SKIN:  Warm, dry, and intact  Results: U/A: Dipstick negative  Procedure:  Flexible Cystourethroscopy  Pre-operative Diagnosis:  Gross hematuria, history of UTIs  Post-operative Diagnosis:  Gross hematuria, history of UTIs  Anesthesia:  local with lidocaine jelly  Surgical Narrative:  After appropriate informed consent was obtained, the patient was prepped and draped in the usual sterile fashion in the supine position.  The patient was correctly identified and the proper procedure delineated prior to proceeding.  Sterile lidocaine gel was instilled in the urethra. The flexible cystoscope was introduced without difficulty.  Findings:  Anterior urethra: Normal  Posterior urethra: Lateral lobe hypertrophy  Bladder: Trabeculations; no mucosal lesions  Ureteral orifices: normal with clear efflux bilaterally  Additional findings: None  Saline bladder wash for cytology was not performed.    The cystoscope was then removed.  The patient tolerated the procedure well.

## 2021-12-13 LAB — URINALYSIS, ROUTINE W REFLEX MICROSCOPIC
Bilirubin, UA: NEGATIVE
Glucose, UA: NEGATIVE
Ketones, UA: NEGATIVE
Leukocytes,UA: NEGATIVE
Nitrite, UA: NEGATIVE
Protein,UA: NEGATIVE
RBC, UA: NEGATIVE
Specific Gravity, UA: 1.015 (ref 1.005–1.030)
Urobilinogen, Ur: 0.2 mg/dL (ref 0.2–1.0)
pH, UA: 5.5 (ref 5.0–7.5)

## 2022-01-03 ENCOUNTER — Telehealth: Payer: Self-pay

## 2022-01-03 NOTE — Telephone Encounter (Signed)
Pt Billy Hall stating he had some blood in his sputum.  I have called the pt back and he states it occurred once and was a dark brown color. Pt has been advised to call us back should he continue to experience this or have worsening sx. Pt expressed understanding of this information.

## 2022-01-12 ENCOUNTER — Emergency Department (HOSPITAL_COMMUNITY)
Admission: EM | Admit: 2022-01-12 | Discharge: 2022-01-12 | Disposition: A | Discharge status: Home / Self Care | Payer: Medicare Other | Source: Home / Self Care | Attending: Emergency Medicine | Admitting: Emergency Medicine

## 2022-01-12 ENCOUNTER — Other Ambulatory Visit: Payer: Self-pay

## 2022-01-12 ENCOUNTER — Encounter (HOSPITAL_COMMUNITY): Payer: Self-pay

## 2022-01-12 DIAGNOSIS — R059 Cough, unspecified: Secondary | ICD-10-CM | POA: Diagnosis not present

## 2022-01-12 DIAGNOSIS — Z923 Personal history of irradiation: Secondary | ICD-10-CM | POA: Diagnosis not present

## 2022-01-12 DIAGNOSIS — Z7984 Long term (current) use of oral hypoglycemic drugs: Secondary | ICD-10-CM | POA: Diagnosis not present

## 2022-01-12 DIAGNOSIS — Z20822 Contact with and (suspected) exposure to covid-19: Secondary | ICD-10-CM | POA: Diagnosis not present

## 2022-01-12 DIAGNOSIS — I10 Essential (primary) hypertension: Secondary | ICD-10-CM | POA: Insufficient documentation

## 2022-01-12 DIAGNOSIS — D72829 Elevated white blood cell count, unspecified: Secondary | ICD-10-CM | POA: Insufficient documentation

## 2022-01-12 DIAGNOSIS — Z87891 Personal history of nicotine dependence: Secondary | ICD-10-CM | POA: Insufficient documentation

## 2022-01-12 DIAGNOSIS — E119 Type 2 diabetes mellitus without complications: Secondary | ICD-10-CM | POA: Insufficient documentation

## 2022-01-12 DIAGNOSIS — E871 Hypo-osmolality and hyponatremia: Secondary | ICD-10-CM | POA: Diagnosis not present

## 2022-01-12 DIAGNOSIS — E1169 Type 2 diabetes mellitus with other specified complication: Secondary | ICD-10-CM | POA: Diagnosis not present

## 2022-01-12 DIAGNOSIS — N2889 Other specified disorders of kidney and ureter: Secondary | ICD-10-CM | POA: Diagnosis not present

## 2022-01-12 DIAGNOSIS — E039 Hypothyroidism, unspecified: Secondary | ICD-10-CM | POA: Insufficient documentation

## 2022-01-12 DIAGNOSIS — N3946 Mixed incontinence: Secondary | ICD-10-CM

## 2022-01-12 DIAGNOSIS — A419 Sepsis, unspecified organism: Secondary | ICD-10-CM | POA: Diagnosis not present

## 2022-01-12 DIAGNOSIS — N401 Enlarged prostate with lower urinary tract symptoms: Secondary | ICD-10-CM | POA: Diagnosis present

## 2022-01-12 DIAGNOSIS — Z8616 Personal history of COVID-19: Secondary | ICD-10-CM | POA: Diagnosis not present

## 2022-01-12 DIAGNOSIS — Z888 Allergy status to other drugs, medicaments and biological substances status: Secondary | ICD-10-CM | POA: Diagnosis not present

## 2022-01-12 DIAGNOSIS — Z6839 Body mass index (BMI) 39.0-39.9, adult: Secondary | ICD-10-CM | POA: Diagnosis not present

## 2022-01-12 DIAGNOSIS — J440 Chronic obstructive pulmonary disease with acute lower respiratory infection: Secondary | ICD-10-CM | POA: Diagnosis not present

## 2022-01-12 DIAGNOSIS — Z79890 Hormone replacement therapy (postmenopausal): Secondary | ICD-10-CM | POA: Diagnosis not present

## 2022-01-12 DIAGNOSIS — J9601 Acute respiratory failure with hypoxia: Secondary | ICD-10-CM | POA: Diagnosis not present

## 2022-01-12 DIAGNOSIS — J984 Other disorders of lung: Secondary | ICD-10-CM | POA: Diagnosis not present

## 2022-01-12 DIAGNOSIS — N3941 Urge incontinence: Secondary | ICD-10-CM | POA: Insufficient documentation

## 2022-01-12 DIAGNOSIS — K5730 Diverticulosis of large intestine without perforation or abscess without bleeding: Secondary | ICD-10-CM | POA: Diagnosis not present

## 2022-01-12 DIAGNOSIS — R6520 Severe sepsis without septic shock: Secondary | ICD-10-CM | POA: Diagnosis not present

## 2022-01-12 DIAGNOSIS — N138 Other obstructive and reflux uropathy: Secondary | ICD-10-CM | POA: Diagnosis not present

## 2022-01-12 DIAGNOSIS — E1165 Type 2 diabetes mellitus with hyperglycemia: Secondary | ICD-10-CM | POA: Diagnosis not present

## 2022-01-12 DIAGNOSIS — Z79899 Other long term (current) drug therapy: Secondary | ICD-10-CM | POA: Diagnosis not present

## 2022-01-12 DIAGNOSIS — J189 Pneumonia, unspecified organism: Secondary | ICD-10-CM | POA: Diagnosis present

## 2022-01-12 DIAGNOSIS — E669 Obesity, unspecified: Secondary | ICD-10-CM | POA: Diagnosis present

## 2022-01-12 DIAGNOSIS — N200 Calculus of kidney: Secondary | ICD-10-CM | POA: Diagnosis not present

## 2022-01-12 DIAGNOSIS — E785 Hyperlipidemia, unspecified: Secondary | ICD-10-CM | POA: Diagnosis not present

## 2022-01-12 DIAGNOSIS — C3411 Malignant neoplasm of upper lobe, right bronchus or lung: Secondary | ICD-10-CM | POA: Diagnosis not present

## 2022-01-12 DIAGNOSIS — Z7982 Long term (current) use of aspirin: Secondary | ICD-10-CM | POA: Diagnosis not present

## 2022-01-12 DIAGNOSIS — R509 Fever, unspecified: Secondary | ICD-10-CM | POA: Diagnosis not present

## 2022-01-12 DIAGNOSIS — M1990 Unspecified osteoarthritis, unspecified site: Secondary | ICD-10-CM | POA: Diagnosis not present

## 2022-01-12 DIAGNOSIS — Z9221 Personal history of antineoplastic chemotherapy: Secondary | ICD-10-CM | POA: Diagnosis not present

## 2022-01-12 DIAGNOSIS — J90 Pleural effusion, not elsewhere classified: Secondary | ICD-10-CM | POA: Diagnosis not present

## 2022-01-12 DIAGNOSIS — R0902 Hypoxemia: Secondary | ICD-10-CM | POA: Diagnosis not present

## 2022-01-12 DIAGNOSIS — R918 Other nonspecific abnormal finding of lung field: Secondary | ICD-10-CM | POA: Diagnosis not present

## 2022-01-12 DIAGNOSIS — E876 Hypokalemia: Secondary | ICD-10-CM | POA: Diagnosis not present

## 2022-01-12 LAB — CBC WITH DIFFERENTIAL/PLATELET
Abs Immature Granulocytes: 0.06 10*3/uL (ref 0.00–0.07)
Basophils Absolute: 0 10*3/uL (ref 0.0–0.1)
Basophils Relative: 0 %
Eosinophils Absolute: 0 10*3/uL (ref 0.0–0.5)
Eosinophils Relative: 0 %
HCT: 40.2 % (ref 39.0–52.0)
Hemoglobin: 13.1 g/dL (ref 13.0–17.0)
Immature Granulocytes: 1 %
Lymphocytes Relative: 6 %
Lymphs Abs: 0.7 10*3/uL (ref 0.7–4.0)
MCH: 26.4 pg (ref 26.0–34.0)
MCHC: 32.6 g/dL (ref 30.0–36.0)
MCV: 81 fL (ref 80.0–100.0)
Monocytes Absolute: 1.2 10*3/uL — ABNORMAL HIGH (ref 0.1–1.0)
Monocytes Relative: 10 %
Neutro Abs: 10.1 10*3/uL — ABNORMAL HIGH (ref 1.7–7.7)
Neutrophils Relative %: 83 %
Platelets: 270 10*3/uL (ref 150–400)
RBC: 4.96 MIL/uL (ref 4.22–5.81)
RDW: 15.9 % — ABNORMAL HIGH (ref 11.5–15.5)
WBC: 12.1 10*3/uL — ABNORMAL HIGH (ref 4.0–10.5)
nRBC: 0 % (ref 0.0–0.2)

## 2022-01-12 LAB — BASIC METABOLIC PANEL
Anion gap: 11 (ref 5–15)
BUN: 12 mg/dL (ref 8–23)
CO2: 27 mmol/L (ref 22–32)
Calcium: 8.8 mg/dL — ABNORMAL LOW (ref 8.9–10.3)
Chloride: 94 mmol/L — ABNORMAL LOW (ref 98–111)
Creatinine, Ser: 0.88 mg/dL (ref 0.61–1.24)
GFR, Estimated: >60 mL/min (ref >60)
Glucose, Bld: 185 mg/dL — ABNORMAL HIGH (ref 70–99)
Potassium: 4.1 mmol/L (ref 3.5–5.1)
Sodium: 132 mmol/L — ABNORMAL LOW (ref 135–145)

## 2022-01-12 LAB — URINALYSIS, ROUTINE W REFLEX MICROSCOPIC
Bacteria, UA: NONE SEEN
Bilirubin Urine: NEGATIVE
Glucose, UA: NEGATIVE mg/dL
Ketones, ur: 5 mg/dL — AB
Leukocytes,Ua: NEGATIVE
Nitrite: NEGATIVE
Protein, ur: 30 mg/dL — AB
Specific Gravity, Urine: 1.013 (ref 1.005–1.030)
pH: 6 (ref 5.0–8.0)

## 2022-01-12 LAB — LACTIC ACID, PLASMA
Lactic Acid, Venous: 1.1 mmol/L (ref 0.5–1.9)
Lactic Acid, Venous: 0.9 mmol/L (ref 0.5–1.9)

## 2022-01-12 NOTE — ED Provider Notes (Signed)
[facility] EMERGENCY DEPARTMENT Provider Note   CSN: [id number] Arrival date & time: 01/12/22  5465     History  Chief Complaint  Patient presents with   Urinary Incontinence    Billy Hall is a 76 y.o. male.  Patient as above with significant medical history as below, including BPH, DM, HLD, HTN, hypothyroidism who presents to the ED with complaint of urinary incontinence.  Onset of symptoms 2 to 3 days ago.  Patient has the urge to urinate or multiple times throughout the day, he feels he is able to completely empty his bladder but sometimes it takes longer than normal.  He has the urge to urinate and knows when he has to urinate but sometimes cannot make it to the bathroom in time.  He will occasionally have a small amount of dribbling with sneezing or coughing.  No back pain or trauma.  No numbness or tingling.  No fevers or chills.  No IV drug use he has no pain with urination, no hematuria or dysuria.  No penile or testicular swelling.  No abdominal pain.  No nausea or vomiting.  Similar symptoms in the past associate with UTI.  Follows with urology Dr. Felipa Eth, no concern for STI     Past Medical History:  Diagnosis Date   Arthritis    Depressive disorder    Diabetes mellitus without complication ([place])    Fatigue    Headache(784.0)    Hyperlipidemia    Hypertension    Hypothyroidism    Snoring     Past Surgical History:  Procedure Laterality Date   COLONOSCOPY N/A 03/08/2013   Procedure: COLONOSCOPY;  Surgeon: Jamesetta So, MD;  [facility];  Service: Gastroenterology;  Laterality: N/A;   IR IMAGING GUIDED PORT INSERTION  03/08/2020   IR US GUIDE BX ASP/DRAIN  03/08/2020   KNEE ARTHROSCOPY Right    2006 By Dr. Luna Glasgow at [facility].    THYROID SURGERY     VIDEO BRONCHOSCOPY WITH ENDOBRONCHIAL ULTRASOUND N/A 01/30/2020   Procedure: VIDEO BRONCHOSCOPY WITH ENDOBRONCHIAL ULTRASOUND;  Surgeon: Melrose Nakayama, MD;  Location: [place];  Service:  Thoracic;  Laterality: N/A;     The history is provided by the patient. No language interpreter was used.       Home Medications Prior to Admission medications   Medication Sig Start Date End Date Taking? Authorizing Provider  acetaminophen (TYLENOL) 650 MG CR tablet Take 650 mg by mouth every 8 (eight) hours as needed for pain.    [provider]  aspirin EC 81 MG tablet Take 81 mg by mouth daily.    [provider]  cholecalciferol (VITAMIN D3) 25 MCG (1000 UNIT) tablet Take 1,000 Units by mouth daily.    [provider]  levothyroxine (SYNTHROID) 100 MCG tablet Take 100 mcg by mouth daily. 05/03/21   [provider]  losartan (COZAAR) 50 MG tablet Take 50 mg by mouth daily.     [provider]  metFORMIN (GLUCOPHAGE) 500 MG tablet Take 500 mg by mouth 2 (two) times daily with a meal.     [provider]  ONETOUCH ULTRA test strip 1 each 2 (two) times daily. 03/03/20   [provider]  sildenafil (REVATIO) 20 MG tablet Take 20 mg by mouth daily as needed (ED).     [provider]  simvastatin (ZOCOR) 10 MG tablet Take 10 mg by mouth every evening.     [provider]  sulfamethoxazole-trimethoprim (BACTRIM  DS) 800-160 MG tablet Take 1 tablet by mouth 2 (two) times daily. Patient not taking: Reported on 12/12/2021 11/28/21   Primus Bravo., MD  tamsulosin (FLOMAX) 0.4 MG CAPS capsule Take 0.4 mg by mouth daily.    [provider]      Allergies    Ace inhibitors    Review of Systems   Review of Systems  Constitutional:  Negative for chills and fever.  HENT:  Negative for facial swelling and trouble swallowing.   Eyes:  Negative for photophobia and visual disturbance.  Respiratory:  Negative for cough and shortness of breath.   Cardiovascular:  Negative for chest pain and palpitations.  Gastrointestinal:  Negative for abdominal pain, nausea and vomiting.  Endocrine: Negative for polydipsia  and polyuria.  Genitourinary:  Positive for frequency and urgency. Negative for difficulty urinating and hematuria.  Musculoskeletal:  Negative for gait problem and joint swelling.  Skin:  Negative for pallor and rash.  Neurological:  Negative for syncope and headaches.  Psychiatric/Behavioral:  Negative for agitation and confusion.     Physical Exam Updated Vital Signs BP 133/77   Pulse (!) 106   Temp 99.3 F (37.4 C) (Oral)   Resp (!) 22   SpO2 94%  Physical Exam Vitals and nursing note reviewed.  Constitutional:      General: He is not in acute distress.    Appearance: Normal appearance. He is well-developed. He is obese. He is not ill-appearing or diaphoretic.  HENT:     Head: Normocephalic and atraumatic.     Right Ear: External ear normal.     Left Ear: External ear normal.     Mouth/Throat:     Mouth: Mucous membranes are moist.  Eyes:     General: No scleral icterus. Cardiovascular:     Rate and Rhythm: Normal rate and regular rhythm.     Pulses: Normal pulses.     Heart sounds: Normal heart sounds.  Pulmonary:     Effort: Pulmonary effort is normal. No respiratory distress.     Breath sounds: Normal breath sounds.  Abdominal:     General: Abdomen is flat.     Palpations: Abdomen is soft.     Tenderness: There is no abdominal tenderness.  Musculoskeletal:        General: Normal range of motion.     Cervical back: Normal range of motion.     Right lower leg: No edema.     Left lower leg: No edema.  Skin:    General: Skin is warm and dry.     Capillary Refill: Capillary refill takes less than 2 seconds.  Neurological:     Mental Status: He is alert and oriented to person, place, and time.     GCS: GCS eye subscore is 4. GCS verbal subscore is 5. GCS motor subscore is 6.     Gait: Gait is intact.  Psychiatric:        Mood and Affect: Mood normal.        Behavior: Behavior normal.     ED Results / Procedures / Treatments   Labs (all labs ordered are  listed, but only abnormal results are displayed) Labs Reviewed  URINALYSIS, ROUTINE W REFLEX MICROSCOPIC - Abnormal; Notable for the following components:      Result Value   Hgb urine dipstick MODERATE (*)    Ketones, ur 5 (*)    Protein, ur 30 (*)    All other components within normal limits  CBC WITH DIFFERENTIAL/PLATELET - Abnormal; Notable for the following components:   WBC 12.1 (*)    RDW 15.9 (*)    Neutro Abs 10.1 (*)    Monocytes Absolute 1.2 (*)    All other components within normal limits  BASIC METABOLIC PANEL - Abnormal; Notable for the following components:   Sodium 132 (*)    Chloride 94 (*)    Glucose, Bld 185 (*)    Calcium 8.8 (*)    All other components within normal limits  URINE CULTURE  LACTIC ACID, PLASMA  LACTIC ACID, PLASMA    EKG None  Radiology No results found.  Procedures Procedures    Medications Ordered in ED Medications - No data to display  ED Course/ Medical Decision Making/ A&P                           Medical Decision Making  This patient presents to the ED with chief complaint(s) of urinary urgency, frequency with pertinent past medical history of BPH, DM which further complicates the presenting complaint. The complaint involves an extensive differential diagnosis and also carries with it a high risk of complications and morbidity.    The differential diagnosis includes but not limited to UTI, BPH, urge incontinence, stress incontinence, bladder spasm, ATC. Serious etiologies were considered.   The initial plan is to UA, postvoid, labs   Additional history obtained: Additional history obtained from  not applicable Records reviewed Primary Care Documents home medications  Independent labs interpretation:  The following labs were independently interpreted:  Urinalysis without acute infection, wBC is mildly elevated 12.1.  Independent visualization of imaging: Not applicable  Treatment and Reassessment: Postvoid  residual with approximately 30 mL  Symptoms unchanged  Consultation: - Consulted or discussed management/test interpretation w/ external professional: na  Consideration for admission or further workup: Admission was considered   75 year old male here today with urgency, increased urinary frequency.  No evidence of UTI, postvoid residual is negligible, renal function is stable.  He is ambulatory, no abdominal pain, tolerate p.o. intake without difficulty.   Favor mixed incontinence; urgency/stress although urgency appears the be a major component.   Neuro exam is non-focal, no saddle paresthesias, he is ambulatory w/ steady gait, no falls or back pain  Labs stable, no retention on bladder scan. Voiding spontaneously. He has established care with uro; advised to f/u with urology regarding symptoms.   The patient improved significantly and was discharged in stable condition. Detailed discussions were had with the patient regarding current findings, and need for close f/u with PCP or on call doctor. The patient has been instructed to return immediately if the symptoms worsen in any way for re-evaluation. Patient verbalized understanding and is in agreement with current care plan. All questions answered prior to discharge.    Social Determinants of health: Social History   Tobacco Use   Smoking status: Former   Smokeless tobacco: Never   Tobacco comments:    Quit 2004  Vaping Use   Vaping Use: Never used  Substance Use Topics   Alcohol use: Not Currently    Alcohol/week: 0.0 standard drinks of alcohol    Comment: has quit since 2001   Drug use: No            Final Clinical Impression(s) / ED Diagnoses Final diagnoses:  Mixed stress and urge incontinence    Rx / DC Orders ED Discharge Orders     None  Jeanell Sparrow, DO 01/12/22 [phone]

## 2022-01-12 NOTE — ED Notes (Signed)
Post void residual scan 51ml

## 2022-01-12 NOTE — Discharge Instructions (Signed)
It was a pleasure caring for you today in the emergency department.  Please return to the emergency department for any worsening or worrisome symptoms.  Please follow up with your urologist, please call on Monday to set up appointment / follow up from ER visit.

## 2022-01-12 NOTE — ED Triage Notes (Signed)
Pt arrived from home via POV w c/o new onset urinary incontinence that started 2 days ago.

## 2022-01-13 ENCOUNTER — Inpatient Hospital Stay (HOSPITAL_COMMUNITY)
Admission: EM | Admit: 2022-01-13 | Discharge: 2022-01-15 | DRG: 871 | Disposition: A | Discharge status: Home / Self Care | Payer: Medicare Other | Attending: Internal Medicine | Admitting: Internal Medicine

## 2022-01-13 ENCOUNTER — Telehealth: Payer: Self-pay

## 2022-01-13 ENCOUNTER — Emergency Department (HOSPITAL_COMMUNITY): Payer: Medicare Other

## 2022-01-13 ENCOUNTER — Encounter (HOSPITAL_COMMUNITY): Payer: Self-pay | Admitting: Emergency Medicine

## 2022-01-13 DIAGNOSIS — E039 Hypothyroidism, unspecified: Secondary | ICD-10-CM | POA: Diagnosis present

## 2022-01-13 DIAGNOSIS — E876 Hypokalemia: Secondary | ICD-10-CM | POA: Diagnosis present

## 2022-01-13 DIAGNOSIS — E119 Type 2 diabetes mellitus without complications: Secondary | ICD-10-CM

## 2022-01-13 DIAGNOSIS — Z888 Allergy status to other drugs, medicaments and biological substances status: Secondary | ICD-10-CM

## 2022-01-13 DIAGNOSIS — E1165 Type 2 diabetes mellitus with hyperglycemia: Secondary | ICD-10-CM | POA: Diagnosis present

## 2022-01-13 DIAGNOSIS — E785 Hyperlipidemia, unspecified: Secondary | ICD-10-CM | POA: Diagnosis not present

## 2022-01-13 DIAGNOSIS — J9601 Acute respiratory failure with hypoxia: Secondary | ICD-10-CM

## 2022-01-13 DIAGNOSIS — Z79890 Hormone replacement therapy (postmenopausal): Secondary | ICD-10-CM

## 2022-01-13 DIAGNOSIS — I10 Essential (primary) hypertension: Secondary | ICD-10-CM | POA: Diagnosis present

## 2022-01-13 DIAGNOSIS — E669 Obesity, unspecified: Secondary | ICD-10-CM | POA: Diagnosis present

## 2022-01-13 DIAGNOSIS — Z7982 Long term (current) use of aspirin: Secondary | ICD-10-CM | POA: Diagnosis not present

## 2022-01-13 DIAGNOSIS — E871 Hypo-osmolality and hyponatremia: Secondary | ICD-10-CM | POA: Diagnosis present

## 2022-01-13 DIAGNOSIS — Z7984 Long term (current) use of oral hypoglycemic drugs: Secondary | ICD-10-CM | POA: Diagnosis not present

## 2022-01-13 DIAGNOSIS — N3946 Mixed incontinence: Secondary | ICD-10-CM | POA: Diagnosis present

## 2022-01-13 DIAGNOSIS — N401 Enlarged prostate with lower urinary tract symptoms: Secondary | ICD-10-CM | POA: Diagnosis present

## 2022-01-13 DIAGNOSIS — Z9221 Personal history of antineoplastic chemotherapy: Secondary | ICD-10-CM

## 2022-01-13 DIAGNOSIS — Z87891 Personal history of nicotine dependence: Secondary | ICD-10-CM

## 2022-01-13 DIAGNOSIS — Z79899 Other long term (current) drug therapy: Secondary | ICD-10-CM

## 2022-01-13 DIAGNOSIS — R509 Fever, unspecified: Principal | ICD-10-CM

## 2022-01-13 DIAGNOSIS — Z923 Personal history of irradiation: Secondary | ICD-10-CM

## 2022-01-13 DIAGNOSIS — A419 Sepsis, unspecified organism: Secondary | ICD-10-CM | POA: Diagnosis not present

## 2022-01-13 DIAGNOSIS — C3411 Malignant neoplasm of upper lobe, right bronchus or lung: Secondary | ICD-10-CM | POA: Diagnosis present

## 2022-01-13 DIAGNOSIS — E1169 Type 2 diabetes mellitus with other specified complication: Secondary | ICD-10-CM

## 2022-01-13 DIAGNOSIS — N138 Other obstructive and reflux uropathy: Secondary | ICD-10-CM | POA: Diagnosis present

## 2022-01-13 DIAGNOSIS — J984 Other disorders of lung: Secondary | ICD-10-CM | POA: Diagnosis not present

## 2022-01-13 DIAGNOSIS — R918 Other nonspecific abnormal finding of lung field: Secondary | ICD-10-CM | POA: Diagnosis present

## 2022-01-13 DIAGNOSIS — M1990 Unspecified osteoarthritis, unspecified site: Secondary | ICD-10-CM | POA: Diagnosis present

## 2022-01-13 DIAGNOSIS — J189 Pneumonia, unspecified organism: Secondary | ICD-10-CM | POA: Diagnosis present

## 2022-01-13 DIAGNOSIS — J440 Chronic obstructive pulmonary disease with acute lower respiratory infection: Secondary | ICD-10-CM | POA: Diagnosis present

## 2022-01-13 DIAGNOSIS — R6520 Severe sepsis without septic shock: Secondary | ICD-10-CM

## 2022-01-13 DIAGNOSIS — Z6839 Body mass index (BMI) 39.0-39.9, adult: Secondary | ICD-10-CM | POA: Diagnosis not present

## 2022-01-13 DIAGNOSIS — R0902 Hypoxemia: Secondary | ICD-10-CM | POA: Diagnosis not present

## 2022-01-13 DIAGNOSIS — Z820 Family history of epilepsy and other diseases of the nervous system: Secondary | ICD-10-CM

## 2022-01-13 DIAGNOSIS — Z20822 Contact with and (suspected) exposure to covid-19: Secondary | ICD-10-CM | POA: Diagnosis present

## 2022-01-13 DIAGNOSIS — Z85118 Personal history of other malignant neoplasm of bronchus and lung: Secondary | ICD-10-CM

## 2022-01-13 DIAGNOSIS — N2889 Other specified disorders of kidney and ureter: Secondary | ICD-10-CM | POA: Diagnosis present

## 2022-01-13 DIAGNOSIS — Z8616 Personal history of COVID-19: Secondary | ICD-10-CM

## 2022-01-13 LAB — BLOOD GAS, VENOUS
Acid-Base Excess: 4.5 mmol/L — ABNORMAL HIGH (ref 0.0–2.0)
Bicarbonate: 29.8 mmol/L — ABNORMAL HIGH (ref 20.0–28.0)
Drawn by: 12815
O2 Saturation: 75.3 %
Patient temperature: 37.8
pCO2, Ven: 49 mmHg (ref 44–60)
pH, Ven: 7.4 (ref 7.25–7.43)
pO2, Ven: 45 mmHg (ref 32–45)

## 2022-01-13 LAB — URINALYSIS, ROUTINE W REFLEX MICROSCOPIC
Bacteria, UA: NONE SEEN
Bilirubin Urine: NEGATIVE
Glucose, UA: NEGATIVE mg/dL
Ketones, ur: 5 mg/dL — AB
Leukocytes,Ua: NEGATIVE
Nitrite: NEGATIVE
Protein, ur: 100 mg/dL — AB
Specific Gravity, Urine: 1.02 (ref 1.005–1.030)
pH: 6 (ref 5.0–8.0)

## 2022-01-13 LAB — COMPREHENSIVE METABOLIC PANEL
ALT: 52 U/L — ABNORMAL HIGH (ref 0–44)
AST: 38 U/L (ref 15–41)
Albumin: 2.9 g/dL — ABNORMAL LOW (ref 3.5–5.0)
Alkaline Phosphatase: 131 U/L — ABNORMAL HIGH (ref 38–126)
Anion gap: 7 (ref 5–15)
BUN: 12 mg/dL (ref 8–23)
CO2: 28 mmol/L (ref 22–32)
Calcium: 8.4 mg/dL — ABNORMAL LOW (ref 8.9–10.3)
Chloride: 96 mmol/L — ABNORMAL LOW (ref 98–111)
Creatinine, Ser: 0.88 mg/dL (ref 0.61–1.24)
GFR, Estimated: >60 mL/min (ref >60)
Glucose, Bld: 190 mg/dL — ABNORMAL HIGH (ref 70–99)
Potassium: 3.6 mmol/L (ref 3.5–5.1)
Sodium: 131 mmol/L — ABNORMAL LOW (ref 135–145)
Total Bilirubin: 1.6 mg/dL — ABNORMAL HIGH (ref 0.3–1.2)
Total Protein: 7.9 g/dL (ref 6.5–8.1)

## 2022-01-13 LAB — MRSA NEXT GEN BY PCR, NASAL: MRSA by PCR Next Gen: NOT DETECTED

## 2022-01-13 LAB — CBC
HCT: 34.7 % — ABNORMAL LOW (ref 39.0–52.0)
Hemoglobin: 11.2 g/dL — ABNORMAL LOW (ref 13.0–17.0)
MCH: 26.2 pg (ref 26.0–34.0)
MCHC: 32.3 g/dL (ref 30.0–36.0)
MCV: 81.3 fL (ref 80.0–100.0)
Platelets: 237 10*3/uL (ref 150–400)
RBC: 4.27 MIL/uL (ref 4.22–5.81)
RDW: 15.9 % — ABNORMAL HIGH (ref 11.5–15.5)
WBC: 11.5 10*3/uL — ABNORMAL HIGH (ref 4.0–10.5)
nRBC: 0 % (ref 0.0–0.2)

## 2022-01-13 LAB — CBC WITH DIFFERENTIAL/PLATELET
Abs Immature Granulocytes: 0.06 10*3/uL (ref 0.00–0.07)
Basophils Absolute: 0 10*3/uL (ref 0.0–0.1)
Basophils Relative: 0 %
Eosinophils Absolute: 0 10*3/uL (ref 0.0–0.5)
Eosinophils Relative: 0 %
HCT: 37.8 % — ABNORMAL LOW (ref 39.0–52.0)
Hemoglobin: 12.3 g/dL — ABNORMAL LOW (ref 13.0–17.0)
Immature Granulocytes: 1 %
Lymphocytes Relative: 7 %
Lymphs Abs: 0.9 10*3/uL (ref 0.7–4.0)
MCH: 26.4 pg (ref 26.0–34.0)
MCHC: 32.5 g/dL (ref 30.0–36.0)
MCV: 81.1 fL (ref 80.0–100.0)
Monocytes Absolute: 1.5 10*3/uL — ABNORMAL HIGH (ref 0.1–1.0)
Monocytes Relative: 11 %
Neutro Abs: 10.7 10*3/uL — ABNORMAL HIGH (ref 1.7–7.7)
Neutrophils Relative %: 81 %
Platelets: 261 10*3/uL (ref 150–400)
RBC: 4.66 MIL/uL (ref 4.22–5.81)
RDW: 16 % — ABNORMAL HIGH (ref 11.5–15.5)
WBC: 13.1 10*3/uL — ABNORMAL HIGH (ref 4.0–10.5)
nRBC: 0 % (ref 0.0–0.2)

## 2022-01-13 LAB — GLUCOSE, CAPILLARY
Glucose-Capillary: 247 mg/dL — ABNORMAL HIGH (ref 70–99)
Glucose-Capillary: 298 mg/dL — ABNORMAL HIGH (ref 70–99)

## 2022-01-13 LAB — PROTIME-INR
INR: 1.4 — ABNORMAL HIGH (ref 0.8–1.2)
Prothrombin Time: 16.5 seconds — ABNORMAL HIGH (ref 11.4–15.2)

## 2022-01-13 LAB — CREATININE, SERUM
Creatinine, Ser: 0.74 mg/dL (ref 0.61–1.24)
GFR, Estimated: >60 mL/min (ref >60)

## 2022-01-13 LAB — PHOSPHORUS: Phosphorus: 3 mg/dL (ref 2.5–4.6)

## 2022-01-13 LAB — EXPECTORATED SPUTUM ASSESSMENT W GRAM STAIN, RFLX TO RESP C

## 2022-01-13 LAB — SARS CORONAVIRUS 2 BY RT PCR: SARS Coronavirus 2 by RT PCR: NEGATIVE

## 2022-01-13 LAB — URINE CULTURE: Culture: <10000 — AB

## 2022-01-13 LAB — APTT: aPTT: 35 seconds (ref 24–36)

## 2022-01-13 LAB — HEMOGLOBIN A1C
Hgb A1c MFr Bld: 6.8 % — ABNORMAL HIGH (ref 4.8–5.6)
Mean Plasma Glucose: 148.46 mg/dL

## 2022-01-13 LAB — LACTIC ACID, PLASMA: Lactic Acid, Venous: 1 mmol/L (ref 0.5–1.9)

## 2022-01-13 LAB — STREP PNEUMONIAE URINARY ANTIGEN: Strep Pneumo Urinary Antigen: NEGATIVE

## 2022-01-13 LAB — MAGNESIUM: Magnesium: 2 mg/dL (ref 1.7–2.4)

## 2022-01-13 MED ORDER — HYDRALAZINE HCL 20 MG/ML IJ SOLN: 10.0000 mg | INTRAMUSCULAR | Status: DC | PRN

## 2022-01-13 MED ORDER — IOHEXOL 300 MG/ML  SOLN
100.0000 mL | Freq: Once | INTRAMUSCULAR | Status: AC | PRN
  Administered 2022-01-13: 100 mL via INTRAVENOUS

## 2022-01-13 MED ORDER — LACTATED RINGERS IV BOLUS (SEPSIS): 1000.0000 mL | Freq: Once | INTRAVENOUS | Status: DC

## 2022-01-13 MED ORDER — ACETAMINOPHEN 325 MG PO TABS: 650.0000 mg | ORAL_TABLET | Freq: Four times a day (QID) | ORAL | Status: DC | PRN

## 2022-01-13 MED ORDER — SENNOSIDES-DOCUSATE SODIUM 8.6-50 MG PO TABS: 1.0000 | ORAL_TABLET | Freq: Every evening | ORAL | Status: DC | PRN

## 2022-01-13 MED ORDER — SODIUM CHLORIDE 0.9 % IV SOLN: INTRAVENOUS | Status: DC

## 2022-01-13 MED ORDER — TRAZODONE HCL 50 MG PO TABS
25.0000 mg | ORAL_TABLET | Freq: Every evening | ORAL | Status: DC | PRN
  Administered 2022-01-13 – 2022-01-14 (×2): 25 mg via ORAL
  Filled 2022-01-13 (×2): qty 1

## 2022-01-13 MED ORDER — SODIUM CHLORIDE 0.9% FLUSH
3.0000 mL | INTRAVENOUS | Status: DC | PRN
  Administered 2022-01-13: 3 mL via INTRAVENOUS

## 2022-01-13 MED ORDER — VANCOMYCIN HCL 2000 MG/400ML IV SOLN
2000.0000 mg | Freq: Once | INTRAVENOUS | Status: AC
  Administered 2022-01-13: 2000 mg via INTRAVENOUS
  Filled 2022-01-13: qty 400

## 2022-01-13 MED ORDER — SODIUM CHLORIDE 0.9% FLUSH
3.0000 mL | Freq: Two times a day (BID) | INTRAVENOUS | Status: DC
  Administered 2022-01-13 – 2022-01-15 (×5): 3 mL via INTRAVENOUS

## 2022-01-13 MED ORDER — ASPIRIN 81 MG PO TBEC
81.0000 mg | DELAYED_RELEASE_TABLET | Freq: Every day | ORAL | Status: DC
  Administered 2022-01-13 – 2022-01-15 (×3): 81 mg via ORAL
  Filled 2022-01-13 (×3): qty 1

## 2022-01-13 MED ORDER — ONDANSETRON HCL 4 MG/2ML IJ SOLN: 4.0000 mg | Freq: Four times a day (QID) | INTRAMUSCULAR | Status: DC | PRN

## 2022-01-13 MED ORDER — SODIUM CHLORIDE 0.9 % IV SOLN
2.0000 g | Freq: Once | INTRAVENOUS | Status: AC
  Administered 2022-01-13: 2 g via INTRAVENOUS
  Filled 2022-01-13: qty 20

## 2022-01-13 MED ORDER — LEVOTHYROXINE SODIUM 100 MCG PO TABS
100.0000 ug | ORAL_TABLET | Freq: Every day | ORAL | Status: DC
  Administered 2022-01-13 – 2022-01-15 (×3): 100 ug via ORAL
  Filled 2022-01-13: qty 1
  Filled 2022-01-13: qty 2
  Filled 2022-01-13: qty 1

## 2022-01-13 MED ORDER — BUDESONIDE 0.25 MG/2ML IN SUSP: 0.2500 mg | Freq: Two times a day (BID) | RESPIRATORY_TRACT | Status: DC

## 2022-01-13 MED ORDER — LACTATED RINGERS IV BOLUS (SEPSIS)
1000.0000 mL | Freq: Once | INTRAVENOUS | Status: AC
  Administered 2022-01-13: 1000 mL via INTRAVENOUS

## 2022-01-13 MED ORDER — SODIUM CHLORIDE 0.9 % IV SOLN
2.0000 g | Freq: Three times a day (TID) | INTRAVENOUS | Status: DC
  Administered 2022-01-13 – 2022-01-15 (×5): 2 g via INTRAVENOUS
  Filled 2022-01-13 (×5): qty 12.5

## 2022-01-13 MED ORDER — HYDROMORPHONE HCL 1 MG/ML IJ SOLN: 0.5000 mg | INTRAMUSCULAR | Status: DC | PRN

## 2022-01-13 MED ORDER — ACETAMINOPHEN 650 MG RE SUPP: 650.0000 mg | Freq: Four times a day (QID) | RECTAL | Status: DC | PRN

## 2022-01-13 MED ORDER — DOXYCYCLINE HYCLATE 100 MG PO TABS
100.0000 mg | ORAL_TABLET | Freq: Once | ORAL | Status: AC
  Administered 2022-01-13: 100 mg via ORAL
  Filled 2022-01-13: qty 1

## 2022-01-13 MED ORDER — LACTATED RINGERS IV BOLUS
1000.0000 mL | Freq: Once | INTRAVENOUS | Status: AC
  Administered 2022-01-13: 1000 mL via INTRAVENOUS

## 2022-01-13 MED ORDER — SODIUM CHLORIDE 0.9% FLUSH
3.0000 mL | Freq: Two times a day (BID) | INTRAVENOUS | Status: DC
  Administered 2022-01-13 (×3): 3 mL via INTRAVENOUS

## 2022-01-13 MED ORDER — OXYCODONE HCL 5 MG PO TABS: 5.0000 mg | ORAL_TABLET | ORAL | Status: DC | PRN

## 2022-01-13 MED ORDER — ACETAMINOPHEN 500 MG PO TABS
1000.0000 mg | ORAL_TABLET | Freq: Once | ORAL | Status: AC
  Administered 2022-01-13: 1000 mg via ORAL
  Filled 2022-01-13: qty 2

## 2022-01-13 MED ORDER — VANCOMYCIN HCL IN DEXTROSE 1-5 GM/200ML-% IV SOLN: 1000.0000 mg | Freq: Once | INTRAVENOUS | Status: DC

## 2022-01-13 MED ORDER — SIMVASTATIN 20 MG PO TABS
10.0000 mg | ORAL_TABLET | Freq: Every evening | ORAL | Status: DC
  Administered 2022-01-13 – 2022-01-14 (×2): 10 mg via ORAL
  Filled 2022-01-13 (×2): qty 1

## 2022-01-13 MED ORDER — TAMSULOSIN HCL 0.4 MG PO CAPS
0.4000 mg | ORAL_CAPSULE | Freq: Every day | ORAL | Status: DC
  Administered 2022-01-13 – 2022-01-15 (×3): 0.4 mg via ORAL
  Filled 2022-01-13 (×3): qty 1

## 2022-01-13 MED ORDER — SODIUM CHLORIDE 0.9 % IV SOLN
2.0000 g | Freq: Once | INTRAVENOUS | Status: AC
  Administered 2022-01-13: 2 g via INTRAVENOUS
  Filled 2022-01-13: qty 12.5

## 2022-01-13 MED ORDER — LOSARTAN POTASSIUM 50 MG PO TABS
50.0000 mg | ORAL_TABLET | Freq: Every day | ORAL | Status: DC
  Administered 2022-01-14 – 2022-01-15 (×2): 50 mg via ORAL
  Filled 2022-01-13 (×2): qty 1

## 2022-01-13 MED ORDER — FLEET ENEMA 7-19 GM/118ML RE ENEM: 1.0000 | ENEMA | Freq: Once | RECTAL | Status: DC | PRN

## 2022-01-13 MED ORDER — VANCOMYCIN HCL IN DEXTROSE 1-5 GM/200ML-% IV SOLN
1000.0000 mg | Freq: Two times a day (BID) | INTRAVENOUS | Status: DC
  Administered 2022-01-14: 1000 mg via INTRAVENOUS
  Filled 2022-01-13: qty 200

## 2022-01-13 MED ORDER — IPRATROPIUM BROMIDE 0.02 % IN SOLN: 0.5000 mg | Freq: Four times a day (QID) | RESPIRATORY_TRACT | Status: DC | PRN

## 2022-01-13 MED ORDER — SODIUM CHLORIDE 0.9 % IV SOLN: 250.0000 mL | INTRAVENOUS | Status: DC | PRN

## 2022-01-13 MED ORDER — VITAMIN D 25 MCG (1000 UNIT) PO TABS
1000.0000 [IU] | ORAL_TABLET | Freq: Every day | ORAL | Status: DC
  Administered 2022-01-13 – 2022-01-15 (×3): 1000 [IU] via ORAL
  Filled 2022-01-13 (×3): qty 1

## 2022-01-13 MED ORDER — METHYLPREDNISOLONE SODIUM SUCC 125 MG IJ SOLR
80.0000 mg | INTRAMUSCULAR | Status: DC
  Administered 2022-01-13: 80 mg via INTRAVENOUS
  Filled 2022-01-13: qty 2

## 2022-01-13 MED ORDER — LEVALBUTEROL HCL 0.63 MG/3ML IN NEBU: 0.6300 mg | INHALATION_SOLUTION | Freq: Four times a day (QID) | RESPIRATORY_TRACT | Status: DC | PRN

## 2022-01-13 MED ORDER — ONDANSETRON HCL 4 MG PO TABS: 4.0000 mg | ORAL_TABLET | Freq: Four times a day (QID) | ORAL | Status: DC | PRN

## 2022-01-13 MED ORDER — BISACODYL 5 MG PO TBEC: 5.0000 mg | DELAYED_RELEASE_TABLET | Freq: Every day | ORAL | Status: DC | PRN

## 2022-01-13 MED ORDER — HEPARIN SODIUM (PORCINE) 5000 UNIT/ML IJ SOLN
5000.0000 [IU] | Freq: Three times a day (TID) | INTRAMUSCULAR | Status: DC
  Administered 2022-01-13 – 2022-01-15 (×6): 5000 [IU] via SUBCUTANEOUS
  Filled 2022-01-13 (×5): qty 1

## 2022-01-13 MED ORDER — INSULIN ASPART 100 UNIT/ML IJ SOLN
0.0000 [IU] | Freq: Three times a day (TID) | INTRAMUSCULAR | Status: DC
  Administered 2022-01-13 – 2022-01-14 (×3): 2 [IU] via SUBCUTANEOUS
  Administered 2022-01-14 – 2022-01-15 (×2): 1 [IU] via SUBCUTANEOUS

## 2022-01-13 MED ORDER — BUDESONIDE 0.25 MG/2ML IN SUSP
0.2500 mg | Freq: Two times a day (BID) | RESPIRATORY_TRACT | Status: DC
  Administered 2022-01-13 – 2022-01-15 (×4): 0.25 mg via RESPIRATORY_TRACT
  Filled 2022-01-13 (×4): qty 2

## 2022-01-13 NOTE — ED Notes (Signed)
MD at BS

## 2022-01-13 NOTE — Assessment & Plan Note (Signed)
-   Continue home medication of simvastatin

## 2022-01-13 NOTE — Assessment & Plan Note (Signed)
-   Continue home dose Synthroid °

## 2022-01-13 NOTE — ED Provider Notes (Signed)
  Physical Exam  BP 126/76 (BP Location: Left Arm)   Pulse 84   Temp 98.2 F (36.8 C) (Oral)   Resp (!) 30   Ht _0  (1.753 m)   Wt 120.2 kg   SpO2 94%   BMI 39.13 kg/m   Physical Exam  Procedures  Procedures  ED Course / MDM   Clinical Course as of 01/13/22 1212  Mon Jan 13, 2022  0703 Came with one day of fever and chronic cough for 2 years. Repeat UA. Re-eval likely dc. [VB]  [id number] Patient with non-small cell lung cancer who presented with fever, increased cough.  He was tachycardic, tachypneic and satting 88% upon ambulation.  Obtaining sepsis work-up and scans of chest and abdomen pelvis due to UA having blood and history of kidney stones to ensure no evidence of obstructed kidney stone or pneumonia a.  He is covered with pneumonia medication with Rocephin and doxycycline. [VB]  0915 Lactate 1 and reassuring.  Unlikely septic shock.  Labs remarkable for leukocytosis 13.1 with left shift.  No evidence of infection on UA.  Mild hyponatremia and hypochloremia sodium 131.  Creatinine 0.88 within normal limits.New mild transaminitis ALT 52 alk phos 131 total bilirubin 1.6. [VB]  1212 Patient CT resulted with evidence of suspected right upper lobe infiltrate consistent with symptoms suggestive of pneumonia.  Discussed case with hospitalist will be down to evaluate the patient put in orders for admission. [VB]    Clinical Course User Index [VB] Elgie Congo, MD   Medical Decision Making Amount and/or Complexity of Data Reviewed Labs: ordered. Radiology: ordered.  Risk OTC drugs. Prescription drug management. Decision regarding hospitalization.          Elgie Congo, MD 01/13/22 1212

## 2022-01-13 NOTE — Telephone Encounter (Signed)
-----   Message from Rigoberto Noel, MD sent at 01/13/2022  3:09 PM EDT ----- Please make outpatient appointment, post hospital follow-up for right upper lobe cavitary pneumonia with APP/me in 2 to 4 weeks  RA

## 2022-01-13 NOTE — ED Triage Notes (Signed)
Pt with c/o fever. States it was 100.4 at home. Pt seen here yesterday.

## 2022-01-13 NOTE — Assessment & Plan Note (Addendum)
-   In remission -  patient is to follow-up with a primary oncologist regarding new lesions kidney, and reimaging of right upperlung lesion    -CTA angiogram revealing route upper lobe cavitary lesion consistent with infiltrate versus previous or recurrent neoplasm -We will have the patient follow-up with a primary oncologist for evaluation

## 2022-01-13 NOTE — Progress Notes (Signed)
Pharmacy Antibiotic Note  Billy Hall is a 76 y.o. male admitted on 01/13/2022 with pneumonia.  Pharmacy has been consulted for Vancomycin and cefepime dosing.  Plan: Vancomycin 2000mg  IV loading dose, then 1000 mg IV Q 12 hrs. Goal AUC 400-550. Expected AUC: 517 SCr used: 0.88  Cefepime 2gm IV q8h F/U cxs and clinical progress Monitor V/S, labs and levels as indicated  Height: 5\' 9"  (175.3 cm) Weight: 120.2 kg (265 lb) IBW/kg (Calculated) : 70.7  Temp (24hrs), Avg:98.9 F (37.2 C), Min:98.2 F (36.8 C), Max:100 F (37.8 C)  Recent Labs  Lab 01/12/22 0638 01/12/22 0815 01/13/22 0808  WBC 12.1*  --  13.1*  CREATININE 0.88  --  0.88  LATICACIDVEN 1.1 0.9 1.0    Estimated Creatinine Clearance: 92.8 mL/min (by C-G formula based on SCr of 0.88 mg/dL).    Allergies  Allergen Reactions   Ace Inhibitors Swelling and Rash    Antimicrobials this admission: vancomycin 9/18 >>  cefepime 9/18 >>   Microbiology results: 9/18 BCx: pending 9/18 UCx: pending   MRSA PCR:   Thank you for allowing pharmacy to be a part of this patient's care.  Isac Sarna, BS Pharm D, BCPS Clinical Pharmacist 01/13/2022 12:32 PM

## 2022-01-13 NOTE — Assessment & Plan Note (Addendum)
-   On arrival: This may need some Tmax 100, PR 10 1101-112, RR 22-34, BP as low as 106/88,   Per nursing staff with ambulation desatted to 88% -Currently addressed in bed satting 91-94%  -Continue oxygen as needed to maintain O2 sat > 92% O2 sat 91% on room air  - improved vitals: 126/76, pulse 84, temperature 98.2 F (36.8 C), temperature source Oral, resp. rate (!) 30,weight  SpO2 94 %. -WBC 13.1, mildly hypoxic evaluated, lactic acid 0.0, -SARS-CoV-2 negative  -Imaging CT of the chest may be consistent with right upper lobe opacity consistent with pneumonia -in the setting of previous cancer site    -Initiate incentive protocol, IV fluid hydration, broad-spectrum antibiotics cefepime and vancomycin has been initiated  -We will follow-up with blood cultures according

## 2022-01-13 NOTE — Assessment & Plan Note (Signed)
-   Holding home occasional Glucophage -Checking CBG QA CHS with SSI coverage -A1c: -Last A1c 01/27/2020 6.7

## 2022-01-13 NOTE — ED Notes (Addendum)
SPO2 98% down to 88% while walking, on RA. Returned to 94% at rest in stretcher.

## 2022-01-13 NOTE — ED Notes (Signed)
EDP at [facility]. Congested cough continues.

## 2022-01-13 NOTE — Telephone Encounter (Signed)
HFU made for  office. Appt reminder mailed and appt will show on AVS at discharge

## 2022-01-13 NOTE — Consult Note (Signed)
NAME:  Billy Hall, MRN:  [id number], DOB:  1945/07/19, LOS: 0 ADMISSION DATE:  01/13/2022, CONSULTATION DATE:  01/13/2022  REFERRING MD:  Roger Shelter TRH , CHIEF COMPLAINT: Cavitation right upper lobe  History of Present Illness:  76 year old man who presented with right upper lobe mass in October 2021 and was diagnosed as stage IIIa squamous cell carcinoma.  He underwent a course of concurrent chemoradiation followed by consolidation immunotherapy. His imaging studies showed decrease in size and central cavitation of right upper lobe mass consistent with response to therapy Last CT scan 11/11/2021 showed volume loss and consolidation of the right upper lobe with peripheral cavitation and central air bronchograms with an occluded right upper lobe pulmonary artery with.  Complex cyst was noted in the right upper pole of the kidney He presented to the emergency room with fever 100.4 increased cough , saturation was 88% on room air.  Labs showed mild leukocytosis and hyponatremia  CT chest/abdomen/pelvis showed enlarging right upper lobe cavitary lesion, measuring 5.3 x 2.4 cm , right infrahilar lymph node was mildly enlarged at 1.1 cm.  There was a new 2.8 x 3.1 cm mass in the lower pole of the left kidney  Pertinent  Medical History  Stage IIIA (T4, N0, M0) non-small cell lung cancer, squamous cell carcinoma presented with large right upper lobe lung mass diagnosed in October 2021.   PRIOR THERAPY:  1) Concurrent chemoradiation with weekly carboplatin for AUC of 2 and paclitaxel 45 mg/M2. Status post 6 cycles. First dose on 02/27/20.  Last dose was given April 02, 2020. 2) Consolidation treatment with immunotherapy with Imfinzi 1500 mg IV every 4 weeks.  First dose May 10, 2020.  Status post 13 cycles.  -Diabetes type 2 -Hypertension BPH    Significant Hospital Events: Including procedures, antibiotic start and stop dates in addition to other pertinent events     Interim  History / Subjective:  Complains of fever and cough. Tmax of 100  Objective   Blood pressure 124/76, pulse 83, temperature 98.2 F (36.8 C), temperature source Oral, resp. rate (!) 22, height 5\' 9"  (1.753 m), weight 120.2 kg, SpO2 96 %.        Intake/Output Summary (Last 24 hours) at 01/13/2022 1349 Last data filed at 01/13/2022 1941 Gross per 24 hour  Intake 1100 ml  Output 25 ml  Net 1075 ml   Filed Weights   01/13/22 0531  Weight: 120.2 kg    Examination: General: Obese man, lying supine in ED stretcher, no distress HENT: No pallor, icterus, JVD or lymphadenopathy Lungs: Faint scattered rhonchi, no accessory muscle use Cardiovascular: S1-S2 distant, regular, no murmur Abdomen: Soft obese, nontender, no guarding Extremities: No deformity, no edema Neuro: Alert, interactive, nonfocal  Labs show WBC 13.1, sodium 131, mild hypokalemia Chest x-ray dependently reviewed shows masslike opacity in right mid to upper lung  Resolved Hospital Problem list     Assessment & Plan:  Stage III squamous cell right upper lobe cancer with cavitation status post chemoradiation and now fever, cough suggesting superinfection in this cavity with increased cavitation  -Would treat as pneumonia/lung abscess -Continue ceftriaxone -Vancomycin can be discontinued if MRSA PCR negative -Check sputum culture and urine strep antigen for completion -We will treat for prolonged duration 10 to 14 days, if cultures are negative can use Levaquin/Augmentin to complete the course followed by repeat imaging   ?  Underlying COPD -PFTs 01/2020 show moderate restriction with decreased DLCO -Use DuoNebs during hospital stay  PCCM  will be available as needed Best Practice (right click and "Reselect all SmartList Selections" daily)    Code Status:  full code Last date of multidisciplinary goals of care discussion [NA]  Labs   CBC: Recent Labs  Lab 01/12/22 0638 01/13/22 0808 01/13/22 1229  WBC  12.1* 13.1* 11.5*  NEUTROABS 10.1* 10.7*  --   HGB 13.1 12.3* 11.2*  HCT 40.2 37.8* 34.7*  MCV 81.0 81.1 81.3  PLT 270 261 433    Basic Metabolic Panel: Recent Labs  Lab 01/12/22 0638 01/13/22 0808 01/13/22 1229  NA 132* 131*  --   K 4.1 3.6  --   CL 94* 96*  --   CO2 27 28  --   GLUCOSE 185* 190*  --   BUN 12 12  --   CREATININE 0.88 0.88 0.74  CALCIUM 8.8* 8.4*  --   MG  --   --  2.0  PHOS  --   --  3.0   GFR: Estimated Creatinine Clearance: 102.1 mL/min (by C-G formula based on SCr of 0.74 mg/dL). Recent Labs  Lab 01/12/22 0638 01/12/22 0815 01/13/22 0808 01/13/22 1229  WBC 12.1*  --  13.1* 11.5*  LATICACIDVEN 1.1 0.9 1.0  --     Liver Function Tests: Recent Labs  Lab 01/13/22 0808  AST 38  ALT 52*  ALKPHOS 131*  BILITOT 1.6*  PROT 7.9  ALBUMIN 2.9*   No results for input(s): "LIPASE", "AMYLASE" in the last 168 hours. No results for input(s): "AMMONIA" in the last 168 hours.  ABG    Component Value Date/Time   HCO3 29.8 (H) 01/13/2022 0906   O2SAT 75.3 01/13/2022 0906     Coagulation Profile: Recent Labs  Lab 01/13/22 1229  INR 1.4*    Cardiac Enzymes: No results for input(s): "CKTOTAL", "CKMB", "CKMBINDEX", "TROPONINI" in the last 168 hours.  HbA1C: Hgb A1c MFr Bld  Date/Time Value Ref Range Status  01/27/2020 09:13 AM 6.7 (H) 4.8 - 5.6 % Final    Comment:    (NOTE) Pre diabetes:          5.7%-6.4%  Diabetes:              >6.4%  Glycemic control for   <7.0% adults with diabetes     CBG: No results for input(s): "GLUCAP" in the last 168 hours.  Review of Systems:   Fever Cough with green sputum Shortness of breath  Constitutional: negative for anorexia, fevers and sweats  Eyes: negative for irritation, redness and visual disturbance  Ears, nose, mouth, throat, and face: negative for earaches, epistaxis, nasal congestion and sore throat  Cardiovascular: negative for chest pain, lower extremity edema, orthopnea,  palpitations and syncope  Gastrointestinal: negative for abdominal pain, constipation, diarrhea, melena, nausea and vomiting  Genitourinary:negative for dysuria, frequency and hematuria  Hematologic/lymphatic: negative for bleeding, easy bruising and lymphadenopathy  Musculoskeletal:negative for arthralgias, muscle weakness and stiff joints  Neurological: negative for coordination problems, gait problems, headaches and weakness  Endocrine: negative for diabetic symptoms including polydipsia, polyuria and weight loss   Past Medical History:  He,  has a past medical history of Arthritis, Depressive disorder, Diabetes mellitus without complication ([place]), Fatigue, Headache(784.0), Hyperlipidemia, Hypertension, Hypothyroidism, and Snoring.   Surgical History:   Past Surgical History:  Procedure Laterality Date   COLONOSCOPY N/A 03/08/2013   Procedure: COLONOSCOPY;  Surgeon: Jamesetta So, MD;  [facility];  Service: Gastroenterology;  Laterality: N/A;   IR IMAGING GUIDED PORT INSERTION  03/08/2020   IR US GUIDE BX ASP/DRAIN  03/08/2020   KNEE ARTHROSCOPY Right    2006 By Dr. Luna Glasgow at [facility].    THYROID SURGERY     VIDEO BRONCHOSCOPY WITH ENDOBRONCHIAL ULTRASOUND N/A 01/30/2020   Procedure: VIDEO BRONCHOSCOPY WITH ENDOBRONCHIAL ULTRASOUND;  Surgeon: Melrose Nakayama, MD;  Location: [facility];  Service: Thoracic;  Laterality: N/A;     Social History:   reports that he has quit smoking. He has never used smokeless tobacco. He reports that he does not currently use alcohol. He reports that he does not use drugs.   Family History:  His family history includes Alzheimer's disease in his mother.   Allergies Allergies  Allergen Reactions   Ace Inhibitors Swelling and Rash     Home Medications  Prior to Admission medications   Medication Sig Start Date End Date Taking? Authorizing Provider  acetaminophen (TYLENOL) 650 MG CR tablet Take 650 mg by mouth every 8 (eight) hours as  needed for pain.   Yes [provider]  aspirin EC 81 MG tablet Take 81 mg by mouth daily.   Yes [provider]  cholecalciferol (VITAMIN D3) 25 MCG (1000 UNIT) tablet Take 1,000 Units by mouth daily.   Yes [provider]  levothyroxine (SYNTHROID) 100 MCG tablet Take 100 mcg by mouth daily. 05/03/21  Yes [provider]  losartan (COZAAR) 50 MG tablet Take 50 mg by mouth daily.    Yes [provider]  metFORMIN (GLUCOPHAGE) 500 MG tablet Take 500 mg by mouth 2 (two) times daily with a meal.    Yes [provider]  sildenafil (REVATIO) 20 MG tablet Take 20 mg by mouth daily as needed (ED).    Yes [provider]  simvastatin (ZOCOR) 10 MG tablet Take 10 mg by mouth every evening.    Yes [provider]  tamsulosin (FLOMAX) 0.4 MG CAPS capsule Take 0.4 mg by mouth daily.   Yes [provider]  [facility] ULTRA test strip 1 each 2 (two) times daily. 03/03/20   [provider]  sulfamethoxazole-trimethoprim (BACTRIM DS) 800-160 MG tablet Take 1 tablet by mouth 2 (two) times daily. Patient not taking: Reported on 12/12/2021 11/28/21   [place], Reece Leader., MD       Kara Mead MD. FCCP. [facility] Pager : [phone]  If no response to pager , please call [phone] until 7 pm After 7:00 pm call Elink  [phone]   01/13/2022

## 2022-01-13 NOTE — ED Provider Notes (Signed)
[facility] EMERGENCY DEPARTMENT Provider Note   CSN: [id number] Arrival date & time: 01/13/22  9678     History  Chief Complaint  Patient presents with   Fever    Billy Hall is a 76 y.o. male.  Patient is a 76 year old male with past medical history of lung cancer, BPH, hyperlipidemia, hypothyroidism, hypertension.  Patient presenting today with complaints of fever.  Patient states that he woke up this morning feeling unwell.  He took his temperature and it was 100.4, so he decided to present to the emergency department for this.  Patient describes urinary frequency, but was seen yesterday for that complaint and no evidence for UTI was found.  Patient also describes having had a cough persistently for the past 2 years since having COVID.  He feels somewhat short of breath, but denies any chest pains.  He denies any ill contacts.  He has not taken any medication for the fever, but arrives here afebrile with temperature of 98.9.  The history is provided by the patient.       Home Medications Prior to Admission medications   Medication Sig Start Date End Date Taking? Authorizing Provider  acetaminophen (TYLENOL) 650 MG CR tablet Take 650 mg by mouth every 8 (eight) hours as needed for pain.    [provider]  aspirin EC 81 MG tablet Take 81 mg by mouth daily.    [provider]  cholecalciferol (VITAMIN D3) 25 MCG (1000 UNIT) tablet Take 1,000 Units by mouth daily.    [provider]  levothyroxine (SYNTHROID) 100 MCG tablet Take 100 mcg by mouth daily. 05/03/21   [provider]  losartan (COZAAR) 50 MG tablet Take 50 mg by mouth daily.     [provider]  metFORMIN (GLUCOPHAGE) 500 MG tablet Take 500 mg by mouth 2 (two) times daily with a meal.     [provider]  ONETOUCH ULTRA test strip 1 each 2 (two) times daily. 03/03/20   [provider]  sildenafil (REVATIO) 20 MG tablet Take 20 mg by mouth daily as  needed (ED).     [provider]  simvastatin (ZOCOR) 10 MG tablet Take 10 mg by mouth every evening.     [provider]  sulfamethoxazole-trimethoprim (BACTRIM DS) 800-160 MG tablet Take 1 tablet by mouth 2 (two) times daily. Patient not taking: Reported on 12/12/2021 11/28/21   Primus Bravo., MD  tamsulosin (FLOMAX) 0.4 MG CAPS capsule Take 0.4 mg by mouth daily.    [provider]      Allergies    Ace inhibitors    Review of Systems   Review of Systems  All other systems reviewed and are negative.   Physical Exam Updated Vital Signs BP 106/88 (BP Location: Right Arm)   Pulse (!) 112   Temp 98.9 F (37.2 C) (Oral)   Resp (!) 24   Ht 5\' 9"  (1.753 m)   Wt 120.2 kg   SpO2 94%   BMI 39.13 kg/m  Physical Exam Vitals and nursing note reviewed.  Constitutional:      General: He is not in acute distress.    Appearance: He is well-developed. He is not diaphoretic.  HENT:     Head: Normocephalic and atraumatic.  Cardiovascular:     Rate and Rhythm: Normal rate and regular rhythm.     Heart sounds: No murmur heard.    No friction rub.  Pulmonary:     Effort: Pulmonary effort  is normal. No respiratory distress.     Breath sounds: Normal breath sounds. No wheezing or rales.  Abdominal:     General: Bowel sounds are normal. There is no distension.     Palpations: Abdomen is soft.     Tenderness: There is no abdominal tenderness.  Musculoskeletal:        General: Normal range of motion.     Cervical back: Normal range of motion and neck supple.  Skin:    General: Skin is warm and dry.  Neurological:     Mental Status: He is alert and oriented to person, place, and time.     Coordination: Coordination normal.     ED Results / Procedures / Treatments   Labs (all labs ordered are listed, but only abnormal results are displayed) Labs Reviewed - No data to display  EKG None  Radiology No results found.  Procedures Procedures   {Document cardiac monitor, telemetry assessment procedure when appropriate:1}  Medications Ordered in ED Medications - No data to display  ED Course/ Medical Decision Making/ A&P                           Medical Decision Making Amount and/or Complexity of Data Reviewed Labs: ordered. Radiology: ordered.   ***  {Document critical care time when appropriate:1} {Document review of labs and clinical decision tools ie heart score, Chads2Vasc2 etc:1}  {Document your independent review of radiology images, and any outside records:1} {Document your discussion with family members, caretakers, and with consultants:1} {Document social determinants of health affecting pt's care:1} {Document your decision making why or why not admission, treatments were needed:1} Final Clinical Impression(s) / ED Diagnoses Final diagnoses:  None    Rx / DC Orders ED Discharge Orders     None

## 2022-01-13 NOTE — TOC Initial Note (Signed)
Transition of Care [facility]) - Initial/Assessment Note    Patient Details  Name: Billy Hall MRN: [id number] Date of Birth: February 13, 1946  Transition of [facility]) CM/SW Contact:    Iona Beard, [place] Phone Number: 01/13/2022, 2:25 PM  Clinical Narrative:                 [facility] consulted for HH/DME/PCP needs. CSW completed chart review and found that pt has PCP currently. CSW spoke with pt to complete assessment. Pt lives alone. Pt is independent in completing ADLs and drives when needed. Pt has not had HH and does not use any DME. TOC to follow.   Expected Discharge Plan: Home/Self Care Barriers to Discharge: Continued Medical Work up   Patient Goals and CMS Choice Patient states their goals for this hospitalization and ongoing recovery are:: return home CMS Medicare.gov Compare Post Acute Care list provided to:: Patient Choice offered to / list presented to : Patient  Expected Discharge Plan and Services Expected Discharge Plan: Home/Self Care In-house Referral: Clinical Social Work Discharge Planning Services: CM Consult   Living arrangements for the past 2 months: Single Family Home                                      Prior Living Arrangements/Services Living arrangements for the past 2 months: Single Family Home Lives with:: Self Patient language and need for interpreter reviewed:: Yes Do you feel safe going back to the place where you live?: Yes      Need for Family Participation in Patient Care: Yes (Comment) Care giver support system in place?: Yes (comment)   Criminal Activity/Legal Involvement Pertinent to Current Situation/Hospitalization: No - Comment as needed  Activities of Daily Living      Permission Sought/Granted                  Emotional Assessment Appearance:: Appears stated age Attitude/Demeanor/Rapport: Engaged Affect (typically observed): Accepting Orientation: : Oriented to Self, Oriented to  Time, Oriented to Place,  Oriented to Situation Alcohol / Substance Use: Not Applicable Psych Involvement: No (comment)  Admission diagnosis:  Hypoxia [R09.02] Patient Active Problem List   Diagnosis Date Noted   Hypoxia 01/13/2022   Sepsis ([place]) 01/13/2022    Class: Acute   Essential hypertension 01/13/2022    Class: Acute   Hyperlipidemia 01/13/2022   Hypothyroidism 01/13/2022   DM II (diabetes mellitus, type II), controlled ([place]) 01/13/2022   Left renal mass 01/13/2022   Penile lesion 01/14/2021   BPH with obstruction/lower urinary tract symptoms 01/14/2021   Encounter for antineoplastic immunotherapy 05/03/2020   Port-A-Cath in place 05/01/2020   Encounter for antineoplastic chemotherapy 02/16/2020   Malignant neoplasm of upper lobe of right lung ([place]) 01/26/2020   PCP:  [name], MD Pharmacy:   [facility], [address] [place] [address] Phone: [phone] Fax: [phone]     Social Determinants of Health (SDOH) Interventions    Readmission Risk Interventions     No data to display

## 2022-01-13 NOTE — Assessment & Plan Note (Addendum)
-   Resolved, satting 97% on room air now  - Per nursing staff with ambulation desatted to 88% -Continue oxygen as needed to maintain O2 sat greater 94%  - As needed DuoNeb bronchodilators - Initiating small dose of IV steroids--reducing dose from 80 to 40 mg daily may be discontinued in a.m.

## 2022-01-13 NOTE — ED Notes (Signed)
To CT, no changes, alert, NAD, calm, interactive

## 2022-01-13 NOTE — Assessment & Plan Note (Signed)
-   Monitoring for possible hypertension due to sepsis, -Otherwise if remains stable, will resume losartan -As needed IV hydralazine

## 2022-01-13 NOTE — Hospital Course (Addendum)
Billy Hall 76 year old male with extensive history of DM 2, HTN, HLD, hypothyroidism, BPH, and h/o non-small cell cancer who is in remission now presenting with fever, increased cough, tachycardia, tachypnea, satting 88% on room air with exertion. Apparently had a fever 100.4 at home, symptoms has been progressively getting worse over the last 2 days.  Reports he has been having cough for past 2 years since he had COVID but now is progressively worse associated with shortness of breath.  ED course: Upon arrival patient was tachypneic, tachycardic, satting 88% with ambulation with Tmax of 100.0, at home per patient 100.4 Currently: Blood pressure 126/76, pulse 84, temperature 98.2 F (36.8 C), temperature source Oral, resp. rate (!) 30, height '5\' 9"'  (1.753 m), weight 120.2 kg, SpO2 94 % addressed Nursing staff 88% with walking    Latest Ref Rng & Units 01/13/2022   12:29 PM 01/13/2022    8:08 AM 01/12/2022    6:38 AM  CBC  WBC 4.0 - 10.5 K/uL 11.5  13.1  12.1   Hemoglobin 13.0 - 17.0 g/dL 11.2  12.3  13.1   Hematocrit 39.0 - 52.0 % 34.7  37.8  40.2   Platelets 150 - 400 K/uL 237  261  270    CMP sodium 131, potassium 3.6, glucose 190, BUN 12, creatinine 0.88, alk phos 131, albumin 2.9, UA negative for leukocyte esterase or nitrites, WBC 0-5  Lactic acid 0.9, 1.0,  CTA: Right upper lobe finding ReallyCalled the enlarged cavitary right upper lobe lesion the patient was previously treated lung CA, in setting of symptoms consistent with possible pneumonia, local tumor recurrence cannot be excluded, right hilar lymphadenopathy, left kidney pole heterogeneous lesion suspicious of neoplasm, colonic diverticulosis Follow-up with oncologist PET scan may be necessary to rule out right upper lobe lesion, left renal findings.   Blood cultures been obtained   Based on current finding and history of lung cancer patient was requested to be admitted for pneumonia-sepsis-hypoxia

## 2022-01-13 NOTE — Assessment & Plan Note (Signed)
Reoccurring right upper lobe lung lesion, left renal Pole lesion  For CT findings: CTA: Right upper lobe finding ReallyCalled the enlarged cavitary right upper lobe lesion the patient was previously treated lung CA, in setting of symptoms consistent with possible pneumonia, local tumor recurrence cannot be excluded, right hilar lymphadenopathy, left kidney pole heterogeneous lesion suspicious of neoplasm, colonic diverticulosis Follow-up with oncologist PET scan may be necessary to rule out right upper lobe lesion, left renal findings.  -Would recommend follow-up with primary oncologist for evaluation -Discussed with Dr. Julien Nordmann

## 2022-01-13 NOTE — ED Notes (Signed)
Back from CT, no changes, ambulatory to b/r.

## 2022-01-13 NOTE — ED Notes (Signed)
Lab at [facility] for 2nd BCx

## 2022-01-13 NOTE — Assessment & Plan Note (Signed)
-   Continue Flomax 

## 2022-01-13 NOTE — H&P (Signed)
History and Physical   Patient: Billy Hall                            PCP: [name], MD                    DOB: 08-31-1945            DOA: 01/13/2022 VOH:[id number]             DOS: 01/13/2022, 12:50 PM  [name], MD  Patient coming from:   HOME  I have personally reviewed patient's medical records, in electronic medical records, including:  [facility], and care everywhere.    Chief Complaint:   Chief Complaint  Patient presents with   Fever    History of present illness:    Billy Hall 76 year old male with extensive history of DM 2, HTN, HLD, hypothyroidism, BPH, and h/o non-small cell cancer who is in remission now presenting with fever, increased cough, tachycardia, tachypnea, satting 88% on room air with exertion. Apparently had a fever 100.4 at home, symptoms has been progressively getting worse over the last 2 days.  Reports he has been having cough for past 2 years since he had COVID but now is progressively worse associated with shortness of breath.  ED course: Upon arrival patient was tachypneic, tachycardic, satting 88% with ambulation with Tmax of 100.0, at home per patient 100.4 Currently: Blood pressure 126/76, pulse 84, temperature 98.2 F (36.8 C), temperature source Oral, resp. rate (!) 30, height _0  (1.753 m), weight 120.2 kg, SpO2 94 % addressed Nursing staff 88% with walking    Latest Ref Rng & Units 01/13/2022   12:29 PM 01/13/2022    8:08 AM 01/12/2022    6:38 AM  CBC  WBC 4.0 - 10.5 K/uL 11.5  13.1  12.1   Hemoglobin 13.0 - 17.0 g/dL 11.2  12.3  13.1   Hematocrit 39.0 - 52.0 % 34.7  37.8  40.2   Platelets 150 - 400 K/uL 237  261  270    CMP sodium 131, potassium 3.6, glucose 190, BUN 12, creatinine 0.88, alk phos 131, albumin 2.9, UA negative for leukocyte esterase or nitrites, WBC 0-5  Lactic acid 0.9, 1.0,  CTA: Right upper lobe finding ReallyCalled the enlarged cavitary right upper lobe lesion the patient was  previously treated lung CA, in setting of symptoms consistent with possible pneumonia, local tumor recurrence cannot be excluded, right hilar lymphadenopathy, left kidney pole heterogeneous lesion suspicious of neoplasm, colonic diverticulosis Follow-up with oncologist PET scan may be necessary to rule out right upper lobe lesion, left renal findings.   Blood cultures been obtained   Based on current finding and history of lung cancer patient was requested to be admitted for pneumonia-sepsis-hypoxia      Patient Denies having: Fever, Chills, Cough, SOB, Chest Pain, Abd pain, N/V/D, headache, dizziness, lightheadedness,  Dysuria, Joint pain, rash, open wounds  ED Course:   Blood pressure 126/76, pulse 84, temperature 98.2 F (36.8 C), temperature source Oral, resp. rate (!) 30, height _1  (1.753 m), weight 120.2 kg, SpO2 94 %. Abnormal labs;   Review of Systems: As per HPI, otherwise 10 point review of systems were negative.   ----------------------------------------------------------------------------------------------------------------------  Allergies  Allergen Reactions   Ace Inhibitors Swelling and Rash    Home MEDs:  Prior to Admission medications   Medication Sig Start Date End Date Taking? Authorizing Provider  acetaminophen (TYLENOL) 650 MG CR tablet Take 650 mg by mouth every 8 (eight) hours as needed for pain.   Yes [provider]  aspirin EC 81 MG tablet Take 81 mg by mouth daily.   Yes [provider]  cholecalciferol (VITAMIN D3) 25 MCG (1000 UNIT) tablet Take 1,000 Units by mouth daily.   Yes [provider]  levothyroxine (SYNTHROID) 100 MCG tablet Take 100 mcg by mouth daily. 05/03/21  Yes [provider]  losartan (COZAAR) 50 MG tablet Take 50 mg by mouth daily.    Yes [provider]  metFORMIN (GLUCOPHAGE) 500 MG tablet Take 500 mg by mouth 2 (two) times daily with a meal.    Yes [provider]   sildenafil (REVATIO) 20 MG tablet Take 20 mg by mouth daily as needed (ED).    Yes [provider]  simvastatin (ZOCOR) 10 MG tablet Take 10 mg by mouth every evening.    Yes [provider]  tamsulosin (FLOMAX) 0.4 MG CAPS capsule Take 0.4 mg by mouth daily.   Yes [provider]  [facility] ULTRA test strip 1 each 2 (two) times daily. 03/03/20   [provider]  sulfamethoxazole-trimethoprim (BACTRIM DS) 800-160 MG tablet Take 1 tablet by mouth 2 (two) times daily. Patient not taking: Reported on 12/12/2021 11/28/21   Primus Bravo., MD    PRN MEDs: sodium chloride, acetaminophen **OR** acetaminophen, bisacodyl, hydrALAZINE, HYDROmorphone (DILAUDID) injection, ipratropium, levalbuterol, ondansetron **OR** ondansetron (ZOFRAN) IV, oxyCODONE, senna-docusate, sodium chloride flush, sodium phosphate, traZODone  Past Medical History:  Diagnosis Date   Arthritis    Depressive disorder    Diabetes mellitus without complication (HCC)    Fatigue    Headache(784.0)    Hyperlipidemia    Hypertension    Hypothyroidism    Snoring     Past Surgical History:  Procedure Laterality Date   COLONOSCOPY N/A 03/08/2013   Procedure: COLONOSCOPY;  Surgeon: Jamesetta So, MD;  [facility];  Service: Gastroenterology;  Laterality: N/A;   IR IMAGING GUIDED PORT INSERTION  03/08/2020   IR US GUIDE BX ASP/DRAIN  03/08/2020   KNEE ARTHROSCOPY Right    2006 By Dr. Luna Glasgow at [facility]    THYROID SURGERY     VIDEO BRONCHOSCOPY WITH ENDOBRONCHIAL ULTRASOUND N/A 01/30/2020   Procedure: VIDEO BRONCHOSCOPY WITH ENDOBRONCHIAL ULTRASOUND;  Surgeon: Melrose Nakayama, MD;  Location: [place];  Service: Thoracic;  Laterality: N/A;     reports that he has quit smoking. He has never used smokeless tobacco. He reports that he does not currently use alcohol. He reports that he does not use drugs.   Family History  Problem Relation Age of Onset   Alzheimer's disease  Mother     Physical Exam:   Vitals:   01/13/22 1020 01/13/22 1030 01/13/22 1035 01/13/22 1036  BP:   126/76   Pulse: 89 88  84  Resp: (!) 26 (!) 25  (!) 30  Temp:   98.2 F (36.8 C)   TempSrc:   Oral   SpO2: 92% 92%  94%  Weight:      Height:       Constitutional: Shortness of breath, cough with ambulation NAD, calm, comfortable Eyes: PERRL, lids and conjunctivae normal ENMT: Mucous membranes are moist. Posterior pharynx clear of any exudate or lesions.Normal dentition.  Neck: normal, supple, no masses, no thyromegaly Respiratory: clear to auscultation bilaterally, no wheezing, no crackles. Normal respiratory effort. No accessory muscle use.  Cardiovascular:  Regular rate and rhythm, no murmurs / rubs / gallops. No extremity edema. 2+ pedal pulses. No carotid bruits.  Abdomen: no tenderness, no masses palpated. No hepatosplenomegaly. Bowel sounds positive.  Musculoskeletal: no clubbing / cyanosis. No joint deformity upper and lower extremities. Good ROM, no contractures. Normal muscle tone.  Neurologic: CN II-XII grossly intact. Sensation intact, DTR normal. Strength 5/5 in all 4.  Psychiatric: Normal judgment and insight. Alert and oriented x 3. Normal mood.  Skin: no rashes, lesions, ulcers. No induration Decubitus/ulcers:  Wounds: per nursing documentation         Labs on admission:    I have personally reviewed following labs and imaging studies  CBC: Recent Labs  Lab 01/12/22 0638 01/13/22 0808 01/13/22 1229  WBC 12.1* 13.1* 11.5*  NEUTROABS 10.1* 10.7*  --   HGB 13.1 12.3* 11.2*  HCT 40.2 37.8* 34.7*  MCV 81.0 81.1 81.3  PLT 270 261 338   Basic Metabolic Panel: Recent Labs  Lab 01/12/22 0638 01/13/22 0808  NA 132* 131*  K 4.1 3.6  CL 94* 96*  CO2 27 28  GLUCOSE 185* 190*  BUN 12 12  CREATININE 0.88 0.88  CALCIUM 8.8* 8.4*   GFR: Estimated Creatinine Clearance: 92.8 mL/min (by C-G formula based on SCr of 0.88 mg/dL). Liver Function  Tests: Recent Labs  Lab 01/13/22 0808  AST 38  ALT 52*  ALKPHOS 131*  BILITOT 1.6*  PROT 7.9  ALBUMIN 2.9*    Urine analysis:    Component Value Date/Time   COLORURINE AMBER (A) 01/13/2022 0728   APPEARANCEUR CLEAR 01/13/2022 0728   APPEARANCEUR Clear 12/12/2021 1446   LABSPEC 1.020 01/13/2022 0728   PHURINE 6.0 01/13/2022 0728   GLUCOSEU NEGATIVE 01/13/2022 0728   HGBUR MODERATE (A) 01/13/2022 0728   BILIRUBINUR NEGATIVE 01/13/2022 0728   BILIRUBINUR Negative 12/12/2021 1446   KETONESUR 5 (A) 01/13/2022 0728   PROTEINUR 100 (A) 01/13/2022 0728   NITRITE NEGATIVE 01/13/2022 0728   LEUKOCYTESUR NEGATIVE 01/13/2022 0728    Last A1C:  Lab Results  Component Value Date   HGBA1C 6.7 (H) 01/27/2020     Radiologic Exams on Admission:   CT CHEST ABDOMEN PELVIS W CONTRAST  Result Date: 01/13/2022 CLINICAL DATA:  Cough with fever and urinary incontinence for 3 days. History of lung cancer. EXAM: CT CHEST, ABDOMEN, AND PELVIS WITH CONTRAST TECHNIQUE: Multidetector CT imaging of the chest, abdomen and pelvis was performed following the standard protocol during bolus administration of intravenous contrast. RADIATION DOSE REDUCTION: This exam was performed according to the departmental dose-optimization program which includes automated exposure control, adjustment of the mA and/or kV according to patient size and/or use of iterative reconstruction technique. CONTRAST:  140m OMNIPAQUE IOHEXOL 300 MG/ML  SOLN COMPARISON:  Chest radiographs 01/13/2022, chest CT 11/11/2021 and abdominopelvic CT 10/12/2021. PET-CT 02/06/2020. FINDINGS: CT CHEST FINDINGS Cardiovascular: Suboptimal contrast bolus. No acute vascular findings are identified. Right IJ Port-A-Cath extends to the superior cavoatrial junction. Mild aortic atherosclerosis and chronic central enlargement of the pulmonary arteries. The heart size is normal. There is no pericardial effusion. Mediastinum/Nodes: Interval mild enlargement of  a right infrahilar node which measures 1.1 cm short axis on image 25/2 (previously 0.8 cm). No other enlarged mediastinal, hilar or axillary lymph nodes are identified. There is scarring and distortion in the right perihilar region attributed to prior radiation therapy. The thyroid gland, trachea and esophagus demonstrate no significant findings. Lungs/Pleura: Trace right pleural effusion. No pneumothorax. Chronic volume loss in the right upper  lobe with peripheral cavitation. Compared with the most recent CT, the cavitary component has enlarged, measuring approximately 5.3 x 2.4 cm on image 14/2, and there is increased peripheral right upper lobe convexity. Otherwise stable scarring in both lungs. No suspicious nodules within the aerated portions of the lungs. Musculoskeletal/Chest wall: No chest wall mass or suspicious osseous findings. Stable right upper rib deformities. CT ABDOMEN AND PELVIS FINDINGS Hepatobiliary: The liver is normal in density without suspicious focal abnormality. No evidence of gallstones, gallbladder wall thickening or biliary dilatation. Pancreas: Unremarkable. No pancreatic ductal dilatation or surrounding inflammatory changes. Spleen: Normal in size without focal abnormality. Adrenals/Urinary Tract: Both adrenal glands appear stable. Nonobstructing calculus in the lower pole of the right kidney. No evidence of ureteral calculus or hydronephrosis. 1.8 cm exophytic cyst in the upper pole of the right kidney appears stable, not requiring any imaging follow-up. However, there is an indeterminate mass involving the lower pole of the left kidney which demonstrates heterogeneous enhancement and measures up to 3.8 x 3.1 cm on image 27/8. The bladder appears normal for its degree of distention. Stomach/Bowel: No enteric contrast administered. The stomach appears unremarkable for its degree of distension. No evidence of bowel wall thickening, distention or surrounding inflammatory change. There are  diverticular changes throughout the descending and sigmoid colon. Vascular/Lymphatic: There are no enlarged abdominal or pelvic lymph nodes. Aortic and branch vessel atherosclerosis without evidence of aneurysm or large vessel occlusion. Reproductive: Heterogeneous enlargement of the prostate gland. Other: Small umbilical hernia containing only fat. No ascites or peritoneal nodularity. Musculoskeletal: No acute or significant osseous findings. Multilevel lumbar spondylosis. IMPRESSION: 1. Enlarging cavitary right upper lobe lesion in this patient previously treated for lung cancer. In the setting of fever and cough, this could reflect superimposed infection. Local tumor recurrence not excluded. An adjacent prominent right infrahilar lymph node is nonspecific and could be reactive. 2. No evidence of acute inflammation in the abdomen or pelvis. 3. Heterogeneous mass involving the lower pole of the left kidney which is suspicious for neoplasm (renal cell carcinoma versus metastasis). Comparison imaging is limited to noncontrast images which limits assessment for stability. 4. No other evidence of metastatic disease. 5. Distal colonic diverticulosis. 6. PET-CT follow-up may be helpful to address the right upper lobe pulmonary and left renal findings. Alternatively, the left renal lesion could be further evaluated with dedicated renal MRI (without and with contrast). Electronically Signed   By: Richardean Sale M.D.   On: 01/13/2022 11:40   DG Chest 2 View  Result Date: 01/13/2022 CLINICAL DATA:  76 year old male with history of fever and cough. EXAM: CHEST - 2 VIEW COMPARISON:  Chest x-ray 01/27/2020. FINDINGS: Right internal jugular single-lumen power porta cath with tip terminating in the distal superior vena cava. Increasing mass-like opacity in the right mid to upper lung, similar to recent chest CT, likely chronic postradiation mass-like fibrosis. Left lung is clear. No pleural effusions. Mild elevation of the  right hemidiaphragm is unchanged. No pneumothorax. No evidence of pulmonary edema. Heart size is normal. IMPRESSION: 1. Radiographic appearance the chest is similar to prior studies with apparent postradiation mass-like fibrosis throughout the right mid to upper lung. No definite radiographic evidence of acute cardiopulmonary disease. Electronically Signed   By: Vinnie Langton M.D.   On: 01/13/2022 06:16    EKG:   Independently reviewed.  Orders placed or performed during the hospital encounter of 01/13/22   EKG 12-Lead   ---------------------------------------------------------------------------------------------------------------------------------------    Assessment / Hall:   Principal  Problem:   Sepsis ([place]) Active Problems:   Hypoxia   Essential hypertension   DM II (diabetes mellitus, type II), controlled ([place])   Left renal mass   BPH with obstruction/lower urinary tract symptoms   Hyperlipidemia   Hypothyroidism   Malignant neoplasm of upper lobe of right lung (HCC)   Assessment and Hall: * Sepsis ([place])  - On arrival: This may need some Tmax 100, PR 10 1101-112, RR 22-34, BP as low as 106/88,   Per nursing staff with ambulation desatted to 88% -Currently addressed in bed satting 91-94%  -Continue oxygen as needed to maintain O2 sat > 92% O2 sat 91% on room air  - improved vitals: 126/76, pulse 84, temperature 98.2 F (36.8 C), temperature source Oral, resp. rate (!) 30,weight  SpO2 94 %. -WBC 13.1, mildly hypoxic evaluated, lactic acid 0.0, -SARS-CoV-2 negative  -Imaging CT of the chest may be consistent with right upper lobe opacity consistent with pneumonia -in the setting of previous cancer site    -Initiate incentive protocol, IV fluid hydration, broad-spectrum antibiotics cefepime and vancomycin has been initiated  -We will follow-up with blood cultures according   Hypoxia - Per nursing staff with ambulation desatted to 88% -Currently addressed in  bed satting 91-94%  -Continue oxygen as needed to maintain O2 sat greater 94%  - As needed DuoNeb bronchodilators - Initiating small dose of IV steroids  Left renal mass Reoccurring right upper lobe lung lesion, left renal Pole lesion  For CT findings: CTA: Right upper lobe finding ReallyCalled the enlarged cavitary right upper lobe lesion the patient was previously treated lung CA, in setting of symptoms consistent with possible pneumonia, local tumor recurrence cannot be excluded, right hilar lymphadenopathy, left kidney pole heterogeneous lesion suspicious of neoplasm, colonic diverticulosis Follow-up with oncologist PET scan may be necessary to rule out right upper lobe lesion, left renal findings.  -Would recommend follow-up with primary oncologist for evaluation  DM II (diabetes mellitus, type II), controlled ([place]) - Holding home occasional Glucophage -Checking CBG QA CHS with SSI coverage -A1c: -Last A1c 01/27/2020 6.7  Essential hypertension - Monitoring for possible hypertension due to sepsis, -Otherwise if remains stable, will resume losartan -As needed IV hydralazine  Hypothyroidism - Continue home dose Synthroid  Hyperlipidemia - Continue home medication of simvastatin  BPH with obstruction/lower urinary tract symptoms - Continue Flomax   Malignant neoplasm of upper lobe of right lung (HCC) - In remission -  patient is to follow-up with a primary oncologist regarding new lesions kidney, and reimaging of right upperlung lesion    -CTA angiogram revealing route upper lobe cavitary lesion consistent with infiltrate versus previous or recurrent neoplasm -We will have the patient follow-up with a primary oncologist for evaluation    Consults called: Pulmonologist -------------------------------------------------------------------------------------------------------------------------------------------- DVT prophylaxis:  heparin injection 5,000 Units Start:  01/13/22 1400 TED hose Start: 01/13/22 1211 SCDs Start: 01/13/22 1211   Code Status:   Code Status: Full Code   Admission status: Patient will be admitted as Inpatient, with a greater than 2 midnight length of stay. Level of care: Telemetry   Family Communication:  none at bedside  (The above findings and Hall of care has been discussed with patient in detail, the patient expressed understanding and agreement of above Hall)  --------------------------------------------------------------------------------------------------------------------------------------------------  Disposition Hall:  Anticipated 1-2 days Status is: Inpatient Remains inpatient appropriate because: Needing treatment for sepsis pneumonia, hypoxia, IV antibiotics, IV fluids     ----------------------------------------------------------------------------------------------------------------------------------------------------  Time spent: > than  75  Min.   SIGNED: Deatra James, MD, FHM. [facility],  Pager (Please use [email or web address] to page to text)  If 7PM-7AM, please contact night-coverage [email or web address],  01/13/2022, 12:50 PM   Author: Deatra James, MD 01/13/2022 12:50 PM  For on call review [email or web address].

## 2022-01-14 DIAGNOSIS — E039 Hypothyroidism, unspecified: Secondary | ICD-10-CM | POA: Diagnosis not present

## 2022-01-14 DIAGNOSIS — N401 Enlarged prostate with lower urinary tract symptoms: Secondary | ICD-10-CM | POA: Diagnosis not present

## 2022-01-14 DIAGNOSIS — A419 Sepsis, unspecified organism: Secondary | ICD-10-CM | POA: Diagnosis not present

## 2022-01-14 DIAGNOSIS — E1169 Type 2 diabetes mellitus with other specified complication: Secondary | ICD-10-CM | POA: Diagnosis not present

## 2022-01-14 LAB — BASIC METABOLIC PANEL
Anion gap: 5 (ref 5–15)
BUN: 13 mg/dL (ref 8–23)
CO2: 26 mmol/L (ref 22–32)
Calcium: 8.2 mg/dL — ABNORMAL LOW (ref 8.9–10.3)
Chloride: 104 mmol/L (ref 98–111)
Creatinine, Ser: 0.76 mg/dL (ref 0.61–1.24)
GFR, Estimated: >60 mL/min (ref >60)
Glucose, Bld: 248 mg/dL — ABNORMAL HIGH (ref 70–99)
Potassium: 3.8 mmol/L (ref 3.5–5.1)
Sodium: 135 mmol/L (ref 135–145)

## 2022-01-14 LAB — CBC
HCT: 32.7 % — ABNORMAL LOW (ref 39.0–52.0)
Hemoglobin: 10.5 g/dL — ABNORMAL LOW (ref 13.0–17.0)
MCH: 26.1 pg (ref 26.0–34.0)
MCHC: 32.1 g/dL (ref 30.0–36.0)
MCV: 81.3 fL (ref 80.0–100.0)
Platelets: 238 10*3/uL (ref 150–400)
RBC: 4.02 MIL/uL — ABNORMAL LOW (ref 4.22–5.81)
RDW: 15.6 % — ABNORMAL HIGH (ref 11.5–15.5)
WBC: 9.1 10*3/uL (ref 4.0–10.5)
nRBC: 0 % (ref 0.0–0.2)

## 2022-01-14 LAB — PROTIME-INR
INR: 1.2 (ref 0.8–1.2)
Prothrombin Time: 15.4 seconds — ABNORMAL HIGH (ref 11.4–15.2)

## 2022-01-14 LAB — GLUCOSE, CAPILLARY
Glucose-Capillary: 242 mg/dL — ABNORMAL HIGH (ref 70–99)
Glucose-Capillary: 224 mg/dL — ABNORMAL HIGH (ref 70–99)
Glucose-Capillary: 176 mg/dL — ABNORMAL HIGH (ref 70–99)
Glucose-Capillary: 244 mg/dL — ABNORMAL HIGH (ref 70–99)

## 2022-01-14 LAB — APTT: aPTT: 39 seconds — ABNORMAL HIGH (ref 24–36)

## 2022-01-14 MED ORDER — SODIUM CHLORIDE 0.9 % IV SOLN: INTRAVENOUS | Status: AC

## 2022-01-14 MED ORDER — CHLORHEXIDINE GLUCONATE CLOTH 2 % EX PADS
6.0000 | MEDICATED_PAD | Freq: Every day | CUTANEOUS | Status: DC
  Administered 2022-01-14 – 2022-01-15 (×2): 6 via TOPICAL

## 2022-01-14 MED ORDER — VANCOMYCIN HCL IN DEXTROSE 1-5 GM/200ML-% IV SOLN: 1000.0000 mg | Freq: Two times a day (BID) | INTRAVENOUS | Status: DC

## 2022-01-14 MED ORDER — METHYLPREDNISOLONE SODIUM SUCC 40 MG IJ SOLR
40.0000 mg | INTRAMUSCULAR | Status: DC
  Administered 2022-01-14: 40 mg via INTRAVENOUS
  Filled 2022-01-14: qty 1

## 2022-01-14 NOTE — Progress Notes (Signed)
Pt de-accessed port by accident going to the bathroom, site assessed, no bleeding, cleaned with chlorhexidine. Pt not in any pain. MD notified. 22 G in the R forearm, flushes with blood return, continued NS at 70ml/hr in this IV.

## 2022-01-14 NOTE — Progress Notes (Signed)
PROGRESS NOTE    Patient: Billy Hall                            PCP: [name], MD                    DOB: 08-20-1945            DOA: 01/13/2022 CHE:[id number]             DOS: 01/14/2022, 12:39 PM   LOS: 1 day   Date of Service: The patient was seen and examined on 01/14/2022  Subjective:   The patient was seen and examined this morning. Stable at this time. Still complaining of mild shortness of breath with exertion, with successfully been weaned off supplemental oxygen, currently satting 97% on room air Otherwise no issues overnight .  Brief Narrative:   Billy Hall 76 year old male with extensive history of DM 2, HTN, HLD, hypothyroidism, BPH, and h/o non-small cell cancer who is in remission now presenting with fever, increased cough, tachycardia, tachypnea, satting 88% on room air with exertion. Apparently had a fever 100.4 at home, symptoms has been progressively getting worse over the last 2 days.  Reports he has been having cough for past 2 years since he had COVID but now is progressively worse associated with shortness of breath.  ED course: Upon arrival patient was tachypneic, tachycardic, satting 88% with ambulation with Tmax of 100.0, at home per patient 100.4 Currently: Blood pressure 126/76, pulse 84, temperature 98.2 F (36.8 C), temperature source Oral, resp. rate (!) 30, height 5' 9" (1.753 m), weight 120.2 kg, SpO2 94 % addressed Nursing staff 88% with walking    Latest Ref Rng & Units 01/13/2022   12:29 PM 01/13/2022    8:08 AM 01/12/2022    6:38 AM  CBC  WBC 4.0 - 10.5 K/uL 11.5  13.1  12.1   Hemoglobin 13.0 - 17.0 g/dL 11.2  12.3  13.1   Hematocrit 39.0 - 52.0 % 34.7  37.8  40.2   Platelets 150 - 400 K/uL 237  261  270    CMP sodium 131, potassium 3.6, glucose 190, BUN 12, creatinine 0.88, alk phos 131, albumin 2.9, UA negative for leukocyte esterase or nitrites, WBC 0-5  Lactic acid 0.9, 1.0,  CTA: Right upper lobe  finding ReallyCalled the enlarged cavitary right upper lobe lesion the patient was previously treated lung CA, in setting of symptoms consistent with possible pneumonia, local tumor recurrence cannot be excluded, right hilar lymphadenopathy, left kidney pole heterogeneous lesion suspicious of neoplasm, colonic diverticulosis Follow-up with oncologist PET scan may be necessary to rule out right upper lobe lesion, left renal findings.   Blood cultures been obtained   Based on current finding and history of lung cancer patient was requested to be admitted for pneumonia-sepsis-hypoxia      Assessment & Hall:   Principal Problem:   Sepsis ([place]) Active Problems:   Hypoxia   Essential hypertension   DM II (diabetes mellitus, type II), controlled ([place])   Left renal mass   BPH with obstruction/lower urinary tract symptoms   Hyperlipidemia   Hypothyroidism   Malignant neoplasm of upper lobe of right lung (HCC)     Assessment and Hall: * Sepsis ([place]) - Much improved sepsis physiology -Weaned supplemental oxygen, currently satting 97% on room air  - On arrival: This may need some Tmax 100, PR 10 1101-112, RR 22-34, BP  as low as 106/88,   Per nursing staff with ambulation desatted to 88%  - improved vitals: 126/76, pulse 84, temperature 98.2 F (36.8 C), temperature source Oral, resp. rate (!) 30,weight  SpO2 94 %. -WBC 13.1, mildly hypoxic evaluated, lactic acid 0.0, -SARS-CoV-2 negative  -Imaging CT of the chest may be consistent with right upper lobe opacity consistent with pneumonia -in the setting of previous cancer site    -Per sepsis protocol, IV fluid hydration, broad-spectrum antibiotics cefepime and vancomycin has been initiated -Discontinue vancomycin today 01/14/2022 as MRSA screen negative Cultures -reported no growth yet  -We will follow-up with blood cultures according   Hypoxia - Resolved, satting 97% on room air now  - Per nursing staff with ambulation  desatted to 88% -Continue oxygen as needed to maintain O2 sat greater 94%  - As needed DuoNeb bronchodilators - Initiating small dose of IV steroids--reducing dose from 80 to 40 mg daily may be discontinued in a.m.  Left renal mass Reoccurring right upper lobe lung lesion, left renal Pole lesion  For CT findings: CTA: Right upper lobe finding ReallyCalled the enlarged cavitary right upper lobe lesion the patient was previously treated lung CA, in setting of symptoms consistent with possible pneumonia, local tumor recurrence cannot be excluded, right hilar lymphadenopathy, left kidney pole heterogeneous lesion suspicious of neoplasm, colonic diverticulosis Follow-up with oncologist PET scan may be necessary to rule out right upper lobe lesion, left renal findings.  -Would recommend follow-up with primary oncologist for evaluation -Discussed with Dr. Julien Nordmann  DM II (diabetes mellitus, type II), controlled ([place]) - Holding home occasional Glucophage -Checking CBG QA CHS with SSI coverage -A1c: 6.8 -Last A1c 01/27/2020 6.7  Essential hypertension - BP stable, monitor for possible hypotension due to sepsis -Otherwise if remains stable, will resume losartan -As needed IV hydralazine  Hypothyroidism - Continue home dose Synthroid  Hyperlipidemia - Continue home medication of simvastatin  BPH with obstruction/lower urinary tract symptoms - Continue Flomax   Malignant neoplasm of upper lobe of right lung ([place]) - In remission -  patient is to follow-up with a primary oncologist regarding new lesions kidney, and reimaging of right upperlung lesion    -CTA angiogram revealing route upper lobe cavitary lesion consistent with infiltrate versus previous or recurrent neoplasm  -We will have the patient follow-up with a primary oncologist for evaluation -Discussed with pulmonary Dr. Elsworth Soho, and Dr. Alfonse Spruce, patient's primary oncologist, lesion currently consistent with pneumonia--- we will  continue treatment of underlying cause of pneumonia             ----------------------------------------------------------------------------------------------------------------------------------------------- Nutritional status:  The patient's BMI is: Body mass index is 39.62 kg/m. I agree with the assessment and Hall as outlined -------------------------------------------------------------------------------------------------------------------------------------------------- Cultures; Blood Cultures x 2 >> NGT Sputum Culture >> not obtained yet ----------------------------------------------------------------------------------------------------------------------------------------------  DVT prophylaxis:  heparin injection 5,000 Units Start: 01/13/22 1400 TED hose Start: 01/13/22 1211 SCDs Start: 01/13/22 1211   Code Status:   Code Status: Full Code  Family Communication: No family member present at bedside- attempt will be made to update daily The above findings and Hall of care has been discussed with patient (and family)  in detail,  they expressed understanding and agreement of above. -Advance care planning has been discussed.   Disposition:  from home  Anticipating discharging home in next 1-2 days if remains stable   Ad mission status:   Status is: Inpatient Remains inpatient appropriate because: Continue needing IV antibiotics, IV fluids, treating sepsis pneumonia  Procedures:   No admission procedures for hospital encounter.   Antimicrobials:  Anti-infectives (From admission, onward)    Start     Dose/Rate Route Frequency Ordered Stop   01/14/22 1300  vancomycin (VANCOCIN) IVPB 1000 mg/200 mL premix  Status:  Discontinued        1,000 mg 200 mL/hr over 60 Minutes Intravenous Every 12 hours 01/14/22 0716 01/14/22 0948   01/14/22 0100  vancomycin (VANCOCIN) IVPB 1000 mg/200 mL premix  Status:  Discontinued        1,000 mg 200 mL/hr over 60 Minutes  Intravenous Every 12 hours 01/13/22 1242 01/14/22 0716   01/13/22 2200  ceFEPIme (MAXIPIME) 2 g in sodium chloride 0.9 % 100 mL IVPB        2 g 200 mL/hr over 30 Minutes Intravenous Every 8 hours 01/13/22 1242     01/13/22 1300  vancomycin (VANCOREADY) IVPB 2000 mg/400 mL        2,000 mg 200 mL/hr over 120 Minutes Intravenous  Once 01/13/22 1227 01/13/22 1533   01/13/22 1215  vancomycin (VANCOCIN) IVPB 1000 mg/200 mL premix  Status:  Discontinued        1,000 mg 200 mL/hr over 60 Minutes Intravenous  Once 01/13/22 1214 01/13/22 1226   01/13/22 1215  ceFEPIme (MAXIPIME) 2 g in sodium chloride 0.9 % 100 mL IVPB        2 g 200 mL/hr over 30 Minutes Intravenous  Once 01/13/22 1214 01/13/22 1428   01/13/22 0815  cefTRIAXone (ROCEPHIN) 2 g in sodium chloride 0.9 % 100 mL IVPB        2 g 200 mL/hr over 30 Minutes Intravenous  Once 01/13/22 0809 01/13/22 0936   01/13/22 0815  doxycycline (VIBRA-TABS) tablet 100 mg        100 mg Oral  Once 01/13/22 0809 01/13/22 0847        Medication:   aspirin EC  81 mg Oral Daily   budesonide (PULMICORT) nebulizer solution  0.25 mg Nebulization BID   Chlorhexidine Gluconate Cloth  6 each Topical Daily   cholecalciferol  1,000 Units Oral Daily   heparin  5,000 Units Subcutaneous Q8H   insulin aspart  0-6 Units Subcutaneous TID WC   levothyroxine  100 mcg Oral Daily   losartan  50 mg Oral Daily   methylPREDNISolone (SOLU-MEDROL) injection  40 mg Intravenous Q24H   simvastatin  10 mg Oral QPM   sodium chloride flush  3 mL Intravenous Q12H   tamsulosin  0.4 mg Oral Daily    acetaminophen **OR** acetaminophen, bisacodyl, hydrALAZINE, HYDROmorphone (DILAUDID) injection, ipratropium, levalbuterol, ondansetron **OR** ondansetron (ZOFRAN) IV, oxyCODONE, senna-docusate, sodium phosphate, traZODone   Objective:   Vitals:   01/13/22 2052 01/14/22 0429 01/14/22 0805 01/14/22 0820  BP: (!) 143/80 138/82  121/76  Pulse: 89 77  76  Resp: _0 Temp:  98.1 F (36.7 C) 98 F (36.7 C)  97.7 F (36.5 C)  TempSrc:    Oral  SpO2: 98% 95% 96% 97%  Weight:  121.7 kg    Height:        Intake/Output Summary (Last 24 hours) at 01/14/2022 1239 Last data filed at 01/14/2022 0900 Gross per 24 hour  Intake 3135.41 ml  Output --  Net 3135.41 ml   Filed Weights   01/13/22 0531 01/13/22 1817 01/14/22 0429  Weight: 120.2 kg 120.4 kg 121.7 kg     Examination:   Physical Exam  Constitution:  Alert, cooperative, no distress,  Appears calm and comfortable  Psychiatric:   Normal and stable mood and affect, cognition intact,   HEENT:        Normocephalic, PERRL, otherwise with in Normal limits  Chest:         Chest symmetric Cardio vascular:  S1/S2, RRR, No murmure, No Rubs or Gallops  pulmonary: Clear to auscultation bilaterally, respirations unlabored, negative wheezes / crackles Abdomen: Soft, non-tender, non-distended, bowel sounds,no masses, no organomegaly Muscular skeletal: Limited exam - in bed, able to move all 4 extremities,   Neuro: CNII-XII intact. , normal motor and sensation, reflexes intact  Extremities: No pitting edema lower extremities, +2 pulses  Skin: Dry, warm to touch, negative for any Rashes, No open wounds Wounds: per nursing documentation   ------------------------------------------------------------------------------------------------------------------------------------------    LABs:     Latest Ref Rng & Units 01/14/2022    5:00 AM 01/13/2022   12:29 PM 01/13/2022    8:08 AM  CBC  WBC 4.0 - 10.5 K/uL 9.1  11.5  13.1   Hemoglobin 13.0 - 17.0 g/dL 10.5  11.2  12.3   Hematocrit 39.0 - 52.0 % 32.7  34.7  37.8   Platelets 150 - 400 K/uL 238  237  261       Latest Ref Rng & Units 01/14/2022    5:00 AM 01/13/2022   12:29 PM 01/13/2022    8:08 AM  CMP  Glucose 70 - 99 mg/dL 248   190   BUN 8 - 23 mg/dL 13   12   Creatinine 0.61 - 1.24 mg/dL 0.76  0.74  0.88   Sodium 135 - 145 mmol/L 135   131   Potassium 3.5 -  5.1 mmol/L 3.8   3.6   Chloride 98 - 111 mmol/L 104   96   CO2 22 - 32 mmol/L 26   28   Calcium 8.9 - 10.3 mg/dL 8.2   8.4   Total Protein 6.5 - 8.1 g/dL   7.9   Total Bilirubin 0.3 - 1.2 mg/dL   1.6   Alkaline Phos 38 - 126 U/L   131   AST 15 - 41 U/L   38   ALT 0 - 44 U/L   52        Micro Results Recent Results (from the past 240 hour(s))  Urine Culture     Status: Abnormal   Collection Time: 01/12/22  6:27 AM   Specimen: Urine, Clean Catch  Result Value Ref Range Status   Specimen Description   Final    URINE, CLEAN CATCH Performed at [facility], [address], [place], [place] [address]    Special Requests   Final    NONE Performed at [facility], [address] [place], [place] [address]    Culture (A)  Final    <10,000 COLONIES/mL INSIGNIFICANT GROWTH Performed at [facility], [place] [address], [place], [place] [address]    Report Status 01/13/2022 FINAL  Final  SARS Coronavirus 2 by RT PCR (hospital order, performed in [facility] hospital lab) *cepheid single result test* Anterior Nasal Swab     Status: None   Collection Time: 01/13/22  5:48 AM   Specimen: Anterior Nasal Swab  Result Value Ref Range Status   SARS Coronavirus 2 by RT PCR NEGATIVE NEGATIVE Final    Comment: (NOTE) SARS-CoV-2 target nucleic acids are NOT DETECTED.  The SARS-CoV-2 RNA is generally detectable in upper and lower respiratory specimens during the acute phase of infection.  The lowest concentration of SARS-CoV-2 viral copies this assay can detect is 250 copies / mL. A negative result does not preclude SARS-CoV-2 infection and should not be used as the sole basis for treatment or other patient management decisions.  A negative result may occur with improper specimen collection / handling, submission of specimen other than nasopharyngeal swab, presence of viral mutation(s) within the areas targeted by this assay, and inadequate number of viral copies (<250 copies / mL). A  negative result must be combined with clinical observations, patient history, and epidemiological information.  Fact Sheet for Patients:   https://www.patel.info/  Fact Sheet for Healthcare Providers: https://hall.com/  This test is not yet approved or  cleared by the [place] FDA and has been authorized for detection and/or diagnosis of SARS-CoV-2 by FDA under an Emergency Use Authorization (EUA).  This EUA will remain in effect (meaning this test can be used) for the duration of the COVID-19 declaration under Section 564(b)(1) of the Act, 21 U.S.C. section 360bbb-3(b)(1), unless the authorization is terminated or revoked sooner.  Performed at [facility], [address], [place], [place] [address]   Blood culture (routine x 2)     Status: None (Preliminary result)   Collection Time: 01/13/22  8:08 AM   Specimen: Right Antecubital; Blood  Result Value Ref Range Status   Specimen Description   Final    RIGHT ANTECUBITAL BOTTLES DRAWN AEROBIC AND ANAEROBIC   Special Requests Blood Culture adequate volume  Final   Culture   Final    NO GROWTH < 24 HOURS Performed at [facility], [address], [place], [place] [address]    Report Status PENDING  Incomplete  Blood culture (routine x 2)     Status: None (Preliminary result)   Collection Time: 01/13/22  9:09 AM   Specimen: BLOOD LEFT FOREARM  Result Value Ref Range Status   Specimen Description   Final    BLOOD LEFT FOREARM BOTTLES DRAWN AEROBIC AND ANAEROBIC   Special Requests Blood Culture adequate volume  Final   Culture   Final    NO GROWTH < 24 HOURS Performed at [facility], [address], [place], [place] [address]    Report Status PENDING  Incomplete  MRSA Next Gen by PCR, Nasal     Status: None   Collection Time: 01/13/22  2:00 PM   Specimen: Nasal Mucosa; Nasal Swab  Result Value Ref Range Status   MRSA by PCR Next Gen NOT DETECTED NOT DETECTED Final    Comment:  (NOTE) The GeneXpert MRSA Assay (FDA approved for NASAL specimens only), is one component of a comprehensive MRSA colonization surveillance program. It is not intended to diagnose MRSA infection nor to guide or monitor treatment for MRSA infections. Test performance is not FDA approved in patients less than 23 years old. Performed at [facility], [address] [place], [place] [address]   Expectorated Sputum Assessment w Gram Stain, Rflx to Resp Cult     Status: None   Collection Time: 01/13/22  6:29 PM   Specimen: Expectorated Sputum  Result Value Ref Range Status   Specimen Description EXPECTORATED SPUTUM  Final   Special Requests NONE  Final   Sputum evaluation   Final    THIS SPECIMEN IS ACCEPTABLE FOR SPUTUM CULTURE Performed at [facility], [address] [place], [place] [address]    Report Status 01/13/2022 FINAL  Final  Culture, Respiratory w Gram Stain     Status: None (Preliminary result)  Collection Time: 01/13/22  6:29 PM  Result Value Ref Range Status   Specimen Description   Final    EXPECTORATED SPUTUM Performed at [facility], [address], [place], [place] [address]    Special Requests   Final    NONE Reflexed from [id number] Performed at [facility], [address], [place], [place] [address]    Gram Stain   Final    ABUNDANT WBC PRESENT, PREDOMINANTLY PMN ABUNDANT GRAM POSITIVE COCCI IN PAIRS FEW GRAM NEGATIVE RODS    Culture   Final    CULTURE REINCUBATED FOR BETTER GROWTH Performed at [facility], [place] [address], [place],  [address]    Report Status PENDING  Incomplete    Radiology Reports No results found.  SIGNED: Deatra James, MD, FHM. [facility],  Pager (please use [email or web address] to page/text) Please use Epic Secure Chat for non-urgent communication (7AM-7PM)  If 7PM-7AM, please contact night-coverage [email or web address], 01/14/2022, 12:39 PM

## 2022-01-14 NOTE — Inpatient Diabetes Management (Signed)
Inpatient Diabetes Program Recommendations  AACE/ADA: New Consensus Statement on Inpatient Glycemic Control   Target Ranges:  Prepandial:   less than 140 mg/dL      Peak postprandial:   less than 180 mg/dL (1-2 hours)      Critically ill patients:  140 - 180 mg/dL    Latest Reference Range & Units 01/13/22 18:38 01/13/22 21:41 01/14/22 07:30  Glucose-Capillary 70 - 99 mg/dL 247 (H) 298 (H) 242 (H)    Review of Glycemic Control  Diabetes history: DM2 Outpatient Diabetes medications: Metformin 500 mg BID Current orders for Inpatient glycemic control: Novolog 0-6 units TID with meals; Solumedrol 40 mg Q24H  Inpatient Diabetes Program Recommendations:    Insulin: Please consider increasing Novolog correction to 0-9 units AC&HS.  Diet: Please consider discontinuing Regular diet and ordering Carb Modified diet.  Thanks, Barnie Alderman, RN, MSN, [place] [facility] Coordinator Inpatient Diabetes Program [phone] (Team Pager from 8am to [place])

## 2022-01-14 NOTE — Evaluation (Signed)
Occupational Therapy Evaluation Patient Details Name: Billy Hall MRN: [id number] DOB: 03/05/1946 Today's Date: 01/14/2022   History of Present Illness Billy Hall 76 year old male with extensive history of DM 2, HTN, HLD, hypothyroidism, BPH, and h/o non-small cell cancer who is in remission now presenting with fever, increased cough, tachycardia, tachypnea, satting 88% on room air with exertion.  Apparently had a fever 100.4 at home, symptoms has been progressively getting worse over the last 2 days.  Reports he has been having cough for past 2 years since he had COVID but now is progressively worse associated with shortness of breath. (per MD)   Clinical Impression   Pt agreeable to OT and PT co-evaluation. Pt appears to be at or near baseline levels for functional ADL's and mobility. No physical assist needed today for lower body dressing, chair transfer, or ambulation in the hall. Pt demonstrates good B UE strength. Pt left in chair with call bell within reach. Pt is not recommended for further acute OT services and will be discharged to care of nursing staff for remaining length of stay.       Recommendations for follow up therapy are one component of a multi-disciplinary discharge planning process, led by the attending physician.  Recommendations may be updated based on patient status, additional functional criteria and insurance authorization.   Follow Up Recommendations  No OT follow up    Assistance Recommended at Discharge None  Patient can return home with the following      Functional Status Assessment  Patient has not had a recent decline in their functional status  Equipment Recommendations  None recommended by OT    Recommendations for Other Services       Precautions / Restrictions Precautions Precautions: None Restrictions Weight Bearing Restrictions: No      Mobility Bed Mobility Overal bed mobility: Independent                   Transfers Overall transfer level: Independent Equipment used: None               General transfer comment: Able to ambulate to chair and in hall independently.      Balance Overall balance assessment: Independent                                         ADL either performed or assessed with clinical judgement   ADL Overall ADL's : Independent                                       General ADL Comments: Able to doff and don socks independently at EOB.     Vision Baseline Vision/History: 1 Wears glasses Ability to See in Adequate Light: 0 Adequate Patient Visual Report: No change from baseline Vision Assessment?: No apparent visual deficits                Pertinent Vitals/Pain Pain Assessment Pain Assessment: No/denies pain     Hand Dominance Right   Extremity/Trunk Assessment Upper Extremity Assessment Upper Extremity Assessment: Overall WFL for tasks assessed   Lower Extremity Assessment Lower Extremity Assessment: Defer to PT evaluation   Cervical / Trunk Assessment Cervical / Trunk Assessment: Normal   Communication Communication Communication: No difficulties   Cognition Arousal/Alertness: Awake/alert Behavior During Therapy: [facility]  for tasks assessed/performed Overall Cognitive Status: Within Functional Limits for tasks assessed                                                        Home Living Family/patient expects to be discharged to:: Private residence Living Arrangements: Alone Available Help at Discharge: Family;Available PRN/intermittently Type of Home: House Home Access: Ramped entrance     Home Layout: Two level Alternate Level Stairs-Number of Steps: 7 Alternate Level Stairs-Rails: Left (going up) Bathroom Shower/Tub: Occupational psychologist: Handicapped height Bathroom Accessibility: No   Home Equipment: Conservation officer, nature (2 wheels);Cane - single  point;BSC/3in1;Wheelchair - manual;Shower seat - built in          Prior Functioning/Environment Prior Level of Function : Independent/Modified Independent             Mobility Comments: Hydrographic surveyor without AD ADLs Comments: Pt drives; independent                                Co-evaluation PT/OT/SLP Co-Evaluation/Treatment: Yes Reason for Co-Treatment: To address functional/ADL transfers   OT goals addressed during session: ADL's and self-care                       End of Session    Activity Tolerance: Patient tolerated treatment well Patient left: in chair;with call bell/phone within reach  OT Visit Diagnosis: Unsteadiness on feet (R26.81)                Time: 4268-3419 OT Time Calculation (min): 9 min Charges:  OT General Charges $OT Visit: 1 Visit OT Evaluation $OT Eval Low Complexity: 1 Low  Jadwiga Faidley OT, MOT  Larey Seat 01/14/2022, 9:26 AM

## 2022-01-14 NOTE — Evaluation (Signed)
Physical Therapy Evaluation Patient Details Name: Billy Hall MRN: [id number] DOB: Aug 09, 1945 Today's Date: 01/14/2022  History of Present Illness  ANOTHY BUFANO 76 year old male with extensive history of DM 2, HTN, HLD, hypothyroidism, BPH, and h/o non-small cell cancer who is in remission now presenting with fever, increased cough, tachycardia, tachypnea, satting 88% on room air with exertion.  Apparently had a fever 100.4 at home, symptoms has been progressively getting worse over the last 2 days.  Reports he has been having cough for past 2 years since he had COVID but now is progressively worse associated with shortness of breath.   Clinical Impression  Patient functioning near baseline for functional mobility and gait demonstrating good return for ambulation in room and hallways with no need for AD.  Plan:  Patient discharged from physical therapy to care of nursing for ambulation daily as tolerated for length of stay.         Recommendations for follow up therapy are one component of a multi-disciplinary discharge planning process, led by the attending physician.  Recommendations may be updated based on patient status, additional functional criteria and insurance authorization.  Follow Up Recommendations No PT follow up      Assistance Recommended at Discharge PRN  Patient can return home with the following  Other (comment) (patient near baseline)    Equipment Recommendations None recommended by PT  Recommendations for Other Services       Functional Status Assessment Patient has not had a recent decline in their functional status     Precautions / Restrictions Precautions Precautions: None Restrictions Weight Bearing Restrictions: No      Mobility  Bed Mobility Overal bed mobility: Independent                  Transfers Overall transfer level: Independent                      Ambulation/Gait Ambulation/Gait assistance: Modified  independent (Device/Increase time), Independent Gait Distance (Feet): 100 Feet Assistive device: None Gait Pattern/deviations: WFL(Within Functional Limits) Gait velocity: slightly decreased     General Gait Details: grossly WFL with good return for ambulating in room and halways without loss of balance, on room air with SpO2 at 93%  Stairs            Wheelchair Mobility    Modified Rankin (Stroke Patients Only)       Balance Overall balance assessment: No apparent balance deficits (not formally assessed)                                           Pertinent Vitals/Pain Pain Assessment Pain Assessment: No/denies pain    Home Living Family/patient expects to be discharged to:: Private residence Living Arrangements: Alone Available Help at Discharge: Family;Available PRN/intermittently Type of Home: House Home Access: Ramped entrance     Alternate Level Stairs-Number of Steps: 7 Home Layout: Two level Home Equipment: Rolling Walker (2 wheels);Cane - single point;BSC/3in1;Wheelchair - Brewing technologist - built in      Prior Function Prior Level of Function : Independent/Modified Independent             Mobility Comments: Hydrographic surveyor without AD ADLs Comments: Pt drives; independent     Hand Dominance   Dominant Hand: Right    Extremity/Trunk Assessment   Upper Extremity Assessment Upper Extremity Assessment: Defer to  OT evaluation    Lower Extremity Assessment Lower Extremity Assessment: Overall WFL for tasks assessed    Cervical / Trunk Assessment Cervical / Trunk Assessment: Normal  Communication   Communication: No difficulties  Cognition Arousal/Alertness: Awake/alert Behavior During Therapy: WFL for tasks assessed/performed Overall Cognitive Status: Within Functional Limits for tasks assessed                                          General Comments      Exercises     Assessment/Plan     PT Assessment Patient does not need any further PT services  PT Problem List         PT Treatment Interventions      PT Goals (Current goals can be found in the Care Plan section)  Acute Rehab PT Goals Patient Stated Goal: return home PT Goal Formulation: With patient Time For Goal Achievement: 01/14/22 Potential to Achieve Goals: Good    Frequency       Co-evaluation PT/OT/SLP Co-Evaluation/Treatment: Yes Reason for Co-Treatment: To address functional/ADL transfers PT goals addressed during session: Mobility/safety with mobility;Balance         AM-PAC PT "6 Clicks" Mobility  Outcome Measure Help needed turning from your back to your side while in a flat bed without using bedrails?: None Help needed moving from lying on your back to sitting on the side of a flat bed without using bedrails?: None Help needed moving to and from a bed to a chair (including a wheelchair)?: None Help needed standing up from a chair using your arms (e.g., wheelchair or bedside chair)?: None Help needed to walk in hospital room?: None Help needed climbing 3-5 steps with a railing? : None 6 Click Score: 24    End of Session   Activity Tolerance: Patient tolerated treatment well;Patient limited by fatigue Patient left: in chair;with call bell/phone within reach Nurse Communication: Mobility status PT Visit Diagnosis: Unsteadiness on feet (R26.81);Other abnormalities of gait and mobility (R26.89);Muscle weakness (generalized) (M62.81)    Time: 3794-3276 PT Time Calculation (min) (ACUTE ONLY): 12 min   Charges:   PT Evaluation $PT Eval Low Complexity: 1 Low PT Treatments $Therapeutic Activity: 8-22 mins        1:53 PM, 01/14/22 Lonell Grandchild, MPT Physical Therapist with [facility] [phone] office [phone] mobile phone

## 2022-01-15 DIAGNOSIS — A419 Sepsis, unspecified organism: Secondary | ICD-10-CM | POA: Diagnosis not present

## 2022-01-15 DIAGNOSIS — J9601 Acute respiratory failure with hypoxia: Secondary | ICD-10-CM | POA: Diagnosis not present

## 2022-01-15 DIAGNOSIS — R6520 Severe sepsis without septic shock: Secondary | ICD-10-CM | POA: Diagnosis not present

## 2022-01-15 LAB — CBC
HCT: 33.8 % — ABNORMAL LOW (ref 39.0–52.0)
Hemoglobin: 11 g/dL — ABNORMAL LOW (ref 13.0–17.0)
MCH: 26.6 pg (ref 26.0–34.0)
MCHC: 32.5 g/dL (ref 30.0–36.0)
MCV: 81.8 fL (ref 80.0–100.0)
Platelets: 300 10*3/uL (ref 150–400)
RBC: 4.13 MIL/uL — ABNORMAL LOW (ref 4.22–5.81)
RDW: 15.6 % — ABNORMAL HIGH (ref 11.5–15.5)
WBC: 11.5 10*3/uL — ABNORMAL HIGH (ref 4.0–10.5)
nRBC: 0 % (ref 0.0–0.2)

## 2022-01-15 LAB — BASIC METABOLIC PANEL
Anion gap: 7 (ref 5–15)
BUN: 14 mg/dL (ref 8–23)
CO2: 26 mmol/L (ref 22–32)
Calcium: 8.4 mg/dL — ABNORMAL LOW (ref 8.9–10.3)
Chloride: 104 mmol/L (ref 98–111)
Creatinine, Ser: 0.74 mg/dL (ref 0.61–1.24)
GFR, Estimated: >60 mL/min (ref >60)
Glucose, Bld: 211 mg/dL — ABNORMAL HIGH (ref 70–99)
Potassium: 4.2 mmol/L (ref 3.5–5.1)
Sodium: 137 mmol/L (ref 135–145)

## 2022-01-15 LAB — GLUCOSE, CAPILLARY: Glucose-Capillary: 183 mg/dL — ABNORMAL HIGH (ref 70–99)

## 2022-01-15 MED ORDER — AMOXICILLIN-POT CLAVULANATE 875-125 MG PO TABS: 1.0000 | ORAL_TABLET | Freq: Two times a day (BID) | ORAL | 0 refills | Status: AC

## 2022-01-15 MED ORDER — ALBUTEROL SULFATE HFA 108 (90 BASE) MCG/ACT IN AERS: 2.0000 | INHALATION_SPRAY | Freq: Four times a day (QID) | RESPIRATORY_TRACT | 2 refills | Status: AC | PRN

## 2022-01-15 NOTE — Progress Notes (Signed)
Pt has discharge orders, discharge teaching given and no further questions at this time. Pt wheeled down to ED parking lot where pts vehicle was located, family members went down with staff and pt as well. Pt wheeled to his vehicle.

## 2022-01-15 NOTE — Inpatient Diabetes Management (Signed)
Inpatient Diabetes Program Recommendations  AACE/ADA: New Consensus Statement on Inpatient Glycemic Control  Target Ranges:  Prepandial:   less than 140 mg/dL      Peak postprandial:   less than 180 mg/dL (1-2 hours)      Critically ill patients:  140 - 180 mg/dL    Latest Reference Range & Units 01/14/22 07:30 01/14/22 10:55 01/14/22 16:21 01/14/22 19:40  Glucose-Capillary 70 - 99 mg/dL 242 (H) 224 (H) 176 (H) 244 (H)    Review of Glycemic Control  Diabetes history: DM2 Outpatient Diabetes medications: Metformin 500 mg BID Current orders for Inpatient glycemic control: Novolog 0-6 units TID with meals; Solumedrol 40 mg Q24H   Inpatient Diabetes Program Recommendations:     Insulin: Please consider increasing Novolog correction to 0-9 units AC&HS.   Diet: Please consider discontinuing Regular diet and ordering Carb Modified diet.   Thanks, Barnie Alderman, RN, MSN, [place] Diabetes Coordinator Inpatient Diabetes Program [phone] (Team Pager from 8am to [place])

## 2022-01-15 NOTE — Discharge Summary (Signed)
Physician Discharge Summary  Billy Hall YKD:[id number] DOB: [birth year] DOA: 01/13/2022  PCP: [name], MD  Admit date: 01/13/2022  Discharge date: 01/15/2022  Admitted From:Home  Disposition:  Home  Recommendations for Outpatient Follow-up:  Follow up with PCP in 1-2 weeks Follow-up with oncologist Dr. Earlie Server as scheduled 10/19 to reevaluate renal mass as well as consider possible reimaging of lung mass Continue on Augmentin as prescribed for 5 more days to complete course of treatment for pneumonia Other home medications as prior  Home Health: None  Equipment/Devices: None  Discharge Condition:Stable  CODE STATUS: Full  Diet recommendation: Heart Healthy  Brief/Interim Summary: Billy Hall 76 year old male with extensive history of DM 2, HTN, HLD, hypothyroidism, BPH, and h/o non-small cell cancer who is in remission now presenting with fever, increased cough, tachycardia, tachypnea, satting 88% on room air with exertion.  He was admitted with sepsis, present on admission secondary to pneumonia as well as acute hypoxemic respiratory failure and was started on empiric treatment with IV antibiotics as well as IV steroids and breathing treatments.  He has improved considerably and is now in stable condition for discharge back to home with no other acute events noted during this brief admission.  He does have prior history of malignant neoplasm of his lung which is now in remission, but will require reimaging as there was concern for recurrent neoplasm.  He has close follow-up with his oncologist as noted above.  Discharge Diagnoses:  Principal Problem:   Sepsis ([place]) Active Problems:   Hypoxia   Essential hypertension   DM II (diabetes mellitus, type II), controlled ([place])   Left renal mass   BPH with obstruction/lower urinary tract symptoms   Hyperlipidemia   Hypothyroidism   Malignant neoplasm of upper lobe of right lung ([place])  Prinicpal discharge  diagnosis: Acute hypoxemic respiratory failure secondary to sepsis present on admission from pneumonia.  Discharge Instructions  Discharge Instructions     Diet - low sodium heart healthy   Complete by: As directed    Increase activity slowly   Complete by: As directed       Allergies as of 01/15/2022       Reactions   Ace Inhibitors Swelling, Rash        Medication List     STOP taking these medications    sulfamethoxazole-trimethoprim 800-160 MG tablet Commonly known as: BACTRIM DS       TAKE these medications    acetaminophen 650 MG CR tablet Commonly known as: TYLENOL Take 650 mg by mouth every 8 (eight) hours as needed for pain.   albuterol 108 (90 Base) MCG/ACT inhaler Commonly known as: VENTOLIN HFA Inhale 2 puffs into the lungs every 6 (six) hours as needed for wheezing or shortness of breath.   amoxicillin-clavulanate 875-125 MG tablet Commonly known as: AUGMENTIN Take 1 tablet by mouth 2 (two) times daily for 5 days.   aspirin EC 81 MG tablet Take 81 mg by mouth daily.   cholecalciferol 25 MCG (1000 UNIT) tablet Commonly known as: VITAMIN D3 Take 1,000 Units by mouth daily.   levothyroxine 100 MCG tablet Commonly known as: SYNTHROID Take 100 mcg by mouth daily.   losartan 50 MG tablet Commonly known as: COZAAR Take 50 mg by mouth daily.   metFORMIN 500 MG tablet Commonly known as: GLUCOPHAGE Take 500 mg by mouth 2 (two) times daily with a meal.   OneTouch Ultra test strip Generic drug: glucose blood 1 each 2 (two) times daily.  sildenafil 20 MG tablet Commonly known as: REVATIO Take 20 mg by mouth daily as needed (ED).   simvastatin 10 MG tablet Commonly known as: ZOCOR Take 10 mg by mouth every evening.   tamsulosin 0.4 MG Caps capsule Commonly known as: FLOMAX Take 0.4 mg by mouth daily.        Follow-up Information     [name], MD. Schedule an appointment as soon as possible for a visit in 1 week(s).    Specialty: Family Medicine Contact information: [place] [address] [phone]                Allergies  Allergen Reactions   Ace Inhibitors Swelling and Rash    Consultations: None   Procedures/Studies: CT CHEST ABDOMEN PELVIS W CONTRAST  Result Date: 01/13/2022 CLINICAL DATA:  Cough with fever and urinary incontinence for 3 days. History of lung cancer. EXAM: CT CHEST, ABDOMEN, AND PELVIS WITH CONTRAST TECHNIQUE: Multidetector CT imaging of the chest, abdomen and pelvis was performed following the standard protocol during bolus administration of intravenous contrast. RADIATION DOSE REDUCTION: This exam was performed according to the departmental dose-optimization program which includes automated exposure control, adjustment of the mA and/or kV according to patient size and/or use of iterative reconstruction technique. CONTRAST:  153mL OMNIPAQUE IOHEXOL 300 MG/ML  SOLN COMPARISON:  Chest radiographs 01/13/2022, chest CT 11/11/2021 and abdominopelvic CT 10/12/2021. PET-CT 02/06/2020. FINDINGS: CT CHEST FINDINGS Cardiovascular: Suboptimal contrast bolus. No acute vascular findings are identified. Right IJ Port-A-Cath extends to the superior cavoatrial junction. Mild aortic atherosclerosis and chronic central enlargement of the pulmonary arteries. The heart size is normal. There is no pericardial effusion. Mediastinum/Nodes: Interval mild enlargement of a right infrahilar node which measures 1.1 cm short axis on image 25/2 (previously 0.8 cm). No other enlarged mediastinal, hilar or axillary lymph nodes are identified. There is scarring and distortion in the right perihilar region attributed to prior radiation therapy. The thyroid gland, trachea and esophagus demonstrate no significant findings. Lungs/Pleura: Trace right pleural effusion. No pneumothorax. Chronic volume loss in the right upper lobe with peripheral cavitation. Compared with the most recent CT, the  cavitary component has enlarged, measuring approximately 5.3 x 2.4 cm on image 14/2, and there is increased peripheral right upper lobe convexity. Otherwise stable scarring in both lungs. No suspicious nodules within the aerated portions of the lungs. Musculoskeletal/Chest wall: No chest wall mass or suspicious osseous findings. Stable right upper rib deformities. CT ABDOMEN AND PELVIS FINDINGS Hepatobiliary: The liver is normal in density without suspicious focal abnormality. No evidence of gallstones, gallbladder wall thickening or biliary dilatation. Pancreas: Unremarkable. No pancreatic ductal dilatation or surrounding inflammatory changes. Spleen: Normal in size without focal abnormality. Adrenals/Urinary Tract: Both adrenal glands appear stable. Nonobstructing calculus in the lower pole of the right kidney. No evidence of ureteral calculus or hydronephrosis. 1.8 cm exophytic cyst in the upper pole of the right kidney appears stable, not requiring any imaging follow-up. However, there is an indeterminate mass involving the lower pole of the left kidney which demonstrates heterogeneous enhancement and measures up to 3.8 x 3.1 cm on image 27/8. The bladder appears normal for its degree of distention. Stomach/Bowel: No enteric contrast administered. The stomach appears unremarkable for its degree of distension. No evidence of bowel wall thickening, distention or surrounding inflammatory change. There are diverticular changes throughout the descending and sigmoid colon. Vascular/Lymphatic: There are no enlarged abdominal or pelvic lymph nodes. Aortic and branch vessel atherosclerosis without evidence of aneurysm  or large vessel occlusion. Reproductive: Heterogeneous enlargement of the prostate gland. Other: Small umbilical hernia containing only fat. No ascites or peritoneal nodularity. Musculoskeletal: No acute or significant osseous findings. Multilevel lumbar spondylosis. IMPRESSION: 1. Enlarging cavitary right  upper lobe lesion in this patient previously treated for lung cancer. In the setting of fever and cough, this could reflect superimposed infection. Local tumor recurrence not excluded. An adjacent prominent right infrahilar lymph node is nonspecific and could be reactive. 2. No evidence of acute inflammation in the abdomen or pelvis. 3. Heterogeneous mass involving the lower pole of the left kidney which is suspicious for neoplasm (renal cell carcinoma versus metastasis). Comparison imaging is limited to noncontrast images which limits assessment for stability. 4. No other evidence of metastatic disease. 5. Distal colonic diverticulosis. 6. PET-CT follow-up may be helpful to address the right upper lobe pulmonary and left renal findings. Alternatively, the left renal lesion could be further evaluated with dedicated renal MRI (without and with contrast). Electronically Signed   By: Richardean Sale M.D.   On: 01/13/2022 11:40   DG Chest 2 View  Result Date: 01/13/2022 CLINICAL DATA:  76 year old male with history of fever and cough. EXAM: CHEST - 2 VIEW COMPARISON:  Chest x-ray 01/27/2020. FINDINGS: Right internal jugular single-lumen power porta cath with tip terminating in the distal superior vena cava. Increasing mass-like opacity in the right mid to upper lung, similar to recent chest CT, likely chronic postradiation mass-like fibrosis. Left lung is clear. No pleural effusions. Mild elevation of the right hemidiaphragm is unchanged. No pneumothorax. No evidence of pulmonary edema. Heart size is normal. IMPRESSION: 1. Radiographic appearance the chest is similar to prior studies with apparent postradiation mass-like fibrosis throughout the right mid to upper lung. No definite radiographic evidence of acute cardiopulmonary disease. Electronically Signed   By: Vinnie Langton M.D.   On: 01/13/2022 06:16     Discharge Exam: Vitals:   01/15/22 0439 01/15/22 0729  BP: 136/73   Pulse: 73   Resp: 19   Temp:  98.4 F (36.9 C)   SpO2: 95% 96%   Vitals:   01/14/22 2211 01/15/22 0439 01/15/22 0500 01/15/22 0729  BP: (!) 140/78 136/73    Pulse: 82 73    Resp: 20 19    Temp: 98.2 F (36.8 C) 98.4 F (36.9 C)    TempSrc:      SpO2: 93% 95%  96%  Weight:   121.6 kg   Height:        General: Pt is alert, awake, not in acute distress Cardiovascular: RRR, S1/S2 +, no rubs, no gallops Respiratory: CTA bilaterally, no wheezing, no rhonchi Abdominal: Soft, NT, ND, bowel sounds + Extremities: no edema, no cyanosis    The results of significant diagnostics from this hospitalization (including imaging, microbiology, ancillary and laboratory) are listed below for reference.     Microbiology: Recent Results (from the past 240 hour(s))  Urine Culture     Status: Abnormal   Collection Time: 01/12/22  6:27 AM   Specimen: Urine, Clean Catch  Result Value Ref Range Status   Specimen Description   Final    URINE, CLEAN CATCH Performed at [facility], [address] [place], [place] [address]    Special Requests   Final    NONE Performed at [facility], [address] [place], [place] [address]    Culture (A)  Final    <10,000 COLONIES/mL INSIGNIFICANT GROWTH Performed at [facility]  [address], [place], [place] [address]    Report Status 01/13/2022 FINAL  Final  SARS Coronavirus 2 by RT PCR (hospital order, performed in [facility] hospital lab) *cepheid single result test* Anterior Nasal Swab     Status: None   Collection Time: 01/13/22  5:48 AM   Specimen: Anterior Nasal Swab  Result Value Ref Range Status   SARS Coronavirus 2 by RT PCR NEGATIVE NEGATIVE Final    Comment: (NOTE) SARS-CoV-2 target nucleic acids are NOT DETECTED.  The SARS-CoV-2 RNA is generally detectable in upper and lower respiratory specimens during the acute phase of infection. The lowest concentration of SARS-CoV-2 viral copies this assay can detect is 250 copies / mL. A negative result does not  preclude SARS-CoV-2 infection and should not be used as the sole basis for treatment or other patient management decisions.  A negative result may occur with improper specimen collection / handling, submission of specimen other than nasopharyngeal swab, presence of viral mutation(s) within the areas targeted by this assay, and inadequate number of viral copies (<250 copies / mL). A negative result must be combined with clinical observations, patient history, and epidemiological information.  Fact Sheet for Patients:   https://www.patel.info/  Fact Sheet for Healthcare Providers: https://hall.com/  This test is not yet approved or  cleared by the [place] FDA and has been authorized for detection and/or diagnosis of SARS-CoV-2 by FDA under an Emergency Use Authorization (EUA).  This EUA will remain in effect (meaning this test can be used) for the duration of the COVID-19 declaration under Section 564(b)(1) of the Act, 21 U.S.C. section 360bbb-3(b)(1), unless the authorization is terminated or revoked sooner.  Performed at [facility], [address], [place], [place] [address]   Blood culture (routine x 2)     Status: None (Preliminary result)   Collection Time: 01/13/22  8:08 AM   Specimen: Right Antecubital; Blood  Result Value Ref Range Status   Specimen Description   Final    RIGHT ANTECUBITAL BOTTLES DRAWN AEROBIC AND ANAEROBIC   Special Requests Blood Culture adequate volume  Final   Culture   Final    NO GROWTH 2 DAYS Performed at [facility], [address], [place], [place] [address]    Report Status PENDING  Incomplete  Blood culture (routine x 2)     Status: None (Preliminary result)   Collection Time: 01/13/22  9:09 AM   Specimen: BLOOD LEFT FOREARM  Result Value Ref Range Status   Specimen Description   Final    BLOOD LEFT FOREARM BOTTLES DRAWN AEROBIC AND ANAEROBIC   Special Requests Blood Culture adequate volume   Final   Culture   Final    NO GROWTH 2 DAYS Performed at [facility], [address] [place], [place] [address]    Report Status PENDING  Incomplete  MRSA Next Gen by PCR, Nasal     Status: None   Collection Time: 01/13/22  2:00 PM   Specimen: Nasal Mucosa; Nasal Swab  Result Value Ref Range Status   MRSA by PCR Next Gen NOT DETECTED NOT DETECTED Final    Comment: (NOTE) The GeneXpert MRSA Assay (FDA approved for NASAL specimens only), is one component of a comprehensive MRSA colonization surveillance program. It is not intended to diagnose MRSA infection nor to guide or monitor treatment for MRSA infections. Test performance is not FDA approved in patients less than 22 years old. Performed at [facility], [address] [place], [place] [address]   Expectorated  Sputum Assessment w Gram Stain, Rflx to Resp Cult     Status: None   Collection Time: 01/13/22  6:29 PM   Specimen: Expectorated Sputum  Result Value Ref Range Status   Specimen Description EXPECTORATED SPUTUM  Final   Special Requests NONE  Final   Sputum evaluation   Final    THIS SPECIMEN IS ACCEPTABLE FOR SPUTUM CULTURE Performed at [facility], [address] [place], [place] [address]    Report Status 01/13/2022 FINAL  Final  Culture, Respiratory w Gram Stain     Status: None (Preliminary result)   Collection Time: 01/13/22  6:29 PM  Result Value Ref Range Status   Specimen Description   Final    EXPECTORATED SPUTUM Performed at [facility], [address] [place], [place] [address]    Special Requests   Final    NONE Reflexed from [id number] Performed at [facility], [address], [place], [place] [address]    Gram Stain   Final    ABUNDANT WBC PRESENT, PREDOMINANTLY PMN ABUNDANT GRAM POSITIVE COCCI IN PAIRS FEW GRAM NEGATIVE RODS    Culture   Final    CULTURE REINCUBATED FOR BETTER GROWTH Performed at [facility], [place] [address], [place], [place] [address]    Report Status PENDING  Incomplete      Labs: BNP (last 3 results) No results for input(s): "BNP" in the last 8760 hours. Basic Metabolic Panel: Recent Labs  Lab 01/12/22 0638 01/13/22 0808 01/13/22 1229 01/14/22 0500 01/15/22 0457  NA 132* 131*  --  135 137  K 4.1 3.6  --  3.8 4.2  CL 94* 96*  --  104 104  CO2 27 28  --  26 26  GLUCOSE 185* 190*  --  248* 211*  BUN 12 12  --  13 14  CREATININE 0.88 0.88 0.74 0.76 0.74  CALCIUM 8.8* 8.4*  --  8.2* 8.4*  MG  --   --  2.0  --   --   PHOS  --   --  3.0  --   --    Liver Function Tests: Recent Labs  Lab 01/13/22 0808  AST 38  ALT 52*  ALKPHOS 131*  BILITOT 1.6*  PROT 7.9  ALBUMIN 2.9*   No results for input(s): "LIPASE", "AMYLASE" in the last 168 hours. No results for input(s): "AMMONIA" in the last 168 hours. CBC: Recent Labs  Lab 01/12/22 0638 01/13/22 0808 01/13/22 1229 01/14/22 0500 01/15/22 0457  WBC 12.1* 13.1* 11.5* 9.1 11.5*  NEUTROABS 10.1* 10.7*  --   --   --   HGB 13.1 12.3* 11.2* 10.5* 11.0*  HCT 40.2 37.8* 34.7* 32.7* 33.8*  MCV 81.0 81.1 81.3 81.3 81.8  PLT 270 261 237 238 300   Cardiac Enzymes: No results for input(s): "CKTOTAL", "CKMB", "CKMBINDEX", "TROPONINI" in the last 168 hours. BNP: Invalid input(s): "POCBNP" CBG: Recent Labs  Lab 01/14/22 0730 01/14/22 1055 01/14/22 1621 01/14/22 1940 01/15/22 0723  GLUCAP 242* 224* 176* 244* 183*   D-Dimer No results for input(s): "DDIMER" in the last 72 hours. Hgb A1c Recent Labs    01/13/22 1229  HGBA1C 6.8*   Lipid Profile No results for input(s): "CHOL", "HDL", "LDLCALC", "TRIG", "CHOLHDL", "LDLDIRECT" in the last 72 hours. Thyroid function studies No results for input(s): "TSH", "T4TOTAL", "T3FREE", "THYROIDAB" in the last 72 hours.  Invalid input(s): "FREET3" Anemia work up No results for input(s): "VITAMINB12", "FOLATE", "FERRITIN", "TIBC", "IRON", "RETICCTPCT" in the last  72 hours. Urinalysis    Component Value Date/Time   COLORURINE AMBER (A) 01/13/2022  0728   APPEARANCEUR CLEAR 01/13/2022 0728   APPEARANCEUR Clear 12/12/2021 1446   LABSPEC 1.020 01/13/2022 0728   PHURINE 6.0 01/13/2022 0728   GLUCOSEU NEGATIVE 01/13/2022 0728   HGBUR MODERATE (A) 01/13/2022 0728   BILIRUBINUR NEGATIVE 01/13/2022 0728   BILIRUBINUR Negative 12/12/2021 1446   KETONESUR 5 (A) 01/13/2022 0728   PROTEINUR 100 (A) 01/13/2022 0728   NITRITE NEGATIVE 01/13/2022 0728   LEUKOCYTESUR NEGATIVE 01/13/2022 0728   Sepsis Labs Recent Labs  Lab 01/13/22 0808 01/13/22 1229 01/14/22 0500 01/15/22 0457  WBC 13.1* 11.5* 9.1 11.5*   Microbiology Recent Results (from the past 240 hour(s))  Urine Culture     Status: Abnormal   Collection Time: 01/12/22  6:27 AM   Specimen: Urine, Clean Catch  Result Value Ref Range Status   Specimen Description   Final    URINE, CLEAN CATCH Performed at [facility], [address], [place], [place] [address]    Special Requests   Final    NONE Performed at [facility], [address] [place], [place] [address]    Culture (A)  Final    <10,000 COLONIES/mL INSIGNIFICANT GROWTH Performed at [facility], [place] [address], [place], [place] [address]    Report Status 01/13/2022 FINAL  Final  SARS Coronavirus 2 by RT PCR (hospital order, performed in [facility] hospital lab) *cepheid single result test* Anterior Nasal Swab     Status: None   Collection Time: 01/13/22  5:48 AM   Specimen: Anterior Nasal Swab  Result Value Ref Range Status   SARS Coronavirus 2 by RT PCR NEGATIVE NEGATIVE Final    Comment: (NOTE) SARS-CoV-2 target nucleic acids are NOT DETECTED.  The SARS-CoV-2 RNA is generally detectable in upper and lower respiratory specimens during the acute phase of infection. The lowest concentration of SARS-CoV-2 viral copies this assay can detect is 250 copies / mL. A negative result does not preclude SARS-CoV-2 infection and should not be used as the sole basis for treatment or other patient management  decisions.  A negative result may occur with improper specimen collection / handling, submission of specimen other than nasopharyngeal swab, presence of viral mutation(s) within the areas targeted by this assay, and inadequate number of viral copies (<250 copies / mL). A negative result must be combined with clinical observations, patient history, and epidemiological information.  Fact Sheet for Patients:   https://www.patel.info/  Fact Sheet for Healthcare Providers: https://hall.com/  This test is not yet approved or  cleared by the [place] FDA and has been authorized for detection and/or diagnosis of SARS-CoV-2 by FDA under an Emergency Use Authorization (EUA).  This EUA will remain in effect (meaning this test can be used) for the duration of the COVID-19 declaration under Section 564(b)(1) of the Act, 21 U.S.C. section 360bbb-3(b)(1), unless the authorization is terminated or revoked sooner.  Performed at [facility], [address], [place], [place] [address]   Blood culture (routine x 2)     Status: None (Preliminary result)   Collection Time: 01/13/22  8:08 AM   Specimen: Right Antecubital; Blood  Result Value Ref Range Status   Specimen Description   Final    RIGHT ANTECUBITAL BOTTLES DRAWN AEROBIC AND ANAEROBIC   Special Requests Blood Culture adequate volume  Final   Culture   Final    NO GROWTH 2 DAYS Performed at [facility], [address], [place], [place] [address]    Report Status PENDING  Incomplete  Blood culture (routine x 2)     Status: None (Preliminary result)   Collection Time: 01/13/22  9:09 AM   Specimen: BLOOD LEFT FOREARM  Result Value Ref Range Status   Specimen Description   Final    BLOOD LEFT FOREARM BOTTLES DRAWN AEROBIC AND ANAEROBIC   Special Requests Blood Culture adequate volume  Final   Culture   Final    NO GROWTH 2 DAYS Performed at [facility], [address], [place], [place]  [address]    Report Status PENDING  Incomplete  MRSA Next Gen by PCR, Nasal     Status: None   Collection Time: 01/13/22  2:00 PM   Specimen: Nasal Mucosa; Nasal Swab  Result Value Ref Range Status   MRSA by PCR Next Gen NOT DETECTED NOT DETECTED Final    Comment: (NOTE) The GeneXpert MRSA Assay (FDA approved for NASAL specimens only), is one component of a comprehensive MRSA colonization surveillance program. It is not intended to diagnose MRSA infection nor to guide or monitor treatment for MRSA infections. Test performance is not FDA approved in patients less than 32 years old. Performed at [facility], [address] [place], [place] [address]   Expectorated Sputum Assessment w Gram Stain, Rflx to Resp Cult     Status: None   Collection Time: 01/13/22  6:29 PM   Specimen: Expectorated Sputum  Result Value Ref Range Status   Specimen Description EXPECTORATED SPUTUM  Final   Special Requests NONE  Final   Sputum evaluation   Final    THIS SPECIMEN IS ACCEPTABLE FOR SPUTUM CULTURE Performed at [facility], [address], [place], [place] [address]    Report Status 01/13/2022 FINAL  Final  Culture, Respiratory w Gram Stain     Status: None (Preliminary result)   Collection Time: 01/13/22  6:29 PM  Result Value Ref Range Status   Specimen Description   Final    EXPECTORATED SPUTUM Performed at [facility], [address], [place], [place] [address]    Special Requests   Final    NONE Reflexed from [id number] Performed at [facility], [address], [place], [place] [address]    Gram Stain   Final    ABUNDANT WBC PRESENT, PREDOMINANTLY PMN ABUNDANT GRAM POSITIVE COCCI IN PAIRS FEW GRAM NEGATIVE RODS    Culture   Final    CULTURE REINCUBATED FOR BETTER GROWTH Performed at [facility], [place] [address] [place], [place] [address]    Report Status PENDING  Incomplete     Time coordinating discharge: 35 minutes  SIGNED:   Rodena Goldmann, DO [facility] 01/15/2022, 10:23 AM  If 7PM-7AM, please contact night-coverage [email or web address]

## 2022-01-16 DIAGNOSIS — Z23 Encounter for immunization: Secondary | ICD-10-CM | POA: Diagnosis not present

## 2022-01-16 DIAGNOSIS — J449 Chronic obstructive pulmonary disease, unspecified: Secondary | ICD-10-CM | POA: Diagnosis not present

## 2022-01-16 DIAGNOSIS — C3411 Malignant neoplasm of upper lobe, right bronchus or lung: Secondary | ICD-10-CM | POA: Diagnosis not present

## 2022-01-16 DIAGNOSIS — I10 Essential (primary) hypertension: Secondary | ICD-10-CM | POA: Diagnosis not present

## 2022-01-16 LAB — CULTURE, RESPIRATORY W GRAM STAIN: Culture: NORMAL

## 2022-01-18 LAB — CULTURE, BLOOD (ROUTINE X 2)
Culture: NO GROWTH
Culture: NO GROWTH
Special Requests: ADEQUATE
Special Requests: ADEQUATE

## 2022-01-21 ENCOUNTER — Encounter: Payer: Self-pay | Admitting: Urology

## 2022-01-21 ENCOUNTER — Ambulatory Visit: Payer: Medicare Other | Admitting: Urology

## 2022-01-21 VITALS — BP 134/76 | HR 94

## 2022-01-21 DIAGNOSIS — Z87440 Personal history of urinary (tract) infections: Secondary | ICD-10-CM

## 2022-01-21 DIAGNOSIS — R310 Gross hematuria: Secondary | ICD-10-CM | POA: Diagnosis not present

## 2022-01-21 DIAGNOSIS — N401 Enlarged prostate with lower urinary tract symptoms: Secondary | ICD-10-CM

## 2022-01-21 DIAGNOSIS — N138 Other obstructive and reflux uropathy: Secondary | ICD-10-CM

## 2022-01-21 DIAGNOSIS — N2889 Other specified disorders of kidney and ureter: Secondary | ICD-10-CM

## 2022-01-21 MED ORDER — ALFUZOSIN HCL ER 10 MG PO TB24: 10.0000 mg | ORAL_TABLET | Freq: Every day | ORAL | 11 refills | Status: DC

## 2022-01-21 NOTE — Progress Notes (Signed)
post void residual=1

## 2022-01-21 NOTE — Progress Notes (Signed)
Assessment: 1. Gross hematuria - likely associated with UTI   2. History of UTI   3. BPH with obstruction/lower urinary tract symptoms   4. Left renal mass     Plan: I reviewed records from the patient's recent hospitalization including lab results and imaging results. I personally viewed the CT study from 01/13/2022 showing a 3 cm left lower pole renal mass.  Possible diagnoses including primary renal cell carcinoma, benign renal tumors, and metastatic disease discussed. I discussed further evaluation of the renal mass with a MRI with and without contrast.  He would like to discuss this with Dr. Julien Nordmann at his visit next month. Trial of alfuzosin 10 mg daily in place of tamsulosin. Return to office in 1 month.  Chief Complaint:  Chief Complaint  Patient presents with   Hematuria    History of Present Illness:  Billy Hall is a 76 y.o. year old male who is seen for further evaluation of recent UTI.  He presented to the emergency room on 10/12/2021 with symptoms of frequency, dysuria, gross hematuria, and right back discomfort.  Symptoms were present for approximately 24 hours.  Urinalysis showed >50 RBCs, >50 WBCs.  Urine culture grew >100 K E. coli, pansensitive.  CT abdomen and pelvis without contrast showed no hydronephrosis or hydroureter, a 4 mm stone in the lower pole of the right kidney and a 3 mm stone in the lower pole of the left kidney.  He was treated with cephalexin.  His symptoms resolved.  He had onset of similar symptoms with dysuria and gross hematuria in July 2023.  This was treated by Dr. Hilma Favors.  No fevers or chills associated with these episodes. He was not having any dysuria or gross hematuria at his visit in July 2023.  He reported frequency and urgency.  No prior history of UTIs or gross hematuria.  No flank pain. Urinalysis showed no evidence of UTI.  He was previously seen in September 2022 for evaluation of a penile lesion.  He noted an area of  irritation following condom use.  Examination showed no obvious penile lesions.  DRE demonstrated a 40 g prostate without nodularity or tenderness.   He has completed chemoradiation for treatment of lung cancer.  He developed symptoms similar to his prior UTI episodes in early August 2023.  Urinalysis showed >30 WBCs.  Urine culture grew 50-100 K E. coli.  He was treated with Bactrim. He noted improvement in his urinary symptoms following treatment of the UTI.  No further dysuria or recent gross hematuria.  He continued with some frequency.  He continued on tamsulosin. IPSS = 8. Cystoscopy from 8/23 showed lateral lobe enlargement of prostate, bladder trabeculations.  No papillary lesions seen. CT abdomen and pelvis with contrast from 01/13/22 showed an indeterminate mass involving the lower pole of the left kidney which demonstrates heterogeneous enhancement and measures up to 3.8 x 3.1 cm.  He returns today for follow-up.  He continues on tamsulosin 0.4 mg daily.  No further episodes of gross hematuria.  No recent UTI symptoms.  He continues to with lower urinary tract symptoms including urgency, frequency, sensation of incomplete emptying, decreased stream, and nocturia x3.  No dysuria. IPSS = 18 today.   Portions of the above documentation were copied from a prior visit for review purposes only.   Past Medical History:  Past Medical History:  Diagnosis Date   Arthritis    Depressive disorder    Diabetes mellitus without complication ([place])  Fatigue    Headache(784.0)    Hyperlipidemia    Hypertension    Hypothyroidism    Snoring     Past Surgical History:  Past Surgical History:  Procedure Laterality Date   COLONOSCOPY N/A 03/08/2013   Procedure: COLONOSCOPY;  Surgeon: Jamesetta So, MD;  [facility];  Service: Gastroenterology;  Laterality: N/A;   IR IMAGING GUIDED PORT INSERTION  03/08/2020   IR US GUIDE BX ASP/DRAIN  03/08/2020   KNEE ARTHROSCOPY Right     2006 By Dr. Luna Glasgow at [facility].    THYROID SURGERY     VIDEO BRONCHOSCOPY WITH ENDOBRONCHIAL ULTRASOUND N/A 01/30/2020   Procedure: VIDEO BRONCHOSCOPY WITH ENDOBRONCHIAL ULTRASOUND;  Surgeon: Melrose Nakayama, MD;  Location: [place];  Service: Thoracic;  Laterality: N/A;    Allergies:  Allergies  Allergen Reactions   Ace Inhibitors Swelling and Rash    Family History:  Family History  Problem Relation Age of Onset   Alzheimer's disease Mother     Social History:  Social History   Tobacco Use   Smoking status: Former   Smokeless tobacco: Never   Tobacco comments:    Quit 2004  Vaping Use   Vaping Use: Never used  Substance Use Topics   Alcohol use: Not Currently    Alcohol/week: 0.0 standard drinks of alcohol    Comment: has quit since 2001   Drug use: No    ROS: Constitutional:  Negative for fever, chills, weight loss CV: Negative for chest pain, previous MI, hypertension Respiratory:  Negative for shortness of breath, wheezing, sleep apnea, frequent cough GI:  Negative for nausea, vomiting, bloody stool, GERD  Physical exam: BP 134/76   Pulse 94  GENERAL APPEARANCE:  Well appearing, well developed, well nourished, NAD HEENT:  Atraumatic, normocephalic, oropharynx clear NECK:  Supple without lymphadenopathy or thyromegaly ABDOMEN:  Soft, non-tender, no masses EXTREMITIES:  Moves all extremities well, without clubbing, cyanosis, or edema NEUROLOGIC:  Alert and oriented x 3, normal gait, CN II-XII grossly intact MENTAL STATUS:  appropriate BACK:  Non-tender to palpation, No CVAT SKIN:  Warm, dry, and intact  Results: U/A: 0-5 WBCs, no RBCs,  PVR = 1 mL

## 2022-01-22 LAB — MICROSCOPIC EXAMINATION
Bacteria, UA: NONE SEEN
Epithelial Cells (non renal): NONE SEEN /hpf (ref 0–10)
RBC, Urine: NONE SEEN /hpf (ref 0–2)

## 2022-01-22 LAB — URINALYSIS, ROUTINE W REFLEX MICROSCOPIC
Bilirubin, UA: NEGATIVE
Glucose, UA: NEGATIVE
Ketones, UA: NEGATIVE
Nitrite, UA: NEGATIVE
Protein,UA: NEGATIVE
RBC, UA: NEGATIVE
Specific Gravity, UA: 1.015 (ref 1.005–1.030)
Urobilinogen, Ur: 0.2 mg/dL (ref 0.2–1.0)
pH, UA: 7 (ref 5.0–7.5)

## 2022-01-23 ENCOUNTER — Telehealth: Payer: Self-pay | Admitting: Internal Medicine

## 2022-01-23 ENCOUNTER — Telehealth: Payer: Self-pay | Admitting: *Deleted

## 2022-01-23 NOTE — Telephone Encounter (Signed)
Called patient regarding upcoming October appointments, patient is notified.  

## 2022-01-23 NOTE — Telephone Encounter (Signed)
Connected with [name] son Williams Dietrick [phone]).  Denies receipt of [place] for father to sign.  Declined verbal authorization advising [place] [employer] [place] required for third parties to receive protected health information (PHI).  Both forms at entry receptionist desk to complete and sign upon next scheduled visit as requested with call.  Currently no further needs or questions.

## 2022-02-03 ENCOUNTER — Ambulatory Visit: Payer: Medicare Other | Admitting: Pulmonary Disease

## 2022-02-03 ENCOUNTER — Encounter: Payer: Self-pay | Admitting: Pulmonary Disease

## 2022-02-03 VITALS — BP 122/70 | HR 76 | Temp 98.1°F | Ht 69.0 in | Wt 267.4 lb

## 2022-02-03 DIAGNOSIS — J984 Other disorders of lung: Secondary | ICD-10-CM

## 2022-02-03 DIAGNOSIS — J189 Pneumonia, unspecified organism: Secondary | ICD-10-CM | POA: Diagnosis not present

## 2022-02-03 DIAGNOSIS — C3411 Malignant neoplasm of upper lobe, right bronchus or lung: Secondary | ICD-10-CM

## 2022-02-03 NOTE — Progress Notes (Signed)
Subjective:    Patient ID: Billy Hall, male    DOB: 08-Jul-1945, 76 y.o.   MRN: [id number]  HPI  76 yo ex-smoker, quit 2004, for follow-up of right upper lobe cavitary pneumonia   He presented with right upper lobe mass in October 2021 and was diagnosed as stage IIIa squamous cell carcinoma.  He underwent a course of concurrent chemoradiation followed by consolidation immunotherapy. His imaging studies showed decrease in size and central cavitation of right upper lobe mass consistent with response to therapy Last CT scan 11/11/2021 showed volume loss and consolidation of the right upper lobe with peripheral cavitation and central air bronchograms with an occluded right upper lobe pulmonary artery with.  Complex cyst was noted in the right upper pole of the kidney He was admitted 9/18 to 9/24  for fever 100.4 increased cough , saturation 88% on room air.  Labs showed mild leukocytosis and hyponatremia   CT chest/abdomen/pelvis showed enlarging right upper lobe cavitary lesion, measuring 5.3 x 2.4 cm , right infrahilar lymph node was enlarged at 1.1 cm.  There was a new 2.8 x 3.1 cm mass in the lower pole of the left kidney Impression was superinfection /pneumonia in  right upper lobe cavitary mass Respiratory culture was negative, stain showed gram-negative rods, urine strep antigen was negative He was treated with 2 days of IV antibiotics and discharged on 5 days of Augmentin.  He states that antibiotic gave him diarrhea but he did complete. He now reports cough productive of gray sputum, no fevers. Breathing is improved he seldom uses albuterol   PMH - Stage IIIA (T4, N0, M0) non-small cell lung cancer, squamous cell carcinoma presented with large right upper lobe lung mass diagnosed in October 2021.   PRIOR THERAPY:  1) Concurrent chemoradiation with weekly carboplatin for AUC of 2 and paclitaxel 45 mg/M2. Status post 6 cycles. First dose on 02/27/20.  Last dose was given April 02, 2020. 2) Consolidation treatment with immunotherapy with Imfinzi 1500 mg IV every 4 weeks.  First dose May 10, 2020.  Status post 13 cycles.   -Diabetes type 2 -Hypertension BPH   Significant tests/ events reviewed   CT chest/abdomen/pelvis showed enlarging right upper lobe cavitary lesion, measuring 5.3 x 2.4 cm , right infrahilar lymph node was mildly enlarged at 1.1 cm.  There was a new 2.8 x 3.1 cm mass in the lower pole of the left kidney  PFTs 01/2020 show moderate restriction with decreased DLCO  Review of Systems neg for any significant sore throat, dysphagia, itching, sneezing, nasal congestion or excess/ purulent secretions, fever, chills, sweats, unintended wt loss, pleuritic or exertional cp, hempoptysis, orthopnea pnd or change in chronic leg swelling. Also denies presyncope, palpitations, heartburn, abdominal pain, nausea, vomiting, diarrhea or change in bowel or urinary habits, dysuria,hematuria, rash, arthralgias, visual complaints, headache, numbness weakness or ataxia.     Objective:   Physical Exam  Gen. Pleasant, obese, in no distress, normal affect ENT - no pallor,icterus, no post nasal drip, class 2 airway Neck: No JVD, no thyromegaly, no carotid bruits Lungs: no use of accessory muscles, no dullness to percussion, coarse breath sounds on right with bronchial breathing right suprascapular Cardiovascular: Rhythm regular, heart sounds  normal, no murmurs or gallops, no peripheral edema Abdomen: soft and non-tender, no hepatosplenomegaly, BS normal. Musculoskeletal: No deformities, no cyanosis or clubbing Neuro:  alert, non focal, no tremors       Assessment & Plan:   Cavitary pneumonia -he was  treated with 7 days of antibiotic.  Ideally he needs a longer course of antibiotic, unfortunately Augmentin gave him diarrhea.  I offered him giving Levaquin or doxycycline for another week but he does not want further antibiotics.  COPD -previous PFTs did not  show significant airway obstruction, more restriction due to his obesity.  His right upper lobe appears damaged from chemoradiation and now superimposed pneumonia.  He does not want to use bronchodilators.  He will use his albuterol MDI on an as-needed basis.

## 2022-02-03 NOTE — Assessment & Plan Note (Signed)
He is due for repeat CT scan in 1 week by oncology. We will review this for signs of cavitation and worsening consolidation in the right upper lobe area, if so we can treat him with antibiotics such as Levaquin for another 1 to 2 weeks.

## 2022-02-03 NOTE — Patient Instructions (Signed)
Good luck with Ct scan next week Based on this, we will decide the need for more antibiotics Take albuterol MDI as needed for shortness of breath

## 2022-02-11 ENCOUNTER — Other Ambulatory Visit: Payer: Self-pay

## 2022-02-11 ENCOUNTER — Inpatient Hospital Stay: Payer: Medicare Other

## 2022-02-11 ENCOUNTER — Inpatient Hospital Stay: Payer: Medicare Other | Attending: Internal Medicine

## 2022-02-11 ENCOUNTER — Ambulatory Visit (HOSPITAL_COMMUNITY)
Admission: RE | Admit: 2022-02-11 | Discharge: 2022-02-11 | Disposition: A | Discharge status: Home / Self Care | Payer: Medicare Other | Source: Ambulatory Visit | Attending: Internal Medicine | Admitting: Internal Medicine

## 2022-02-11 DIAGNOSIS — E785 Hyperlipidemia, unspecified: Secondary | ICD-10-CM | POA: Insufficient documentation

## 2022-02-11 DIAGNOSIS — C3490 Malignant neoplasm of unspecified part of unspecified bronchus or lung: Secondary | ICD-10-CM | POA: Insufficient documentation

## 2022-02-11 DIAGNOSIS — E119 Type 2 diabetes mellitus without complications: Secondary | ICD-10-CM | POA: Insufficient documentation

## 2022-02-11 DIAGNOSIS — Z79890 Hormone replacement therapy (postmenopausal): Secondary | ICD-10-CM | POA: Insufficient documentation

## 2022-02-11 DIAGNOSIS — C3411 Malignant neoplasm of upper lobe, right bronchus or lung: Secondary | ICD-10-CM | POA: Insufficient documentation

## 2022-02-11 DIAGNOSIS — E039 Hypothyroidism, unspecified: Secondary | ICD-10-CM | POA: Insufficient documentation

## 2022-02-11 DIAGNOSIS — Z79899 Other long term (current) drug therapy: Secondary | ICD-10-CM | POA: Insufficient documentation

## 2022-02-11 DIAGNOSIS — R059 Cough, unspecified: Secondary | ICD-10-CM | POA: Insufficient documentation

## 2022-02-11 DIAGNOSIS — Z95828 Presence of other vascular implants and grafts: Secondary | ICD-10-CM

## 2022-02-11 DIAGNOSIS — I700 Atherosclerosis of aorta: Secondary | ICD-10-CM | POA: Insufficient documentation

## 2022-02-11 DIAGNOSIS — Z923 Personal history of irradiation: Secondary | ICD-10-CM | POA: Insufficient documentation

## 2022-02-11 DIAGNOSIS — J479 Bronchiectasis, uncomplicated: Secondary | ICD-10-CM | POA: Diagnosis not present

## 2022-02-11 DIAGNOSIS — R1310 Dysphagia, unspecified: Secondary | ICD-10-CM | POA: Insufficient documentation

## 2022-02-11 LAB — CBC WITH DIFFERENTIAL (CANCER CENTER ONLY)
Abs Immature Granulocytes: 0.01 10*3/uL (ref 0.00–0.07)
Basophils Absolute: 0 10*3/uL (ref 0.0–0.1)
Basophils Relative: 0 %
Eosinophils Absolute: 0.1 10*3/uL (ref 0.0–0.5)
Eosinophils Relative: 2 %
HCT: 35.9 % — ABNORMAL LOW (ref 39.0–52.0)
Hemoglobin: 11.8 g/dL — ABNORMAL LOW (ref 13.0–17.0)
Immature Granulocytes: 0 %
Lymphocytes Relative: 15 %
Lymphs Abs: 0.8 10*3/uL (ref 0.7–4.0)
MCH: 26.6 pg (ref 26.0–34.0)
MCHC: 32.9 g/dL (ref 30.0–36.0)
MCV: 81 fL (ref 80.0–100.0)
Monocytes Absolute: 0.7 10*3/uL (ref 0.1–1.0)
Monocytes Relative: 14 %
Neutro Abs: 3.4 10*3/uL (ref 1.7–7.7)
Neutrophils Relative %: 69 %
Platelet Count: 185 10*3/uL (ref 150–400)
RBC: 4.43 MIL/uL (ref 4.22–5.81)
RDW: 17 % — ABNORMAL HIGH (ref 11.5–15.5)
WBC Count: 5 10*3/uL (ref 4.0–10.5)
nRBC: 0 % (ref 0.0–0.2)

## 2022-02-11 LAB — CMP (CANCER CENTER ONLY)
ALT: 9 U/L (ref 0–44)
AST: 9 U/L — ABNORMAL LOW (ref 15–41)
Albumin: 3.5 g/dL (ref 3.5–5.0)
Alkaline Phosphatase: 63 U/L (ref 38–126)
Anion gap: 4 — ABNORMAL LOW (ref 5–15)
BUN: 9 mg/dL (ref 8–23)
CO2: 30 mmol/L (ref 22–32)
Calcium: 8.6 mg/dL — ABNORMAL LOW (ref 8.9–10.3)
Chloride: 103 mmol/L (ref 98–111)
Creatinine: 0.67 mg/dL (ref 0.61–1.24)
GFR, Estimated: >60 mL/min (ref >60)
Glucose, Bld: 158 mg/dL — ABNORMAL HIGH (ref 70–99)
Potassium: 3.9 mmol/L (ref 3.5–5.1)
Sodium: 137 mmol/L (ref 135–145)
Total Bilirubin: 0.5 mg/dL (ref 0.3–1.2)
Total Protein: 6.8 g/dL (ref 6.5–8.1)

## 2022-02-11 MED ORDER — IOHEXOL 300 MG/ML  SOLN
75.0000 mL | Freq: Once | INTRAMUSCULAR | Status: AC | PRN
  Administered 2022-02-11: 75 mL via INTRAVENOUS

## 2022-02-11 MED ORDER — HEPARIN SOD (PORK) LOCK FLUSH 100 UNIT/ML IV SOLN
INTRAVENOUS | Status: AC
  Filled 2022-02-11: qty 5

## 2022-02-11 MED ORDER — SODIUM CHLORIDE 0.9% FLUSH
10.0000 mL | Freq: Once | INTRAVENOUS | Status: AC
  Administered 2022-02-11: 10 mL

## 2022-02-11 MED ORDER — HEPARIN SOD (PORK) LOCK FLUSH 100 UNIT/ML IV SOLN
500.0000 [IU] | Freq: Once | INTRAVENOUS | Status: AC
  Administered 2022-02-11: 500 [IU] via INTRAVENOUS

## 2022-02-11 MED ORDER — SODIUM CHLORIDE (PF) 0.9 % IJ SOLN
INTRAMUSCULAR | Status: AC
  Filled 2022-02-11: qty 50

## 2022-02-13 ENCOUNTER — Inpatient Hospital Stay: Payer: Medicare Other | Admitting: Internal Medicine

## 2022-02-13 ENCOUNTER — Other Ambulatory Visit: Payer: Self-pay

## 2022-02-13 VITALS — BP 129/80 | HR 95 | Temp 98.7°F | Resp 18 | Wt 264.0 lb

## 2022-02-13 DIAGNOSIS — C3490 Malignant neoplasm of unspecified part of unspecified bronchus or lung: Secondary | ICD-10-CM

## 2022-02-13 DIAGNOSIS — I700 Atherosclerosis of aorta: Secondary | ICD-10-CM | POA: Diagnosis not present

## 2022-02-13 DIAGNOSIS — Z923 Personal history of irradiation: Secondary | ICD-10-CM | POA: Diagnosis not present

## 2022-02-13 DIAGNOSIS — R059 Cough, unspecified: Secondary | ICD-10-CM | POA: Diagnosis not present

## 2022-02-13 DIAGNOSIS — C3411 Malignant neoplasm of upper lobe, right bronchus or lung: Secondary | ICD-10-CM | POA: Diagnosis not present

## 2022-02-13 DIAGNOSIS — E039 Hypothyroidism, unspecified: Secondary | ICD-10-CM | POA: Diagnosis not present

## 2022-02-13 DIAGNOSIS — Z79899 Other long term (current) drug therapy: Secondary | ICD-10-CM | POA: Diagnosis not present

## 2022-02-13 DIAGNOSIS — R1310 Dysphagia, unspecified: Secondary | ICD-10-CM | POA: Diagnosis not present

## 2022-02-13 DIAGNOSIS — E785 Hyperlipidemia, unspecified: Secondary | ICD-10-CM | POA: Diagnosis not present

## 2022-02-13 DIAGNOSIS — E119 Type 2 diabetes mellitus without complications: Secondary | ICD-10-CM | POA: Diagnosis not present

## 2022-02-13 DIAGNOSIS — Z79890 Hormone replacement therapy (postmenopausal): Secondary | ICD-10-CM | POA: Diagnosis not present

## 2022-02-13 NOTE — Progress Notes (Signed)
[place] [facility] [phone]   [phone]  OFFICE PROGRESS NOTE  [name], MD [place] [address]  DIAGNOSIS: Stage IIIA (T4, N0, M0) non-small cell lung cancer, squamous cell carcinoma presented with large right upper lobe lung mass diagnosed in October 2021.   PRIOR THERAPY:  1) Concurrent chemoradiation with weekly carboplatin for AUC of 2 and paclitaxel 45 mg/M2. Status post 6 cycles. First dose on 02/27/20.  Last dose was given April 02, 2020. 2) Consolidation treatment with immunotherapy with Imfinzi 1500 mg IV every 4 weeks.  First dose May 10, 2020.  Status post 13 cycles.  CURRENT THERAPY: Observation  INTERVAL HISTORY: Billy Hall 76 y.o. male returns to the clinic today for follow-up visit.  The patient is feeling fine today with no concerning complaints.  He was treated for pneumonia by Dr. Elsworth Soho recently.  He denied having any current chest pain, shortness of breath but continues to have mild cough with no hemoptysis.  He has no nausea, vomiting, diarrhea or constipation.  He has no headache or visual changes.  He is here today for evaluation with repeat CT scan of the chest for restaging of his disease.  MEDICAL HISTORY: Past Medical History:  Diagnosis Date   Arthritis    Depressive disorder    Diabetes mellitus without complication ([facility])    Fatigue    Headache(784.0)    Hyperlipidemia    Hypertension    Hypothyroidism    Snoring     ALLERGIES:  is allergic to ace inhibitors.  MEDICATIONS:  Current Outpatient Medications  Medication Sig Dispense Refill   acetaminophen (TYLENOL) 650 MG CR tablet Take 650 mg by mouth every 8 (eight) hours as needed for pain.     albuterol (VENTOLIN HFA) 108 (90 Base) MCG/ACT inhaler Inhale 2 puffs into the lungs every 6 (six) hours as needed for wheezing or shortness of breath. 8 g 2   alfuzosin (UROXATRAL) 10 MG 24 hr tablet Take 1 tablet (10 mg total) by  mouth daily. 30 tablet 11   aspirin EC 81 MG tablet Take 81 mg by mouth daily.     cholecalciferol (VITAMIN D3) 25 MCG (1000 UNIT) tablet Take 1,000 Units by mouth daily.     levothyroxine (SYNTHROID) 100 MCG tablet Take 100 mcg by mouth daily.     losartan (COZAAR) 50 MG tablet Take 50 mg by mouth daily.      metFORMIN (GLUCOPHAGE) 500 MG tablet Take 500 mg by mouth 2 (two) times daily with a meal.      ONETOUCH ULTRA test strip 1 each 2 (two) times daily.     sildenafil (REVATIO) 20 MG tablet Take 20 mg by mouth daily as needed (ED).      simvastatin (ZOCOR) 10 MG tablet Take 10 mg by mouth every evening.      No current facility-administered medications for this visit.    SURGICAL HISTORY:  Past Surgical History:  Procedure Laterality Date   COLONOSCOPY N/A 03/08/2013   Procedure: COLONOSCOPY;  Surgeon: Jamesetta So, MD;  [facility];  Service: Gastroenterology;  Laterality: N/A;   IR IMAGING GUIDED PORT INSERTION  03/08/2020   IR US GUIDE BX ASP/DRAIN  03/08/2020   KNEE ARTHROSCOPY Right    2006 By Dr. Luna Glasgow at [facility].    THYROID SURGERY     VIDEO BRONCHOSCOPY WITH ENDOBRONCHIAL ULTRASOUND N/A 01/30/2020   Procedure: VIDEO BRONCHOSCOPY WITH ENDOBRONCHIAL ULTRASOUND;  Surgeon:  Melrose Nakayama, MD;  Location: [facility] OR;  Service: Thoracic;  Laterality: N/A;    REVIEW OF SYSTEMS:  A comprehensive review of systems was negative except for: Respiratory: positive for cough   PHYSICAL EXAMINATION: General appearance: alert, cooperative, and no distress Head: Normocephalic, without obvious abnormality, atraumatic Neck: no adenopathy, no JVD, supple, symmetrical, trachea midline, and thyroid not enlarged, symmetric, no tenderness/mass/nodules Lymph nodes: Cervical, supraclavicular, and axillary nodes normal. Resp: clear to auscultation bilaterally Back: symmetric, no curvature. ROM normal. No CVA tenderness. Cardio: regular rate and rhythm, S1, S2 normal, no murmur, click,  rub or gallop GI: soft, non-tender; bowel sounds normal; no masses,  no organomegaly Extremities: extremities normal, atraumatic, no cyanosis or edema  ECOG PERFORMANCE STATUS: 1 - Symptomatic but completely ambulatory  Blood pressure 129/80, pulse 95, temperature 98.7 F (37.1 C), temperature source Oral, resp. rate 18, weight 264 lb (119.7 kg), SpO2 97 %.  LABORATORY DATA: Lab Results  Component Value Date   WBC 5.0 02/11/2022   HGB 11.8 (L) 02/11/2022   HCT 35.9 (L) 02/11/2022   MCV 81.0 02/11/2022   PLT 185 02/11/2022      Chemistry      Component Value Date/Time   NA 137 02/11/2022 1126   K 3.9 02/11/2022 1126   CL 103 02/11/2022 1126   CO2 30 02/11/2022 1126   BUN 9 02/11/2022 1126   CREATININE 0.67 02/11/2022 1126      Component Value Date/Time   CALCIUM 8.6 (L) 02/11/2022 1126   ALKPHOS 63 02/11/2022 1126   AST 9 (L) 02/11/2022 1126   ALT 9 02/11/2022 1126   BILITOT 0.5 02/11/2022 1126       RADIOGRAPHIC STUDIES: CT Chest W Contrast  Result Date: 02/12/2022 CLINICAL DATA:  Non-small cell lung cancer.  * Tracking Code: BO * EXAM: CT CHEST WITH CONTRAST TECHNIQUE: Multidetector CT imaging of the chest was performed during intravenous contrast administration. RADIATION DOSE REDUCTION: This exam was performed according to the departmental dose-optimization program which includes automated exposure control, adjustment of the mA and/or kV according to patient size and/or use of iterative reconstruction technique. CONTRAST:  27mL OMNIPAQUE IOHEXOL 300 MG/ML  SOLN COMPARISON:  01/13/2022 and 01/09/2020. FINDINGS: Cardiovascular: Right IJ Port-A-Cath terminates in the high right atrium. Atherosclerotic calcification of the aorta and coronary arteries. Marked enlargement of the pulmonic trunk. Heart is at the upper limits of normal in size to mildly enlarged. No pericardial effusion. Mediastinum/Nodes: No pathologically enlarged mediastinal, hilar or axillary lymph nodes.  Subcarinal lymph nodes are not enlarged by CT size criteria. Esophagus is grossly unremarkable. Lungs/Pleura: Post treatment consolidation, cavitation, bronchiectasis and volume loss in the right upper lobe and right perihilar region, unchanged. Minimal residual associated fluid in the apex of the right hemithorax. Trace right pleural fluid. Debris is seen in the airway. Upper Abdomen: Visualized portions of the liver, gallbladder and adrenal glands are unremarkable. 1.6 cm low-attenuation lesion in the upper pole right kidney, stable from baseline 01/09/2020. No specific follow-up necessary other than on routine imaging. Visualized portions of the kidneys, spleen, pancreas, stomach and bowel are grossly unremarkable. Musculoskeletal: Degenerative changes in the spine. Old lateral right rib fractures. No worrisome lytic or sclerotic lesions. IMPRESSION: 1. Post treatment scarring and cavitation in the upper right hemithorax with minimal residual associated fluid. Findings may reflect resolving infection superimposed on post treatment scarring. No definitive evidence of recurrent or metastatic disease. 2. Trace right pleural fluid. 3. Aortic atherosclerosis (ICD10-I70.0). Coronary artery calcification. 4. Marked  enlargement of the pulmonic trunk, indicative of pulmonary arterial hypertension. Electronically Signed   By: Lorin Picket M.D.   On: 02/12/2022 14:34    ASSESSMENT AND PLAN: This is a very pleasant 76 years old African-American male recently diagnosed with a stage IIIA non-small cell lung cancer, squamous cell carcinoma presented with large right upper lobe lung mass in October 2021. The patient underwent a course of concurrent chemoradiation with weekly carboplatin and paclitaxel status post 6 cycles.  The patient tolerated this course of treatment fairly well except for mild odynophagia. Imaging studies after the induction phase of concurrent chemoradiation showed interval decrease in the size and  significant central cavitation of the previously demonstrated right upper lobe lung mass consistent with response to therapy. The patient underwent a course of consolidation treatment with immunotherapy with Imfinzi 1500 mg IV every 4 weeks status post 13 cycles.   The patient is currently on observation and he is feeling fine with no concerning complaints except for mild cough after his recent diagnosis with pneumonia that was treated by Dr. Elsworth Soho. He had repeat CT scan of the chest performed recently.  I personally and independently reviewed the scan and discussed the result with the patient today. His scan showed no concerning findings for disease progression. I recommended for him to continue on observation with repeat CT scan of the chest in 3 months. The patient was advised to call immediately if he has any other concerning symptoms in the interval. The patient voices understanding of current disease status and treatment options and is in agreement with the current care plan.  All questions were answered. The patient knows to call the clinic with any problems, questions or concerns. We can certainly see the patient much sooner if necessary.   Disclaimer: This note was dictated with voice recognition software. Similar sounding words can inadvertently be transcribed and may not be corrected upon review.

## 2022-02-14 ENCOUNTER — Encounter: Payer: Self-pay | Admitting: Internal Medicine

## 2022-02-14 ENCOUNTER — Telehealth: Payer: Self-pay | Admitting: Internal Medicine

## 2022-02-14 NOTE — Telephone Encounter (Signed)
Spoke with patient confirming 2/14 appointments

## 2022-02-18 ENCOUNTER — Telehealth: Payer: Self-pay | Admitting: Medical Oncology

## 2022-02-18 NOTE — Telephone Encounter (Signed)
Requests appt.  

## 2022-02-20 ENCOUNTER — Ambulatory Visit: Payer: Medicare Other | Admitting: Urology

## 2022-02-20 ENCOUNTER — Encounter: Payer: Self-pay | Admitting: Urology

## 2022-02-20 VITALS — BP 127/78 | HR 76 | Ht 69.0 in | Wt 264.0 lb

## 2022-02-20 DIAGNOSIS — N401 Enlarged prostate with lower urinary tract symptoms: Secondary | ICD-10-CM

## 2022-02-20 DIAGNOSIS — Z87440 Personal history of urinary (tract) infections: Secondary | ICD-10-CM | POA: Diagnosis not present

## 2022-02-20 DIAGNOSIS — R310 Gross hematuria: Secondary | ICD-10-CM

## 2022-02-20 DIAGNOSIS — N2889 Other specified disorders of kidney and ureter: Secondary | ICD-10-CM

## 2022-02-20 DIAGNOSIS — N138 Other obstructive and reflux uropathy: Secondary | ICD-10-CM | POA: Diagnosis not present

## 2022-02-20 NOTE — Progress Notes (Signed)
Assessment: 1. Gross hematuria - likely associated with UTI   2. History of UTI   3. BPH with obstruction/lower urinary tract symptoms   4. Left renal mass     Plan: I again discussed the results of the CT study from 01/13/2022 showing a 3 cm left lower pole renal mass.  Possible diagnoses including primary renal cell carcinoma, benign renal tumors, and metastatic disease.   I recommended further evaluation of the renal mass with a MRI with and without contrast.  He reports difficulty with MRIs previously due to claustrophobia.  He has previously tolerated an open MRI. Continue alfuzosin 10 mg daily  MRI abdomen with and without contrast at [place] in open MRI for evaluation of left renal mass Return to office in 3 months.    Chief Complaint:  Chief Complaint  Patient presents with   Benign Prostatic Hypertrophy   Hematuria    History of Present Illness:  Billy Hall is a 76 y.o. year old male who is seen for further evaluation of gross hematuria, history of UTI, BPH, and left renal mass. He presented to the emergency room on 10/12/2021 with symptoms of frequency, dysuria, gross hematuria, and right back discomfort.  Symptoms were present for approximately 24 hours.  Urinalysis showed >50 RBCs, >50 WBCs.  Urine culture grew >100 K E. coli, pansensitive.  CT abdomen and pelvis without contrast showed no hydronephrosis or hydroureter, a 4 mm stone in the lower pole of the right kidney and a 3 mm stone in the lower pole of the left kidney.  He was treated with cephalexin.  His symptoms resolved.  He had onset of similar symptoms with dysuria and gross hematuria in July 2023.  This was treated by Dr. Hilma Favors.  No fevers or chills associated with these episodes. He was not having any dysuria or gross hematuria at his visit in July 2023.  He reported frequency and urgency.  No prior history of UTIs or gross hematuria.  No flank pain. Urinalysis showed no evidence of  UTI.  He was previously seen in September 2022 for evaluation of a penile lesion.  He noted an area of irritation following condom use.  Examination showed no obvious penile lesions.  DRE demonstrated a 40 g prostate without nodularity or tenderness.   He has completed chemoradiation for treatment of lung cancer.  He developed symptoms similar to his prior UTI episodes in early August 2023.  Urinalysis showed >30 WBCs.  Urine culture grew 50-100 K E. coli.  He was treated with Bactrim. He noted improvement in his urinary symptoms following treatment of the UTI.  No further dysuria or recent gross hematuria.  He continued with some frequency.  He continued on tamsulosin. IPSS = 8. Cystoscopy from 8/23 showed lateral lobe enlargement of prostate, bladder trabeculations.  No papillary lesions seen. CT abdomen and pelvis with contrast from 01/13/22 showed an indeterminate mass involving the lower pole of the left kidney which demonstrates heterogeneous enhancement and measures up to 3.8 x 3.1 cm.  At his visit and 9/23, he continued on tamsulosin 0.4 mg daily.  No further episodes of gross hematuria.  No UTI symptoms.  He continued to with lower urinary tract symptoms including urgency, frequency, sensation of incomplete emptying, decreased stream, and nocturia x3.  No dysuria. IPSS = 18. He was changed to alfuzosin 10 mg daily.  He returns today for follow-up.  He reports improvement in his urinary symptoms with the alfuzosin.  He has had  decreased nocturia.  He continues with frequency and urgency.  He has noted improved stream and feels like he is emptying more completely.  No dysuria or gross hematuria. IPSS = 12 today.   Portions of the above documentation were copied from a prior visit for review purposes only.   Past Medical History:  Past Medical History:  Diagnosis Date   Arthritis    Depressive disorder    Diabetes mellitus without complication (HCC)    Fatigue    Headache(784.0)     Hyperlipidemia    Hypertension    Hypothyroidism    Snoring     Past Surgical History:  Past Surgical History:  Procedure Laterality Date   COLONOSCOPY N/A 03/08/2013   Procedure: COLONOSCOPY;  Surgeon: Jamesetta So, MD;  [facility];  Service: Gastroenterology;  Laterality: N/A;   IR IMAGING GUIDED PORT INSERTION  03/08/2020   IR US GUIDE BX ASP/DRAIN  03/08/2020   KNEE ARTHROSCOPY Right    2006 By Dr. Luna Glasgow at [facility].    THYROID SURGERY     VIDEO BRONCHOSCOPY WITH ENDOBRONCHIAL ULTRASOUND N/A 01/30/2020   Procedure: VIDEO BRONCHOSCOPY WITH ENDOBRONCHIAL ULTRASOUND;  Surgeon: Melrose Nakayama, MD;  Location: [place];  Service: Thoracic;  Laterality: N/A;    Allergies:  Allergies  Allergen Reactions   Ace Inhibitors Swelling and Rash    Family History:  Family History  Problem Relation Age of Onset   Alzheimer's disease Mother     Social History:  Social History   Tobacco Use   Smoking status: Former   Smokeless tobacco: Never   Tobacco comments:    Quit 2004  Vaping Use   Vaping Use: Never used  Substance Use Topics   Alcohol use: Not Currently    Alcohol/week: 0.0 standard drinks of alcohol    Comment: has quit since 2001   Drug use: No    ROS: Constitutional:  Negative for fever, chills, weight loss CV: Negative for chest pain, previous MI, hypertension Respiratory:  Negative for shortness of breath, wheezing, sleep apnea, frequent cough GI:  Negative for nausea, vomiting, bloody stool, GERD  Physical exam: BP 127/78   Pulse 76   Ht 5\' 9"  (1.753 m)   Wt 264 lb (119.7 kg)   BMI 38.99 kg/m  GENERAL APPEARANCE:  Well appearing, well developed, well nourished, NAD HEENT:  Atraumatic, normocephalic, oropharynx clear NECK:  Supple without lymphadenopathy or thyromegaly ABDOMEN:  Soft, non-tender, no masses EXTREMITIES:  Moves all extremities well, without clubbing, cyanosis, or edema NEUROLOGIC:  Alert and oriented x 3, normal gait, CN  II-XII grossly intact MENTAL STATUS:  appropriate BACK:  Non-tender to palpation, No CVAT SKIN:  Warm, dry, and intact  Results: U/A: 0-5 WBCs, 0-2 RBCs

## 2022-02-21 LAB — URINALYSIS, ROUTINE W REFLEX MICROSCOPIC
Bilirubin, UA: NEGATIVE
Glucose, UA: NEGATIVE
Ketones, UA: NEGATIVE
Leukocytes,UA: NEGATIVE
Nitrite, UA: NEGATIVE
Protein,UA: NEGATIVE
Specific Gravity, UA: 1.02 (ref 1.005–1.030)
Urobilinogen, Ur: 1 mg/dL (ref 0.2–1.0)
pH, UA: 6 (ref 5.0–7.5)

## 2022-02-21 LAB — MICROSCOPIC EXAMINATION: Bacteria, UA: NONE SEEN

## 2022-03-17 DIAGNOSIS — E039 Hypothyroidism, unspecified: Secondary | ICD-10-CM | POA: Diagnosis not present

## 2022-03-17 DIAGNOSIS — E782 Mixed hyperlipidemia: Secondary | ICD-10-CM | POA: Diagnosis not present

## 2022-03-17 DIAGNOSIS — E7849 Other hyperlipidemia: Secondary | ICD-10-CM | POA: Diagnosis not present

## 2022-03-17 DIAGNOSIS — E118 Type 2 diabetes mellitus with unspecified complications: Secondary | ICD-10-CM | POA: Diagnosis not present

## 2022-03-17 DIAGNOSIS — E559 Vitamin D deficiency, unspecified: Secondary | ICD-10-CM | POA: Diagnosis not present

## 2022-03-17 DIAGNOSIS — C3411 Malignant neoplasm of upper lobe, right bronchus or lung: Secondary | ICD-10-CM | POA: Diagnosis not present

## 2022-03-17 DIAGNOSIS — I10 Essential (primary) hypertension: Secondary | ICD-10-CM | POA: Diagnosis not present

## 2022-03-17 DIAGNOSIS — J449 Chronic obstructive pulmonary disease, unspecified: Secondary | ICD-10-CM | POA: Diagnosis not present

## 2022-03-17 DIAGNOSIS — E1165 Type 2 diabetes mellitus with hyperglycemia: Secondary | ICD-10-CM | POA: Diagnosis not present

## 2022-03-24 ENCOUNTER — Other Ambulatory Visit: Payer: Self-pay | Admitting: *Deleted

## 2022-03-24 ENCOUNTER — Other Ambulatory Visit: Payer: Self-pay | Admitting: Medical Oncology

## 2022-03-24 ENCOUNTER — Other Ambulatory Visit: Payer: Self-pay

## 2022-03-24 DIAGNOSIS — Z95828 Presence of other vascular implants and grafts: Secondary | ICD-10-CM

## 2022-03-25 ENCOUNTER — Ambulatory Visit
Admission: RE | Admit: 2022-03-25 | Discharge: 2022-03-25 | Disposition: A | Discharge status: Home / Self Care | Payer: Medicare Other | Source: Ambulatory Visit | Attending: Urology | Admitting: Urology

## 2022-03-25 DIAGNOSIS — Z95828 Presence of other vascular implants and grafts: Secondary | ICD-10-CM

## 2022-03-25 DIAGNOSIS — N2889 Other specified disorders of kidney and ureter: Secondary | ICD-10-CM | POA: Diagnosis not present

## 2022-03-25 MED ORDER — HEPARIN SOD (PORK) LOCK FLUSH 100 UNIT/ML IV SOLN
500.0000 [IU] | Freq: Once | INTRAVENOUS | Status: AC
  Administered 2022-03-25: 500 [IU] via INTRAVENOUS

## 2022-03-25 MED ORDER — SODIUM CHLORIDE 0.9% FLUSH
10.0000 mL | INTRAVENOUS | Status: DC | PRN
  Administered 2022-03-25: 10 mL via INTRAVENOUS

## 2022-03-25 MED ORDER — GADOPICLENOL 0.5 MMOL/ML IV SOLN
10.0000 mL | Freq: Once | INTRAVENOUS | Status: AC | PRN
  Administered 2022-03-25: 10 mL via INTRAVENOUS

## 2022-04-07 ENCOUNTER — Encounter: Payer: Self-pay | Admitting: Urology

## 2022-04-07 ENCOUNTER — Ambulatory Visit: Payer: Medicare Other | Admitting: Urology

## 2022-04-07 VITALS — BP 138/78 | HR 94

## 2022-04-07 DIAGNOSIS — N2889 Other specified disorders of kidney and ureter: Secondary | ICD-10-CM | POA: Diagnosis not present

## 2022-04-07 DIAGNOSIS — K8689 Other specified diseases of pancreas: Secondary | ICD-10-CM

## 2022-04-07 LAB — MICROSCOPIC EXAMINATION: Epithelial Cells (non renal): NONE SEEN /hpf (ref 0–10)

## 2022-04-07 LAB — URINALYSIS, ROUTINE W REFLEX MICROSCOPIC
Bilirubin, UA: NEGATIVE
Glucose, UA: NEGATIVE
Ketones, UA: NEGATIVE
Leukocytes,UA: NEGATIVE
Nitrite, UA: NEGATIVE
Protein,UA: NEGATIVE
Specific Gravity, UA: 1.015 (ref 1.005–1.030)
Urobilinogen, Ur: 4 mg/dL — ABNORMAL HIGH (ref 0.2–1.0)
pH, UA: 6.5 (ref 5.0–7.5)

## 2022-04-07 NOTE — Patient Instructions (Signed)

## 2022-04-07 NOTE — Progress Notes (Signed)
04/07/2022 10:56 AM   [name] Nov 25, 1945 [id number]  Referring provider: [name], MD [address] [place],  [place] [address]  Followup left renal mass   HPI: Mr Cicio is a 76yo here for followup for a left renal mass. He underwent MRI whoch showed a cystic left renal mass 4cm consistent with malignancy. He was also found to have a 2.5 cystic pancreatic mass. No flank pain. No recent hematuria   PMH: Past Medical History:  Diagnosis Date   Arthritis    Depressive disorder    Diabetes mellitus without complication (HCC)    Fatigue    Headache(784.0)    Hyperlipidemia    Hypertension    Hypothyroidism    Snoring     Surgical History: Past Surgical History:  Procedure Laterality Date   COLONOSCOPY N/A 03/08/2013   Procedure: COLONOSCOPY;  Surgeon: Jamesetta So, MD;  [facility];  Service: Gastroenterology;  Laterality: N/A;   IR IMAGING GUIDED PORT INSERTION  03/08/2020   IR US GUIDE BX ASP/DRAIN  03/08/2020   KNEE ARTHROSCOPY Right    2006 By Dr. Luna Glasgow at [facility].    THYROID SURGERY     VIDEO BRONCHOSCOPY WITH ENDOBRONCHIAL ULTRASOUND N/A 01/30/2020   Procedure: VIDEO BRONCHOSCOPY WITH ENDOBRONCHIAL ULTRASOUND;  Surgeon: Melrose Nakayama, MD;  Location: [place];  Service: Thoracic;  Laterality: N/A;    Home Medications:  Allergies as of 04/07/2022       Reactions   Ace Inhibitors Swelling, Rash        Medication List        Accurate as of April 07, 2022 10:56 AM. If you have any questions, ask your nurse or doctor.          acetaminophen 650 MG CR tablet Commonly known as: TYLENOL Take 650 mg by mouth every 8 (eight) hours as needed for pain.   albuterol 108 (90 Base) MCG/ACT inhaler Commonly known as: VENTOLIN HFA Inhale 2 puffs into the lungs every 6 (six) hours as needed for wheezing or shortness of breath.   alfuzosin 10 MG 24 hr tablet Commonly known as: UROXATRAL Take 1 tablet (10 mg total) by  mouth daily.   aspirin EC 81 MG tablet Take 81 mg by mouth daily.   cholecalciferol 25 MCG (1000 UNIT) tablet Commonly known as: VITAMIN D3 Take 1,000 Units by mouth daily.   levothyroxine 100 MCG tablet Commonly known as: SYNTHROID Take 100 mcg by mouth daily.   losartan 50 MG tablet Commonly known as: COZAAR Take 50 mg by mouth daily.   metFORMIN 500 MG tablet Commonly known as: GLUCOPHAGE Take 500 mg by mouth 2 (two) times daily with a meal.   OneTouch Ultra test strip Generic drug: glucose blood 1 each 2 (two) times daily.   sildenafil 20 MG tablet Commonly known as: REVATIO Take 20 mg by mouth daily as needed (ED).   simvastatin 10 MG tablet Commonly known as: ZOCOR Take 10 mg by mouth every evening.        Allergies:  Allergies  Allergen Reactions   Ace Inhibitors Swelling and Rash    Family History: Family History  Problem Relation Age of Onset   Alzheimer's disease Mother     Social History:  reports that he has quit smoking. He has never used smokeless tobacco. He reports that he does not currently use alcohol. He reports that he does not use drugs.  ROS: All other review of systems were reviewed and are  negative except what is noted above in HPI  Physical Exam: BP 138/78   Pulse 94   Constitutional:  Alert and oriented, No acute distress. HEENT: [place] AT, moist mucus membranes.  Trachea midline, no masses. Cardiovascular: No clubbing, cyanosis, or edema. Respiratory: Normal respiratory effort, no increased work of breathing. GI: Abdomen is soft, nontender, nondistended, no abdominal masses GU: No CVA tenderness.  Lymph: No cervical or inguinal lymphadenopathy. Skin: No rashes, bruises or suspicious lesions. Neurologic: Grossly intact, no focal deficits, moving all 4 extremities. Psychiatric: Normal mood and affect.  Laboratory Data: Lab Results  Component Value Date   WBC 5.0 02/11/2022   HGB 11.8 (L) 02/11/2022   HCT 35.9 (L) 02/11/2022    MCV 81.0 02/11/2022   PLT 185 02/11/2022    Lab Results  Component Value Date   CREATININE 0.67 02/11/2022    No results found for: "PSA"  No results found for: "TESTOSTERONE"  Lab Results  Component Value Date   HGBA1C 6.8 (H) 01/13/2022    Urinalysis    Component Value Date/Time   COLORURINE AMBER (A) 01/13/2022 0728   APPEARANCEUR Clear 02/20/2022 1401   LABSPEC 1.020 01/13/2022 0728   PHURINE 6.0 01/13/2022 0728   GLUCOSEU Negative 02/20/2022 1401   HGBUR MODERATE (A) 01/13/2022 0728   BILIRUBINUR Negative 02/20/2022 1401   KETONESUR 5 (A) 01/13/2022 0728   PROTEINUR Negative 02/20/2022 1401   PROTEINUR 100 (A) 01/13/2022 0728   NITRITE Negative 02/20/2022 1401   NITRITE NEGATIVE 01/13/2022 0728   LEUKOCYTESUR Negative 02/20/2022 1401   LEUKOCYTESUR NEGATIVE 01/13/2022 0728    Lab Results  Component Value Date   LABMICR See below: 02/20/2022   WBCUA 0-5 02/20/2022   LABEPIT 0-10 02/20/2022   MUCUS Present 11/28/2021   BACTERIA None seen 02/20/2022    Pertinent Imaging: MRi 03/25/2022: Images reviewed and discussed with the patient No results found for this or any previous visit.  No results found for this or any previous visit.  No results found for this or any previous visit.  No results found for this or any previous visit.  No results found for this or any previous visit.  No valid procedures specified. No results found for this or any previous visit.  Results for orders placed during the hospital encounter of 10/12/21  CT Renal Stone Study  Narrative CLINICAL DATA:  Flank pain. Kidney stones suspected. Burning with urination, urinary frequency, and right flank pain starting yesterday. History of right upper lung cancer.  EXAM: CT ABDOMEN AND PELVIS WITHOUT CONTRAST  TECHNIQUE: Multidetector CT imaging of the abdomen and pelvis was performed following the standard protocol without IV contrast.  RADIATION DOSE REDUCTION: This exam  was performed according to the departmental dose-optimization program which includes automated exposure control, adjustment of the mA and/or kV according to patient size and/or use of iterative reconstruction technique.  COMPARISON:  PET CT 02/06/2020  FINDINGS: Lower chest: Lung bases are clear.  Hepatobiliary: No focal liver abnormality is seen. No gallstones, gallbladder wall thickening, or biliary dilatation.  Pancreas: Unremarkable. No pancreatic ductal dilatation or surrounding inflammatory changes.  Spleen: Normal in size without focal abnormality.  Adrenals/Urinary Tract: No adrenal gland nodules. Kidneys are symmetrical in size and shape. No hydronephrosis or hydroureter. There is a 4 mm stone in the lower pole right kidney and a 3 mm stone in the lower pole left kidney. No ureteral stones or bladder stones are demonstrated. The bladder is decompressed.  Stomach/Bowel: Stomach, small bowel, and colon are  not abnormally distended. No wall thickening or inflammatory changes. Diverticulosis throughout the colon without evidence of acute diverticulitis. Appendix is normal.  Vascular/Lymphatic: Scattered aortic calcifications. No significant vascular findings are present. No enlarged abdominal or pelvic lymph nodes.  Reproductive: Prostate gland is not enlarged.  Other: Small periumbilical hernia containing fat. No free air or free fluid in the abdomen.  Musculoskeletal: No acute or significant osseous findings.  IMPRESSION: Nonobstructing intrarenal stones are demonstrated bilaterally. No ureteral stone or obstruction. Mild aortic atherosclerosis.   Electronically Signed By: Lucienne Capers M.D. On: 10/12/2021 04:06   Assessment & Plan:    1. Left renal mass We discussed the natural hx of renal masses and the 80/20 malignant/benign likelihood. We disucssed the treatment options including active surveillance. Renal ablation, partial and radical nephrectomy.    Patient will be referred to General Surgery for a new cystic pancreatic mass - Urinalysis, Routine w reflex microscopic   No follow-ups on file.  Nicolette Bang, MD  [facility] [place]

## 2022-04-09 ENCOUNTER — Telehealth: Payer: Self-pay | Admitting: Medical Oncology

## 2022-04-09 NOTE — Telephone Encounter (Signed)
Can he take Mucinex  for 'coughin gup phlegm for a long time".  I told pt he has to take the heart healthy formula for a decongestant and to relieve chest congestion. Marland Kitchen

## 2022-04-10 DIAGNOSIS — C3411 Malignant neoplasm of upper lobe, right bronchus or lung: Secondary | ICD-10-CM | POA: Diagnosis not present

## 2022-04-10 DIAGNOSIS — J449 Chronic obstructive pulmonary disease, unspecified: Secondary | ICD-10-CM | POA: Diagnosis not present

## 2022-04-30 DIAGNOSIS — K862 Cyst of pancreas: Secondary | ICD-10-CM | POA: Insufficient documentation

## 2022-04-30 DIAGNOSIS — D379 Neoplasm of uncertain behavior of digestive organ, unspecified: Secondary | ICD-10-CM | POA: Diagnosis not present

## 2022-04-30 DIAGNOSIS — D490 Neoplasm of unspecified behavior of digestive system: Secondary | ICD-10-CM | POA: Diagnosis not present

## 2022-05-16 ENCOUNTER — Telehealth: Payer: Self-pay | Admitting: Internal Medicine

## 2022-05-16 NOTE — Telephone Encounter (Signed)
Called patient regarding upcoming February appointments, patient has been called and notified of upcoming appointments.

## 2022-05-26 ENCOUNTER — Telehealth: Payer: Self-pay | Admitting: *Deleted

## 2022-05-26 NOTE — Telephone Encounter (Signed)
Call received from Billy Hall son Billy Hall [phone]) requesting a "call to employer [employer] [phone]) to notify Billy Hall id still under provider's care to extend FMLA" completed last in October 2023.  "No I do not have or need a new claim form.  Billy Hall said a phone call is acceptable and also saves time and cost." Advised number provided is a fax.  Again denies need for new form.  "I am at work.  Will obtain her direct phone number and call you back."  Advised father is under observation with F/U every four months.  Request FMLA from active current provider if needed.   Noted office number [phone] on previous ROI for form signed 02/10/2022.  Message left for Billy Hall that Billy Hall continues to be a patient with [facility] assigned provider.   Voicemail received from [name] [phone]) that documentation is needed to say that about their employee's father may be faxed to no.# [phone] or emailed to [email or web address].  Message forwarded to collaborative and message left for [facility] with status of her request for this patient under surveillance currently seen every four months.  Provided fax no.# [phone] to send a new form if needed.

## 2022-05-27 ENCOUNTER — Telehealth: Payer: Self-pay | Admitting: Internal Medicine

## 2022-05-27 ENCOUNTER — Encounter: Payer: Self-pay | Admitting: Internal Medicine

## 2022-05-27 ENCOUNTER — Telehealth: Payer: Self-pay | Admitting: Medical Oncology

## 2022-05-27 ENCOUNTER — Encounter: Payer: Self-pay | Admitting: Medical Oncology

## 2022-05-27 NOTE — Telephone Encounter (Signed)
Letter faxed regarding pt is still under provider's care.

## 2022-05-27 NOTE — Telephone Encounter (Signed)
Rescheduled 02/20 appointment due to provider on-call. Patient has been called and notified.

## 2022-05-27 NOTE — Telephone Encounter (Signed)
Billy Hall notified that letter was faxed to her.

## 2022-06-11 ENCOUNTER — Inpatient Hospital Stay: Payer: Medicare Other | Attending: Internal Medicine

## 2022-06-11 ENCOUNTER — Ambulatory Visit: Payer: Medicare Other | Admitting: Internal Medicine

## 2022-06-11 ENCOUNTER — Ambulatory Visit (HOSPITAL_COMMUNITY)
Admission: RE | Admit: 2022-06-11 | Discharge: 2022-06-11 | Disposition: A | Discharge status: Home / Self Care | Payer: Medicare Other | Source: Ambulatory Visit | Attending: Internal Medicine | Admitting: Internal Medicine

## 2022-06-11 ENCOUNTER — Encounter: Payer: Self-pay | Admitting: Internal Medicine

## 2022-06-11 ENCOUNTER — Other Ambulatory Visit: Payer: Self-pay

## 2022-06-11 ENCOUNTER — Other Ambulatory Visit: Payer: Medicare Other

## 2022-06-11 DIAGNOSIS — Z79899 Other long term (current) drug therapy: Secondary | ICD-10-CM | POA: Insufficient documentation

## 2022-06-11 DIAGNOSIS — E039 Hypothyroidism, unspecified: Secondary | ICD-10-CM | POA: Diagnosis not present

## 2022-06-11 DIAGNOSIS — Z87891 Personal history of nicotine dependence: Secondary | ICD-10-CM | POA: Insufficient documentation

## 2022-06-11 DIAGNOSIS — J984 Other disorders of lung: Secondary | ICD-10-CM | POA: Insufficient documentation

## 2022-06-11 DIAGNOSIS — R0609 Other forms of dyspnea: Secondary | ICD-10-CM | POA: Insufficient documentation

## 2022-06-11 DIAGNOSIS — C3490 Malignant neoplasm of unspecified part of unspecified bronchus or lung: Secondary | ICD-10-CM | POA: Insufficient documentation

## 2022-06-11 DIAGNOSIS — Z95828 Presence of other vascular implants and grafts: Secondary | ICD-10-CM

## 2022-06-11 DIAGNOSIS — N2889 Other specified disorders of kidney and ureter: Secondary | ICD-10-CM | POA: Insufficient documentation

## 2022-06-11 DIAGNOSIS — K8689 Other specified diseases of pancreas: Secondary | ICD-10-CM | POA: Insufficient documentation

## 2022-06-11 DIAGNOSIS — E119 Type 2 diabetes mellitus without complications: Secondary | ICD-10-CM | POA: Diagnosis not present

## 2022-06-11 DIAGNOSIS — Z923 Personal history of irradiation: Secondary | ICD-10-CM | POA: Diagnosis not present

## 2022-06-11 DIAGNOSIS — R059 Cough, unspecified: Secondary | ICD-10-CM | POA: Diagnosis not present

## 2022-06-11 DIAGNOSIS — E785 Hyperlipidemia, unspecified: Secondary | ICD-10-CM | POA: Diagnosis not present

## 2022-06-11 DIAGNOSIS — R1310 Dysphagia, unspecified: Secondary | ICD-10-CM | POA: Insufficient documentation

## 2022-06-11 DIAGNOSIS — C3411 Malignant neoplasm of upper lobe, right bronchus or lung: Secondary | ICD-10-CM | POA: Insufficient documentation

## 2022-06-11 DIAGNOSIS — I700 Atherosclerosis of aorta: Secondary | ICD-10-CM | POA: Insufficient documentation

## 2022-06-11 DIAGNOSIS — I10 Essential (primary) hypertension: Secondary | ICD-10-CM | POA: Diagnosis not present

## 2022-06-11 LAB — CBC WITH DIFFERENTIAL (CANCER CENTER ONLY)
Abs Immature Granulocytes: 0.01 10*3/uL (ref 0.00–0.07)
Basophils Absolute: 0 10*3/uL (ref 0.0–0.1)
Basophils Relative: 1 %
Eosinophils Absolute: 0.1 10*3/uL (ref 0.0–0.5)
Eosinophils Relative: 2 %
HCT: 37.9 % — ABNORMAL LOW (ref 39.0–52.0)
Hemoglobin: 12.3 g/dL — ABNORMAL LOW (ref 13.0–17.0)
Immature Granulocytes: 0 %
Lymphocytes Relative: 22 %
Lymphs Abs: 1.3 10*3/uL (ref 0.7–4.0)
MCH: 25.4 pg — ABNORMAL LOW (ref 26.0–34.0)
MCHC: 32.5 g/dL (ref 30.0–36.0)
MCV: 78.3 fL — ABNORMAL LOW (ref 80.0–100.0)
Monocytes Absolute: 0.7 10*3/uL (ref 0.1–1.0)
Monocytes Relative: 12 %
Neutro Abs: 3.8 10*3/uL (ref 1.7–7.7)
Neutrophils Relative %: 63 %
Platelet Count: 225 10*3/uL (ref 150–400)
RBC: 4.84 MIL/uL (ref 4.22–5.81)
RDW: 18.3 % — ABNORMAL HIGH (ref 11.5–15.5)
WBC Count: 5.8 10*3/uL (ref 4.0–10.5)
nRBC: 0 % (ref 0.0–0.2)

## 2022-06-11 LAB — CMP (CANCER CENTER ONLY)
ALT: 9 U/L (ref 0–44)
AST: 9 U/L — ABNORMAL LOW (ref 15–41)
Albumin: 3.8 g/dL (ref 3.5–5.0)
Alkaline Phosphatase: 66 U/L (ref 38–126)
Anion gap: 5 (ref 5–15)
BUN: 17 mg/dL (ref 8–23)
CO2: 30 mmol/L (ref 22–32)
Calcium: 9.1 mg/dL (ref 8.9–10.3)
Chloride: 104 mmol/L (ref 98–111)
Creatinine: 0.79 mg/dL (ref 0.61–1.24)
GFR, Estimated: >60 mL/min (ref >60)
Glucose, Bld: 123 mg/dL — ABNORMAL HIGH (ref 70–99)
Potassium: 4 mmol/L (ref 3.5–5.1)
Sodium: 139 mmol/L (ref 135–145)
Total Bilirubin: 0.5 mg/dL (ref 0.3–1.2)
Total Protein: 7 g/dL (ref 6.5–8.1)

## 2022-06-11 MED ORDER — IOHEXOL 300 MG/ML  SOLN
75.0000 mL | Freq: Once | INTRAMUSCULAR | Status: AC | PRN
  Administered 2022-06-11: 75 mL via INTRAVENOUS

## 2022-06-11 MED ORDER — HEPARIN SOD (PORK) LOCK FLUSH 100 UNIT/ML IV SOLN
INTRAVENOUS | Status: AC
  Filled 2022-06-11: qty 5

## 2022-06-11 MED ORDER — HEPARIN SOD (PORK) LOCK FLUSH 100 UNIT/ML IV SOLN: 500.0000 [IU] | Freq: Once | INTRAVENOUS | Status: DC

## 2022-06-11 MED ORDER — SODIUM CHLORIDE 0.9% FLUSH
10.0000 mL | Freq: Once | INTRAVENOUS | Status: AC
  Administered 2022-06-11: 10 mL

## 2022-06-11 MED ORDER — SODIUM CHLORIDE (PF) 0.9 % IJ SOLN
INTRAMUSCULAR | Status: AC
  Filled 2022-06-11: qty 50

## 2022-06-11 MED ORDER — HEPARIN SOD (PORK) LOCK FLUSH 100 UNIT/ML IV SOLN
500.0000 [IU] | Freq: Once | INTRAVENOUS | Status: AC
  Administered 2022-06-11: 500 [IU] via INTRAVENOUS

## 2022-06-12 ENCOUNTER — Ambulatory Visit: Payer: Medicare Other | Admitting: Internal Medicine

## 2022-06-12 NOTE — Progress Notes (Signed)
[place] OFFICE PROGRESS NOTE  [name], MD [place] [address]  DIAGNOSIS:  1) Stage IIIA (T4, N0, M0) non-small cell lung cancer, squamous cell carcinoma presented with large right upper lobe lung mass diagnosed in October 2021.  2) Left renal mass, following urology in [place]: 1) Concurrent chemoradiation with weekly carboplatin for AUC of 2 and paclitaxel 45 mg/M2. Status post 6 cycles. First dose on 02/27/20.  Last dose was given April 02, 2020. 2) Consolidation treatment with immunotherapy with Imfinzi 1500 mg IV every 4 weeks.  First dose May 10, 2020.  Status post 13 cycles.  CURRENT THERAPY: Observation   INTERVAL HISTORY: Billy Hall 77 y.o. male returns to the clinic today for a 34-month follow-up visit.  The patient last saw Dr. Julien Nordmann on 02/13/2022.  The patient is currently on observation for his history of stage III lung cancer. In the interval since last being seen he did see Dr. Alyson Ingles from [facility] urology for a left-sided renal mass and he was referred to general surgery.  The patient tells me an MRI was done and they recommended follow up renal ultrasound in 6 months.   Otherwise regarding his history of lung cancer, today he denies any fever, chills, night sweats, or unexplained weight loss.  He mentions he has increased cough at night and increased dyspnea on exertion. He sees Dr. Elsworth Soho from pulmonary medicine. The patient has albuterol for rescue inhaler; however, he does not use it very often. Per Dr. Bari Mantis most recent note, the patient's PFTs show a more restrictive lung pattern which may be due to obesity. He also has some scarring from chemo/radiation. Of note, the patient has gained 10 lbs since this time. He reports he has a hard time with increased snacking at night time. He mentions he knows he needs to lose weight due to it also affecting his knees. He denies any chest pain or  hemoptysis. He denies any known heart disease.  Denies any nausea, vomiting, diarrhea, or constipation.  Denies any unusual bone pain besides his knees.  Denies any headache or visual changes.  He recently had a restaging CT scan performed.  He is here today for evaluation to review his scan results.   MEDICAL HISTORY: Past Medical History:  Diagnosis Date   Arthritis    Depressive disorder    Diabetes mellitus without complication (HCC)    Fatigue    Headache(784.0)    Hyperlipidemia    Hypertension    Hypothyroidism    Snoring     ALLERGIES:  is allergic to ace inhibitors.  MEDICATIONS:  Current Outpatient Medications  Medication Sig Dispense Refill   acetaminophen (TYLENOL) 650 MG CR tablet Take 650 mg by mouth every 8 (eight) hours as needed for pain.     albuterol (VENTOLIN HFA) 108 (90 Base) MCG/ACT inhaler Inhale 2 puffs into the lungs every 6 (six) hours as needed for wheezing or shortness of breath. 8 g 2   alfuzosin (UROXATRAL) 10 MG 24 hr tablet Take 1 tablet (10 mg total) by mouth daily. 30 tablet 11   aspirin EC 81 MG tablet Take 81 mg by mouth daily.     cholecalciferol (VITAMIN D3) 25 MCG (1000 UNIT) tablet Take 1,000 Units by mouth daily.     levothyroxine (SYNTHROID) 100 MCG tablet Take 100 mcg by mouth daily.     losartan (COZAAR) 50 MG tablet Take 50 mg by mouth daily.  metFORMIN (GLUCOPHAGE) 500 MG tablet Take 500 mg by mouth 2 (two) times daily with a meal.      ONETOUCH ULTRA test strip 1 each 2 (two) times daily.     sildenafil (REVATIO) 20 MG tablet Take 20 mg by mouth daily as needed (ED).      simvastatin (ZOCOR) 10 MG tablet Take 10 mg by mouth every evening.      No current facility-administered medications for this visit.    SURGICAL HISTORY:  Past Surgical History:  Procedure Laterality Date   COLONOSCOPY N/A 03/08/2013   Procedure: COLONOSCOPY;  Surgeon: Jamesetta So, MD;  Location: [facility];  Service: Gastroenterology;  Laterality:  N/A;   IR IMAGING GUIDED PORT INSERTION  03/08/2020   IR US GUIDE BX ASP/DRAIN  03/08/2020   KNEE ARTHROSCOPY Right    2006 By Dr. Luna Glasgow at [facility].    THYROID SURGERY     VIDEO BRONCHOSCOPY WITH ENDOBRONCHIAL ULTRASOUND N/A 01/30/2020   Procedure: VIDEO BRONCHOSCOPY WITH ENDOBRONCHIAL ULTRASOUND;  Surgeon: Melrose Nakayama, MD;  Location: [facility] OR;  Service: Thoracic;  Laterality: N/A;    REVIEW OF SYSTEMS:   Review of Systems  Constitutional: Negative for appetite change, chills, fatigue, fever and unexpected weight change.  HENT:   Negative for mouth sores, nosebleeds, sore throat and trouble swallowing.   Eyes: Negative for eye problems and icterus.  Respiratory: Positive for cough at night and dyspnea on exertion. Negative for hemoptysis and wheezing.   Cardiovascular: Negative for chest pain and leg swelling.  Gastrointestinal: Negative for abdominal pain, constipation, diarrhea, nausea and vomiting.  Genitourinary: Negative for bladder incontinence, difficulty urinating, dysuria, frequency and hematuria.   Musculoskeletal: Positive for knee pain. Negative for back pain, gait problem, neck pain and neck stiffness.  Skin: Negative for itching and rash.  Neurological: Negative for dizziness, extremity weakness, gait problem, headaches, light-headedness and seizures.  Hematological: Negative for adenopathy. Does not bruise/bleed easily.  Psychiatric/Behavioral: Negative for confusion, depression and sleep disturbance. The patient is not nervous/anxious.     PHYSICAL EXAMINATION:  There were no vitals taken for this visit.  ECOG PERFORMANCE STATUS: 1-2  Physical Exam  Constitutional: Oriented to person, place, and time and obese body habitus and in no distress.  HENT:  Head: Normocephalic and atraumatic.  Mouth/Throat: Oropharynx is clear and moist. No oropharyngeal exudate.  Eyes: Conjunctivae are normal. Right eye exhibits no discharge. Left eye exhibits no discharge. No  scleral icterus.  Neck: Normal range of motion. Neck supple.  Cardiovascular: Normal rate, regular rhythm, normal heart sounds and intact distal pulses.   Pulmonary/Chest: Effort normal and breath sounds normal. No respiratory distress. No wheezes. No rales.  Abdominal: Soft. Bowel sounds are normal. Exhibits no distension and no mass. There is no tenderness.  Musculoskeletal: Normal range of motion. Exhibits no edema.  Lymphadenopathy:    No cervical adenopathy.  Neurological: Alert and oriented to person, place, and time. Exhibits normal muscle tone. Gait normal. Coordination normal.  Skin: Skin is warm and dry. No rash noted. Not diaphoretic. No erythema. No pallor.  Psychiatric: Mood, memory and judgment normal.  Vitals reviewed.  LABORATORY DATA: Lab Results  Component Value Date   WBC 5.8 06/11/2022   HGB 12.3 (L) 06/11/2022   HCT 37.9 (L) 06/11/2022   MCV 78.3 (L) 06/11/2022   PLT 225 06/11/2022      Chemistry      Component Value Date/Time   NA 139 06/11/2022 1053   K 4.0 06/11/2022  1053   CL 104 06/11/2022 1053   CO2 30 06/11/2022 1053   BUN 17 06/11/2022 1053   CREATININE 0.79 06/11/2022 1053      Component Value Date/Time   CALCIUM 9.1 06/11/2022 1053   ALKPHOS 66 06/11/2022 1053   AST 9 (L) 06/11/2022 1053   ALT 9 06/11/2022 1053   BILITOT 0.5 06/11/2022 1053       RADIOGRAPHIC STUDIES:  CT Chest W Contrast  Result Date: 06/11/2022 CLINICAL DATA:  Staging non-small-cell lung cancer. Stage IIIA right upper lobe. Concurrent chemotherapy * Tracking Code: BO * EXAM: CT CHEST WITH CONTRAST TECHNIQUE: Multidetector CT imaging of the chest was performed during intravenous contrast administration. RADIATION DOSE REDUCTION: This exam was performed according to the departmental dose-optimization program which includes automated exposure control, adjustment of the mA and/or kV according to patient size and/or use of iterative reconstruction technique. CONTRAST:  89mL  OMNIPAQUE IOHEXOL 300 MG/ML  SOLN COMPARISON:  CT 02/11/2022 and older FINDINGS: Cardiovascular: Heart is nonenlarged. No pericardial effusion. Normal caliber thoracic aorta with some atherosclerotic plaque. There is a variant of the left vertebral artery originating directly from the aorta just proximal to the origin of the left subclavian artery. There is some enlargement of the main pulmonary artery. Please correlate for pulmonary artery hypertension. There is occlusion of upper lobe branch of the right pulmonary artery near its origin, unchanged from previous. Mediastinum/Nodes: No specific abnormal lymph node enlargement identified in the axillary region, hilum. There are some small mediastinal nodes identified, less than a cm in short axis and not pathologic by size criteria. Example right paratracheal node which had a short axis of 8 mm previously and long axis of 12 mm. Today this same node on series 2, image 30 measures 11 by 7 mm. Borderline subcarinal node on the right is also stable. Normal caliber thoracic esophagus. Atrophic thyroid gland. Lungs/Pleura: Left lung has some areas of peripheral septal thickening with scarring and fibrotic change. No consolidation, pneumothorax or effusion. Some volume loss. Breathing motion. No left-sided new lung nodule. The right lung once again has cavitary in the right lung apex with pleural thickening, consolidative opacity as well as some perihilar changes and density, unchanged from prior. Associated areas of pleural thickening, volume loss with scarring atelectatic change. No recurrent or new right-sided lung mass. Upper Abdomen: Elevation of the right hemidiaphragm. Stable nodular thickening of both adrenal glands. Atrophy of the pancreas. Colonic diverticula. Musculoskeletal: Scattered degenerative changes of the spine. IMPRESSION: Stable chest CT. Once again there is volume loss with opacity and cavitary changes of the right lung apex with pleural thickening  and scarring. Associated poor enhancement of the supplying pulmonary artery. Main and other central pulmonary arteries are dilated. No developing lymph node enlargement. Aortic Atherosclerosis (ICD10-I70.0). Electronically Signed   By: Jill Side M.D.   On: 06/11/2022 17:18     ASSESSMENT/PLAN:  This is a very pleasant 77 year old African-American male diagnosed with stage IIIa non-small cell lung cancer, squamous cell carcinoma.  He presented with a large right upper lobe lung mass in October 2021.  He underwent a course of concurrent chemoradiation with weekly carboplatin for an AUC of 2 and paclitaxel 45 mg/m.  He is status post 6 cycles.  He tolerated treatment well except for mild odynophagia.  He then underwent consolidation immunotherapy with Imfinzi 5000 mg IV every 4 weeks.  He is status post 13 cycles of treatment.  The patient has been on observation since that time and feeling  fine.  The patient recently had a restaging CT scan performed.  Dr. Julien Nordmann personally independently reviewed the scan discussed results with the patient today.  The scan did not show any evidence of disease progression.  Dr. Julien Nordmann recommends he continue on observation with repeat CT scan of the chest in 6 months?  He has a port-a-cath. I will arrange for him to have a flush appointment at the [facility] every 6-8 weeks.   He will continue to follow with urology for the renal mass.   Dr. Julien Nordmann and I encouraged the patient to exercise as that may help his breathing. No signs of infection on his CT scan. With mobility restrictions due to joint pain, I recommended pool exercises or stationary bikes. If no improvement, recommend follow up with PCP or pulmonary medicine. The patient states he has not smoked in 20 years. He denies any heart disease. If he needs cough suppressant before bed, discussed he may try either delsym or robitussin.   The patient was advised to call immediately if she has  any concerning symptoms in the interval. The patient voices understanding of current disease status and treatment options and is in agreement with the current care plan. All questions were answered. The patient knows to call the clinic with any problems, questions or concerns. We can certainly see the patient much sooner if necessary   No orders of the defined types were placed in this encounter.    Alexys Gassett L Kyira Volkert, PA-C 06/12/22  ADDENDUM: Hematology/Oncology Attending: I had a face-to-face encounter with the patient today.  I reviewed his record, lab, scan and recommended his care plan.  This is a very pleasant 77 years old African-American male with a stage IIIa (T4, N0, M0) non-small cell lung cancer, squamous cell carcinoma diagnosed in October 2021.  He status post a course of concurrent chemoradiation with weekly carboplatin and paclitaxel followed by 1 year of treatment with consolidation treatment with Imfinzi completed in December 2022.  The patient has been in observation for more than a year and he is feeling fine with no concerning complaints except for fatigue.  He also has a history of left renal mass followed by urology. He had repeat CT scan of the chest performed recently.  I personally and independently reviewed the scan and discussed the result with the patient today. His scan showed no concerning findings for disease recurrence or metastasis. I recommended for him to continue on observation with repeat CT scan of the chest in 6 months. The patient will follow with urology for his left renal mass. He was advised to call immediately if he has any other concerning symptoms in the interval. Disclaimer: This note was dictated with voice recognition software. Similar sounding words can inadvertently be transcribed and may be missed upon review. Eilleen Kempf, MD 06/16/22

## 2022-06-16 ENCOUNTER — Inpatient Hospital Stay: Payer: Medicare Other | Admitting: Physician Assistant

## 2022-06-16 ENCOUNTER — Other Ambulatory Visit: Payer: Self-pay

## 2022-06-16 VITALS — BP 128/72 | HR 88 | Temp 98.1°F | Resp 16 | Wt 273.4 lb

## 2022-06-16 DIAGNOSIS — I10 Essential (primary) hypertension: Secondary | ICD-10-CM | POA: Diagnosis not present

## 2022-06-16 DIAGNOSIS — E785 Hyperlipidemia, unspecified: Secondary | ICD-10-CM | POA: Diagnosis not present

## 2022-06-16 DIAGNOSIS — E039 Hypothyroidism, unspecified: Secondary | ICD-10-CM | POA: Diagnosis not present

## 2022-06-16 DIAGNOSIS — Z87891 Personal history of nicotine dependence: Secondary | ICD-10-CM | POA: Diagnosis not present

## 2022-06-16 DIAGNOSIS — E119 Type 2 diabetes mellitus without complications: Secondary | ICD-10-CM | POA: Diagnosis not present

## 2022-06-16 DIAGNOSIS — N2889 Other specified disorders of kidney and ureter: Secondary | ICD-10-CM | POA: Diagnosis not present

## 2022-06-16 DIAGNOSIS — I700 Atherosclerosis of aorta: Secondary | ICD-10-CM | POA: Diagnosis not present

## 2022-06-16 DIAGNOSIS — R059 Cough, unspecified: Secondary | ICD-10-CM | POA: Diagnosis not present

## 2022-06-16 DIAGNOSIS — R1310 Dysphagia, unspecified: Secondary | ICD-10-CM | POA: Diagnosis not present

## 2022-06-16 DIAGNOSIS — C3411 Malignant neoplasm of upper lobe, right bronchus or lung: Secondary | ICD-10-CM | POA: Diagnosis not present

## 2022-06-16 DIAGNOSIS — K8689 Other specified diseases of pancreas: Secondary | ICD-10-CM | POA: Diagnosis not present

## 2022-06-16 DIAGNOSIS — Z79899 Other long term (current) drug therapy: Secondary | ICD-10-CM | POA: Diagnosis not present

## 2022-06-16 DIAGNOSIS — J984 Other disorders of lung: Secondary | ICD-10-CM | POA: Diagnosis not present

## 2022-06-16 DIAGNOSIS — R0609 Other forms of dyspnea: Secondary | ICD-10-CM | POA: Diagnosis not present

## 2022-06-16 DIAGNOSIS — Z923 Personal history of irradiation: Secondary | ICD-10-CM | POA: Diagnosis not present

## 2022-06-17 ENCOUNTER — Ambulatory Visit: Payer: Medicare Other | Admitting: Internal Medicine

## 2022-06-19 ENCOUNTER — Telehealth: Payer: Self-pay | Admitting: Internal Medicine

## 2022-06-19 NOTE — Telephone Encounter (Signed)
Scheduled per 02/19 los, patient has been called and notified.

## 2022-06-27 DIAGNOSIS — E782 Mixed hyperlipidemia: Secondary | ICD-10-CM | POA: Diagnosis not present

## 2022-06-27 DIAGNOSIS — E039 Hypothyroidism, unspecified: Secondary | ICD-10-CM | POA: Diagnosis not present

## 2022-06-27 DIAGNOSIS — E118 Type 2 diabetes mellitus with unspecified complications: Secondary | ICD-10-CM | POA: Diagnosis not present

## 2022-06-27 DIAGNOSIS — J449 Chronic obstructive pulmonary disease, unspecified: Secondary | ICD-10-CM | POA: Diagnosis not present

## 2022-06-27 DIAGNOSIS — E1159 Type 2 diabetes mellitus with other circulatory complications: Secondary | ICD-10-CM | POA: Diagnosis not present

## 2022-06-27 DIAGNOSIS — E559 Vitamin D deficiency, unspecified: Secondary | ICD-10-CM | POA: Diagnosis not present

## 2022-06-27 DIAGNOSIS — C3411 Malignant neoplasm of upper lobe, right bronchus or lung: Secondary | ICD-10-CM | POA: Diagnosis not present

## 2022-06-27 DIAGNOSIS — I10 Essential (primary) hypertension: Secondary | ICD-10-CM | POA: Diagnosis not present

## 2022-06-27 DIAGNOSIS — Z0000 Encounter for general adult medical examination without abnormal findings: Secondary | ICD-10-CM | POA: Diagnosis not present

## 2022-06-30 ENCOUNTER — Ambulatory Visit (HOSPITAL_COMMUNITY)
Admission: RE | Admit: 2022-06-30 | Discharge: 2022-06-30 | Disposition: A | Discharge status: Home / Self Care | Payer: Medicare Other | Source: Ambulatory Visit | Attending: Urology | Admitting: Urology

## 2022-06-30 DIAGNOSIS — N2889 Other specified disorders of kidney and ureter: Secondary | ICD-10-CM | POA: Insufficient documentation

## 2022-06-30 DIAGNOSIS — N281 Cyst of kidney, acquired: Secondary | ICD-10-CM | POA: Diagnosis not present

## 2022-07-07 ENCOUNTER — Ambulatory Visit: Payer: Medicare Other | Admitting: Urology

## 2022-07-07 ENCOUNTER — Encounter: Payer: Self-pay | Admitting: Internal Medicine

## 2022-07-07 VITALS — BP 106/70 | HR 85

## 2022-07-07 DIAGNOSIS — K8689 Other specified diseases of pancreas: Secondary | ICD-10-CM | POA: Diagnosis not present

## 2022-07-07 DIAGNOSIS — N2889 Other specified disorders of kidney and ureter: Secondary | ICD-10-CM | POA: Diagnosis not present

## 2022-07-07 LAB — BLADDER SCAN AMB NON-IMAGING: Scan Result: 58

## 2022-07-07 NOTE — Progress Notes (Signed)
post void residual =58mL 

## 2022-07-07 NOTE — Progress Notes (Unsigned)
07/07/2022 1:49 PM   Billy Hall [birth year] [id number]  Referring provider: [name], [address] [place],  [place] [address]  No chief complaint on file.   HPI:    PMH: Past Medical History:  Diagnosis Date   Arthritis    Depressive disorder    Diabetes mellitus without complication ([place])    Fatigue    Headache(784.0)    Hyperlipidemia    Hypertension    Hypothyroidism    Snoring     Surgical History: Past Surgical History:  Procedure Laterality Date   COLONOSCOPY N/A 03/08/2013   Procedure: COLONOSCOPY;  Surgeon: Jamesetta So, MD;  [facility];  Service: Gastroenterology;  Laterality: N/A;   IR IMAGING GUIDED PORT INSERTION  03/08/2020   IR US GUIDE BX ASP/DRAIN  03/08/2020   KNEE ARTHROSCOPY Right    2006 By Dr. Luna Glasgow at [facility].    THYROID SURGERY     VIDEO BRONCHOSCOPY WITH ENDOBRONCHIAL ULTRASOUND N/A 01/30/2020   Procedure: VIDEO BRONCHOSCOPY WITH ENDOBRONCHIAL ULTRASOUND;  Surgeon: Melrose Nakayama, MD;  Location: [place];  Service: Thoracic;  Laterality: N/A;    Home Medications:  Allergies as of 07/07/2022       Reactions   Ace Inhibitors Swelling, Rash        Medication List        Accurate as of July 07, 2022  1:49 PM. If you have any questions, ask your nurse or doctor.          acetaminophen 650 MG CR tablet Commonly known as: TYLENOL Take 650 mg by mouth every 8 (eight) hours as needed for pain.   albuterol 108 (90 Base) MCG/ACT inhaler Commonly known as: VENTOLIN HFA Inhale 2 puffs into the lungs every 6 (six) hours as needed for wheezing or shortness of breath.   alfuzosin 10 MG 24 hr tablet Commonly known as: UROXATRAL Take 1 tablet (10 mg total) by mouth daily.   aspirin EC 81 MG tablet Take 81 mg by mouth daily.   cholecalciferol 25 MCG (1000 UNIT) tablet Commonly known as: VITAMIN D3 Take 1,000 Units by mouth daily.   levothyroxine 100 MCG tablet Commonly known as:  SYNTHROID Take 100 mcg by mouth daily.   losartan 50 MG tablet Commonly known as: COZAAR Take 50 mg by mouth daily.   metFORMIN 500 MG tablet Commonly known as: GLUCOPHAGE Take 500 mg by mouth 2 (two) times daily with a meal.   OneTouch Ultra test strip Generic drug: glucose blood 1 each 2 (two) times daily.   sildenafil 20 MG tablet Commonly known as: REVATIO Take 20 mg by mouth daily as needed (ED).   simvastatin 10 MG tablet Commonly known as: ZOCOR Take 10 mg by mouth every evening.        Allergies:  Allergies  Allergen Reactions   Ace Inhibitors Swelling and Rash    Family History: Family History  Problem Relation Age of Onset   Alzheimer's disease Mother     Social History:  reports that he has quit smoking. He has never used smokeless tobacco. He reports that he does not currently use alcohol. He reports that he does not use drugs.  ROS: All other review of systems were reviewed and are negative except what is noted above in HPI  Physical Exam: BP 106/70   Pulse 85   Constitutional:  Alert and oriented, No acute distress. HEENT: [employer] AT, moist mucus membranes.  Trachea midline, no masses. Cardiovascular: No clubbing,  cyanosis, or edema. Respiratory: Normal respiratory effort, no increased work of breathing. GI: Abdomen is soft, nontender, nondistended, no abdominal masses GU: No CVA tenderness.  Lymph: No cervical or inguinal lymphadenopathy. Skin: No rashes, bruises or suspicious lesions. Neurologic: Grossly intact, no focal deficits, moving all 4 extremities. Psychiatric: Normal mood and affect.  Laboratory Data: Lab Results  Component Value Date   WBC 5.8 06/11/2022   HGB 12.3 (L) 06/11/2022   HCT 37.9 (L) 06/11/2022   MCV 78.3 (L) 06/11/2022   PLT 225 06/11/2022    Lab Results  Component Value Date   CREATININE 0.79 06/11/2022    No results found for: "PSA"  No results found for: "TESTOSTERONE"  Lab Results  Component Value Date    HGBA1C 6.8 (H) 01/13/2022    Urinalysis    Component Value Date/Time   COLORURINE AMBER (A) 01/13/2022 0728   APPEARANCEUR Clear 04/07/2022 1035   LABSPEC 1.020 01/13/2022 0728   PHURINE 6.0 01/13/2022 0728   GLUCOSEU Negative 04/07/2022 1035   HGBUR MODERATE (A) 01/13/2022 0728   BILIRUBINUR Negative 04/07/2022 1035   KETONESUR 5 (A) 01/13/2022 0728   PROTEINUR Negative 04/07/2022 1035   PROTEINUR 100 (A) 01/13/2022 0728   NITRITE Negative 04/07/2022 1035   NITRITE NEGATIVE 01/13/2022 0728   LEUKOCYTESUR Negative 04/07/2022 1035   LEUKOCYTESUR NEGATIVE 01/13/2022 0728    Lab Results  Component Value Date   LABMICR See below: 04/07/2022   WBCUA 0-5 04/07/2022   LABEPIT None seen 04/07/2022   MUCUS Present 11/28/2021   BACTERIA Few (A) 04/07/2022    Pertinent Imaging: *** No results found for this or any previous visit.  No results found for this or any previous visit.  No results found for this or any previous visit.  No results found for this or any previous visit.  Results for orders placed during the hospital encounter of 06/30/22  Ultrasound renal complete  Narrative CLINICAL DATA:  Follow-up left renal mass.  EXAM: RENAL / URINARY TRACT ULTRASOUND COMPLETE  COMPARISON:  MRI of the abdomen dated March 25, 2022  FINDINGS: Right Kidney:  Renal measurements: 11.8 x 5.5 x 6.0 cm = volume: 202 mL. Contains a 15 mm cyst. No follow-up imaging recommended for the cyst.  Left Kidney:  Renal measurements: 11.3 x 7.1 x 6.9 cm = volume: 290 mL. The known solid mass in the lower pole of the left kidney measures 4.5 x 4.2 x 4.3 cm today. This mass measured 3.8 x 3.1 cm on the MRI. The findings are consistent with interval growth.  Bladder:  Appears normal for degree of bladder distention.  Other:  None.  IMPRESSION: 1. The known solid mass in the left kidney thought to represent renal cell carcinoma based on previous MRI imaging appears to  have grown in the interval. 2. No other significant abnormalities.   Electronically Signed By: Dorise Bullion III M.D. On: 06/30/2022 18:41  No valid procedures specified. No results found for this or any previous visit.  Results for orders placed during the hospital encounter of 10/12/21  CT Renal Stone Study  Narrative CLINICAL DATA:  Flank pain. Kidney stones suspected. Burning with urination, urinary frequency, and right flank pain starting yesterday. History of right upper lung cancer.  EXAM: CT ABDOMEN AND PELVIS WITHOUT CONTRAST  TECHNIQUE: Multidetector CT imaging of the abdomen and pelvis was performed following the standard protocol without IV contrast.  RADIATION DOSE REDUCTION: This exam was performed according to the departmental dose-optimization program which includes  automated exposure control, adjustment of the mA and/or kV according to patient size and/or use of iterative reconstruction technique.  COMPARISON:  PET CT 02/06/2020  FINDINGS: Lower chest: Lung bases are clear.  Hepatobiliary: No focal liver abnormality is seen. No gallstones, gallbladder wall thickening, or biliary dilatation.  Pancreas: Unremarkable. No pancreatic ductal dilatation or surrounding inflammatory changes.  Spleen: Normal in size without focal abnormality.  Adrenals/Urinary Tract: No adrenal gland nodules. Kidneys are symmetrical in size and shape. No hydronephrosis or hydroureter. There is a 4 mm stone in the lower pole right kidney and a 3 mm stone in the lower pole left kidney. No ureteral stones or bladder stones are demonstrated. The bladder is decompressed.  Stomach/Bowel: Stomach, small bowel, and colon are not abnormally distended. No wall thickening or inflammatory changes. Diverticulosis throughout the colon without evidence of acute diverticulitis. Appendix is normal.  Vascular/Lymphatic: Scattered aortic calcifications. No significant vascular findings  are present. No enlarged abdominal or pelvic lymph nodes.  Reproductive: Prostate gland is not enlarged.  Other: Small periumbilical hernia containing fat. No free air or free fluid in the abdomen.  Musculoskeletal: No acute or significant osseous findings.  IMPRESSION: Nonobstructing intrarenal stones are demonstrated bilaterally. No ureteral stone or obstruction. Mild aortic atherosclerosis.   Electronically Signed By: Lucienne Capers M.D. On: 10/12/2021 04:06   Assessment & Hall:    1. Left renal mass We discussed the natural hx of renal masses and the 80/20 malignant/benign likelihood. We disucssed the treatment options including active surveillance. Renal ablation, partial and radical nephrectomy.  I will refer to Interventional radiology for possible left renal mass ablation - Urinalysis, Routine w reflex microscopic - BLADDER SCAN AMB NON-IMAGING  2. Pancreatic mass -referral to general surgery   No follow-ups on file.  Nicolette Bang, MD  [facility] [place]

## 2022-07-08 ENCOUNTER — Encounter: Payer: Self-pay | Admitting: Urology

## 2022-07-08 LAB — URINALYSIS, ROUTINE W REFLEX MICROSCOPIC
Bilirubin, UA: NEGATIVE
Glucose, UA: NEGATIVE
Ketones, UA: NEGATIVE
Nitrite, UA: NEGATIVE
Protein,UA: NEGATIVE
Specific Gravity, UA: 1.015 (ref 1.005–1.030)
Urobilinogen, Ur: 1 mg/dL (ref 0.2–1.0)
pH, UA: 5.5 (ref 5.0–7.5)

## 2022-07-08 LAB — MICROSCOPIC EXAMINATION: Bacteria, UA: NONE SEEN

## 2022-07-08 NOTE — Patient Instructions (Signed)

## 2022-07-13 ENCOUNTER — Emergency Department (HOSPITAL_COMMUNITY): Payer: Medicare Other

## 2022-07-13 ENCOUNTER — Encounter (HOSPITAL_COMMUNITY): Payer: Self-pay

## 2022-07-13 ENCOUNTER — Other Ambulatory Visit: Payer: Self-pay

## 2022-07-13 ENCOUNTER — Emergency Department (HOSPITAL_COMMUNITY)
Admission: EM | Admit: 2022-07-13 | Discharge: 2022-07-13 | Disposition: A | Discharge status: Home / Self Care | Payer: Medicare Other | Attending: Emergency Medicine | Admitting: Emergency Medicine

## 2022-07-13 DIAGNOSIS — Z743 Need for continuous supervision: Secondary | ICD-10-CM | POA: Diagnosis not present

## 2022-07-13 DIAGNOSIS — S43122A Dislocation of left acromioclavicular joint, 100%-200% displacement, initial encounter: Secondary | ICD-10-CM | POA: Diagnosis not present

## 2022-07-13 DIAGNOSIS — R609 Edema, unspecified: Secondary | ICD-10-CM | POA: Diagnosis not present

## 2022-07-13 DIAGNOSIS — E119 Type 2 diabetes mellitus without complications: Secondary | ICD-10-CM | POA: Diagnosis not present

## 2022-07-13 DIAGNOSIS — Z85118 Personal history of other malignant neoplasm of bronchus and lung: Secondary | ICD-10-CM | POA: Diagnosis not present

## 2022-07-13 DIAGNOSIS — Z7984 Long term (current) use of oral hypoglycemic drugs: Secondary | ICD-10-CM | POA: Insufficient documentation

## 2022-07-13 DIAGNOSIS — M25519 Pain in unspecified shoulder: Secondary | ICD-10-CM | POA: Diagnosis not present

## 2022-07-13 DIAGNOSIS — Z7982 Long term (current) use of aspirin: Secondary | ICD-10-CM | POA: Insufficient documentation

## 2022-07-13 DIAGNOSIS — Z79899 Other long term (current) drug therapy: Secondary | ICD-10-CM | POA: Diagnosis not present

## 2022-07-13 DIAGNOSIS — S43102A Unspecified dislocation of left acromioclavicular joint, initial encounter: Secondary | ICD-10-CM

## 2022-07-13 DIAGNOSIS — I10 Essential (primary) hypertension: Secondary | ICD-10-CM | POA: Insufficient documentation

## 2022-07-13 DIAGNOSIS — S4992XA Unspecified injury of left shoulder and upper arm, initial encounter: Secondary | ICD-10-CM | POA: Diagnosis not present

## 2022-07-13 MED ORDER — NAPROXEN 500 MG PO TABS: 500.0000 mg | ORAL_TABLET | Freq: Two times a day (BID) | ORAL | 0 refills | Status: DC

## 2022-07-13 MED ORDER — OXYCODONE-ACETAMINOPHEN 5-325 MG PO TABS
1.0000 | ORAL_TABLET | Freq: Once | ORAL | Status: AC
  Administered 2022-07-13: 1 via ORAL
  Filled 2022-07-13: qty 1
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
## 2022-07-13 NOTE — ED Notes (Signed)
Into room by w/c from triage, EDP into room, pt alert, NAD, calm, interactive.

## 2022-07-13 NOTE — ED Notes (Signed)
Xray at [facility] 
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
## 2022-07-13 NOTE — ED Notes (Signed)
Shoulder immobilizer applied, pt tolerated well. +PMS distal to injury.  Pt asking to speak w/MD prior to d/c. MD notified.

## 2022-07-13 NOTE — ED Notes (Signed)
[facility] at [facility]

## 2022-07-13 NOTE — Discharge Instructions (Signed)
Your x-ray shows no broken bones but it does show that you have what is called a separated shoulder when 2 of the bones have been strained and pull apart from each other.  Please follow-up with Dr. Aline Brochure, you may use the sling for comfort, Tylenol or Motrin for pain

## 2022-07-13 NOTE — ED Notes (Signed)
Pt alert, NAD, calm, interactive, resps e/u, speaking in clear complete sentences. C/o L shoulder pain, also is tightening / stiffening up across the upper back. LS CTA. CMS, ROM intact.

## 2022-07-15 ENCOUNTER — Ambulatory Visit (INDEPENDENT_AMBULATORY_CARE_PROVIDER_SITE_OTHER): Payer: Medicare Other | Admitting: Orthopedic Surgery

## 2022-07-15 ENCOUNTER — Encounter: Payer: Self-pay | Admitting: Orthopedic Surgery

## 2022-07-15 VITALS — BP 128/73 | HR 76 | Ht 69.0 in | Wt 272.0 lb

## 2022-07-15 DIAGNOSIS — M25512 Pain in left shoulder: Secondary | ICD-10-CM | POA: Diagnosis not present

## 2022-07-16 ENCOUNTER — Encounter: Payer: Self-pay | Admitting: Orthopedic Surgery
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
## 2022-07-25 ENCOUNTER — Inpatient Hospital Stay: Payer: Medicare Other | Attending: Internal Medicine

## 2022-07-25 VITALS — BP 157/80 | HR 73 | Temp 97.9°F | Resp 18

## 2022-07-25 DIAGNOSIS — Z95828 Presence of other vascular implants and grafts: Secondary | ICD-10-CM

## 2022-07-25 DIAGNOSIS — Z452 Encounter for adjustment and management of vascular access device: Secondary | ICD-10-CM | POA: Insufficient documentation

## 2022-07-25 DIAGNOSIS — C3411 Malignant neoplasm of upper lobe, right bronchus or lung: Secondary | ICD-10-CM | POA: Insufficient documentation

## 2022-07-25 MED ORDER — HEPARIN SOD (PORK) LOCK FLUSH 100 UNIT/ML IV SOLN
500.0000 [IU] | Freq: Once | INTRAVENOUS | Status: AC
  Administered 2022-07-25: 500 [IU] via INTRAVENOUS

## 2022-07-25 MED ORDER — SODIUM CHLORIDE 0.9% FLUSH
10.0000 mL | INTRAVENOUS | Status: DC | PRN
  Administered 2022-07-25: 10 mL via INTRAVENOUS

## 2022-07-25 NOTE — Patient Instructions (Signed)
[facility] [place]  Discharge Instructions: Thank you for choosing [facility] to provide your oncology and hematology care.  If you have a lab appointment with the Cancer Center, please come in thru the Main Entrance and check in at the main information desk.  Wear comfortable clothing and clothing appropriate for easy access to any Portacath or PICC line.   We strive to give you quality time with your provider. You may need to reschedule your appointment if you arrive late (15 or more minutes).  Arriving late affects you and other patients whose appointments are after yours.  Also, if you miss three or more appointments without notifying the office, you may be dismissed from the clinic at the provider's discretion.      For prescription refill requests, have your pharmacy contact our office and allow 72 hours for refills to be completed.     To help prevent nausea and vomiting after your treatment, we encourage you to take your nausea medication as directed.  BELOW ARE SYMPTOMS THAT SHOULD BE REPORTED IMMEDIATELY: *FEVER GREATER THAN 100.4 F (38 C) OR HIGHER *CHILLS OR SWEATING *NAUSEA AND VOMITING THAT IS NOT CONTROLLED WITH YOUR NAUSEA MEDICATION *UNUSUAL SHORTNESS OF BREATH *UNUSUAL BRUISING OR BLEEDING *URINARY PROBLEMS (pain or burning when urinating, or frequent urination) *BOWEL PROBLEMS (unusual diarrhea, constipation, pain near the anus) TENDERNESS IN MOUTH AND THROAT WITH OR WITHOUT PRESENCE OF ULCERS (sore throat, sores in mouth, or a toothache) UNUSUAL RASH, SWELLING OR PAIN  UNUSUAL VAGINAL DISCHARGE OR ITCHING   Items with * indicate a potential emergency and should be followed up as soon as possible or go to the Emergency Department if any problems should occur.  Please show the CHEMOTHERAPY ALERT CARD or IMMUNOTHERAPY ALERT CARD at check-in to the Emergency Department and triage nurse.  Should you have questions after your visit or need to  cancel or reschedule your appointment, please contact [facility] [phone]  and follow the prompts.  Office hours are 8:00 a.m. to 4:30 p.m. Monday - Friday. Please note that voicemails left after 4:00 p.m. may not be returned until the following business day.  We are closed weekends and major holidays. You have access to a nurse at all times for urgent questions. Please call the main number to the clinic [phone] and follow the prompts.  For any non-urgent questions, you may also contact your provider using MyChart. We now offer e-Visits for anyone 18 and older to request care online for non-urgent symptoms. For details visit [email or web address].   Also download the MyChart app! Go to the app store, search "MyChart", open the app, select [facility], and log in with your MyChart username and password.   

## 2022-07-25 NOTE — Progress Notes (Signed)
Patients port flushed without difficulty.  Good blood return noted with no bruising or swelling noted at site.  Band aid applied.  VSS with discharge and left in satisfactory condition with no s/s of distress noted.   

## 2022-07-27 DIAGNOSIS — I10 Essential (primary) hypertension: Secondary | ICD-10-CM | POA: Diagnosis not present

## 2022-07-27 DIAGNOSIS — E1165 Type 2 diabetes mellitus with hyperglycemia: Secondary | ICD-10-CM | POA: Diagnosis not present

## 2022-08-26 ENCOUNTER — Ambulatory Visit: Payer: Medicare Other | Admitting: Orthopedic Surgery

## 2022-08-26 ENCOUNTER — Encounter: Payer: Self-pay | Admitting: Orthopedic Surgery

## 2022-08-26 VITALS — BP 119/66 | HR 83 | Ht 69.0 in | Wt 263.0 lb

## 2022-08-26 DIAGNOSIS — M25512 Pain in left shoulder: Secondary | ICD-10-CM | POA: Diagnosis not present
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
## 2022-09-05 ENCOUNTER — Inpatient Hospital Stay: Payer: Medicare Other | Attending: Internal Medicine

## 2022-09-05 VITALS — BP 114/68 | HR 65 | Temp 96.3°F | Resp 18

## 2022-09-05 DIAGNOSIS — C3411 Malignant neoplasm of upper lobe, right bronchus or lung: Secondary | ICD-10-CM | POA: Insufficient documentation

## 2022-09-05 DIAGNOSIS — Z452 Encounter for adjustment and management of vascular access device: Secondary | ICD-10-CM | POA: Diagnosis not present

## 2022-09-05 DIAGNOSIS — Z95828 Presence of other vascular implants and grafts: Secondary | ICD-10-CM

## 2022-09-05 MED ORDER — HEPARIN SOD (PORK) LOCK FLUSH 100 UNIT/ML IV SOLN
500.0000 [IU] | Freq: Once | INTRAVENOUS | Status: AC
  Administered 2022-09-05: 500 [IU]

## 2022-09-05 MED ORDER — SODIUM CHLORIDE 0.9% FLUSH
10.0000 mL | Freq: Once | INTRAVENOUS | Status: AC
  Administered 2022-09-05: 10 mL

## 2022-09-05 NOTE — Progress Notes (Signed)
Marzella Schlein presented for Portacath access and flush.  Portacath located right chest wall accessed with  H 20 needle.  Good blood return present. Portacath flushed with 20ml NS and 500U/60ml Heparin and needle removed intact.  Procedure tolerated well and without incident. Discharged from clinic ambulatory in stable condition. Alert and oriented x 3. F/U with [facility] as scheduled.

## 2022-09-05 NOTE — Patient Instructions (Signed)
[facility]  Discharge Instructions: Thank you for choosing [facility] to provide your oncology and hematology care.  If you have a lab appointment with the Cancer Center - please note that after April 8th, 2024, all labs will be drawn in the cancer center.  You do not have to check in or register with the main entrance as you have in the past but will complete your check-in in the cancer center.  Wear comfortable clothing and clothing appropriate for easy access to any Portacath or PICC line.   We strive to give you quality time with your provider. You may need to reschedule your appointment if you arrive late (15 or more minutes).  Arriving late affects you and other patients whose appointments are after yours.  Also, if you miss three or more appointments without notifying the office, you may be dismissed from the clinic at the provider's discretion.      For prescription refill requests, have your pharmacy contact our office and allow 72 hours for refills to be completed.    Today you received a port flush.       To help prevent nausea and vomiting after your treatment, we encourage you to take your nausea medication as directed.  BELOW ARE SYMPTOMS THAT SHOULD BE REPORTED IMMEDIATELY: *FEVER GREATER THAN 100.4 F (38 C) OR HIGHER *CHILLS OR SWEATING *NAUSEA AND VOMITING THAT IS NOT CONTROLLED WITH YOUR NAUSEA MEDICATION *UNUSUAL SHORTNESS OF BREATH *UNUSUAL BRUISING OR BLEEDING *URINARY PROBLEMS (pain or burning when urinating, or frequent urination) *BOWEL PROBLEMS (unusual diarrhea, constipation, pain near the anus) TENDERNESS IN MOUTH AND THROAT WITH OR WITHOUT PRESENCE OF ULCERS (sore throat, sores in mouth, or a toothache) UNUSUAL RASH, SWELLING OR PAIN  UNUSUAL VAGINAL DISCHARGE OR ITCHING   Items with * indicate a potential emergency and should be followed up as soon as possible or go to the Emergency Department if any problems should  occur.  Please show the CHEMOTHERAPY ALERT CARD or IMMUNOTHERAPY ALERT CARD at check-in to the Emergency Department and triage nurse.  Should you have questions after your visit or need to cancel or reschedule your appointment, please contact [facility] [phone]  and follow the prompts.  Office hours are 8:00 a.m. to 4:30 p.m. Monday - Friday. Please note that voicemails left after 4:00 p.m. may not be returned until the following business day.  We are closed weekends and major holidays. You have access to a nurse at all times for urgent questions. Please call the main number to the clinic [phone] and follow the prompts.  For any non-urgent questions, you may also contact your provider using MyChart. We now offer e-Visits for anyone 48 and older to request care online for non-urgent symptoms. For details visit [email or web address].   Also download the MyChart app! Go to the app store, search "MyChart", open the app, select [facility], and log in with your MyChart username and password.

## 2022-09-15 DIAGNOSIS — E1159 Type 2 diabetes mellitus with other circulatory complications: Secondary | ICD-10-CM | POA: Diagnosis not present

## 2022-09-15 DIAGNOSIS — I10 Essential (primary) hypertension: Secondary | ICD-10-CM | POA: Diagnosis not present

## 2022-09-15 DIAGNOSIS — C3411 Malignant neoplasm of upper lobe, right bronchus or lung: Secondary | ICD-10-CM | POA: Diagnosis not present

## 2022-09-15 DIAGNOSIS — E559 Vitamin D deficiency, unspecified: Secondary | ICD-10-CM | POA: Diagnosis not present

## 2022-09-15 DIAGNOSIS — E782 Mixed hyperlipidemia: Secondary | ICD-10-CM | POA: Diagnosis not present

## 2022-09-15 DIAGNOSIS — E039 Hypothyroidism, unspecified: Secondary | ICD-10-CM | POA: Diagnosis not present

## 2022-09-15 DIAGNOSIS — J449 Chronic obstructive pulmonary disease, unspecified: Secondary | ICD-10-CM | POA: Diagnosis not present

## 2022-10-07 ENCOUNTER — Ambulatory Visit (HOSPITAL_COMMUNITY)
Admission: RE | Admit: 2022-10-07 | Discharge: 2022-10-07 | Disposition: A | Discharge status: Home / Self Care | Payer: Medicare Other | Source: Ambulatory Visit | Attending: Urology | Admitting: Urology

## 2022-10-07 DIAGNOSIS — N2889 Other specified disorders of kidney and ureter: Secondary | ICD-10-CM | POA: Diagnosis not present

## 2022-10-10 ENCOUNTER — Ambulatory Visit: Payer: Medicare Other | Admitting: Urology

## 2022-10-10 VITALS — BP 138/77 | HR 80 | Ht 69.0 in | Wt 263.0 lb

## 2022-10-10 DIAGNOSIS — N2889 Other specified disorders of kidney and ureter: Secondary | ICD-10-CM | POA: Diagnosis not present

## 2022-10-10 LAB — URINALYSIS, ROUTINE W REFLEX MICROSCOPIC
Bilirubin, UA: NEGATIVE
Glucose, UA: NEGATIVE
Ketones, UA: NEGATIVE
Leukocytes,UA: NEGATIVE
Nitrite, UA: NEGATIVE
Protein,UA: NEGATIVE
RBC, UA: NEGATIVE
Specific Gravity, UA: 1.025 (ref 1.005–1.030)
Urobilinogen, Ur: 1 mg/dL (ref 0.2–1.0)
pH, UA: 6 (ref 5.0–7.5)

## 2022-10-10 NOTE — Progress Notes (Signed)
10/10/2022 12:14 PM   Billy Hall 1945-07-29 [id number]  Referring provider: Assunta Found, MD [address] [place],  [place] [address]  Followup left renal mass   HPI: Mr Billy Hall is a 77yo here for followup for left renal mass. Renal US from 6/11 shows a stable left renal mass. He denies any flank pain. NO worsening LUTS. No hematuria.    PMH: Past Medical History:  Diagnosis Date   Arthritis    Depressive disorder    Diabetes mellitus without complication (HCC)    Fatigue    Headache(784.0)    Hyperlipidemia    Hypertension    Hypothyroidism    Snoring     Surgical History: Past Surgical History:  Procedure Laterality Date   COLONOSCOPY N/A 03/08/2013   Procedure: COLONOSCOPY;  Surgeon: Dalia Heading, MD;  Location: [facility];  Service: Gastroenterology;  Laterality: N/A;   IR IMAGING GUIDED PORT INSERTION  03/08/2020   IR US GUIDE BX ASP/DRAIN  03/08/2020   KNEE ARTHROSCOPY Right    2006 By Dr. Hilda Lias at [facility].    THYROID SURGERY     VIDEO BRONCHOSCOPY WITH ENDOBRONCHIAL ULTRASOUND N/A 01/30/2020   Procedure: VIDEO BRONCHOSCOPY WITH ENDOBRONCHIAL ULTRASOUND;  Surgeon: Loreli Slot, MD;  Location: [facility] OR;  Service: Thoracic;  Laterality: N/A;    Home Medications:  Allergies as of 10/10/2022       Reactions   Ace Inhibitors Swelling, Rash        Medication List        Accurate as of October 10, 2022 12:14 PM. If you have any questions, ask your nurse or doctor.          acetaminophen 650 MG CR tablet Commonly known as: TYLENOL Take 650 mg by mouth every 8 (eight) hours as needed for pain.   albuterol 108 (90 Base) MCG/ACT inhaler Commonly known as: VENTOLIN HFA Inhale 2 puffs into the lungs every 6 (six) hours as needed for wheezing or shortness of breath.   alfuzosin 10 MG 24 hr tablet Commonly known as: UROXATRAL Take 1 tablet (10 mg total) by mouth daily.   aspirin EC 81 MG tablet Take 81 mg by mouth daily.    cholecalciferol 25 MCG (1000 UNIT) tablet Commonly known as: VITAMIN D3 Take 1,000 Units by mouth daily.   levothyroxine 100 MCG tablet Commonly known as: SYNTHROID Take 100 mcg by mouth daily.   losartan 50 MG tablet Commonly known as: COZAAR Take 50 mg by mouth daily.   metFORMIN 500 MG tablet Commonly known as: GLUCOPHAGE Take 500 mg by mouth 2 (two) times daily with a meal.   naproxen 500 MG tablet Commonly known as: Naprosyn Take 1 tablet (500 mg total) by mouth 2 (two) times daily with a meal.   OneTouch Ultra test strip Generic drug: glucose blood 1 each 2 (two) times daily.   sildenafil 20 MG tablet Commonly known as: REVATIO Take 20 mg by mouth daily as needed (ED).   simvastatin 10 MG tablet Commonly known as: ZOCOR Take 10 mg by mouth every evening.        Allergies:  Allergies  Allergen Reactions   Ace Inhibitors Swelling and Rash    Family History: Family History  Problem Relation Age of Onset   Alzheimer's disease Mother     Social History:  reports that he has quit smoking. He has never used smokeless tobacco. He reports that he does not currently use alcohol. He reports that he does  not use drugs.  ROS: All other review of systems were reviewed and are negative except what is noted above in HPI  Physical Exam: BP 138/77   Pulse 80   Ht 5\' 9"  (1.753 m)   Wt 263 lb (119.3 kg)   BMI 38.84 kg/m   Constitutional:  Alert and oriented, No acute distress. HEENT:  AT, moist mucus membranes.  Trachea midline, no masses. Cardiovascular: No clubbing, cyanosis, or edema. Respiratory: Normal respiratory effort, no increased work of breathing. GI: Abdomen is soft, nontender, nondistended, no abdominal masses GU: No CVA tenderness.  Lymph: No cervical or inguinal lymphadenopathy. Skin: No rashes, bruises or suspicious lesions. Neurologic: Grossly intact, no focal deficits, moving all 4 extremities. Psychiatric: Normal mood and  affect.  Laboratory Data: Lab Results  Component Value Date   WBC 5.8 06/11/2022   HGB 12.3 (L) 06/11/2022   HCT 37.9 (L) 06/11/2022   MCV 78.3 (L) 06/11/2022   PLT 225 06/11/2022    Lab Results  Component Value Date   CREATININE 0.79 06/11/2022    No results found for: "PSA"  No results found for: "TESTOSTERONE"  Lab Results  Component Value Date   HGBA1C 6.8 (H) 01/13/2022    Urinalysis    Component Value Date/Time   COLORURINE AMBER (A) 01/13/2022 0728   APPEARANCEUR Clear 07/07/2022 1335   LABSPEC 1.020 01/13/2022 0728   PHURINE 6.0 01/13/2022 0728   GLUCOSEU Negative 07/07/2022 1335   HGBUR MODERATE (A) 01/13/2022 0728   BILIRUBINUR Negative 07/07/2022 1335   KETONESUR 5 (A) 01/13/2022 0728   PROTEINUR Negative 07/07/2022 1335   PROTEINUR 100 (A) 01/13/2022 0728   NITRITE Negative 07/07/2022 1335   NITRITE NEGATIVE 01/13/2022 0728   LEUKOCYTESUR Trace (A) 07/07/2022 1335   LEUKOCYTESUR NEGATIVE 01/13/2022 0728    Lab Results  Component Value Date   LABMICR See below: 07/07/2022   WBCUA 0-5 07/07/2022   LABEPIT 0-10 07/07/2022   MUCUS Present 11/28/2021   BACTERIA None seen 07/07/2022    Pertinent Imaging: Renal US 10/07/2022: Images reviewed and discussed with the patient  No results found for this or any previous visit.  No results found for this or any previous visit.  No results found for this or any previous visit.  No results found for this or any previous visit.  Results for orders placed during the hospital encounter of 10/07/22  Ultrasound renal complete  Narrative CLINICAL DATA:  Left renal mass follow-up  EXAM: RENAL / URINARY TRACT ULTRASOUND COMPLETE  COMPARISON:  Renal ultrasound June 30, 2022. MRI of the abdomen and pelvis March 25, 2022.  FINDINGS: Right Kidney:  Renal measurements: 12.5 x 6.3 x 5.3 cm = volume: 218 mL. There is an upper pole 17 mm cyst. No follow-up imaging recommended for the cyst.  Left  Kidney:  Renal measurements: 10.3 x 7.1 x 5.0 cm = volume: 190 mL. The known solid mass in the lower pole of the left kidney measures 3.9 x 3.9 x 4.0 cm today versus 4.2 x 4.3 x 4.5 cm June 30, 2018 for versus 3.8 x 3.1 cm on the previous MRI.  Bladder:  Appears normal for degree of bladder distention.  Other:  None.  IMPRESSION: 1. The known solid mass in the lower pole of the left kidney measures 3.9 x 3.9 x 4.0 cm today versus 4.2 x 4.3 x 4.5 cm on the prior ultrasound. The mass measured 3.8 x 3.1 cm on the previous MRI. The mass is a presumed renal  cell carcinoma based on previous MRI. Recommend attention on follow-up. 2. No other abnormalities.   Electronically Signed By: Gerome Sam III M.D. On: 10/07/2022 14:50  No valid procedures specified. No results found for this or any previous visit.  Results for orders placed during the hospital encounter of 10/12/21  CT Renal Stone Study  Narrative CLINICAL DATA:  Flank pain. Kidney stones suspected. Burning with urination, urinary frequency, and right flank pain starting yesterday. History of right upper lung cancer.  EXAM: CT ABDOMEN AND PELVIS WITHOUT CONTRAST  TECHNIQUE: Multidetector CT imaging of the abdomen and pelvis was performed following the standard protocol without IV contrast.  RADIATION DOSE REDUCTION: This exam was performed according to the departmental dose-optimization program which includes automated exposure control, adjustment of the mA and/or kV according to patient size and/or use of iterative reconstruction technique.  COMPARISON:  PET CT 02/06/2020  FINDINGS: Lower chest: Lung bases are clear.  Hepatobiliary: No focal liver abnormality is seen. No gallstones, gallbladder wall thickening, or biliary dilatation.  Pancreas: Unremarkable. No pancreatic ductal dilatation or surrounding inflammatory changes.  Spleen: Normal in size without focal abnormality.  Adrenals/Urinary  Tract: No adrenal gland nodules. Kidneys are symmetrical in size and shape. No hydronephrosis or hydroureter. There is a 4 mm stone in the lower pole right kidney and a 3 mm stone in the lower pole left kidney. No ureteral stones or bladder stones are demonstrated. The bladder is decompressed.  Stomach/Bowel: Stomach, small bowel, and colon are not abnormally distended. No wall thickening or inflammatory changes. Diverticulosis throughout the colon without evidence of acute diverticulitis. Appendix is normal.  Vascular/Lymphatic: Scattered aortic calcifications. No significant vascular findings are present. No enlarged abdominal or pelvic lymph nodes.  Reproductive: Prostate gland is not enlarged.  Other: Small periumbilical hernia containing fat. No free air or free fluid in the abdomen.  Musculoskeletal: No acute or significant osseous findings.  IMPRESSION: Nonobstructing intrarenal stones are demonstrated bilaterally. No ureteral stone or obstruction. Mild aortic atherosclerosis.   Electronically Signed By: Burman Nieves M.D. On: 10/12/2021 04:06   Assessment & Plan:    1. Left renal mass -Referral to IR for consideration of renal mass ablation - Urinalysis, Routine w reflex microscopic   No follow-ups on file.  Wilkie Aye, MD  [facility] [place]

## 2022-10-17 ENCOUNTER — Inpatient Hospital Stay: Payer: Medicare Other | Attending: Internal Medicine

## 2022-10-17 ENCOUNTER — Other Ambulatory Visit (HOSPITAL_COMMUNITY): Payer: Self-pay | Admitting: Surgery

## 2022-10-17 ENCOUNTER — Encounter: Payer: Self-pay | Admitting: Urology

## 2022-10-17 VITALS — BP 120/70 | HR 64 | Temp 97.8°F | Resp 18

## 2022-10-17 DIAGNOSIS — M13161 Monoarthritis, not elsewhere classified, right knee: Secondary | ICD-10-CM | POA: Diagnosis not present

## 2022-10-17 DIAGNOSIS — C3411 Malignant neoplasm of upper lobe, right bronchus or lung: Secondary | ICD-10-CM | POA: Diagnosis not present

## 2022-10-17 DIAGNOSIS — M13162 Monoarthritis, not elsewhere classified, left knee: Secondary | ICD-10-CM | POA: Diagnosis not present

## 2022-10-17 DIAGNOSIS — D490 Neoplasm of unspecified behavior of digestive system: Secondary | ICD-10-CM

## 2022-10-17 MED ORDER — SODIUM CHLORIDE 0.9% FLUSH
10.0000 mL | INTRAVENOUS | Status: DC | PRN
  Administered 2022-10-17: 10 mL

## 2022-10-17 MED ORDER — HEPARIN SOD (PORK) LOCK FLUSH 100 UNIT/ML IV SOLN
500.0000 [IU] | Freq: Once | INTRAVENOUS | Status: AC
  Administered 2022-10-17: 500 [IU] via INTRAVENOUS

## 2022-10-17 NOTE — Progress Notes (Signed)
Billy Hall presented for Portacath access and flush.  Portacath located right chest wall accessed with  H 20 needle.  No blood return noted. Portacath flushed with 20ml NS and 500U/42ml Heparin and needle removed intact. No bruising or swelling noted at the site. Procedure tolerated well and without incident.     Discharged from clinic ambulatory in stable condition. Alert and oriented x 3. F/U with [facility] as scheduled.

## 2022-10-17 NOTE — Patient Instructions (Signed)

## 2022-10-24 ENCOUNTER — Ambulatory Visit (HOSPITAL_COMMUNITY)
Admission: RE | Admit: 2022-10-24 | Discharge: 2022-10-24 | Disposition: A | Discharge status: Home / Self Care | Payer: Medicare Other | Source: Ambulatory Visit | Attending: Surgery | Admitting: Surgery

## 2022-10-24 ENCOUNTER — Other Ambulatory Visit: Payer: Self-pay

## 2022-10-24 DIAGNOSIS — K7689 Other specified diseases of liver: Secondary | ICD-10-CM | POA: Diagnosis not present

## 2022-10-24 DIAGNOSIS — N289 Disorder of kidney and ureter, unspecified: Secondary | ICD-10-CM | POA: Diagnosis not present

## 2022-10-24 DIAGNOSIS — D490 Neoplasm of unspecified behavior of digestive system: Secondary | ICD-10-CM | POA: Insufficient documentation

## 2022-10-24 DIAGNOSIS — N281 Cyst of kidney, acquired: Secondary | ICD-10-CM | POA: Diagnosis not present

## 2022-10-24 DIAGNOSIS — K8689 Other specified diseases of pancreas: Secondary | ICD-10-CM | POA: Diagnosis not present

## 2022-10-24 DIAGNOSIS — N2889 Other specified disorders of kidney and ureter: Secondary | ICD-10-CM

## 2022-10-24 MED ORDER — GADOBUTROL 1 MMOL/ML IV SOLN
10.0000 mL | Freq: Once | INTRAVENOUS | Status: AC | PRN
  Administered 2022-10-24: 10 mL via INTRAVENOUS

## 2022-11-17 DIAGNOSIS — D490 Neoplasm of unspecified behavior of digestive system: Secondary | ICD-10-CM | POA: Diagnosis not present

## 2022-11-17 DIAGNOSIS — K862 Cyst of pancreas: Secondary | ICD-10-CM | POA: Diagnosis not present

## 2022-11-18 ENCOUNTER — Telehealth: Payer: Self-pay | Admitting: *Deleted

## 2022-11-18 NOTE — Progress Notes (Signed)
  Care Coordination   Note   11/18/2022 Name: Billy Hall MRN: [id number] DOB: 27-Nov-1945  Billy Hall is a 77 y.o. year old male who sees Assunta Found, MD for primary care. I reached out to Marzella Schlein by phone today to offer care coordination services.  [name] was given information about Care Coordination services today including:   The Care Coordination services include support from the care team which includes your Nurse Coordinator, Clinical Social Worker, or Pharmacist.  The Care Coordination team is here to help remove barriers to the health concerns and goals most important to you. Care Coordination services are voluntary, and the patient may decline or stop services at any time by request to their care team member.   Care Coordination Consent Status: Patient agreed to services and verbal consent obtained.   Follow up plan:  Telephone appointment with care coordination team member scheduled for:  12/05/22  Encounter Outcome:  Pt. Scheduled  [facility] Coordination Care Guide  Direct Dial: [phone]

## 2022-11-28 ENCOUNTER — Inpatient Hospital Stay: Payer: Medicare Other | Attending: Internal Medicine

## 2022-11-28 DIAGNOSIS — Z923 Personal history of irradiation: Secondary | ICD-10-CM | POA: Diagnosis not present

## 2022-11-28 DIAGNOSIS — R0602 Shortness of breath: Secondary | ICD-10-CM | POA: Insufficient documentation

## 2022-11-28 DIAGNOSIS — Z79899 Other long term (current) drug therapy: Secondary | ICD-10-CM | POA: Insufficient documentation

## 2022-11-28 DIAGNOSIS — R0609 Other forms of dyspnea: Secondary | ICD-10-CM | POA: Insufficient documentation

## 2022-11-28 DIAGNOSIS — C3411 Malignant neoplasm of upper lobe, right bronchus or lung: Secondary | ICD-10-CM | POA: Insufficient documentation

## 2022-11-28 DIAGNOSIS — R1310 Dysphagia, unspecified: Secondary | ICD-10-CM | POA: Diagnosis not present

## 2022-11-28 DIAGNOSIS — R5383 Other fatigue: Secondary | ICD-10-CM | POA: Insufficient documentation

## 2022-11-28 MED ORDER — SODIUM CHLORIDE 0.9% FLUSH
10.0000 mL | Freq: Once | INTRAVENOUS | Status: AC
  Administered 2022-11-28: 10 mL via INTRAVENOUS

## 2022-11-28 MED ORDER — HEPARIN SOD (PORK) LOCK FLUSH 100 UNIT/ML IV SOLN
500.0000 [IU] | Freq: Once | INTRAVENOUS | Status: AC
  Administered 2022-11-28: 500 [IU] via INTRAVENOUS

## 2022-11-28 NOTE — Progress Notes (Signed)
Patients port flushed without difficulty.  Good blood return noted with no bruising or swelling noted at site.  Band aid applied.  VSS with discharge and left in satisfactory condition with no s/s of distress noted.   

## 2022-11-28 NOTE — Patient Instructions (Signed)
[facility]  Discharge Instructions: Thank you for choosing [facility] to provide your oncology and hematology care.  If you have a lab appointment with the Cancer Center - please note that after April 8th, 2024, all labs will be drawn in the cancer center.  You do not have to check in or register with the main entrance as you have in the past but will complete your check-in in the cancer center.  Wear comfortable clothing and clothing appropriate for easy access to any Portacath or PICC line.   We strive to give you quality time with your provider. You may need to reschedule your appointment if you arrive late (15 or more minutes).  Arriving late affects you and other patients whose appointments are after yours.  Also, if you miss three or more appointments without notifying the office, you may be dismissed from the clinic at the provider's discretion.      For prescription refill requests, have your pharmacy contact our office and allow 72 hours for refills to be completed.    Today you received the following chemotherapy and/or immunotherapy agents Port flush.       To help prevent nausea and vomiting after your treatment, we encourage you to take your nausea medication as directed.  BELOW ARE SYMPTOMS THAT SHOULD BE REPORTED IMMEDIATELY: *FEVER GREATER THAN 100.4 F (38 C) OR HIGHER *CHILLS OR SWEATING *NAUSEA AND VOMITING THAT IS NOT CONTROLLED WITH YOUR NAUSEA MEDICATION *UNUSUAL SHORTNESS OF BREATH *UNUSUAL BRUISING OR BLEEDING *URINARY PROBLEMS (pain or burning when urinating, or frequent urination) *BOWEL PROBLEMS (unusual diarrhea, constipation, pain near the anus) TENDERNESS IN MOUTH AND THROAT WITH OR WITHOUT PRESENCE OF ULCERS (sore throat, sores in mouth, or a toothache) UNUSUAL RASH, SWELLING OR PAIN  UNUSUAL VAGINAL DISCHARGE OR ITCHING   Items with * indicate a potential emergency and should be followed up as soon as possible or go to  the Emergency Department if any problems should occur.  Please show the CHEMOTHERAPY ALERT CARD or IMMUNOTHERAPY ALERT CARD at check-in to the Emergency Department and triage nurse.  Should you have questions after your visit or need to cancel or reschedule your appointment, please contact [facility] [phone]  and follow the prompts.  Office hours are 8:00 a.m. to 4:30 p.m. Monday - Friday. Please note that voicemails left after 4:00 p.m. may not be returned until the following business day.  We are closed weekends and major holidays. You have access to a nurse at all times for urgent questions. Please call the main number to the clinic [phone] and follow the prompts.  For any non-urgent questions, you may also contact your provider using MyChart. We now offer e-Visits for anyone 18 and older to request care online for non-urgent symptoms. For details visit [email or web address].   Also download the MyChart app! Go to the app store, search "MyChart", open the app, select [facility], and log in with your MyChart username and password.

## 2022-12-05 ENCOUNTER — Ambulatory Visit: Payer: Self-pay | Admitting: *Deleted

## 2022-12-05 ENCOUNTER — Encounter: Payer: Self-pay | Admitting: *Deleted

## 2022-12-05 NOTE — Patient Outreach (Signed)
Care Coordination   Initial Visit Note   12/05/2022 Name: Billy Hall MRN: [id number] DOB: 20-Nov-1945  Billy Hall is a 77 y.o. year old male who sees Assunta Found, MD for primary care. I spoke with  Marzella Schlein by phone today.  What matters to the patients health and wellness today?  Improving knee pain. Patient is followed by endocrinology, PCP, orthopedics, and oncology regularly. He participates in church activities at least twice per week and has family/friends that assist him as needed. He is widowed and lives alone but is able to perform ADLs and IADLs independently. No resource or care coordination needs identified today.   Goals Addressed             This Visit's Progress    COMPLETED: Care Coordination Services (No follow-up needed)       Care Coordination Goals: Patient will follow-up with PCP and/or specialist(s) as recommended Patient will take medications as prescribed Patient will continue to use a cane for ambulation and practice fall precautions Patient will continue to check and record blood sugar daily and as needed and will reach out to provider with any readings less than 70 or with 3 readings in a row greater than 200 Patient will continue monitor and record blood pressure daily and will reach out to provider with any readings outside of recommended range Patient will reach out to RN Care Coordinator [phone] with any care coordination or resource needs           SDOH assessments and interventions completed:  Yes  SDOH Interventions Today    Flowsheet Row Most Recent Value  SDOH Interventions   Housing Interventions Intervention Not Indicated  Transportation Interventions Intervention Not Indicated  Utilities Interventions Intervention Not Indicated  Financial Strain Interventions Intervention Not Indicated  Physical Activity Interventions Intervention Not Indicated  Social Connections Interventions Intervention Not  Indicated        Care Coordination Interventions:  Yes, provided  Interventions Today    Flowsheet Row Most Recent Value  Chronic Disease   Chronic disease during today's visit Other, Diabetes, Hypertension (HTN)  [lung cancer, arthritis bil knees]  General Interventions   General Interventions Discussed/Reviewed General Interventions Discussed, General Interventions Reviewed, Annual Eye Exam, Annual Foot Exam, Durable Medical Equipment (DME), Doctor Visits  Doctor Visits Discussed/Reviewed Doctor Visits Discussed, Specialist, Doctor Visits Reviewed, Annual Wellness Visits, PCP  [Saw PCP on 09/15/22]  Durable Medical Equipment (DME) Glucomoter, BP Cuff, Other  [cane. Home blood sugar readings are WNL. No readings less than 70 or above 200.Reports blood pressure is well controlled. In office readigs reivewd and were WNL. No readings available at time of call.]  PCP/Specialist Visits Compliance with follow-up visit  [Keep follow-up with PCP, oncologist, and orthopedic surgeon. Plans to have gel knee injections on 12/31/22.]  Exercise Interventions   Exercise Discussed/Reviewed Exercise Discussed, Exercise Reviewed, Physical Activity  Physical Activity Discussed/Reviewed Physical Activity Discussed, Physical Activity Reviewed  [able to perform ADLs independently. Uses a cane for ambulation due to bilateral knee pain. Unable to increase physical activity at this time.]  Education Interventions   Education Provided Provided Education  Provided Verbal Education On Nutrition, Foot Care, Eye Care, Labs, Blood Sugar Monitoring, Exercise, Medication, When to see the doctor  Labs Reviewed Hgb A1c  [09/15/22 A1C 6.5]  Nutrition Interventions   Nutrition Discussed/Reviewed Nutrition Discussed, Nutrition Reviewed, Carbohydrate meal planning, Portion sizes, Fluid intake, Adding fruits and vegetables, Increasing proteins  [patient is following a generally healthy diet.  Encouraged 3 meals a day with 30 GM of CHO  and up to 2 snacks per day with less than 15 GM of CHO.]  Pharmacy Interventions   Pharmacy Dicussed/Reviewed Pharmacy Topics Discussed, Pharmacy Topics Reviewed, Medications and their functions  [takes medications regularly. Medications are affordable.]  Safety Interventions   Safety Discussed/Reviewed Safety Discussed, Safety Reviewed, Fall Risk, Home Safety  Home Safety Assistive Devices  [No falls. Using cane for ambulation due to knee pain]       Follow up plan: No further intervention required. Provided with RN Care Coordinator contact number and encouraged to reach out with any resource or care coordination needs.    Encounter Outcome:  Pt. Visit Completed   Demetrios Loll, BSN, RN-BC RN Care Coordinator [facility]  [facility] Direct Dial: [phone] Main #: [phone]

## 2022-12-15 ENCOUNTER — Encounter (HOSPITAL_COMMUNITY): Payer: Self-pay

## 2022-12-15 ENCOUNTER — Ambulatory Visit (HOSPITAL_COMMUNITY)
Admission: RE | Admit: 2022-12-15 | Discharge: 2022-12-15 | Disposition: A | Discharge status: Home / Self Care | Payer: Medicare Other | Source: Ambulatory Visit | Attending: Physician Assistant | Admitting: Physician Assistant

## 2022-12-15 ENCOUNTER — Inpatient Hospital Stay: Payer: Medicare Other

## 2022-12-15 DIAGNOSIS — Z95828 Presence of other vascular implants and grafts: Secondary | ICD-10-CM

## 2022-12-15 DIAGNOSIS — I700 Atherosclerosis of aorta: Secondary | ICD-10-CM | POA: Diagnosis not present

## 2022-12-15 DIAGNOSIS — C3490 Malignant neoplasm of unspecified part of unspecified bronchus or lung: Secondary | ICD-10-CM | POA: Diagnosis not present

## 2022-12-15 DIAGNOSIS — C3411 Malignant neoplasm of upper lobe, right bronchus or lung: Secondary | ICD-10-CM

## 2022-12-15 LAB — CMP (CANCER CENTER ONLY)
ALT: 19 U/L (ref 0–44)
AST: 18 U/L (ref 15–41)
Albumin: 3.8 g/dL (ref 3.5–5.0)
Alkaline Phosphatase: 66 U/L (ref 38–126)
Anion gap: 4 — ABNORMAL LOW (ref 5–15)
BUN: 15 mg/dL (ref 8–23)
CO2: 31 mmol/L (ref 22–32)
Calcium: 8.9 mg/dL (ref 8.9–10.3)
Chloride: 105 mmol/L (ref 98–111)
Creatinine: 0.8 mg/dL (ref 0.61–1.24)
GFR, Estimated: >60 mL/min (ref >60)
Glucose, Bld: 125 mg/dL — ABNORMAL HIGH (ref 70–99)
Potassium: 4.3 mmol/L (ref 3.5–5.1)
Sodium: 140 mmol/L (ref 135–145)
Total Bilirubin: 0.6 mg/dL (ref 0.3–1.2)
Total Protein: 6.9 g/dL (ref 6.5–8.1)

## 2022-12-15 LAB — CBC WITH DIFFERENTIAL (CANCER CENTER ONLY)
Abs Immature Granulocytes: 0.02 10*3/uL (ref 0.00–0.07)
Basophils Absolute: 0 10*3/uL (ref 0.0–0.1)
Basophils Relative: 1 %
Eosinophils Absolute: 0.1 10*3/uL (ref 0.0–0.5)
Eosinophils Relative: 2 %
HCT: 40.4 % (ref 39.0–52.0)
Hemoglobin: 13 g/dL (ref 13.0–17.0)
Immature Granulocytes: 0 %
Lymphocytes Relative: 22 %
Lymphs Abs: 1.2 10*3/uL (ref 0.7–4.0)
MCH: 26.6 pg (ref 26.0–34.0)
MCHC: 32.2 g/dL (ref 30.0–36.0)
MCV: 82.8 fL (ref 80.0–100.0)
Monocytes Absolute: 0.6 10*3/uL (ref 0.1–1.0)
Monocytes Relative: 11 %
Neutro Abs: 3.6 10*3/uL (ref 1.7–7.7)
Neutrophils Relative %: 64 %
Platelet Count: 173 10*3/uL (ref 150–400)
RBC: 4.88 MIL/uL (ref 4.22–5.81)
RDW: 16.6 % — ABNORMAL HIGH (ref 11.5–15.5)
WBC Count: 5.6 10*3/uL (ref 4.0–10.5)
nRBC: 0 % (ref 0.0–0.2)

## 2022-12-15 MED ORDER — SODIUM CHLORIDE 0.9% FLUSH
10.0000 mL | Freq: Once | INTRAVENOUS | Status: AC
  Administered 2022-12-15: 10 mL

## 2022-12-15 MED ORDER — IOHEXOL 300 MG/ML  SOLN
75.0000 mL | Freq: Once | INTRAMUSCULAR | Status: AC | PRN
  Administered 2022-12-15: 75 mL via INTRAVENOUS

## 2022-12-15 MED ORDER — HEPARIN SOD (PORK) LOCK FLUSH 100 UNIT/ML IV SOLN
500.0000 [IU] | Freq: Once | INTRAVENOUS | Status: AC
  Administered 2022-12-15: 500 [IU] via INTRAVENOUS

## 2022-12-17 ENCOUNTER — Inpatient Hospital Stay: Payer: Medicare Other | Admitting: Internal Medicine

## 2022-12-17 VITALS — BP 131/76 | HR 88 | Temp 98.3°F | Resp 18 | Ht 69.0 in | Wt 273.0 lb

## 2022-12-17 DIAGNOSIS — R0602 Shortness of breath: Secondary | ICD-10-CM | POA: Diagnosis not present

## 2022-12-17 DIAGNOSIS — C3411 Malignant neoplasm of upper lobe, right bronchus or lung: Secondary | ICD-10-CM | POA: Diagnosis not present

## 2022-12-17 DIAGNOSIS — R5383 Other fatigue: Secondary | ICD-10-CM | POA: Diagnosis not present

## 2022-12-17 DIAGNOSIS — C3490 Malignant neoplasm of unspecified part of unspecified bronchus or lung: Secondary | ICD-10-CM

## 2022-12-17 DIAGNOSIS — R0609 Other forms of dyspnea: Secondary | ICD-10-CM | POA: Diagnosis not present

## 2022-12-17 DIAGNOSIS — R1310 Dysphagia, unspecified: Secondary | ICD-10-CM | POA: Diagnosis not present

## 2022-12-17 DIAGNOSIS — Z79899 Other long term (current) drug therapy: Secondary | ICD-10-CM | POA: Diagnosis not present

## 2022-12-17 DIAGNOSIS — Z923 Personal history of irradiation: Secondary | ICD-10-CM | POA: Diagnosis not present

## 2022-12-17 NOTE — Progress Notes (Signed)
[facility] [phone]   [phone]) [phone]  OFFICE PROGRESS NOTE  Assunta Found, MD [address] [place] [address]  DIAGNOSIS: Stage IIIA (T4, N0, M0) non-small cell lung cancer, squamous cell carcinoma presented with large right upper lobe lung mass diagnosed in October 2021.   PRIOR THERAPY:  1) Concurrent chemoradiation with weekly carboplatin for AUC of 2 and paclitaxel 45 mg/M2. Status post 6 cycles. First dose on 02/27/20.  Last dose was given April 02, 2020. 2) Consolidation treatment with immunotherapy with Imfinzi 1500 mg IV every 4 weeks.  First dose May 10, 2020.  Status post 13 cycles.  CURRENT THERAPY: Observation  INTERVAL HISTORY: Billy Hall 77 y.o. male returns to the clinic today for follow-up visit.  The patient is feeling fine today with no concerning complaints except for the baseline fatigue and shortness of breath with exertion.  He has no chest pain, cough or hemoptysis.  He has no nausea, vomiting, diarrhea or constipation.  He has no headache or visual changes.  He denied having any fever or chills.  He is here today for evaluation with repeat CT scan of the chest for restaging of his disease.  MEDICAL HISTORY: Past Medical History:  Diagnosis Date   Arthritis    Depressive disorder    Diabetes mellitus without complication (HCC)    Fatigue    Headache(784.0)    Hyperlipidemia    Hypertension    Hypothyroidism    Snoring     ALLERGIES:  is allergic to ace inhibitors.  MEDICATIONS:  Current Outpatient Medications  Medication Sig Dispense Refill   acetaminophen (TYLENOL) 650 MG CR tablet Take 650 mg by mouth every 8 (eight) hours as needed for pain.     albuterol (VENTOLIN HFA) 108 (90 Base) MCG/ACT inhaler Inhale 2 puffs into the lungs every 6 (six) hours as needed for wheezing or shortness of breath. 8 g 2   alfuzosin (UROXATRAL) 10 MG 24 hr tablet Take 1 tablet (10 mg total) by mouth daily.  30 tablet 11   aspirin EC 81 MG tablet Take 81 mg by mouth daily.     cholecalciferol (VITAMIN D3) 25 MCG (1000 UNIT) tablet Take 1,000 Units by mouth daily.     levothyroxine (SYNTHROID) 100 MCG tablet Take 100 mcg by mouth daily.     losartan (COZAAR) 50 MG tablet Take 50 mg by mouth daily.      metFORMIN (GLUCOPHAGE) 500 MG tablet Take 500 mg by mouth 2 (two) times daily with a meal.      naproxen (NAPROSYN) 500 MG tablet Take 1 tablet (500 mg total) by mouth 2 (two) times daily with a meal. 30 tablet 0   ONETOUCH ULTRA test strip 1 each 2 (two) times daily.     sildenafil (REVATIO) 20 MG tablet Take 20 mg by mouth daily as needed (ED).      simvastatin (ZOCOR) 10 MG tablet Take 10 mg by mouth every evening.      No current facility-administered medications for this visit.    SURGICAL HISTORY:  Past Surgical History:  Procedure Laterality Date   COLONOSCOPY N/A 03/08/2013   Procedure: COLONOSCOPY;  Surgeon: Dalia Heading, MD;  [facility]: [facility];  Service: Gastroenterology;  Laterality: N/A;   IR IMAGING GUIDED PORT INSERTION  03/08/2020   IR US GUIDE BX ASP/DRAIN  03/08/2020   KNEE ARTHROSCOPY Right    2006 By Dr. Hilda Lias at [facility].  THYROID SURGERY     VIDEO BRONCHOSCOPY WITH ENDOBRONCHIAL ULTRASOUND N/A 01/30/2020   Procedure: VIDEO BRONCHOSCOPY WITH ENDOBRONCHIAL ULTRASOUND;  Surgeon: Loreli Slot, MD;  Location: [facility] OR;  Service: Thoracic;  Laterality: N/A;    REVIEW OF SYSTEMS:  A comprehensive review of systems was negative except for: Constitutional: positive for fatigue Respiratory: positive for dyspnea on exertion   PHYSICAL EXAMINATION: General appearance: alert, cooperative, and no distress Head: Normocephalic, without obvious abnormality, atraumatic Neck: no adenopathy, no JVD, supple, symmetrical, trachea midline, and thyroid not enlarged, symmetric, no tenderness/mass/nodules Lymph nodes: Cervical, supraclavicular, and axillary nodes normal. Resp:  clear to auscultation bilaterally Back: symmetric, no curvature. ROM normal. No CVA tenderness. Cardio: regular rate and rhythm, S1, S2 normal, no murmur, click, rub or gallop GI: soft, non-tender; bowel sounds normal; no masses,  no organomegaly Extremities: extremities normal, atraumatic, no cyanosis or edema  ECOG PERFORMANCE STATUS: 1 - Symptomatic but completely ambulatory  There were no vitals taken for this visit.  LABORATORY DATA: Lab Results  Component Value Date   WBC 5.6 12/15/2022   HGB 13.0 12/15/2022   HCT 40.4 12/15/2022   MCV 82.8 12/15/2022   PLT 173 12/15/2022      Chemistry      Component Value Date/Time   NA 140 12/15/2022 1505   K 4.3 12/15/2022 1505   CL 105 12/15/2022 1505   CO2 31 12/15/2022 1505   BUN 15 12/15/2022 1505   CREATININE 0.80 12/15/2022 1505      Component Value Date/Time   CALCIUM 8.9 12/15/2022 1505   ALKPHOS 66 12/15/2022 1505   AST 18 12/15/2022 1505   ALT 19 12/15/2022 1505   BILITOT 0.6 12/15/2022 1505       RADIOGRAPHIC STUDIES: No results found.  ASSESSMENT AND PLAN: This is a very pleasant 77 years old African-American male recently diagnosed with a stage IIIA non-small cell lung cancer, squamous cell carcinoma presented with large right upper lobe lung mass in October 2021. The patient underwent a course of concurrent chemoradiation with weekly carboplatin and paclitaxel status post 6 cycles.  The patient tolerated this course of treatment fairly well except for mild odynophagia. Imaging studies after the induction phase of concurrent chemoradiation showed interval decrease in the size and significant central cavitation of the previously demonstrated right upper lobe lung mass consistent with response to therapy. The patient underwent a course of consolidation treatment with immunotherapy with Imfinzi 1500 mg IV every 4 weeks status post 13 cycles.   He is currently on observation and feeling fine with no concerning  complaints except for the baseline shortness of breath and fatigue. He had repeat CT scan of the chest performed recently.  The final report is still pending but I personally and independently reviewed the scan images and discussed the result with the patient today.  I did not see any clear finding for disease progression but definitely I will wait for the final report for confirmation. I recommended for the patient to continue on observation with repeat CT scan of the chest in 6 months.  If there is any concerning findings on the pending scan report, I will call the patient with additional recommendation. He was advised to call immediately if he has any other concerning symptoms in the interval. The patient voices understanding of current disease status and treatment options and is in agreement with the current care plan.  All questions were answered. The patient knows to call the clinic with any problems, questions or  concerns. We can certainly see the patient much sooner if necessary.   Disclaimer: This note was dictated with voice recognition software. Similar sounding words can inadvertently be transcribed and may not be corrected upon review.

## 2022-12-31 DIAGNOSIS — M170 Bilateral primary osteoarthritis of knee: Secondary | ICD-10-CM | POA: Diagnosis not present

## 2022-12-31 DIAGNOSIS — M25561 Pain in right knee: Secondary | ICD-10-CM | POA: Diagnosis not present

## 2023-01-16 ENCOUNTER — Other Ambulatory Visit: Payer: Self-pay | Admitting: Urology

## 2023-01-16 DIAGNOSIS — E1159 Type 2 diabetes mellitus with other circulatory complications: Secondary | ICD-10-CM | POA: Diagnosis not present

## 2023-01-16 DIAGNOSIS — E039 Hypothyroidism, unspecified: Secondary | ICD-10-CM | POA: Diagnosis not present

## 2023-01-16 DIAGNOSIS — J449 Chronic obstructive pulmonary disease, unspecified: Secondary | ICD-10-CM | POA: Diagnosis not present

## 2023-01-16 DIAGNOSIS — Z23 Encounter for immunization: Secondary | ICD-10-CM | POA: Diagnosis not present

## 2023-01-16 DIAGNOSIS — C3411 Malignant neoplasm of upper lobe, right bronchus or lung: Secondary | ICD-10-CM | POA: Diagnosis not present

## 2023-01-16 DIAGNOSIS — N138 Other obstructive and reflux uropathy: Secondary | ICD-10-CM

## 2023-01-16 DIAGNOSIS — E782 Mixed hyperlipidemia: Secondary | ICD-10-CM | POA: Diagnosis not present

## 2023-01-16 DIAGNOSIS — I10 Essential (primary) hypertension: Secondary | ICD-10-CM | POA: Diagnosis not present

## 2023-01-16 NOTE — Telephone Encounter (Signed)
Patient completely out of medication needs called into [facility]   Alfuzosin 10 mg

## 2023-01-19 ENCOUNTER — Telehealth: Payer: Self-pay

## 2023-01-19 DIAGNOSIS — E119 Type 2 diabetes mellitus without complications: Secondary | ICD-10-CM | POA: Diagnosis not present

## 2023-01-19 DIAGNOSIS — N138 Other obstructive and reflux uropathy: Secondary | ICD-10-CM

## 2023-01-19 MED ORDER — ALFUZOSIN HCL ER 10 MG PO TB24: 10.0000 mg | ORAL_TABLET | Freq: Every day | ORAL | 11 refills | Status: DC

## 2023-01-19 NOTE — Telephone Encounter (Signed)
Rx refill pick up and print

## 2023-01-19 NOTE — Telephone Encounter (Signed)
Patient needing hard copy of refill of  alfuzosin (UROXATRAL) 10 MG 24 hr tablet .  Wanting to stop by to pick up after an appointment today.

## 2023-01-20 NOTE — Telephone Encounter (Signed)
Rx refilled by Dr. Ronne Binning on 09/23

## 2023-02-10 ENCOUNTER — Encounter: Payer: Self-pay | Admitting: Internal Medicine

## 2023-02-10 NOTE — Telephone Encounter (Signed)
TC

## 2023-02-12 DIAGNOSIS — M170 Bilateral primary osteoarthritis of knee: Secondary | ICD-10-CM | POA: Diagnosis not present

## 2023-02-13 ENCOUNTER — Inpatient Hospital Stay: Payer: Medicare Other | Attending: Internal Medicine

## 2023-02-13 VITALS — BP 137/74 | HR 68 | Temp 97.3°F | Resp 18

## 2023-02-13 DIAGNOSIS — Z95828 Presence of other vascular implants and grafts: Secondary | ICD-10-CM

## 2023-02-13 DIAGNOSIS — C3411 Malignant neoplasm of upper lobe, right bronchus or lung: Secondary | ICD-10-CM | POA: Diagnosis not present

## 2023-02-13 DIAGNOSIS — Z79899 Other long term (current) drug therapy: Secondary | ICD-10-CM | POA: Diagnosis not present

## 2023-02-13 MED ORDER — HEPARIN SOD (PORK) LOCK FLUSH 100 UNIT/ML IV SOLN
500.0000 [IU] | Freq: Once | INTRAVENOUS | Status: AC
  Administered 2023-02-13: 500 [IU] via INTRAVENOUS

## 2023-02-13 MED ORDER — SODIUM CHLORIDE 0.9% FLUSH
10.0000 mL | INTRAVENOUS | Status: DC | PRN
  Administered 2023-02-13: 10 mL via INTRAVENOUS

## 2023-02-13 NOTE — Progress Notes (Signed)
Patients port flushed without difficulty.  Good blood return noted with no bruising or swelling noted at site.  Band aid applied.  VSS with discharge and left in satisfactory condition with no s/s of distress noted.   

## 2023-02-13 NOTE — Patient Instructions (Signed)

## 2023-02-19 DIAGNOSIS — M170 Bilateral primary osteoarthritis of knee: Secondary | ICD-10-CM | POA: Diagnosis not present

## 2023-02-26 DIAGNOSIS — M170 Bilateral primary osteoarthritis of knee: Secondary | ICD-10-CM | POA: Diagnosis not present

## 2023-03-18 ENCOUNTER — Other Ambulatory Visit (HOSPITAL_COMMUNITY): Payer: Self-pay | Admitting: Surgery

## 2023-03-18 DIAGNOSIS — D490 Neoplasm of unspecified behavior of digestive system: Secondary | ICD-10-CM

## 2023-03-18 DIAGNOSIS — K862 Cyst of pancreas: Secondary | ICD-10-CM

## 2023-04-03 ENCOUNTER — Inpatient Hospital Stay: Payer: Medicare Other | Attending: Internal Medicine

## 2023-04-03 VITALS — BP 138/78 | HR 84 | Temp 98.4°F | Resp 18

## 2023-04-03 DIAGNOSIS — Z79899 Other long term (current) drug therapy: Secondary | ICD-10-CM | POA: Insufficient documentation

## 2023-04-03 DIAGNOSIS — C3411 Malignant neoplasm of upper lobe, right bronchus or lung: Secondary | ICD-10-CM | POA: Insufficient documentation

## 2023-04-03 DIAGNOSIS — Z95828 Presence of other vascular implants and grafts: Secondary | ICD-10-CM

## 2023-04-03 MED ORDER — SODIUM CHLORIDE 0.9% FLUSH
10.0000 mL | INTRAVENOUS | Status: DC | PRN
  Administered 2023-04-03: 10 mL via INTRAVENOUS

## 2023-04-03 MED ORDER — HEPARIN SOD (PORK) LOCK FLUSH 100 UNIT/ML IV SOLN
500.0000 [IU] | Freq: Once | INTRAVENOUS | Status: AC
  Administered 2023-04-03: 500 [IU] via INTRAVENOUS

## 2023-04-03 NOTE — Patient Instructions (Signed)

## 2023-04-03 NOTE — Progress Notes (Signed)
Patients port flushed without difficulty.  Good blood return noted with no bruising or swelling noted at site.  Band aid applied.  VSS with discharge and left in satisfactory condition with no s/s of distress noted.   

## 2023-04-06 ENCOUNTER — Ambulatory Visit: Payer: Medicare Other | Admitting: Urology

## 2023-04-08 DIAGNOSIS — M170 Bilateral primary osteoarthritis of knee: Secondary | ICD-10-CM | POA: Diagnosis not present

## 2023-04-28 ENCOUNTER — Other Ambulatory Visit (HOSPITAL_COMMUNITY): Payer: Self-pay | Admitting: Surgery

## 2023-04-28 ENCOUNTER — Ambulatory Visit (HOSPITAL_COMMUNITY)
Admission: RE | Admit: 2023-04-28 | Discharge: 2023-04-28 | Disposition: A | Discharge status: Home / Self Care | Payer: Medicare Other | Source: Ambulatory Visit | Attending: Surgery | Admitting: Surgery

## 2023-04-28 DIAGNOSIS — E782 Mixed hyperlipidemia: Secondary | ICD-10-CM | POA: Diagnosis not present

## 2023-04-28 DIAGNOSIS — E1159 Type 2 diabetes mellitus with other circulatory complications: Secondary | ICD-10-CM | POA: Diagnosis not present

## 2023-04-28 DIAGNOSIS — D490 Neoplasm of unspecified behavior of digestive system: Secondary | ICD-10-CM | POA: Insufficient documentation

## 2023-04-28 DIAGNOSIS — J449 Chronic obstructive pulmonary disease, unspecified: Secondary | ICD-10-CM | POA: Diagnosis not present

## 2023-04-28 DIAGNOSIS — K862 Cyst of pancreas: Secondary | ICD-10-CM

## 2023-04-28 DIAGNOSIS — N2889 Other specified disorders of kidney and ureter: Secondary | ICD-10-CM | POA: Diagnosis not present

## 2023-04-28 DIAGNOSIS — C3490 Malignant neoplasm of unspecified part of unspecified bronchus or lung: Secondary | ICD-10-CM | POA: Diagnosis not present

## 2023-04-28 DIAGNOSIS — N281 Cyst of kidney, acquired: Secondary | ICD-10-CM | POA: Diagnosis not present

## 2023-04-28 DIAGNOSIS — K8689 Other specified diseases of pancreas: Secondary | ICD-10-CM | POA: Diagnosis not present

## 2023-04-28 MED ORDER — GADOBUTROL 1 MMOL/ML IV SOLN
10.0000 mL | Freq: Once | INTRAVENOUS | Status: AC | PRN
  Administered 2023-04-28: 10 mL via INTRAVENOUS

## 2023-05-15 ENCOUNTER — Inpatient Hospital Stay: Payer: Medicare Other | Attending: Internal Medicine

## 2023-05-15 VITALS — BP 139/79 | HR 75 | Temp 97.9°F | Resp 18

## 2023-05-15 DIAGNOSIS — Z452 Encounter for adjustment and management of vascular access device: Secondary | ICD-10-CM | POA: Insufficient documentation

## 2023-05-15 DIAGNOSIS — Z95828 Presence of other vascular implants and grafts: Secondary | ICD-10-CM

## 2023-05-15 DIAGNOSIS — C3411 Malignant neoplasm of upper lobe, right bronchus or lung: Secondary | ICD-10-CM | POA: Diagnosis not present

## 2023-05-15 MED ORDER — SODIUM CHLORIDE 0.9% FLUSH
10.0000 mL | Freq: Once | INTRAVENOUS | Status: AC
  Administered 2023-05-15: 10 mL

## 2023-05-15 MED ORDER — HEPARIN SOD (PORK) LOCK FLUSH 100 UNIT/ML IV SOLN
500.0000 [IU] | Freq: Once | INTRAVENOUS | Status: AC
  Administered 2023-05-15: 500 [IU]

## 2023-05-15 NOTE — Progress Notes (Signed)
 Patients port flushed without difficulty.  Good blood return noted with no bruising or swelling noted at site.  Gauze dressing applied.  VSS with discharge and left in satisfactory condition with no s/s of distress noted. All follow ups as scheduled.   Jamesmichael Shadd [employer]

## 2023-05-22 ENCOUNTER — Ambulatory Visit (HOSPITAL_COMMUNITY)
Admission: RE | Admit: 2023-05-22 | Discharge: 2023-05-22 | Disposition: A | Discharge status: Home / Self Care | Payer: Medicare Other | Source: Ambulatory Visit | Attending: Urology | Admitting: Urology

## 2023-05-22 DIAGNOSIS — N2889 Other specified disorders of kidney and ureter: Secondary | ICD-10-CM | POA: Diagnosis not present

## 2023-05-29 DIAGNOSIS — K862 Cyst of pancreas: Secondary | ICD-10-CM | POA: Diagnosis not present

## 2023-05-29 DIAGNOSIS — D490 Neoplasm of unspecified behavior of digestive system: Secondary | ICD-10-CM | POA: Diagnosis not present

## 2023-06-02 NOTE — Progress Notes (Signed)
 Letter sent.

## 2023-06-10 ENCOUNTER — Ambulatory Visit (HOSPITAL_COMMUNITY)
Admission: RE | Admit: 2023-06-10 | Discharge: 2023-06-10 | Disposition: A | Discharge status: Home / Self Care | Payer: Medicare Other | Source: Ambulatory Visit | Attending: Internal Medicine | Admitting: Internal Medicine

## 2023-06-10 ENCOUNTER — Ambulatory Visit: Payer: Medicare Other | Admitting: Urology

## 2023-06-10 ENCOUNTER — Other Ambulatory Visit: Payer: Medicare Other

## 2023-06-10 ENCOUNTER — Inpatient Hospital Stay: Payer: Medicare Other | Attending: Internal Medicine

## 2023-06-10 DIAGNOSIS — Z9221 Personal history of antineoplastic chemotherapy: Secondary | ICD-10-CM | POA: Insufficient documentation

## 2023-06-10 DIAGNOSIS — I700 Atherosclerosis of aorta: Secondary | ICD-10-CM | POA: Diagnosis not present

## 2023-06-10 DIAGNOSIS — R1310 Dysphagia, unspecified: Secondary | ICD-10-CM | POA: Diagnosis not present

## 2023-06-10 DIAGNOSIS — Z95828 Presence of other vascular implants and grafts: Secondary | ICD-10-CM

## 2023-06-10 DIAGNOSIS — J984 Other disorders of lung: Secondary | ICD-10-CM | POA: Diagnosis not present

## 2023-06-10 DIAGNOSIS — R062 Wheezing: Secondary | ICD-10-CM | POA: Diagnosis not present

## 2023-06-10 DIAGNOSIS — C3490 Malignant neoplasm of unspecified part of unspecified bronchus or lung: Secondary | ICD-10-CM

## 2023-06-10 DIAGNOSIS — C3411 Malignant neoplasm of upper lobe, right bronchus or lung: Secondary | ICD-10-CM | POA: Insufficient documentation

## 2023-06-10 DIAGNOSIS — Z79899 Other long term (current) drug therapy: Secondary | ICD-10-CM | POA: Insufficient documentation

## 2023-06-10 DIAGNOSIS — Z9226 Personal history of immune checkpoint inhibitor therapy: Secondary | ICD-10-CM | POA: Diagnosis not present

## 2023-06-10 DIAGNOSIS — Z923 Personal history of irradiation: Secondary | ICD-10-CM | POA: Diagnosis not present

## 2023-06-10 DIAGNOSIS — J8410 Pulmonary fibrosis, unspecified: Secondary | ICD-10-CM | POA: Diagnosis not present

## 2023-06-10 DIAGNOSIS — N2889 Other specified disorders of kidney and ureter: Secondary | ICD-10-CM | POA: Insufficient documentation

## 2023-06-10 LAB — CBC WITH DIFFERENTIAL (CANCER CENTER ONLY)
Abs Immature Granulocytes: 0.01 10*3/uL (ref 0.00–0.07)
Basophils Absolute: 0 10*3/uL (ref 0.0–0.1)
Basophils Relative: 1 %
Eosinophils Absolute: 0.1 10*3/uL (ref 0.0–0.5)
Eosinophils Relative: 2 %
HCT: 40.2 % (ref 39.0–52.0)
Hemoglobin: 12.9 g/dL — ABNORMAL LOW (ref 13.0–17.0)
Immature Granulocytes: 0 %
Lymphocytes Relative: 20 %
Lymphs Abs: 1 10*3/uL (ref 0.7–4.0)
MCH: 26.8 pg (ref 26.0–34.0)
MCHC: 32.1 g/dL (ref 30.0–36.0)
MCV: 83.6 fL (ref 80.0–100.0)
Monocytes Absolute: 0.5 10*3/uL (ref 0.1–1.0)
Monocytes Relative: 11 %
Neutro Abs: 3.1 10*3/uL (ref 1.7–7.7)
Neutrophils Relative %: 66 %
Platelet Count: 176 10*3/uL (ref 150–400)
RBC: 4.81 MIL/uL (ref 4.22–5.81)
RDW: 15.7 % — ABNORMAL HIGH (ref 11.5–15.5)
WBC Count: 4.7 10*3/uL (ref 4.0–10.5)
nRBC: 0 % (ref 0.0–0.2)

## 2023-06-10 LAB — CMP (CANCER CENTER ONLY)
ALT: 13 U/L (ref 0–44)
AST: 13 U/L — ABNORMAL LOW (ref 15–41)
Albumin: 3.8 g/dL (ref 3.5–5.0)
Alkaline Phosphatase: 63 U/L (ref 38–126)
Anion gap: 4 — ABNORMAL LOW (ref 5–15)
BUN: 16 mg/dL (ref 8–23)
CO2: 30 mmol/L (ref 22–32)
Calcium: 9 mg/dL (ref 8.9–10.3)
Chloride: 105 mmol/L (ref 98–111)
Creatinine: 0.78 mg/dL (ref 0.61–1.24)
GFR, Estimated: >60 mL/min (ref >60)
Glucose, Bld: 123 mg/dL — ABNORMAL HIGH (ref 70–99)
Potassium: 4.3 mmol/L (ref 3.5–5.1)
Sodium: 139 mmol/L (ref 135–145)
Total Bilirubin: 0.6 mg/dL (ref 0.0–1.2)
Total Protein: 6.7 g/dL (ref 6.5–8.1)

## 2023-06-10 MED ORDER — SODIUM CHLORIDE 0.9% FLUSH
10.0000 mL | Freq: Once | INTRAVENOUS | Status: AC
  Administered 2023-06-10: 10 mL

## 2023-06-10 MED ORDER — IOHEXOL 300 MG/ML  SOLN
75.0000 mL | Freq: Once | INTRAMUSCULAR | Status: AC | PRN
  Administered 2023-06-10: 75 mL via INTRAVENOUS

## 2023-06-12 ENCOUNTER — Ambulatory Visit: Payer: Medicare Other | Admitting: Urology

## 2023-06-16 ENCOUNTER — Inpatient Hospital Stay: Payer: Medicare Other | Admitting: Internal Medicine

## 2023-06-16 VITALS — BP 136/68 | HR 72 | Temp 98.1°F | Resp 18 | Ht 69.0 in | Wt 275.7 lb

## 2023-06-16 DIAGNOSIS — Z9221 Personal history of antineoplastic chemotherapy: Secondary | ICD-10-CM | POA: Diagnosis not present

## 2023-06-16 DIAGNOSIS — Z9226 Personal history of immune checkpoint inhibitor therapy: Secondary | ICD-10-CM | POA: Diagnosis not present

## 2023-06-16 DIAGNOSIS — R1310 Dysphagia, unspecified: Secondary | ICD-10-CM | POA: Diagnosis not present

## 2023-06-16 DIAGNOSIS — C3490 Malignant neoplasm of unspecified part of unspecified bronchus or lung: Secondary | ICD-10-CM | POA: Diagnosis not present

## 2023-06-16 DIAGNOSIS — Z79899 Other long term (current) drug therapy: Secondary | ICD-10-CM | POA: Diagnosis not present

## 2023-06-16 DIAGNOSIS — Z923 Personal history of irradiation: Secondary | ICD-10-CM | POA: Diagnosis not present

## 2023-06-16 DIAGNOSIS — R062 Wheezing: Secondary | ICD-10-CM | POA: Diagnosis not present

## 2023-06-16 DIAGNOSIS — I700 Atherosclerosis of aorta: Secondary | ICD-10-CM | POA: Diagnosis not present

## 2023-06-16 DIAGNOSIS — N2889 Other specified disorders of kidney and ureter: Secondary | ICD-10-CM | POA: Diagnosis not present

## 2023-06-16 DIAGNOSIS — C3411 Malignant neoplasm of upper lobe, right bronchus or lung: Secondary | ICD-10-CM | POA: Diagnosis not present

## 2023-06-16 NOTE — Progress Notes (Signed)
[facility] Telephone:([phone]) [phone]   Fax:(336) [phone]  OFFICE PROGRESS NOTE  Assunta Found, MD [address] [place] [address]  DIAGNOSIS: Stage IIIA (T4, N0, M0) non-small cell lung cancer, squamous cell carcinoma presented with large right upper lobe lung mass diagnosed in October 2021.   PRIOR THERAPY:  1) Concurrent chemoradiation with weekly carboplatin for AUC of 2 and paclitaxel 45 mg/M2. Status post 6 cycles. First dose on 02/27/20.  Last dose was given April 02, 2020. 2) Consolidation treatment with immunotherapy with Imfinzi 1500 mg IV every 4 weeks.  First dose May 10, 2020.  Status post 13 cycles.  CURRENT THERAPY: Observation  INTERVAL HISTORY: Billy Hall 78 y.o. male returns to the clinic today for 85-month follow-up visit.Discussed the use of AI scribe software for clinical note transcription with the patient, who gave verbal consent to proceed.  History of Present Illness   Billy Hall is a 78 year old male with stage IIIA non-small cell lung cancer who presents for follow-up after completing treatment.  He was diagnosed with stage IIIA non-small cell lung cancer, squamous cell carcinoma, in October 2021. He underwent chemotherapy and radiation, followed by a one-year treatment with Imfinzi (durvalumab) every four weeks, which he completed in December 2023. Since then, he has been under observation.  He feels 'pretty good' overall but continues to experience coughing and wheezing. He has a cough that produces a small amount of clear phlegm, but no hemoptysis. No nausea, vomiting, diarrhea, or weight loss, and he notes a slight weight gain since the last visit.  A recent scan was performed last week, but the radiologist has not yet provided a report.        MEDICAL HISTORY: Past Medical History:  Diagnosis Date   Arthritis    Depressive disorder    Diabetes mellitus without complication (HCC)    Fatigue     Headache(784.0)    Hyperlipidemia    Hypertension    Hypothyroidism    Snoring     ALLERGIES:  is allergic to ace inhibitors.  MEDICATIONS:  Current Outpatient Medications  Medication Sig Dispense Refill   acetaminophen (TYLENOL) 650 MG CR tablet Take 650 mg by mouth every 8 (eight) hours as needed for pain.     albuterol (VENTOLIN HFA) 108 (90 Base) MCG/ACT inhaler Inhale 2 puffs into the lungs every 6 (six) hours as needed for wheezing or shortness of breath. 8 g 2   alfuzosin (UROXATRAL) 10 MG 24 hr tablet Take 1 tablet (10 mg total) by mouth daily. 30 tablet 11   aspirin EC 81 MG tablet Take 81 mg by mouth daily.     cholecalciferol (VITAMIN D3) 25 MCG (1000 UNIT) tablet Take 1,000 Units by mouth daily.     levothyroxine (SYNTHROID) 100 MCG tablet Take 100 mcg by mouth daily.     losartan (COZAAR) 50 MG tablet Take 50 mg by mouth daily.      metFORMIN (GLUCOPHAGE) 500 MG tablet Take 500 mg by mouth 2 (two) times daily with a meal.      naproxen (NAPROSYN) 500 MG tablet Take 1 tablet (500 mg total) by mouth 2 (two) times daily with a meal. 30 tablet 0   ONETOUCH ULTRA test strip 1 each 2 (two) times daily.     sildenafil (REVATIO) 20 MG tablet Take 20 mg by mouth daily as needed (ED).      simvastatin (ZOCOR) 10 MG tablet Take 10 mg  by mouth every evening.      No current facility-administered medications for this visit.    SURGICAL HISTORY:  Past Surgical History:  Procedure Laterality Date   COLONOSCOPY N/A 03/08/2013   Procedure: COLONOSCOPY;  Surgeon: Dalia Heading, MD;  [facility];  Service: Gastroenterology;  Laterality: N/A;   IR IMAGING GUIDED PORT INSERTION  03/08/2020   IR US GUIDE BX ASP/DRAIN  03/08/2020   KNEE ARTHROSCOPY Right    2006 By Dr. Hilda Lias at [facility].    THYROID SURGERY     VIDEO BRONCHOSCOPY WITH ENDOBRONCHIAL ULTRASOUND N/A 01/30/2020   Procedure: VIDEO BRONCHOSCOPY WITH ENDOBRONCHIAL ULTRASOUND;  Surgeon: Loreli Slot, MD;   Location: [facility] OR;  Service: Thoracic;  Laterality: N/A;    REVIEW OF SYSTEMS:  Constitutional: positive for fatigue Eyes: negative Ears, nose, mouth, throat, and face: negative Respiratory: positive for cough and dyspnea on exertion Cardiovascular: negative Gastrointestinal: negative Genitourinary:negative Integument/breast: negative Hematologic/lymphatic: negative Musculoskeletal:negative Neurological: negative Behavioral/Psych: negative Endocrine: negative Allergic/Immunologic: negative   PHYSICAL EXAMINATION: General appearance: alert, cooperative, and no distress Head: Normocephalic, without obvious abnormality, atraumatic Neck: no adenopathy, no JVD, supple, symmetrical, trachea midline, and thyroid not enlarged, symmetric, no tenderness/mass/nodules Lymph nodes: Cervical, supraclavicular, and axillary nodes normal. Resp: clear to auscultation bilaterally Back: symmetric, no curvature. ROM normal. No CVA tenderness. Cardio: regular rate and rhythm, S1, S2 normal, no murmur, click, rub or gallop GI: soft, non-tender; bowel sounds normal; no masses,  no organomegaly Extremities: extremities normal, atraumatic, no cyanosis or edema Neurologic: Alert and oriented X 3, normal strength and tone. Normal symmetric reflexes. Normal coordination and gait  ECOG PERFORMANCE STATUS: 1 - Symptomatic but completely ambulatory  Blood pressure 136/68, pulse 72, temperature 98.1 F (36.7 C), temperature source Temporal, resp. rate 18, height 5\' 9"  (1.753 m), weight 275 lb 11.2 oz (125.1 kg), SpO2 98%.  LABORATORY DATA: Lab Results  Component Value Date   WBC 4.7 06/10/2023   HGB 12.9 (L) 06/10/2023   HCT 40.2 06/10/2023   MCV 83.6 06/10/2023   PLT 176 06/10/2023      Chemistry      Component Value Date/Time   NA 139 06/10/2023 0913   K 4.3 06/10/2023 0913   CL 105 06/10/2023 0913   CO2 30 06/10/2023 0913   BUN 16 06/10/2023 0913   CREATININE 0.78 06/10/2023 0913      Component  Value Date/Time   CALCIUM 9.0 06/10/2023 0913   ALKPHOS 63 06/10/2023 0913   AST 13 (L) 06/10/2023 0913   ALT 13 06/10/2023 0913   BILITOT 0.6 06/10/2023 0913       RADIOGRAPHIC STUDIES: Ultrasound renal complete Result Date: 05/22/2023 CLINICAL DATA:  Left renal mass follow-up EXAM: RENAL / URINARY TRACT ULTRASOUND COMPLETE COMPARISON:  MRI abdomen 10/24/2022; renal ultrasound 10/07/2022 FINDINGS: Right Kidney: Renal measurements: 12.0 x 6.9 x 6.1 cm = volume: 264.6 mL. Normal renal cortical thickness and echogenicity. There is a 1.5 cm cyst. No hydronephrosis. Left Kidney: Renal measurements: 11.2 x 7.2 x 5.1 cm = volume: 216 mL. Normal renal cortical thickness and echogenicity. No hydronephrosis. There is a 4.2 x 4.5 x 3.4 cm solid and cystic exophytic mass off the left kidney, similar to prior where it measured 3.9 x 3.9 x 4.0 cm. Bladder: Appears normal for degree of bladder distention. Other: None. IMPRESSION: 1. No significant change in size of solid and cystic exophytic mass off the left kidney, presumed to represent a renal cell carcinoma. 2. No hydronephrosis. Electronically  Signed   By: Annia Belt M.D.   On: 05/22/2023 13:43    ASSESSMENT AND PLAN: This is a very pleasant 78 years old African-American male recently diagnosed with a stage IIIA non-small cell lung cancer, squamous cell carcinoma presented with large right upper lobe lung mass in October 2021. The patient underwent a course of concurrent chemoradiation with weekly carboplatin and paclitaxel status post 6 cycles.  The patient tolerated this course of treatment fairly well except for mild odynophagia. Imaging studies after the induction phase of concurrent chemoradiation showed interval decrease in the size and significant central cavitation of the previously demonstrated right upper lobe lung mass consistent with response to therapy. The patient underwent a course of consolidation treatment with immunotherapy with Imfinzi  1500 mg IV every 4 weeks status post 13 cycles.   The patient is currently on observation and he is feeling fine. He had repeat CT scan of the chest performed recently.  I personally and independently reviewed the scan images and discussed the result with the patient today.  The final report of this scan is still pending.    Stage IIIA Non-Small Cell Lung Cancer (NSCLC) - Squamous Cell Carcinoma Diagnosed in October 2021. Treated with chemotherapy and radiation followed by one year of durvalumab (Imfinzi) completed in December 2023. Currently on observation. Reports feeling good overall but continues to experience coughing, wheezing, and dyspnea. No new symptoms such as nausea, vomiting, diarrhea, or hemoptysis. Recent imaging reviewed showed no concerning findings, pending radiologist's report. - Await radiologist's report on recent scan - If no concerning findings, return for follow-up in six months with a scan one week prior to the visit - If concerning findings are noted, contact patient immediately  Chronic Cough and Wheezing Persistent symptoms of coughing and wheezing, producing clear sputum. No associated weight loss or hemoptysis. Symptoms likely related to underlying lung cancer and its treatment. - Monitor symptoms and report any changes or worsening.   The patient was advised to call immediately if he has any other concerning symptoms in the interval. The patient voices understanding of current disease status and treatment options and is in agreement with the current care plan.  All questions were answered. The patient knows to call the clinic with any problems, questions or concerns. We can certainly see the patient much sooner if necessary.   Disclaimer: This note was dictated with voice recognition software. Similar sounding words can inadvertently be transcribed and may not be corrected upon review.

## 2023-06-17 ENCOUNTER — Telehealth: Payer: Self-pay | Admitting: Internal Medicine

## 2023-06-17 NOTE — Telephone Encounter (Signed)
Scheduled patients appointment, patient has been made aware of all appointment details and I provided the patient with Radiology's phone number to schedule CT scan.

## 2023-07-06 ENCOUNTER — Ambulatory Visit: Payer: Medicare Other | Admitting: Urology

## 2023-07-06 ENCOUNTER — Encounter: Payer: Self-pay | Admitting: Urology

## 2023-07-06 VITALS — BP 120/72 | HR 74

## 2023-07-06 DIAGNOSIS — R351 Nocturia: Secondary | ICD-10-CM

## 2023-07-06 DIAGNOSIS — N2889 Other specified disorders of kidney and ureter: Secondary | ICD-10-CM | POA: Diagnosis not present

## 2023-07-06 DIAGNOSIS — N138 Other obstructive and reflux uropathy: Secondary | ICD-10-CM

## 2023-07-06 DIAGNOSIS — N401 Enlarged prostate with lower urinary tract symptoms: Secondary | ICD-10-CM | POA: Diagnosis not present

## 2023-07-06 MED ORDER — ALFUZOSIN HCL ER 10 MG PO TB24: 10.0000 mg | ORAL_TABLET | Freq: Two times a day (BID) | ORAL | 3 refills | Status: DC

## 2023-07-06 MED ORDER — ALFUZOSIN HCL ER 10 MG PO TB24: 10.0000 mg | ORAL_TABLET | Freq: Every day | ORAL | 11 refills | Status: DC

## 2023-07-06 NOTE — Patient Instructions (Signed)
 A Growth in the Kidney (Renal Mass): What to Know  A renal mass is an abnormal growth in the kidney. It may be found during an MRI, CT scan, or ultrasound that's done to check for other problems in the belly. Some renal masses are cancerous, or malignant, and can grow or spread quickly. Others are benign, which means they're not cancer. Renal masses include: Tumors. These may be either cancerous or benign. The most common cancerous tumor in adults is called renal cell carcinoma. In children, the most common type is Wilms tumor. The most common kidney tumors that aren't cancer include renal adenomas, oncocytomas, and angiomyolipomas (AML). Cysts. These are pockets of fluid that form on or in the kidney. What are the causes? Certain types of cancers, infections, or injuries can cause a renal mass. It's not always known what causes a cyst to form in or on the kidney. What are the signs or symptoms? Often, a renal mass or kidney cyst doesn't cause any signs or symptoms. How is this diagnosed? Your health care provider may suggest tests to diagnose the cause of your renal mass. These tests may include: A physical exam. Blood tests. Pee (urine) tests. Imaging tests, such as: CT scan. MRI. Ultrasound. Chest X-ray or bone scan. These may be done if a tumor is cancerous to see if the cancer has spread outside the kidney. Biopsy. This is when a small piece of tissue is removed from the renal mass for testing. How is this treated? Treatment is not always needed for a renal mass. Treatment will depend on the cause of the mass and if it's causing any problems or symptoms. For a cancerous renal mass, treatment options may include: Surgery. This is done to remove the tumor and any affected tissue. Chemotherapy. This uses medicines to kill cancer cells. Radiation. High-energy X-rays or gamma rays are used to kill cancer cells. Ablation. This uses extreme hot or cold temperature to kill the cancer  cells. Immunotherapy. Medicines are used to help the body's defense system (immune system) fight the cancer cells. Taking part in clinical trials. This involves trying new or experimental treatments to see if they're effective. Most kidney cysts don't need to be treated. Follow these instructions at home: What you need to do at home will depend on the cause of the mass. Follow the instructions that your provider gives you. In general: Take medicines only as told. If you were given antibiotics, take them as told. Do not stop taking them even if you start to feel better. Follow any instructions from your provider about what you can and can't do. Do not smoke, vape, or use nicotine or tobacco. Keep all follow-up visits. Your provider will need to check if your renal mass has changed or grown. Contact a health care provider if: You have flank pain, which is pain in your side or back. You have a fever. You have a loss of appetite. You have pain or swelling in your belly. You lose weight. Get help right away if: Your pain gets worse. There's blood in your pee. You can't pee. This information is not intended to replace advice given to you by your health care provider. Make sure you discuss any questions you have with your health care provider. Document Revised: 10/10/2022 Document Reviewed: 10/10/2022 Elsevier Patient Education  2024 [employer].

## 2023-07-06 NOTE — Progress Notes (Unsigned)
 07/06/2023 2:27 PM   Billy Hall 03/07/1946 [id number]  Referring provider: Assunta Found, MD [address] [place],  [place] [address]  No chief complaint on file.   HPI: Renal US shows stable left renal mass. IPSS 13 QOL 2 on uroxatral 10mg . Nocturia 1x. Urine stream strong.    PMH: Past Medical History:  Diagnosis Date   Arthritis    Depressive disorder    Diabetes mellitus without complication (HCC)    Fatigue    Headache(784.0)    Hyperlipidemia    Hypertension    Hypothyroidism    Snoring     Surgical History: Past Surgical History:  Procedure Laterality Date   COLONOSCOPY N/A 03/08/2013   Procedure: COLONOSCOPY;  Surgeon: Dalia Heading, MD;  Location: [facility];  Service: Gastroenterology;  Laterality: N/A;   IR IMAGING GUIDED PORT INSERTION  03/08/2020   IR US GUIDE BX ASP/DRAIN  03/08/2020   KNEE ARTHROSCOPY Right    2006 By Dr. Hilda Lias at [facility].    THYROID SURGERY     VIDEO BRONCHOSCOPY WITH ENDOBRONCHIAL ULTRASOUND N/A 01/30/2020   Procedure: VIDEO BRONCHOSCOPY WITH ENDOBRONCHIAL ULTRASOUND;  Surgeon: Loreli Slot, MD;  Location: [facility] OR;  Service: Thoracic;  Laterality: N/A;    Home Medications:  Allergies as of 07/06/2023       Reactions   Ace Inhibitors Swelling, Rash        Medication List        Accurate as of July 06, 2023  2:27 PM. If you have any questions, ask your nurse or doctor.          acetaminophen 650 MG CR tablet Commonly known as: TYLENOL Take 650 mg by mouth every 8 (eight) hours as needed for pain.   albuterol 108 (90 Base) MCG/ACT inhaler Commonly known as: VENTOLIN HFA Inhale 2 puffs into the lungs every 6 (six) hours as needed for wheezing or shortness of breath.   alfuzosin 10 MG 24 hr tablet Commonly known as: UROXATRAL Take 1 tablet (10 mg total) by mouth daily.   aspirin EC 81 MG tablet Take 81 mg by mouth daily.   cholecalciferol 25 MCG (1000 UNIT) tablet Commonly known as:  VITAMIN D3 Take 1,000 Units by mouth daily.   levothyroxine 100 MCG tablet Commonly known as: SYNTHROID Take 100 mcg by mouth daily.   losartan 50 MG tablet Commonly known as: COZAAR Take 50 mg by mouth daily.   metFORMIN 500 MG tablet Commonly known as: GLUCOPHAGE Take 500 mg by mouth 2 (two) times daily with a meal.   naproxen 500 MG tablet Commonly known as: Naprosyn Take 1 tablet (500 mg total) by mouth 2 (two) times daily with a meal.   OneTouch Ultra test strip Generic drug: glucose blood 1 each 2 (two) times daily.   sildenafil 20 MG tablet Commonly known as: REVATIO Take 20 mg by mouth daily as needed (ED).   simvastatin 10 MG tablet Commonly known as: ZOCOR Take 10 mg by mouth every evening.        Allergies:  Allergies  Allergen Reactions   Ace Inhibitors Swelling and Rash    Family History: Family History  Problem Relation Age of Onset   Alzheimer's disease Mother     Social History:  reports that he has quit smoking. He has never used smokeless tobacco. He reports that he does not currently use alcohol. He reports that he does not use drugs.  ROS: All other review of systems were  reviewed and are negative except what is noted above in HPI  Physical Exam: BP 120/72   Pulse 74   Constitutional:  Alert and oriented, No acute distress. HEENT: [place] AT, moist mucus membranes.  Trachea midline, no masses. Cardiovascular: No clubbing, cyanosis, or edema. Respiratory: Normal respiratory effort, no increased work of breathing. GI: Abdomen is soft, nontender, nondistended, no abdominal masses GU: No CVA tenderness.  Lymph: No cervical or inguinal lymphadenopathy. Skin: No rashes, bruises or suspicious lesions. Neurologic: Grossly intact, no focal deficits, moving all 4 extremities. Psychiatric: Normal mood and affect.  Laboratory Data: Lab Results  Component Value Date   WBC 4.7 06/10/2023   HGB 12.9 (L) 06/10/2023   HCT 40.2 06/10/2023   MCV  83.6 06/10/2023   PLT 176 06/10/2023    Lab Results  Component Value Date   CREATININE 0.78 06/10/2023    No results found for: "PSA"  No results found for: "TESTOSTERONE"  Lab Results  Component Value Date   HGBA1C 6.8 (H) 01/13/2022    Urinalysis    Component Value Date/Time   COLORURINE AMBER (A) 01/13/2022 0728   APPEARANCEUR Clear 10/10/2022 1140   LABSPEC 1.020 01/13/2022 0728   PHURINE 6.0 01/13/2022 0728   GLUCOSEU Negative 10/10/2022 1140   HGBUR MODERATE (A) 01/13/2022 0728   BILIRUBINUR Negative 10/10/2022 1140   KETONESUR 5 (A) 01/13/2022 0728   PROTEINUR Negative 10/10/2022 1140   PROTEINUR 100 (A) 01/13/2022 0728   NITRITE Negative 10/10/2022 1140   NITRITE NEGATIVE 01/13/2022 0728   LEUKOCYTESUR Negative 10/10/2022 1140   LEUKOCYTESUR NEGATIVE 01/13/2022 0728    Lab Results  Component Value Date   LABMICR Comment 10/10/2022   WBCUA 0-5 07/07/2022   LABEPIT 0-10 07/07/2022   MUCUS Present 11/28/2021   BACTERIA None seen 07/07/2022    Pertinent Imaging: *** No results found for this or any previous visit.  No results found for this or any previous visit.  No results found for this or any previous visit.  No results found for this or any previous visit.  Results for orders placed during the hospital encounter of 05/22/23  Ultrasound renal complete  Narrative CLINICAL DATA:  Left renal mass follow-up  EXAM: RENAL / URINARY TRACT ULTRASOUND COMPLETE  COMPARISON:  MRI abdomen 10/24/2022; renal ultrasound 10/07/2022  FINDINGS: Right Kidney:  Renal measurements: 12.0 x 6.9 x 6.1 cm = volume: 264.6 mL. Normal renal cortical thickness and echogenicity. There is a 1.5 cm cyst. No hydronephrosis.  Left Kidney:  Renal measurements: 11.2 x 7.2 x 5.1 cm = volume: 216 mL. Normal renal cortical thickness and echogenicity. No hydronephrosis. There is a 4.2 x 4.5 x 3.4 cm solid and cystic exophytic mass off the left kidney, similar to prior  where it measured 3.9 x 3.9 x 4.0 cm.  Bladder:  Appears normal for degree of bladder distention.  Other:  None.  IMPRESSION: 1. No significant change in size of solid and cystic exophytic mass off the left kidney, presumed to represent a renal cell carcinoma. 2. No hydronephrosis.   Electronically Signed By: Annia Belt M.D. On: 05/22/2023 13:43  No results found for this or any previous visit.  No results found for this or any previous visit.  Results for orders placed during the hospital encounter of 10/12/21  CT Renal Stone Study  Narrative CLINICAL DATA:  Flank pain. Kidney stones suspected. Burning with urination, urinary frequency, and right flank pain starting yesterday. History of right upper lung cancer.  EXAM: CT  ABDOMEN AND PELVIS WITHOUT CONTRAST  TECHNIQUE: Multidetector CT imaging of the abdomen and pelvis was performed following the standard protocol without IV contrast.  RADIATION DOSE REDUCTION: This exam was performed according to the departmental dose-optimization program which includes automated exposure control, adjustment of the mA and/or kV according to patient size and/or use of iterative reconstruction technique.  COMPARISON:  PET CT 02/06/2020  FINDINGS: Lower chest: Lung bases are clear.  Hepatobiliary: No focal liver abnormality is seen. No gallstones, gallbladder wall thickening, or biliary dilatation.  Pancreas: Unremarkable. No pancreatic ductal dilatation or surrounding inflammatory changes.  Spleen: Normal in size without focal abnormality.  Adrenals/Urinary Tract: No adrenal gland nodules. Kidneys are symmetrical in size and shape. No hydronephrosis or hydroureter. There is a 4 mm stone in the lower pole right kidney and a 3 mm stone in the lower pole left kidney. No ureteral stones or bladder stones are demonstrated. The bladder is decompressed.  Stomach/Bowel: Stomach, small bowel, and colon are not  abnormally distended. No wall thickening or inflammatory changes. Diverticulosis throughout the colon without evidence of acute diverticulitis. Appendix is normal.  Vascular/Lymphatic: Scattered aortic calcifications. No significant vascular findings are present. No enlarged abdominal or pelvic lymph nodes.  Reproductive: Prostate gland is not enlarged.  Other: Small periumbilical hernia containing fat. No free air or free fluid in the abdomen.  Musculoskeletal: No acute or significant osseous findings.  IMPRESSION: Nonobstructing intrarenal stones are demonstrated bilaterally. No ureteral stone or obstruction. Mild aortic atherosclerosis.   Electronically Signed By: Burman Nieves M.D. On: 10/12/2021 04:06   Assessment & Plan:    1. Left renal mass (Primary) Continue surveillance. Followup 6 months with renal US - Urinalysis, Routine w reflex microscopic  2. BPH with obstruction/lower urinary tract symptoms -increase uroxatral 10mg  BID  3. Nocturia Uroxatral 10mg  BID   No follow-ups on file.  Wilkie Aye, MD  [facility] 

## 2023-07-07 ENCOUNTER — Telehealth: Payer: Self-pay

## 2023-07-07 ENCOUNTER — Other Ambulatory Visit: Payer: Self-pay

## 2023-07-07 DIAGNOSIS — N138 Other obstructive and reflux uropathy: Secondary | ICD-10-CM

## 2023-07-07 LAB — MICROSCOPIC EXAMINATION
Bacteria, UA: NONE SEEN
RBC, Urine: NONE SEEN /HPF (ref 0–2)

## 2023-07-07 LAB — URINALYSIS, ROUTINE W REFLEX MICROSCOPIC
Bilirubin, UA: NEGATIVE
Ketones, UA: NEGATIVE
Nitrite, UA: NEGATIVE
Protein,UA: NEGATIVE
RBC, UA: NEGATIVE
Specific Gravity, UA: 1.02 (ref 1.005–1.030)
Urobilinogen, Ur: 0.2 mg/dL (ref 0.2–1.0)
pH, UA: 6 (ref 5.0–7.5)

## 2023-07-07 MED ORDER — ALFUZOSIN HCL ER 10 MG PO TB24: 10.0000 mg | ORAL_TABLET | Freq: Two times a day (BID) | ORAL | 3 refills | Status: DC

## 2023-07-07 NOTE — Telephone Encounter (Signed)
 Patient is requesting 90 day supply of alfuzosin (UROXATRAL) 10 MG 24 hr tablet.  Pharmacy has not received the refill request.

## 2023-07-07 NOTE — Telephone Encounter (Signed)
 Original Rx was printed in error, sent electronically

## 2023-07-09 DIAGNOSIS — J449 Chronic obstructive pulmonary disease, unspecified: Secondary | ICD-10-CM | POA: Diagnosis not present

## 2023-07-09 DIAGNOSIS — E559 Vitamin D deficiency, unspecified: Secondary | ICD-10-CM | POA: Diagnosis not present

## 2023-07-09 DIAGNOSIS — E782 Mixed hyperlipidemia: Secondary | ICD-10-CM | POA: Diagnosis not present

## 2023-07-09 DIAGNOSIS — E1159 Type 2 diabetes mellitus with other circulatory complications: Secondary | ICD-10-CM | POA: Diagnosis not present

## 2023-07-09 DIAGNOSIS — E039 Hypothyroidism, unspecified: Secondary | ICD-10-CM | POA: Diagnosis not present

## 2023-07-09 DIAGNOSIS — I10 Essential (primary) hypertension: Secondary | ICD-10-CM | POA: Diagnosis not present

## 2023-07-09 DIAGNOSIS — C3411 Malignant neoplasm of upper lobe, right bronchus or lung: Secondary | ICD-10-CM | POA: Diagnosis not present

## 2023-07-13 ENCOUNTER — Telehealth: Payer: Self-pay | Admitting: Internal Medicine

## 2023-07-13 NOTE — Telephone Encounter (Signed)
 Called the patient back to set up port flush's. The patient stated he wanted them done at [facility]. Sent a in basket message to them.

## 2023-07-30 ENCOUNTER — Inpatient Hospital Stay: Attending: Internal Medicine

## 2023-07-30 DIAGNOSIS — C3411 Malignant neoplasm of upper lobe, right bronchus or lung: Secondary | ICD-10-CM | POA: Diagnosis not present

## 2023-07-30 DIAGNOSIS — Z452 Encounter for adjustment and management of vascular access device: Secondary | ICD-10-CM | POA: Insufficient documentation

## 2023-07-30 DIAGNOSIS — Z79899 Other long term (current) drug therapy: Secondary | ICD-10-CM | POA: Diagnosis not present

## 2023-07-30 MED ORDER — SODIUM CHLORIDE 0.9% FLUSH
10.0000 mL | Freq: Once | INTRAVENOUS | Status: AC
  Administered 2023-07-30: 10 mL via INTRAVENOUS

## 2023-07-30 MED ORDER — HEPARIN SOD (PORK) LOCK FLUSH 100 UNIT/ML IV SOLN
500.0000 [IU] | Freq: Once | INTRAVENOUS | Status: AC
  Administered 2023-07-30: 500 [IU] via INTRAVENOUS

## 2023-07-30 NOTE — Progress Notes (Signed)
 Patients port flushed without difficulty.  Good blood return noted with no bruising or swelling noted at site.  Band aid applied.  VSS with discharge and left in satisfactory condition with no s/s of distress noted.

## 2023-07-30 NOTE — Patient Instructions (Signed)
 [facility] [place] - A DEPT OF [facility]  Discharge Instructions: Thank you for choosing [facility] to provide your oncology and hematology care.  If you have a lab appointment with the Cancer Center - please note that after April 8th, 2024, all labs will be drawn in the cancer center.  You do not have to check in or register with the main entrance as you have in the past but will complete your check-in in the cancer center.  Wear comfortable clothing and clothing appropriate for easy access to any Portacath or PICC line.   We strive to give you quality time with your provider. You may need to reschedule your appointment if you arrive late (15 or more minutes).  Arriving late affects you and other patients whose appointments are after yours.  Also, if you miss three or more appointments without notifying the office, you may be dismissed from the clinic at the provider's discretion.      For prescription refill requests, have your pharmacy contact our office and allow 72 hours for refills to be completed.    Today you received the following chemotherapy and/or immunotherapy agents port flush      To help prevent nausea and vomiting after your treatment, we encourage you to take your nausea medication as directed.  BELOW ARE SYMPTOMS THAT SHOULD BE REPORTED IMMEDIATELY: *FEVER GREATER THAN 100.4 F (38 C) OR HIGHER *CHILLS OR SWEATING *NAUSEA AND VOMITING THAT IS NOT CONTROLLED WITH YOUR NAUSEA MEDICATION *UNUSUAL SHORTNESS OF BREATH *UNUSUAL BRUISING OR BLEEDING *URINARY PROBLEMS (pain or burning when urinating, or frequent urination) *BOWEL PROBLEMS (unusual diarrhea, constipation, pain near the anus) TENDERNESS IN MOUTH AND THROAT WITH OR WITHOUT PRESENCE OF ULCERS (sore throat, sores in mouth, or a toothache) UNUSUAL RASH, SWELLING OR PAIN  UNUSUAL VAGINAL DISCHARGE OR ITCHING   Items with * indicate a potential emergency and should be followed  up as soon as possible or go to the Emergency Department if any problems should occur.  Please show the CHEMOTHERAPY ALERT CARD or IMMUNOTHERAPY ALERT CARD at check-in to the Emergency Department and triage nurse.  Should you have questions after your visit or need to cancel or reschedule your appointment, please contact [facility] [place] - A DEPT OF [facility] [phone]  and follow the prompts.  Office hours are 8:00 a.m. to 4:30 p.m. Monday - Friday. Please note that voicemails left after 4:00 p.m. may not be returned until the following business day.  We are closed weekends and major holidays. You have access to a nurse at all times for urgent questions. Please call the main number to the clinic [phone] and follow the prompts.  For any non-urgent questions, you may also contact your provider using MyChart. We now offer e-Visits for anyone 38 and older to request care online for non-urgent symptoms. For details visit [email or web address].   Also download the MyChart app! Go to the app store, search "MyChart", open the app, select [place], and log in with your MyChart username and password.

## 2023-08-26 DIAGNOSIS — E039 Hypothyroidism, unspecified: Secondary | ICD-10-CM | POA: Diagnosis not present

## 2023-08-26 DIAGNOSIS — E782 Mixed hyperlipidemia: Secondary | ICD-10-CM | POA: Diagnosis not present

## 2023-08-27 DIAGNOSIS — M170 Bilateral primary osteoarthritis of knee: Secondary | ICD-10-CM | POA: Diagnosis not present

## 2023-09-03 DIAGNOSIS — M170 Bilateral primary osteoarthritis of knee: Secondary | ICD-10-CM | POA: Diagnosis not present

## 2023-09-10 DIAGNOSIS — M170 Bilateral primary osteoarthritis of knee: Secondary | ICD-10-CM | POA: Diagnosis not present

## 2023-09-25 ENCOUNTER — Inpatient Hospital Stay: Attending: Internal Medicine

## 2023-09-25 VITALS — BP 125/81 | HR 72 | Temp 98.6°F | Resp 16

## 2023-09-25 DIAGNOSIS — C3490 Malignant neoplasm of unspecified part of unspecified bronchus or lung: Secondary | ICD-10-CM

## 2023-09-25 DIAGNOSIS — C3411 Malignant neoplasm of upper lobe, right bronchus or lung: Secondary | ICD-10-CM | POA: Insufficient documentation

## 2023-09-25 DIAGNOSIS — Z95828 Presence of other vascular implants and grafts: Secondary | ICD-10-CM

## 2023-09-25 LAB — CBC WITH DIFFERENTIAL/PLATELET
Abs Immature Granulocytes: 0.01 10*3/uL (ref 0.00–0.07)
Basophils Absolute: 0 10*3/uL (ref 0.0–0.1)
Basophils Relative: 1 %
Eosinophils Absolute: 0.1 10*3/uL (ref 0.0–0.5)
Eosinophils Relative: 2 %
HCT: 38.6 % — ABNORMAL LOW (ref 39.0–52.0)
Hemoglobin: 12.7 g/dL — ABNORMAL LOW (ref 13.0–17.0)
Immature Granulocytes: 0 %
Lymphocytes Relative: 22 %
Lymphs Abs: 0.9 10*3/uL (ref 0.7–4.0)
MCH: 27.9 pg (ref 26.0–34.0)
MCHC: 32.9 g/dL (ref 30.0–36.0)
MCV: 84.8 fL (ref 80.0–100.0)
Monocytes Absolute: 0.5 10*3/uL (ref 0.1–1.0)
Monocytes Relative: 13 %
Neutro Abs: 2.7 10*3/uL (ref 1.7–7.7)
Neutrophils Relative %: 62 %
Platelets: 181 10*3/uL (ref 150–400)
RBC: 4.55 MIL/uL (ref 4.22–5.81)
RDW: 15.1 % (ref 11.5–15.5)
WBC: 4.3 10*3/uL (ref 4.0–10.5)
nRBC: 0 % (ref 0.0–0.2)

## 2023-09-25 LAB — COMPREHENSIVE METABOLIC PANEL WITH GFR
ALT: 12 U/L (ref 0–44)
AST: 14 U/L — ABNORMAL LOW (ref 15–41)
Albumin: 3.3 g/dL — ABNORMAL LOW (ref 3.5–5.0)
Alkaline Phosphatase: 60 U/L (ref 38–126)
Anion gap: 11 (ref 5–15)
BUN: 16 mg/dL (ref 8–23)
CO2: 26 mmol/L (ref 22–32)
Calcium: 8.4 mg/dL — ABNORMAL LOW (ref 8.9–10.3)
Chloride: 100 mmol/L (ref 98–111)
Creatinine, Ser: 0.77 mg/dL (ref 0.61–1.24)
GFR, Estimated: >60 mL/min (ref >60)
Glucose, Bld: 103 mg/dL — ABNORMAL HIGH (ref 70–99)
Potassium: 4 mmol/L (ref 3.5–5.1)
Sodium: 137 mmol/L (ref 135–145)
Total Bilirubin: 0.6 mg/dL (ref 0.0–1.2)
Total Protein: 6.7 g/dL (ref 6.5–8.1)

## 2023-09-25 MED ORDER — SODIUM CHLORIDE 0.9% FLUSH
10.0000 mL | INTRAVENOUS | Status: DC | PRN
  Administered 2023-09-25: 10 mL via INTRAVENOUS

## 2023-09-25 MED ORDER — HEPARIN SOD (PORK) LOCK FLUSH 100 UNIT/ML IV SOLN
500.0000 [IU] | Freq: Once | INTRAVENOUS | Status: AC
  Administered 2023-09-25: 500 [IU] via INTRAVENOUS

## 2023-09-25 NOTE — Patient Instructions (Signed)
 [facility] [place] - A DEPT OF [facility]  Discharge Instructions: Thank you for choosing [facility] to provide your oncology and hematology care.  If you have a lab appointment with the Cancer Center - please note that after April 8th, 2024, all labs will be drawn in the cancer center.  You do not have to check in or register with the main entrance as you have in the past but will complete your check-in in the cancer center.  Wear comfortable clothing and clothing appropriate for easy access to any Portacath or PICC line.   We strive to give you quality time with your provider. You may need to reschedule your appointment if you arrive late (15 or more minutes).  Arriving late affects you and other patients whose appointments are after yours.  Also, if you miss three or more appointments without notifying the office, you may be dismissed from the clinic at the provider's discretion.      For prescription refill requests, have your pharmacy contact our office and allow 72 hours for refills to be completed.    Today you received the following done: port flush with labs   To help prevent nausea and vomiting after your treatment, we encourage you to take your nausea medication as directed.  BELOW ARE SYMPTOMS THAT SHOULD BE REPORTED IMMEDIATELY: *FEVER GREATER THAN 100.4 F (38 C) OR HIGHER *CHILLS OR SWEATING *NAUSEA AND VOMITING THAT IS NOT CONTROLLED WITH YOUR NAUSEA MEDICATION *UNUSUAL SHORTNESS OF BREATH *UNUSUAL BRUISING OR BLEEDING *URINARY PROBLEMS (pain or burning when urinating, or frequent urination) *BOWEL PROBLEMS (unusual diarrhea, constipation, pain near the anus) TENDERNESS IN MOUTH AND THROAT WITH OR WITHOUT PRESENCE OF ULCERS (sore throat, sores in mouth, or a toothache) UNUSUAL RASH, SWELLING OR PAIN  UNUSUAL VAGINAL DISCHARGE OR ITCHING   Items with * indicate a potential emergency and should be followed up as soon as possible or go  to the Emergency Department if any problems should occur.  Please show the CHEMOTHERAPY ALERT CARD or IMMUNOTHERAPY ALERT CARD at check-in to the Emergency Department and triage nurse.  Should you have questions after your visit or need to cancel or reschedule your appointment, please contact [facility]  - A DEPT OF [facility] [phone]  and follow the prompts.  Office hours are 8:00 a.m. to 4:30 p.m. Monday - Friday. Please note that voicemails left after 4:00 p.m. may not be returned until the following business day.  We are closed weekends and major holidays. You have access to a nurse at all times for urgent questions. Please call the main number to the clinic [phone] and follow the prompts.  For any non-urgent questions, you may also contact your provider using MyChart. We now offer e-Visits for anyone 50 and older to request care online for non-urgent symptoms. For details visit [email or web address].   Also download the MyChart app! Go to the app store, search "MyChart", open the app, select [place], and log in with your MyChart username and password.

## 2023-09-25 NOTE — Progress Notes (Signed)
 Billy Hall presented for Portacath access and flush. Proper placement of portacath confirmed by CXR. Portacath located right chest wall accessed with  H 20 needle. Good blood return present. Portacath flushed with 20ml NS and 500U/67ml Heparin  and needle removed intact. Procedure without incident. Patient tolerated procedure well.

## 2023-11-13 ENCOUNTER — Inpatient Hospital Stay: Attending: Internal Medicine

## 2023-11-13 VITALS — BP 121/66 | HR 85 | Temp 98.3°F | Resp 20

## 2023-11-13 DIAGNOSIS — Z452 Encounter for adjustment and management of vascular access device: Secondary | ICD-10-CM | POA: Insufficient documentation

## 2023-11-13 DIAGNOSIS — C3411 Malignant neoplasm of upper lobe, right bronchus or lung: Secondary | ICD-10-CM | POA: Diagnosis not present

## 2023-11-13 DIAGNOSIS — Z95828 Presence of other vascular implants and grafts: Secondary | ICD-10-CM

## 2023-11-13 MED ORDER — HEPARIN SOD (PORK) LOCK FLUSH 100 UNIT/ML IV SOLN
500.0000 [IU] | Freq: Once | INTRAVENOUS | Status: AC
  Administered 2023-11-13: 500 [IU] via INTRAVENOUS

## 2023-11-13 MED ORDER — SODIUM CHLORIDE FLUSH 0.9 % IV SOLN
10.0000 mL | Freq: Once | INTRAVENOUS | Status: AC
  Administered 2023-11-13: 10 mL via INTRAVENOUS
  Filled 2023-11-13: qty 10

## 2023-11-13 NOTE — Patient Instructions (Signed)
 [facility] [place] - A DEPT OF [facility]  Discharge Instructions: Thank you for choosing [facility] to provide your oncology and hematology care.  If you have a lab appointment with the Cancer Center - please note that after April 8th, 2024, all labs will be drawn in the cancer center.  You do not have to check in or register with the main entrance as you have in the past but will complete your check-in in the cancer center.  Wear comfortable clothing and clothing appropriate for easy access to any Portacath or PICC line.   We strive to give you quality time with your provider. You may need to reschedule your appointment if you arrive late (15 or more minutes).  Arriving late affects you and other patients whose appointments are after yours.  Also, if you miss three or more appointments without notifying the office, you may be dismissed from the clinic at the provider's discretion.      For prescription refill requests, have your pharmacy contact our office and allow 72 hours for refills to be completed.    Today you received the following Port flush, return as scheduled.   To help prevent nausea and vomiting after your treatment, we encourage you to take your nausea medication as directed.  BELOW ARE SYMPTOMS THAT SHOULD BE REPORTED IMMEDIATELY: *FEVER GREATER THAN 100.4 F (38 C) OR HIGHER *CHILLS OR SWEATING *NAUSEA AND VOMITING THAT IS NOT CONTROLLED WITH YOUR NAUSEA MEDICATION *UNUSUAL SHORTNESS OF BREATH *UNUSUAL BRUISING OR BLEEDING *URINARY PROBLEMS (pain or burning when urinating, or frequent urination) *BOWEL PROBLEMS (unusual diarrhea, constipation, pain near the anus) TENDERNESS IN MOUTH AND THROAT WITH OR WITHOUT PRESENCE OF ULCERS (sore throat, sores in mouth, or a toothache) UNUSUAL RASH, SWELLING OR PAIN  UNUSUAL VAGINAL DISCHARGE OR ITCHING   Items with * indicate a potential emergency and should be followed up as soon as possible  or go to the Emergency Department if any problems should occur.  Please show the CHEMOTHERAPY ALERT CARD or IMMUNOTHERAPY ALERT CARD at check-in to the Emergency Department and triage nurse.  Should you have questions after your visit or need to cancel or reschedule your appointment, please contact [facility] [place] - A DEPT OF [facility] [phone]  and follow the prompts.  Office hours are 8:00 a.m. to 4:30 p.m. Monday - Friday. Please note that voicemails left after 4:00 p.m. may not be returned until the following business day.  We are closed weekends and major holidays. You have access to a nurse at all times for urgent questions. Please call the main number to the clinic [phone] and follow the prompts.  For any non-urgent questions, you may also contact your provider using MyChart. We now offer e-Visits for anyone 80 and older to request care online for non-urgent symptoms. For details visit [email or web address].   Also download the MyChart app! Go to the app store, search MyChart, open the app, select , and log in with your MyChart username and password.

## 2023-11-13 NOTE — Progress Notes (Signed)
 Port flushed with good blood return noted. No bruising or swelling at site. Bandaid applied and patient discharged in satisfactory condition. VVS stable with no signs or symptoms of distressed noted.

## 2023-11-19 DIAGNOSIS — E1165 Type 2 diabetes mellitus with hyperglycemia: Secondary | ICD-10-CM | POA: Diagnosis not present

## 2023-11-19 DIAGNOSIS — I10 Essential (primary) hypertension: Secondary | ICD-10-CM | POA: Diagnosis not present

## 2023-11-19 DIAGNOSIS — E559 Vitamin D deficiency, unspecified: Secondary | ICD-10-CM | POA: Diagnosis not present

## 2023-11-19 DIAGNOSIS — J449 Chronic obstructive pulmonary disease, unspecified: Secondary | ICD-10-CM | POA: Diagnosis not present

## 2023-11-19 DIAGNOSIS — E039 Hypothyroidism, unspecified: Secondary | ICD-10-CM | POA: Diagnosis not present

## 2023-11-19 DIAGNOSIS — C3411 Malignant neoplasm of upper lobe, right bronchus or lung: Secondary | ICD-10-CM | POA: Diagnosis not present

## 2023-12-07 ENCOUNTER — Inpatient Hospital Stay: Attending: Internal Medicine

## 2023-12-07 ENCOUNTER — Ambulatory Visit (HOSPITAL_COMMUNITY)
Admission: RE | Admit: 2023-12-07 | Discharge: 2023-12-07 | Disposition: A | Discharge status: Home / Self Care | Payer: Medicare Other | Source: Ambulatory Visit | Attending: Internal Medicine | Admitting: Internal Medicine

## 2023-12-07 ENCOUNTER — Other Ambulatory Visit: Payer: Medicare Other

## 2023-12-07 DIAGNOSIS — E039 Hypothyroidism, unspecified: Secondary | ICD-10-CM | POA: Diagnosis not present

## 2023-12-07 DIAGNOSIS — Z79899 Other long term (current) drug therapy: Secondary | ICD-10-CM | POA: Diagnosis not present

## 2023-12-07 DIAGNOSIS — Z9226 Personal history of immune checkpoint inhibitor therapy: Secondary | ICD-10-CM | POA: Diagnosis not present

## 2023-12-07 DIAGNOSIS — R1310 Dysphagia, unspecified: Secondary | ICD-10-CM | POA: Insufficient documentation

## 2023-12-07 DIAGNOSIS — E785 Hyperlipidemia, unspecified: Secondary | ICD-10-CM | POA: Diagnosis not present

## 2023-12-07 DIAGNOSIS — C3490 Malignant neoplasm of unspecified part of unspecified bronchus or lung: Secondary | ICD-10-CM

## 2023-12-07 DIAGNOSIS — M25562 Pain in left knee: Secondary | ICD-10-CM | POA: Diagnosis not present

## 2023-12-07 DIAGNOSIS — Z9221 Personal history of antineoplastic chemotherapy: Secondary | ICD-10-CM | POA: Insufficient documentation

## 2023-12-07 DIAGNOSIS — J479 Bronchiectasis, uncomplicated: Secondary | ICD-10-CM | POA: Diagnosis not present

## 2023-12-07 DIAGNOSIS — M1990 Unspecified osteoarthritis, unspecified site: Secondary | ICD-10-CM | POA: Insufficient documentation

## 2023-12-07 DIAGNOSIS — N281 Cyst of kidney, acquired: Secondary | ICD-10-CM | POA: Insufficient documentation

## 2023-12-07 DIAGNOSIS — Z95828 Presence of other vascular implants and grafts: Secondary | ICD-10-CM

## 2023-12-07 DIAGNOSIS — I10 Essential (primary) hypertension: Secondary | ICD-10-CM | POA: Diagnosis not present

## 2023-12-07 DIAGNOSIS — Z7982 Long term (current) use of aspirin: Secondary | ICD-10-CM | POA: Diagnosis not present

## 2023-12-07 DIAGNOSIS — E119 Type 2 diabetes mellitus without complications: Secondary | ICD-10-CM | POA: Diagnosis not present

## 2023-12-07 DIAGNOSIS — M25561 Pain in right knee: Secondary | ICD-10-CM | POA: Insufficient documentation

## 2023-12-07 DIAGNOSIS — Z923 Personal history of irradiation: Secondary | ICD-10-CM | POA: Diagnosis not present

## 2023-12-07 DIAGNOSIS — Z79890 Hormone replacement therapy (postmenopausal): Secondary | ICD-10-CM | POA: Insufficient documentation

## 2023-12-07 DIAGNOSIS — C3411 Malignant neoplasm of upper lobe, right bronchus or lung: Secondary | ICD-10-CM | POA: Insufficient documentation

## 2023-12-07 LAB — CMP (CANCER CENTER ONLY)
ALT: 8 U/L (ref 0–44)
AST: 11 U/L — ABNORMAL LOW (ref 15–41)
Albumin: 3.9 g/dL (ref 3.5–5.0)
Alkaline Phosphatase: 55 U/L (ref 38–126)
Anion gap: 4 — ABNORMAL LOW (ref 5–15)
BUN: 16 mg/dL (ref 8–23)
CO2: 30 mmol/L (ref 22–32)
Calcium: 8.8 mg/dL — ABNORMAL LOW (ref 8.9–10.3)
Chloride: 104 mmol/L (ref 98–111)
Creatinine: 0.76 mg/dL (ref 0.61–1.24)
GFR, Estimated: >60 mL/min (ref >60)
Glucose, Bld: 107 mg/dL — ABNORMAL HIGH (ref 70–99)
Potassium: 4.3 mmol/L (ref 3.5–5.1)
Sodium: 138 mmol/L (ref 135–145)
Total Bilirubin: 0.7 mg/dL (ref 0.0–1.2)
Total Protein: 6.7 g/dL (ref 6.5–8.1)

## 2023-12-07 LAB — CBC WITH DIFFERENTIAL (CANCER CENTER ONLY)
Abs Immature Granulocytes: 0.01 K/uL (ref 0.00–0.07)
Basophils Absolute: 0 K/uL (ref 0.0–0.1)
Basophils Relative: 1 %
Eosinophils Absolute: 0.1 K/uL (ref 0.0–0.5)
Eosinophils Relative: 1 %
HCT: 38.8 % — ABNORMAL LOW (ref 39.0–52.0)
Hemoglobin: 12.5 g/dL — ABNORMAL LOW (ref 13.0–17.0)
Immature Granulocytes: 0 %
Lymphocytes Relative: 17 %
Lymphs Abs: 0.9 K/uL (ref 0.7–4.0)
MCH: 26.4 pg (ref 26.0–34.0)
MCHC: 32.2 g/dL (ref 30.0–36.0)
MCV: 81.9 fL (ref 80.0–100.0)
Monocytes Absolute: 0.5 K/uL (ref 0.1–1.0)
Monocytes Relative: 10 %
Neutro Abs: 3.7 K/uL (ref 1.7–7.7)
Neutrophils Relative %: 71 %
Platelet Count: 180 K/uL (ref 150–400)
RBC: 4.74 MIL/uL (ref 4.22–5.81)
RDW: 16.2 % — ABNORMAL HIGH (ref 11.5–15.5)
WBC Count: 5.1 K/uL (ref 4.0–10.5)
nRBC: 0 % (ref 0.0–0.2)

## 2023-12-07 MED ORDER — SODIUM CHLORIDE 0.9% FLUSH
10.0000 mL | Freq: Once | INTRAVENOUS | Status: AC
Start: 2023-12-07 — End: 2023-12-07
  Administered 2023-12-07 (×2): 10 mL

## 2023-12-07 MED ORDER — HEPARIN SOD (PORK) LOCK FLUSH 100 UNIT/ML IV SOLN
500.0000 [IU] | Freq: Once | INTRAVENOUS | Status: AC
  Administered 2023-12-07 (×2): 500 [IU] via INTRAVENOUS

## 2023-12-07 MED ORDER — IOHEXOL 300 MG/ML  SOLN
75.0000 mL | Freq: Once | INTRAMUSCULAR | Status: AC | PRN
  Administered 2023-12-07 (×2): 75 mL via INTRAVENOUS

## 2023-12-15 ENCOUNTER — Inpatient Hospital Stay: Admitting: Internal Medicine

## 2023-12-15 ENCOUNTER — Encounter: Payer: Self-pay | Admitting: Internal Medicine

## 2023-12-15 ENCOUNTER — Ambulatory Visit: Payer: Medicare Other | Admitting: Internal Medicine

## 2023-12-15 VITALS — BP 141/76 | HR 64 | Temp 97.7°F | Resp 17 | Ht 69.0 in | Wt 275.0 lb

## 2023-12-15 DIAGNOSIS — C3490 Malignant neoplasm of unspecified part of unspecified bronchus or lung: Secondary | ICD-10-CM | POA: Diagnosis not present

## 2023-12-15 DIAGNOSIS — C3411 Malignant neoplasm of upper lobe, right bronchus or lung: Secondary | ICD-10-CM | POA: Diagnosis not present

## 2023-12-15 DIAGNOSIS — Z923 Personal history of irradiation: Secondary | ICD-10-CM | POA: Diagnosis not present

## 2023-12-15 DIAGNOSIS — E039 Hypothyroidism, unspecified: Secondary | ICD-10-CM | POA: Diagnosis not present

## 2023-12-15 DIAGNOSIS — Z9221 Personal history of antineoplastic chemotherapy: Secondary | ICD-10-CM | POA: Diagnosis not present

## 2023-12-15 DIAGNOSIS — M1990 Unspecified osteoarthritis, unspecified site: Secondary | ICD-10-CM | POA: Diagnosis not present

## 2023-12-15 DIAGNOSIS — Z79899 Other long term (current) drug therapy: Secondary | ICD-10-CM | POA: Diagnosis not present

## 2023-12-15 DIAGNOSIS — Z7982 Long term (current) use of aspirin: Secondary | ICD-10-CM | POA: Diagnosis not present

## 2023-12-15 DIAGNOSIS — Z9226 Personal history of immune checkpoint inhibitor therapy: Secondary | ICD-10-CM | POA: Diagnosis not present

## 2023-12-15 DIAGNOSIS — E119 Type 2 diabetes mellitus without complications: Secondary | ICD-10-CM | POA: Diagnosis not present

## 2023-12-15 DIAGNOSIS — N281 Cyst of kidney, acquired: Secondary | ICD-10-CM | POA: Diagnosis not present

## 2023-12-15 DIAGNOSIS — J479 Bronchiectasis, uncomplicated: Secondary | ICD-10-CM | POA: Diagnosis not present

## 2023-12-15 DIAGNOSIS — E785 Hyperlipidemia, unspecified: Secondary | ICD-10-CM | POA: Diagnosis not present

## 2023-12-15 DIAGNOSIS — I10 Essential (primary) hypertension: Secondary | ICD-10-CM | POA: Diagnosis not present

## 2023-12-15 DIAGNOSIS — M25562 Pain in left knee: Secondary | ICD-10-CM | POA: Diagnosis not present

## 2023-12-15 DIAGNOSIS — M25561 Pain in right knee: Secondary | ICD-10-CM | POA: Diagnosis not present

## 2023-12-15 DIAGNOSIS — R1310 Dysphagia, unspecified: Secondary | ICD-10-CM | POA: Diagnosis not present

## 2023-12-15 NOTE — Progress Notes (Signed)
 [facility] [phone]   [phone]  OFFICE PROGRESS NOTE  Marvine Rush, MD [address] [place] [address]  DIAGNOSIS: Stage IIIA (T4, N0, M0) non-small cell lung cancer, squamous cell carcinoma presented with large right upper lobe lung mass diagnosed in October 2021.   PRIOR THERAPY:  1) Concurrent chemoradiation with weekly carboplatin  for AUC of 2 and paclitaxel  45 mg/M2. Status post 6 cycles. First dose on 02/27/20.  Last dose was given April 02, 2020. 2) Consolidation treatment with immunotherapy with Imfinzi  1500 mg IV every 4 weeks.  First dose May 10, 2020.  Status post 13 cycles.  CURRENT THERAPY: Observation  INTERVAL HISTORY: Billy Hall 78 y.o. male returns to the clinic today for 75-month follow-up visit.Discussed the use of AI scribe software for clinical note transcription with the patient, who gave verbal consent to proceed.  History of Present Illness Billy Hall is a 78 year old male with stage 3A non-small cell lung cancer who presents for evaluation with repeat CT scan of the chest for restaging of his disease.  He was diagnosed with stage 3A non-small cell lung cancer, squamous cell carcinoma, in October 2021. He completed concurrent chemoradiation with weekly carboplatin  and paclitaxel , followed by one year of consolidation treatment with immunotherapy using Imfinzi  every four weeks, which was completed in December 2023. Since then, he has been under observation.  He experiences persistent shortness of breath, particularly upon exertion. He also reports significant weight gain.  He has arthritis pain in his knees and throughout his body, which continues to be bothersome.  He is currently not under the care of a cardiologist and has never seen one before.     MEDICAL HISTORY: Past Medical History:  Diagnosis Date   Arthritis    Depressive disorder    Diabetes mellitus without  complication (HCC)    Fatigue    Headache(784.0)    Hyperlipidemia    Hypertension    Hypothyroidism    Snoring     ALLERGIES:  is allergic to ace inhibitors.  MEDICATIONS:  Current Outpatient Medications  Medication Sig Dispense Refill   acetaminophen  (TYLENOL ) 650 MG CR tablet Take 650 mg by mouth every 8 (eight) hours as needed for pain.     albuterol  (VENTOLIN  HFA) 108 (90 Base) MCG/ACT inhaler Inhale 2 puffs into the lungs every 6 (six) hours as needed for wheezing or shortness of breath. 8 g 2   alfuzosin  (UROXATRAL ) 10 MG 24 hr tablet Take 1 tablet (10 mg total) by mouth 2 (two) times daily. 180 tablet 3   aspirin  EC 81 MG tablet Take 81 mg by mouth daily.     cholecalciferol  (VITAMIN D3) 25 MCG (1000 UNIT) tablet Take 1,000 Units by mouth daily.     levothyroxine  (SYNTHROID ) 100 MCG tablet Take 100 mcg by mouth daily.     losartan  (COZAAR ) 50 MG tablet Take 50 mg by mouth daily.      metFORMIN (GLUCOPHAGE) 500 MG tablet Take 500 mg by mouth 2 (two) times daily with a meal.      naproxen  (NAPROSYN ) 500 MG tablet Take 1 tablet (500 mg total) by mouth 2 (two) times daily with a meal. 30 tablet 0   ONETOUCH ULTRA test strip 1 each 2 (two) times daily.     sildenafil (REVATIO) 20 MG tablet Take 20 mg by mouth daily as needed (ED).      simvastatin  (ZOCOR ) 10 MG tablet Take 10  mg by mouth every evening.      No current facility-administered medications for this visit.    SURGICAL HISTORY:  Past Surgical History:  Procedure Laterality Date   COLONOSCOPY N/A 03/08/2013   Procedure: COLONOSCOPY;  Surgeon: Oneil DELENA Budge, MD;  [facility];  Service: Gastroenterology;  Laterality: N/A;   IR IMAGING GUIDED PORT INSERTION  03/08/2020   IR US  GUIDE BX ASP/DRAIN  03/08/2020   KNEE ARTHROSCOPY Right    2006 By Dr. Brenna at [facility]    THYROID  SURGERY     VIDEO BRONCHOSCOPY WITH ENDOBRONCHIAL ULTRASOUND N/A 01/30/2020   Procedure: VIDEO BRONCHOSCOPY WITH ENDOBRONCHIAL  ULTRASOUND;  Surgeon: Kerrin Elspeth BROCKS, MD;  Location: [facility] OR;  Service: Thoracic;  Laterality: N/A;    REVIEW OF SYSTEMS:  Constitutional: positive for fatigue Eyes: negative Ears, nose, mouth, throat, and face: negative Respiratory: positive for dyspnea on exertion Cardiovascular: negative Gastrointestinal: negative Genitourinary:negative Integument/breast: negative Hematologic/lymphatic: negative Musculoskeletal:positive for arthralgias Neurological: negative Behavioral/Psych: negative Endocrine: negative Allergic/Immunologic: negative   PHYSICAL EXAMINATION: General appearance: alert, cooperative, and no distress Head: Normocephalic, without obvious abnormality, atraumatic Neck: no adenopathy, no JVD, supple, symmetrical, trachea midline, and thyroid  not enlarged, symmetric, no tenderness/mass/nodules Lymph nodes: Cervical, supraclavicular, and axillary nodes normal. Resp: clear to auscultation bilaterally Back: symmetric, no curvature. ROM normal. No CVA tenderness. Cardio: regular rate and rhythm, S1, S2 normal, no murmur, click, rub or gallop GI: soft, non-tender; bowel sounds normal; no masses,  no organomegaly Extremities: extremities normal, atraumatic, no cyanosis or edema Neurologic: Alert and oriented X 3, normal strength and tone. Normal symmetric reflexes. Normal coordination and gait  ECOG PERFORMANCE STATUS: 1 - Symptomatic but completely ambulatory  Blood pressure (!) 141/76, pulse 64, temperature 97.7 F (36.5 C), temperature source Temporal, resp. rate 17, height 5' 9 (1.753 m), weight 275 lb (124.7 kg), SpO2 100%.  LABORATORY DATA: Lab Results  Component Value Date   WBC 5.1 12/07/2023   HGB 12.5 (L) 12/07/2023   HCT 38.8 (L) 12/07/2023   MCV 81.9 12/07/2023   PLT 180 12/07/2023      Chemistry      Component Value Date/Time   NA 138 12/07/2023 1119   K 4.3 12/07/2023 1119   CL 104 12/07/2023 1119   CO2 30 12/07/2023 1119   BUN 16 12/07/2023  1119   CREATININE 0.76 12/07/2023 1119      Component Value Date/Time   CALCIUM 8.8 (L) 12/07/2023 1119   ALKPHOS 55 12/07/2023 1119   AST 11 (L) 12/07/2023 1119   ALT 8 12/07/2023 1119   BILITOT 0.7 12/07/2023 1119       RADIOGRAPHIC STUDIES: CT Chest W Contrast Result Date: 12/07/2023 CLINICAL DATA:  Non-small cell lung cancer, staging. * Tracking Code: BO * EXAM: CT CHEST WITH CONTRAST TECHNIQUE: Multidetector CT imaging of the chest was performed during intravenous contrast administration. RADIATION DOSE REDUCTION: This exam was performed according to the departmental dose-optimization program which includes automated exposure control, adjustment of the mA and/or kV according to patient size and/or use of iterative reconstruction technique. CONTRAST:  75mL OMNIPAQUE  IOHEXOL  300 MG/ML  SOLN COMPARISON:  Multiple priors including CT June 10, 2023. FINDINGS: Cardiovascular: Accessed right chest Port-A-Cath with tip near the superior cavoatrial junction. Hypodensity in the right atrial appendage on image 65/2 may reflect mixing artifact or thrombus. Suggest correlation with echocardiography. Enlarged main pulmonary artery Mediastinum/Nodes: No suspicious thyroid  nodule. No pathologically enlarged mediastinal, hilar or axillary lymph nodes. Prominent mediastinal/right hilar lymph nodes  are stable from prior examination for instance a right hilar lymph node measuring 9 mm in short axis on image 50/2. The esophagus is grossly unremarkable. Lungs/Pleura: Similar masslike consolidation with bronchiectasis/bronchiolectasis architectural distortion and linear bands extending out from this region in the right lung apex/suprahilar right lung. No new suspicious nodularity in this area. Similar scattered right-greater-than-left atelectasis versus scarring. No new suspicious pulmonary nodules or masses. Upper Abdomen: Right upper pole renal cyst. Adrenal glands appear normal. Musculoskeletal: Remote right  first, third and fourth rib fractures appear unchanged. No aggressive lytic or blastic lesion of bone. Multilevel degenerative change of the spine. IMPRESSION: 1. Similar postradiation change in the right suprahilar/apex. No new suspicious nodularity in this area. 2. No evidence of new or progressive disease in the chest. 3. Hypodensity in the right atrial appendage may reflect mixing artifact or thrombus. Suggest correlation with echocardiography. 4. Enlarged main pulmonary artery, which can be seen in the setting of pulmonary arterial hypertension. Electronically Signed   By: Reyes Holder M.D.   On: 12/07/2023 14:33    ASSESSMENT AND PLAN: This is a very pleasant 78 years old African-American male recently diagnosed with a stage IIIA non-small cell lung cancer, squamous cell carcinoma presented with large right upper lobe lung mass in October 2021. The patient underwent a course of concurrent chemoradiation with weekly carboplatin  and paclitaxel  status post 6 cycles.  The patient tolerated this course of treatment fairly well except for mild odynophagia. Imaging studies after the induction phase of concurrent chemoradiation showed interval decrease in the size and significant central cavitation of the previously demonstrated right upper lobe lung mass consistent with response to therapy. The patient underwent a course of consolidation treatment with immunotherapy with Imfinzi  1500 mg IV every 4 weeks status post 13 cycles.   The patient is currently on observation and he is feeling fine. He had repeat CT scan of the chest performed recently.  I personally and independently reviewed the scan and discussed the result with the patient today.  His scan showed no concerning findings for disease progression.  There was hypodensity in the right atrial appendage that may reflect mixing artifact or thrombus and recommendation for echocardiography was given. Assessment and Plan Assessment & Plan Stage 3A  non-small cell lung cancer, squamous cell carcinoma, status post chemoradiation and immunotherapy, currently without evidence of disease progression Stage 3A non-small cell lung cancer, squamous cell carcinoma, diagnosed in October 2021. Completed chemoradiation with carboplatin  and paclitaxel , followed by one year of consolidation immunotherapy with Imfinzi , completed in December 2023. Currently under observation with no evidence of disease progression on recent chest CT scan. - Continue observation - Schedule follow-up in six months  Possible right atrial thrombus (suspected) Possible right atrial thrombus identified as an artifact or clot in the right atrium on recent chest CT scan. Requires further evaluation with echocardiogram to rule out thrombus. No prior history of cardiac issues or cardiology consultations. - Discuss findings with primary care physician, Dr. Marvine to order 2D echo or referral to cardiology. - Consider referral to a cardiologist for further evaluation  The patient was advised to call immediately if he has any concerning symptoms in the interval. The patient voices understanding of current disease status and treatment options and is in agreement with the current care plan.  All questions were answered. The patient knows to call the clinic with any problems, questions or concerns. We can certainly see the patient much sooner if necessary.   Disclaimer: This note was dictated  with voice recognition software. Similar sounding words can inadvertently be transcribed and may not be corrected upon review.

## 2023-12-17 ENCOUNTER — Telehealth: Payer: Self-pay | Admitting: Internal Medicine

## 2023-12-17 NOTE — Telephone Encounter (Signed)
 Scheduled appointments with the patient per LOS.

## 2023-12-21 ENCOUNTER — Ambulatory Visit (HOSPITAL_COMMUNITY)
Admission: RE | Admit: 2023-12-21 | Discharge: 2023-12-21 | Disposition: A | Discharge status: Home / Self Care | Source: Ambulatory Visit | Attending: Urology | Admitting: Urology

## 2023-12-21 DIAGNOSIS — N2889 Other specified disorders of kidney and ureter: Secondary | ICD-10-CM | POA: Diagnosis not present

## 2024-01-13 ENCOUNTER — Ambulatory Visit: Admitting: Urology

## 2024-01-13 ENCOUNTER — Encounter: Payer: Self-pay | Admitting: Urology

## 2024-01-13 VITALS — BP 123/76 | HR 71

## 2024-01-13 DIAGNOSIS — N2889 Other specified disorders of kidney and ureter: Secondary | ICD-10-CM | POA: Diagnosis not present

## 2024-01-13 DIAGNOSIS — N138 Other obstructive and reflux uropathy: Secondary | ICD-10-CM

## 2024-01-13 DIAGNOSIS — R351 Nocturia: Secondary | ICD-10-CM | POA: Diagnosis not present

## 2024-01-13 DIAGNOSIS — N401 Enlarged prostate with lower urinary tract symptoms: Secondary | ICD-10-CM

## 2024-01-13 LAB — URINALYSIS, ROUTINE W REFLEX MICROSCOPIC
Bilirubin, UA: NEGATIVE
Glucose, UA: NEGATIVE
Ketones, UA: NEGATIVE
Leukocytes,UA: NEGATIVE
Nitrite, UA: NEGATIVE
Protein,UA: NEGATIVE
RBC, UA: NEGATIVE
Specific Gravity, UA: 1.02 (ref 1.005–1.030)
Urobilinogen, Ur: 1 mg/dL (ref 0.2–1.0)
pH, UA: 6 (ref 5.0–7.5)

## 2024-01-13 LAB — BLADDER SCAN AMB NON-IMAGING: Scan Result: 10

## 2024-01-13 MED ORDER — ALFUZOSIN HCL ER 10 MG PO TB24: 10.0000 mg | ORAL_TABLET | Freq: Two times a day (BID) | ORAL | 3 refills | Status: AC

## 2024-01-13 NOTE — Progress Notes (Signed)
 01/13/2024 2:15 PM   Billy Hall 1946-04-12 [id number]  Referring provider: Marvine Rush, MD [address] [place],  [place] [address]  No chief complaint on file.   HPI: Billy Hall is a 77yo here for followup for a left renal mass and BPH with Nocturia. IPSS 8 QOL 1 on uroxatral  10mg  BID. Nocturia 0-1x, no straining to urinate. Urine stream strong. Renal US  shoes increase in renal mass from 4.2cm to 5.6cm. No unexplained weight loss.    PMH: Past Medical History:  Diagnosis Date   Arthritis    Depressive disorder    Diabetes mellitus without complication (HCC)    Fatigue    Headache(784.0)    Hyperlipidemia    Hypertension    Hypothyroidism    Snoring     Surgical History: Past Surgical History:  Procedure Laterality Date   COLONOSCOPY N/A 03/08/2013   Procedure: COLONOSCOPY;  Surgeon: Oneil DELENA Budge, MD;  [facility];  Service: Gastroenterology;  Laterality: N/A;   IR IMAGING GUIDED PORT INSERTION  03/08/2020   IR US  GUIDE BX ASP/DRAIN  03/08/2020   KNEE ARTHROSCOPY Right    2006 By Dr. Brenna at [facility]    THYROID  SURGERY     VIDEO BRONCHOSCOPY WITH ENDOBRONCHIAL ULTRASOUND N/A 01/30/2020   Procedure: VIDEO BRONCHOSCOPY WITH ENDOBRONCHIAL ULTRASOUND;  Surgeon: Kerrin Elspeth BROCKS, MD;  Location: [facility];  Service: Thoracic;  Laterality: N/A;    Home Medications:  Allergies as of 01/13/2024       Reactions   Ace Inhibitors Swelling, Rash        Medication List        Accurate as of January 13, 2024  2:15 PM. If you have any questions, ask your nurse or doctor.          acetaminophen  650 MG CR tablet Commonly known as: TYLENOL  Take 650 mg by mouth every 8 (eight) hours as needed for pain.   albuterol  108 (90 Base) MCG/ACT inhaler Commonly known as: VENTOLIN  HFA Inhale 2 puffs into the lungs every 6 (six) hours as needed for wheezing or shortness of breath.   alfuzosin  10 MG 24 hr tablet Commonly known as:  UROXATRAL  Take 1 tablet (10 mg total) by mouth 2 (two) times daily.   aspirin  EC 81 MG tablet Take 81 mg by mouth daily.   cholecalciferol  25 MCG (1000 UNIT) tablet Commonly known as: VITAMIN D3 Take 1,000 Units by mouth daily.   levothyroxine  100 MCG tablet Commonly known as: SYNTHROID  Take 100 mcg by mouth daily.   losartan  50 MG tablet Commonly known as: COZAAR  Take 50 mg by mouth daily.   metFORMIN 500 MG tablet Commonly known as: GLUCOPHAGE Take 500 mg by mouth 2 (two) times daily with a meal.   naproxen  500 MG tablet Commonly known as: Naprosyn  Take 1 tablet (500 mg total) by mouth 2 (two) times daily with a meal.   OneTouch Ultra test strip Generic drug: glucose blood 1 each 2 (two) times daily.   sildenafil 20 MG tablet Commonly known as: REVATIO Take 20 mg by mouth daily as needed (ED).   simvastatin  10 MG tablet Commonly known as: ZOCOR  Take 10 mg by mouth every evening.        Allergies:  Allergies  Allergen Reactions   Ace Inhibitors Swelling and Rash    Family History: Family History  Problem Relation Age of Onset   Alzheimer's disease Mother     Social History:  reports that he has  quit smoking. He has never used smokeless tobacco. He reports that he does not currently use alcohol. He reports that he does not use drugs.  ROS: All other review of systems were reviewed and are negative except what is noted above in HPI  Physical Exam: BP 123/76   Pulse 71   Constitutional:  Alert and oriented, No acute distress. HEENT: [place] AT, moist mucus membranes.  Trachea midline, no masses. Cardiovascular: No clubbing, cyanosis, or edema. Respiratory: Normal respiratory effort, no increased work of breathing. GI: Abdomen is soft, nontender, nondistended, no abdominal masses GU: No CVA tenderness.  Lymph: No cervical or inguinal lymphadenopathy. Skin: No rashes, bruises or suspicious lesions. Neurologic: Grossly intact, no focal deficits, moving all  4 extremities. Psychiatric: Normal mood and affect.  Laboratory Data: Lab Results  Component Value Date   WBC 5.1 12/07/2023   HGB 12.5 (L) 12/07/2023   HCT 38.8 (L) 12/07/2023   MCV 81.9 12/07/2023   PLT 180 12/07/2023    Lab Results  Component Value Date   CREATININE 0.76 12/07/2023    No results found for: PSA  No results found for: TESTOSTERONE   Lab Results  Component Value Date   HGBA1C 6.8 (H) 01/13/2022    Urinalysis    Component Value Date/Time   COLORURINE AMBER (A) 01/13/2022 0728   APPEARANCEUR Clear 07/06/2023 1451   LABSPEC 1.020 01/13/2022 0728   PHURINE 6.0 01/13/2022 0728   GLUCOSEU Trace (A) 07/06/2023 1451   HGBUR MODERATE (A) 01/13/2022 0728   BILIRUBINUR Negative 07/06/2023 1451   KETONESUR 5 (A) 01/13/2022 0728   PROTEINUR Negative 07/06/2023 1451   PROTEINUR 100 (A) 01/13/2022 0728   NITRITE Negative 07/06/2023 1451   NITRITE NEGATIVE 01/13/2022 0728   LEUKOCYTESUR Trace (A) 07/06/2023 1451   LEUKOCYTESUR NEGATIVE 01/13/2022 0728    Lab Results  Component Value Date   LABMICR See below: 07/06/2023   WBCUA 0-5 07/06/2023   LABEPIT 0-10 07/06/2023   MUCUS Present 11/28/2021   BACTERIA None seen 07/06/2023    Pertinent Imaging: Renal US  12/21/2023: Images reviewed and discussed with the patient  No results found for this or any previous visit.  No results found for this or any previous visit.  No results found for this or any previous visit.  No results found for this or any previous visit.  Results for orders placed during the hospital encounter of 12/21/23  Ultrasound renal complete  Narrative EXAM: US  Retroperitoneum Complete, Renal.  CLINICAL HISTORY: Left renal mass.  TECHNIQUE: Real-time ultrasound of the retroperitoneum (complete) with image documentation.  COMPARISON: US  05/22/23.  FINDINGS:  RIGHT KIDNEY: The right kidney measures 10.8 x 6.2 x 5.0 cm. Volume 173 ml. There is a simple avascular cyst  measuring 1.7 cm. No follow-up of this lesion is required. Normal echogenicity. No hydronephrosis.  LEFT KIDNEY: The left kidney measures 11.8 x 5.7 x 5.7 cm. Volume 199 ml. Re-demonstrated complex cyst and solid mass measuring 4.8 x 5.6 x 5.5 cm. This has increased in size since the ultrasound 03/31/24, when it measured 4.2 x 4.5 x 3.4 cm. This is consistent with an increase in size. Normal echogenicity. No hydronephrosis.  BLADDER: Unremarkable bladder. Right ureteral jet was visualized. The left ureteral jet was not visualized.  IMPRESSION: 1. Increased size of the complex cystic and solid mass in the left kidney concerning for renal cell carcinoma  Electronically signed by: Norman Gatlin MD 01/02/2024 07:18 PM EDT RP Workstation: [id number]  No results found for this or any  previous visit.  No results found for this or any previous visit.  Results for orders placed during the hospital encounter of 10/12/21  CT Renal Stone Study  Narrative CLINICAL DATA:  Flank pain. Kidney stones suspected. Burning with urination, urinary frequency, and right flank pain starting yesterday. History of right upper lung cancer.  EXAM: CT ABDOMEN AND PELVIS WITHOUT CONTRAST  TECHNIQUE: Multidetector CT imaging of the abdomen and pelvis was performed following the standard protocol without IV contrast.  RADIATION DOSE REDUCTION: This exam was performed according to the departmental dose-optimization program which includes automated exposure control, adjustment of the mA and/or kV according to patient size and/or use of iterative reconstruction technique.  COMPARISON:  PET CT 02/06/2020  FINDINGS: Lower chest: Lung bases are clear.  Hepatobiliary: No focal liver abnormality is seen. No gallstones, gallbladder wall thickening, or biliary dilatation.  Pancreas: Unremarkable. No pancreatic ductal dilatation or surrounding inflammatory changes.  Spleen: Normal in size without focal  abnormality.  Adrenals/Urinary Tract: No adrenal gland nodules. Kidneys are symmetrical in size and shape. No hydronephrosis or hydroureter. There is a 4 mm stone in the lower pole right kidney and a 3 mm stone in the lower pole left kidney. No ureteral stones or bladder stones are demonstrated. The bladder is decompressed.  Stomach/Bowel: Stomach, small bowel, and colon are not abnormally distended. No wall thickening or inflammatory changes. Diverticulosis throughout the colon without evidence of acute diverticulitis. Appendix is normal.  Vascular/Lymphatic: Scattered aortic calcifications. No significant vascular findings are present. No enlarged abdominal or pelvic lymph nodes.  Reproductive: Prostate gland is not enlarged.  Other: Small periumbilical hernia containing fat. No free air or free fluid in the abdomen.  Musculoskeletal: No acute or significant osseous findings.  IMPRESSION: Nonobstructing intrarenal stones are demonstrated bilaterally. No ureteral stone or obstruction. Mild aortic atherosclerosis.   Electronically Signed By: Elsie Gravely M.D. On: 10/12/2021 04:06   Assessment & Plan:    1. Left renal mass (Primary) MRI abd - Urinalysis, Routine w reflex microscopic - BLADDER SCAN AMB NON-IMAGING  2. BPH with obstruction/lower urinary tract symptoms Continue uroxatral  10mg  qhs  3. Nocturia Continue uroxatral  10mg     No follow-ups on file.  Belvie Clara, MD  [facility] [place]

## 2024-01-19 ENCOUNTER — Encounter: Payer: Self-pay | Admitting: Urology

## 2024-01-19 ENCOUNTER — Ambulatory Visit (HOSPITAL_COMMUNITY)
Admission: RE | Admit: 2024-01-19 | Discharge: 2024-01-19 | Disposition: A | Discharge status: Home / Self Care | Source: Ambulatory Visit | Attending: Urology | Admitting: Urology

## 2024-01-19 DIAGNOSIS — N281 Cyst of kidney, acquired: Secondary | ICD-10-CM | POA: Diagnosis not present

## 2024-01-19 DIAGNOSIS — N2889 Other specified disorders of kidney and ureter: Secondary | ICD-10-CM | POA: Insufficient documentation

## 2024-01-19 DIAGNOSIS — K8689 Other specified diseases of pancreas: Secondary | ICD-10-CM | POA: Diagnosis not present

## 2024-01-19 DIAGNOSIS — R160 Hepatomegaly, not elsewhere classified: Secondary | ICD-10-CM | POA: Diagnosis not present

## 2024-01-19 MED ORDER — GADOBUTROL 1 MMOL/ML IV SOLN
10.0000 mL | Freq: Once | INTRAVENOUS | Status: AC | PRN
  Administered 2024-01-19: 10 mL via INTRAVENOUS

## 2024-01-19 NOTE — Patient Instructions (Signed)

## 2024-02-09 ENCOUNTER — Inpatient Hospital Stay: Attending: Internal Medicine

## 2024-02-29 ENCOUNTER — Telehealth: Payer: Self-pay | Admitting: Urology

## 2024-02-29 NOTE — Telephone Encounter (Signed)
 Tried calling pt with no answer and unable to LVM

## 2024-02-29 NOTE — Telephone Encounter (Signed)
 Patient returned call requesting MRI results.   Test was completed on 01/19/2024.   Best number to contact patient [phone] Call transferred to clinical basket.

## 2024-03-02 NOTE — Telephone Encounter (Signed)
 Per Dr. Sherrilee pt mass has not change in size and the Renal U/S was wrong in the enlargement. MRI confirmed Mass actual size has not changed. Pt made aware and voiced understanding

## 2024-03-02 NOTE — Telephone Encounter (Signed)
 Pt called in today requesting MRI results. Pt is made aware MRI has been routed to MD McKenzie for review. Pt is made aware that I will personally have MD review MRI and call back with MD recommendations. Pt voiced understanding

## 2024-03-03 ENCOUNTER — Ambulatory Visit: Attending: Internal Medicine | Admitting: Internal Medicine

## 2024-03-03 ENCOUNTER — Encounter: Payer: Self-pay | Admitting: Internal Medicine

## 2024-03-03 VITALS — BP 113/73 | Ht 69.0 in | Wt 268.0 lb

## 2024-03-03 DIAGNOSIS — Z136 Encounter for screening for cardiovascular disorders: Secondary | ICD-10-CM | POA: Diagnosis not present

## 2024-03-03 DIAGNOSIS — I272 Pulmonary hypertension, unspecified: Secondary | ICD-10-CM | POA: Diagnosis not present

## 2024-03-03 DIAGNOSIS — I513 Intracardiac thrombosis, not elsewhere classified: Secondary | ICD-10-CM | POA: Diagnosis not present

## 2024-03-03 MED ORDER — METOPROLOL TARTRATE 100 MG PO TABS: ORAL_TABLET | ORAL | 0 refills | Status: DC

## 2024-03-03 NOTE — Patient Instructions (Signed)
 Medication Instructions:  Your physician recommends that you continue on your current medications as directed. Please refer to the Current Medication list given to you today.  *If you need a refill on your cardiac medications before your next appointment, please call your pharmacy*  Lab Work: BMET  If you have labs (blood work) drawn today and your tests are completely normal, you will receive your results only by: MyChart Message (if you have MyChart) OR A paper copy in the mail If you have any lab test that is abnormal or we need to change your treatment, we will call you to review the results.  Testing/Procedures: Your physician has requested that you have an echocardiogram. Echocardiography is a painless test that uses sound waves to create images of your heart. It provides your doctor with information about the size and shape of your heart and how well your heart's chambers and valves are working. This procedure takes approximately one hour. There are no restrictions for this procedure. Please do NOT wear cologne, perfume, aftershave, or lotions (deodorant is allowed). Please arrive 15 minutes prior to your appointment time.  Please note: We ask at that you not bring children with you during ultrasound (echo/ vascular) testing. Due to room size and safety concerns, children are not allowed in the ultrasound rooms during exams. Our front office staff cannot provide observation of children in our lobby area while testing is being conducted. An adult accompanying a patient to their appointment will only be allowed in the ultrasound room at the discretion of the ultrasound technician under special circumstances. We apologize for any inconvenience.   Cardiac CTA  Follow-Up: At [facility], you and your health needs are our priority.  As part of our continuing mission to provide you with exceptional heart care, our providers are all part of one team.  This team includes your primary  Cardiologist (physician) and Advanced Practice Providers or APPs (Physician Assistants and Nurse Practitioners) who all work together to provide you with the care you need, when you need it.  Your next appointment:    Follow up to be determined based on testing results   Provider:   You may see Vishnu Mallipeddi, MD or the following Advanced Practice Provider on your designated Care Team:   Almarie Crate, NP    We recommend signing up for the patient portal called MyChart.  Sign up information is provided on this After Visit Summary.  MyChart is used to connect with patients for Virtual Visits (Telemedicine).  Patients are able to view lab/test results, encounter notes, upcoming appointments, etc.  Non-urgent messages can be sent to your provider as well.   To learn more about what you can do with MyChart, go to [email or web address].   Other Instructions  Your cardiac CT will be scheduled at  the below location:   [facility] [address]  [place], [place] [address]  If scheduled at the Heart and Vascular Tower at [address], please enter the parking lot using the [address] entrance and use the FREE valet service at the patient drop-off area. Enter the building and check-in with registration on the main floor.   Please follow these instructions carefully (unless otherwise directed):  An IV will be required for this test and Nitroglycerin will be given.  Hold all erectile dysfunction medications at least 3 days (72 hrs) prior to test. (Ie viagra, cialis, sildenafil, tadalafil, etc)   On the Night Before the Test: Be sure  to Drink plenty of water . Do not consume any caffeinated/decaffeinated beverages or chocolate 12 hours prior to your test. Do not take any antihistamines 12 hours prior to your test.  On the Day of the Test: Drink plenty of water  until 1 hour prior to the test. Do not eat any food 1 hour prior to test. You may take  your regular medications prior to the test.  Take metoprolol (Lopressor) two hours prior to test. If you take Furosemide/Hydrochlorothiazide/Spironolactone/Chlorthalidone, please HOLD on the morning of the test. Patients who wear a continuous glucose monitor MUST remove the device prior to scanning.   After the Test: Drink plenty of water . After receiving IV contrast, you may experience a mild flushed feeling. This is normal. On occasion, you may experience a mild rash up to 24 hours after the test. This is not dangerous. If this occurs, you can take Benadryl  25 mg, Zyrtec, Claritin, or Allegra and increase your fluid intake. (Patients taking Tikosyn should avoid Benadryl , and may take Zyrtec, Claritin, or Allegra) If you experience trouble breathing, this can be serious. If it is severe call 911 IMMEDIATELY. If it is mild, please call our office.  We will call to schedule your test 2-4 weeks out understanding that some insurance companies will need an authorization prior to the service being performed.   For more information and frequently asked questions, please visit our website : [email or web address]  For non-scheduling related questions, please contact the cardiac imaging nurse navigator should you have any questions/concerns: Cardiac Imaging Nurse Navigators Direct Office Dial: [phone]   For scheduling needs, including cancellations and rescheduling, please call Brittany, [phone].

## 2024-03-03 NOTE — Progress Notes (Signed)
 Cardiology Office Note  Date: 03/03/2024   ID: Trejuan, Matherne 06/03/1945, MRN [id number]  PCP:  Marvine Rush, MD  Cardiologist:  None Electrophysiologist:  None   History of Present Illness: JALIK GELLATLY is a 78 y.o. male  Referred to cardiology clinic for evaluation of abnormal CT findings.  Patient has history of lung cancer, underwent chemotherapy and radiation.  Follows with oncology at [facility].  He had CT chest with contrast in August 2024 that showed hypodensity in the right atrial appendage, thrombus versus mixing artifact.  This also showed enlarged pulmonary artery, indicative of pulmonary hypertension.  He reports having shortness of breath since he had COVID-19 infection in 2020.  He did not get worse.  He also reported having rattling sounds in his lungs after he was diagnosed with lung cancer.  He also started bringing up phlegm in the last few months.  Phlegm is clear.  Does not have any COPD history.  No angina, syncope, palpitations, leg swelling.  Past Medical History:  Diagnosis Date   Arthritis    Depressive disorder    Diabetes mellitus without complication (HCC)    Fatigue    Headache(784.0)    Hyperlipidemia    Hypertension    Hypothyroidism    Snoring     Past Surgical History:  Procedure Laterality Date   COLONOSCOPY N/A 03/08/2013   Procedure: COLONOSCOPY;  Surgeon: Oneil DELENA Budge, MD;  Location: [facility];  Service: Gastroenterology;  Laterality: N/A;   IR IMAGING GUIDED PORT INSERTION  03/08/2020   IR US  GUIDE BX ASP/DRAIN  03/08/2020   KNEE ARTHROSCOPY Right    2006 By Dr. Brenna at [facility].    THYROID  SURGERY     VIDEO BRONCHOSCOPY WITH ENDOBRONCHIAL ULTRASOUND N/A 01/30/2020   Procedure: VIDEO BRONCHOSCOPY WITH ENDOBRONCHIAL ULTRASOUND;  Surgeon: Kerrin Elspeth BROCKS, MD;  Location: [facility] OR;  Service: Thoracic;  Laterality: N/A;    Current Outpatient Medications  Medication Sig Dispense Refill   acetaminophen  (TYLENOL )  650 MG CR tablet Take 650 mg by mouth every 8 (eight) hours as needed for pain.     albuterol  (VENTOLIN  HFA) 108 (90 Base) MCG/ACT inhaler Inhale 2 puffs into the lungs every 6 (six) hours as needed for wheezing or shortness of breath. 8 g 2   alfuzosin  (UROXATRAL ) 10 MG 24 hr tablet Take 1 tablet (10 mg total) by mouth 2 (two) times daily. 180 tablet 3   aspirin  EC 81 MG tablet Take 81 mg by mouth daily.     cholecalciferol  (VITAMIN D3) 25 MCG (1000 UNIT) tablet Take 1,000 Units by mouth daily.     levothyroxine  (SYNTHROID ) 100 MCG tablet Take 100 mcg by mouth daily.     losartan  (COZAAR ) 50 MG tablet Take 50 mg by mouth daily.      magnesium oxide (MAG-OX) 400 (240 Mg) MG tablet Take 400 mg by mouth daily.     metFORMIN (GLUCOPHAGE) 500 MG tablet Take 500 mg by mouth 2 (two) times daily with a meal.      naproxen  (NAPROSYN ) 500 MG tablet Take 1 tablet (500 mg total) by mouth 2 (two) times daily with a meal. 30 tablet 0   ONETOUCH ULTRA test strip 1 each 2 (two) times daily.     sildenafil (REVATIO) 20 MG tablet Take 20 mg by mouth daily as needed (ED).      simvastatin  (ZOCOR ) 10 MG tablet Take 10 mg by mouth every evening.  No current facility-administered medications for this visit.   Allergies:  Ace inhibitors   Social History: The patient  reports that he has quit smoking. He has never used smokeless tobacco. He reports that he does not currently use alcohol. He reports that he does not use drugs.   Family History: The patient's family history includes Alzheimer's disease in his mother.   ROS:  Please see the history of present illness. Otherwise, complete review of systems is positive for none  All other systems are reviewed and negative.   Physical Exam: VS:  BP 113/73 (BP Location: Left Arm, Cuff Size: Large)   Ht 5' 9 (1.753 m)   Wt 268 lb (121.6 kg)   SpO2 96%   BMI 39.58 kg/m , BMI Body mass index is 39.58 kg/m.  Wt Readings from Last 3 Encounters:  03/03/24 268 lb  (121.6 kg)  12/15/23 275 lb (124.7 kg)  06/16/23 275 lb 11.2 oz (125.1 kg)    General: Patient appears comfortable at rest. HEENT: Conjunctiva and lids normal, oropharynx clear with moist mucosa. Neck: Supple, no elevated JVP or carotid bruits, no thyromegaly. Lungs: Clear to auscultation, nonlabored breathing at rest. Cardiac: Regular rate and rhythm, no S3 or significant systolic murmur, no pericardial rub. Abdomen: Soft, nontender, no hepatomegaly, bowel sounds present, no guarding or rebound. Extremities: No pitting edema, distal pulses 2+. Skin: Warm and dry. Musculoskeletal: No kyphosis. Neuropsychiatric: Alert and oriented x3, affect grossly appropriate.  Recent Labwork: 12/07/2023: ALT 8; AST 11; BUN 16; Creatinine 0.76; Hemoglobin 12.5; Platelet Count 180; Potassium 4.3; Sodium 138  No results found for: CHOL, TRIG, HDL, CHOLHDL, VLDL, LDLCALC, LDLDIRECT   Assessment and Plan:  Imaging evidence of hypodensity in the right atrial appendage, thrombus versus mixing artifact Imaging evidence of pulmonary hypertension - Has Port-A-Cath in place.  Has history of lung cancer, underwent chemotherapy and radiation.  Follows up with oncology in [place]. - He does not have any new symptoms of DOE, orthopnea, PND or any angina.  PE unlikely. - Obtain echocardiogram and CT cardiac.  HTN, controlled - Continue losartan  50 mg once daily.  40 minutes spent in reviewing prior medical records, specialist notes, more than 3 labs, discussion and documentation.     Medication Adjustments/Labs and Tests Ordered: Current medicines are reviewed at length with the patient today.  Concerns regarding medicines are outlined above.    Disposition:  Follow up pending results  Signed Hannan Tetzlaff Priya Radford Pease, MD, 03/03/2024 1:01 PM    [facility] at [facility] [address] [place], [place], [place] [address]

## 2024-03-08 ENCOUNTER — Telehealth: Payer: Self-pay

## 2024-03-08 NOTE — Telephone Encounter (Signed)
-----   Message from Belvie Clara sent at 03/08/2024 10:12 AM EST ----- MRi shows the mass has increased slightly ----- Message ----- From: Gretta Carlos SAUNDERS, CMA Sent: 03/02/2024   1:40 PM EST To: Belvie LITTIE Clara, MD  Please review. Pt want results

## 2024-03-08 NOTE — Telephone Encounter (Signed)
 Tried calling pt with no answer  and unable to lvmMRi shows the mass has increased slightly

## 2024-03-09 NOTE — Telephone Encounter (Signed)
 Pt made aware and voiced understanding.

## 2024-03-15 LAB — BASIC METABOLIC PANEL WITH GFR
BUN/Creatinine Ratio: 14 (ref 10–24)
BUN: 13 mg/dL (ref 8–27)
CO2: 24 mmol/L (ref 20–29)
Calcium: 9 mg/dL (ref 8.6–10.2)
Chloride: 102 mmol/L (ref 96–106)
Creatinine, Ser: 0.94 mg/dL (ref 0.76–1.27)
Glucose: 208 mg/dL — ABNORMAL HIGH (ref 70–99)
Potassium: 4.6 mmol/L (ref 3.5–5.2)
Sodium: 140 mmol/L (ref 134–144)
eGFR: 83 mL/min/1.73 (ref >59)

## 2024-03-17 ENCOUNTER — Ambulatory Visit

## 2024-03-18 ENCOUNTER — Ambulatory Visit (HOSPITAL_COMMUNITY)
Admission: RE | Admit: 2024-03-18 | Discharge: 2024-03-18 | Disposition: A | Discharge status: Home / Self Care | Source: Ambulatory Visit | Attending: Internal Medicine | Admitting: Internal Medicine

## 2024-03-18 DIAGNOSIS — I513 Intracardiac thrombosis, not elsewhere classified: Secondary | ICD-10-CM | POA: Insufficient documentation

## 2024-03-18 LAB — ECHOCARDIOGRAM COMPLETE
Area-P 1/2: 2.83 cm2
S' Lateral: 2.92 cm

## 2024-03-21 ENCOUNTER — Ambulatory Visit: Payer: Self-pay | Admitting: Internal Medicine

## 2024-03-21 ENCOUNTER — Telehealth (HOSPITAL_COMMUNITY): Payer: Self-pay | Admitting: *Deleted

## 2024-03-21 NOTE — Telephone Encounter (Signed)
 Reaching out to patient to offer assistance regarding upcoming cardiac imaging study; pt verbalizes understanding of appt date/time, parking situation and where to check in, pre-test NPO status and medications ordered, and verified current allergies; name and call back number provided for further questions should they arise  Chantal Requena RN Navigator Cardiac Imaging [facility] [phone] office [phone] cell  Patient to take 100mg  metoprolol  tartrate two hours prior to his cardiac CT scan. Patient did confirm that he had a port but was agreeable to us  starting and IV for the study.

## 2024-03-22 ENCOUNTER — Ambulatory Visit (HOSPITAL_COMMUNITY)
Admission: RE | Admit: 2024-03-22 | Discharge: 2024-03-22 | Disposition: A | Discharge status: Home / Self Care | Source: Ambulatory Visit | Attending: Internal Medicine | Admitting: Internal Medicine

## 2024-03-22 DIAGNOSIS — I2510 Atherosclerotic heart disease of native coronary artery without angina pectoris: Secondary | ICD-10-CM | POA: Diagnosis not present

## 2024-03-22 DIAGNOSIS — R918 Other nonspecific abnormal finding of lung field: Secondary | ICD-10-CM | POA: Diagnosis not present

## 2024-03-22 DIAGNOSIS — I77819 Aortic ectasia, unspecified site: Secondary | ICD-10-CM | POA: Diagnosis not present

## 2024-03-22 DIAGNOSIS — I281 Aneurysm of pulmonary artery: Secondary | ICD-10-CM | POA: Diagnosis not present

## 2024-03-22 DIAGNOSIS — I513 Intracardiac thrombosis, not elsewhere classified: Secondary | ICD-10-CM | POA: Diagnosis present

## 2024-03-22 DIAGNOSIS — I77810 Thoracic aortic ectasia: Secondary | ICD-10-CM | POA: Insufficient documentation

## 2024-03-22 DIAGNOSIS — I288 Other diseases of pulmonary vessels: Secondary | ICD-10-CM

## 2024-03-22 MED ORDER — IOHEXOL 350 MG/ML SOLN
100.0000 mL | Freq: Once | INTRAVENOUS | Status: AC | PRN
  Administered 2024-03-22: 100 mL via INTRAVENOUS

## 2024-03-22 MED ORDER — NITROGLYCERIN 0.4 MG SL SUBL
0.8000 mg | SUBLINGUAL_TABLET | Freq: Once | SUBLINGUAL | Status: AC
  Administered 2024-03-22: 0.8 mg via SUBLINGUAL

## 2024-03-30 ENCOUNTER — Telehealth: Payer: Self-pay

## 2024-03-30 NOTE — Telephone Encounter (Signed)
 Return call to pt. Pt called about upcoming appointment and wanted to confirmed appointment. Pt voiced understanding.

## 2024-04-05 ENCOUNTER — Inpatient Hospital Stay: Attending: Internal Medicine

## 2024-04-05 DIAGNOSIS — C3411 Malignant neoplasm of upper lobe, right bronchus or lung: Secondary | ICD-10-CM | POA: Diagnosis present

## 2024-04-05 DIAGNOSIS — Z452 Encounter for adjustment and management of vascular access device: Secondary | ICD-10-CM | POA: Insufficient documentation

## 2024-04-05 NOTE — Progress Notes (Signed)
 Port flushed with good blood return noted. No bruising or swelling at site. Bandaid applied and patient discharged in satisfactory condition. VVS stable with no signs or symptoms of distressed noted.

## 2024-04-05 NOTE — Patient Instructions (Signed)
 [facility] [place] - A DEPT OF [facility]  Discharge Instructions: Thank you for choosing [facility] to provide your oncology and hematology care.  If you have a lab appointment with the Cancer Center - please note that after April 8th, 2024, all labs will be drawn in the cancer center.  You do not have to check in or register with the main entrance as you have in the past but will complete your check-in in the cancer center.  Wear comfortable clothing and clothing appropriate for easy access to any Portacath or PICC line.   We strive to give you quality time with your provider. You may need to reschedule your appointment if you arrive late (15 or more minutes).  Arriving late affects you and other patients whose appointments are after yours.  Also, if you miss three or more appointments without notifying the office, you may be dismissed from the clinic at the provider's discretion.      For prescription refill requests, have your pharmacy contact our office and allow 72 hours for refills to be completed.    Today you received the following port flush, return as scheduled.   To help prevent nausea and vomiting after your treatment, we encourage you to take your nausea medication as directed.  BELOW ARE SYMPTOMS THAT SHOULD BE REPORTED IMMEDIATELY: *FEVER GREATER THAN 100.4 F (38 C) OR HIGHER *CHILLS OR SWEATING *NAUSEA AND VOMITING THAT IS NOT CONTROLLED WITH YOUR NAUSEA MEDICATION *UNUSUAL SHORTNESS OF BREATH *UNUSUAL BRUISING OR BLEEDING *URINARY PROBLEMS (pain or burning when urinating, or frequent urination) *BOWEL PROBLEMS (unusual diarrhea, constipation, pain near the anus) TENDERNESS IN MOUTH AND THROAT WITH OR WITHOUT PRESENCE OF ULCERS (sore throat, sores in mouth, or a toothache) UNUSUAL RASH, SWELLING OR PAIN  UNUSUAL VAGINAL DISCHARGE OR ITCHING   Items with * indicate a potential emergency and should be followed up as soon as possible  or go to the Emergency Department if any problems should occur.  Please show the CHEMOTHERAPY ALERT CARD or IMMUNOTHERAPY ALERT CARD at check-in to the Emergency Department and triage nurse.  Should you have questions after your visit or need to cancel or reschedule your appointment, please contact [facility] [place] - A DEPT OF [facility] [phone]  and follow the prompts.  Office hours are 8:00 a.m. to 4:30 p.m. Monday - Friday. Please note that voicemails left after 4:00 p.m. may not be returned until the following business day.  We are closed weekends and major holidays. You have access to a nurse at all times for urgent questions. Please call the main number to the clinic [phone] and follow the prompts.  For any non-urgent questions, you may also contact your provider using MyChart. We now offer e-Visits for anyone 88 and older to request care online for non-urgent symptoms. For details visit [email or web address].   Also download the MyChart app! Go to the app store, search MyChart, open the app, select , and log in with your MyChart username and password.

## 2024-04-06 ENCOUNTER — Ambulatory Visit: Admitting: Urology

## 2024-04-06 VITALS — BP 117/73 | HR 81

## 2024-04-06 DIAGNOSIS — R351 Nocturia: Secondary | ICD-10-CM

## 2024-04-06 DIAGNOSIS — N401 Enlarged prostate with lower urinary tract symptoms: Secondary | ICD-10-CM

## 2024-04-06 DIAGNOSIS — N2889 Other specified disorders of kidney and ureter: Secondary | ICD-10-CM | POA: Diagnosis not present

## 2024-04-06 DIAGNOSIS — N138 Other obstructive and reflux uropathy: Secondary | ICD-10-CM

## 2024-04-06 NOTE — Progress Notes (Unsigned)
 04/06/2024 3:06 PM   Billy Hall 18-May-1945 [id number]  Referring provider: Marvine Rush, MD [address] [place],  [place] [address]  No chief complaint on file.   HPI:    PMH: Past Medical History:  Diagnosis Date   Arthritis    Depressive disorder    Diabetes mellitus without complication (HCC)    Fatigue    Headache(784.0)    Hyperlipidemia    Hypertension    Hypothyroidism    Snoring     Surgical History: Past Surgical History:  Procedure Laterality Date   COLONOSCOPY N/A 03/08/2013   Procedure: COLONOSCOPY;  Surgeon: Oneil DELENA Budge, MD;  Location: [facility];  Service: Gastroenterology;  Laterality: N/A;   IR IMAGING GUIDED PORT INSERTION  03/08/2020   IR US  GUIDE BX ASP/DRAIN  03/08/2020   KNEE ARTHROSCOPY Right    2006 By Dr. Brenna at [facility].    THYROID  SURGERY     VIDEO BRONCHOSCOPY WITH ENDOBRONCHIAL ULTRASOUND N/A 01/30/2020   Procedure: VIDEO BRONCHOSCOPY WITH ENDOBRONCHIAL ULTRASOUND;  Surgeon: Kerrin Elspeth BROCKS, MD;  Location: [facility] OR;  Service: Thoracic;  Laterality: N/A;    Home Medications:  Allergies as of 04/06/2024       Reactions   Ace Inhibitors Swelling, Rash        Medication List        Accurate as of April 06, 2024  3:06 PM. If you have any questions, ask your nurse or doctor.          acetaminophen  650 MG CR tablet Commonly known as: TYLENOL  Take 650 mg by mouth every 8 (eight) hours as needed for pain.   albuterol  108 (90 Base) MCG/ACT inhaler Commonly known as: VENTOLIN  HFA Inhale 2 puffs into the lungs every 6 (six) hours as needed for wheezing or shortness of breath.   alfuzosin  10 MG 24 hr tablet Commonly known as: UROXATRAL  Take 1 tablet (10 mg total) by mouth 2 (two) times daily.   aspirin  EC 81 MG tablet Take 81 mg by mouth daily.   cholecalciferol  25 MCG (1000 UNIT) tablet Commonly known as: VITAMIN D3 Take 1,000 Units by mouth daily.   levothyroxine  100 MCG tablet Commonly known as:  SYNTHROID  Take 100 mcg by mouth daily.   losartan  50 MG tablet Commonly known as: COZAAR  Take 50 mg by mouth daily.   magnesium oxide 400 (240 Mg) MG tablet Commonly known as: MAG-OX Take 400 mg by mouth daily.   metFORMIN 500 MG tablet Commonly known as: GLUCOPHAGE Take 500 mg by mouth 2 (two) times daily with a meal.   metoprolol  tartrate 100 MG tablet Commonly known as: LOPRESSOR  Take 1 tablet by mouth 2 hours prior to CT Scan   naproxen  500 MG tablet Commonly known as: Naprosyn  Take 1 tablet (500 mg total) by mouth 2 (two) times daily with a meal.   OneTouch Ultra test strip Generic drug: glucose blood 1 each 2 (two) times daily.   sildenafil 20 MG tablet Commonly known as: REVATIO Take 20 mg by mouth daily as needed (ED).   simvastatin  10 MG tablet Commonly known as: ZOCOR  Take 10 mg by mouth every evening.        Allergies:  Allergies  Allergen Reactions   Ace Inhibitors Swelling and Rash    Family History: Family History  Problem Relation Age of Onset   Alzheimer's disease Mother     Social History:  reports that he has quit smoking. He has never  used smokeless tobacco. He reports that he does not currently use alcohol. He reports that he does not use drugs.  ROS: All other review of systems were reviewed and are negative except what is noted above in HPI  Physical Exam: BP 117/73   Pulse 81   Constitutional:  Alert and oriented, No acute distress. HEENT: [place] AT, moist mucus membranes.  Trachea midline, no masses. Cardiovascular: No clubbing, cyanosis, or edema. Respiratory: Normal respiratory effort, no increased work of breathing. GI: Abdomen is soft, nontender, nondistended, no abdominal masses GU: No CVA tenderness.  Lymph: No cervical or inguinal lymphadenopathy. Skin: No rashes, bruises or suspicious lesions. Neurologic: Grossly intact, no focal deficits, moving all 4 extremities. Psychiatric: Normal mood and affect.  Laboratory  Data: Lab Results  Component Value Date   WBC 5.1 12/07/2023   HGB 12.5 (L) 12/07/2023   HCT 38.8 (L) 12/07/2023   MCV 81.9 12/07/2023   PLT 180 12/07/2023    Lab Results  Component Value Date   CREATININE 0.94 03/14/2024    No results found for: PSA  No results found for: TESTOSTERONE   Lab Results  Component Value Date   HGBA1C 6.8 (H) 01/13/2022    Urinalysis    Component Value Date/Time   COLORURINE AMBER (A) 01/13/2022 0728   APPEARANCEUR Clear 01/13/2024 1342   LABSPEC 1.020 01/13/2022 0728   PHURINE 6.0 01/13/2022 0728   GLUCOSEU Negative 01/13/2024 1342   HGBUR MODERATE (A) 01/13/2022 0728   BILIRUBINUR Negative 01/13/2024 1342   KETONESUR 5 (A) 01/13/2022 0728   PROTEINUR Negative 01/13/2024 1342   PROTEINUR 100 (A) 01/13/2022 0728   NITRITE Negative 01/13/2024 1342   NITRITE NEGATIVE 01/13/2022 0728   LEUKOCYTESUR Negative 01/13/2024 1342   LEUKOCYTESUR NEGATIVE 01/13/2022 0728    Lab Results  Component Value Date   LABMICR Comment 01/13/2024   WBCUA 0-5 07/06/2023   LABEPIT 0-10 07/06/2023   MUCUS Present 11/28/2021   BACTERIA None seen 07/06/2023    Pertinent Imaging: *** No results found for this or any previous visit.  No results found for this or any previous visit.  No results found for this or any previous visit.  No results found for this or any previous visit.  Results for orders placed during the hospital encounter of 12/21/23  Ultrasound renal complete  Narrative EXAM: US  Retroperitoneum Complete, Renal.  CLINICAL HISTORY: Left renal mass.  TECHNIQUE: Real-time ultrasound of the retroperitoneum (complete) with image documentation.  COMPARISON: US  05/22/23.  FINDINGS:  RIGHT KIDNEY: The right kidney measures 10.8 x 6.2 x 5.0 cm. Volume 173 ml. There is a simple avascular cyst measuring 1.7 cm. No follow-up of this lesion is required. Normal echogenicity. No hydronephrosis.  LEFT KIDNEY: The left kidney  measures 11.8 x 5.7 x 5.7 cm. Volume 199 ml. Re-demonstrated complex cyst and solid mass measuring 4.8 x 5.6 x 5.5 cm. This has increased in size since the ultrasound 03/31/24, when it measured 4.2 x 4.5 x 3.4 cm. This is consistent with an increase in size. Normal echogenicity. No hydronephrosis.  BLADDER: Unremarkable bladder. Right ureteral jet was visualized. The left ureteral jet was not visualized.  IMPRESSION: 1. Increased size of the complex cystic and solid mass in the left kidney concerning for renal cell carcinoma  Electronically signed by: Norman Gatlin MD 01/02/2024 07:18 PM EDT RP Workstation: [id number]  No results found for this or any previous visit.  No results found for this or any previous visit.  Results for orders placed during  the hospital encounter of 10/12/21  CT Renal Stone Study  Narrative CLINICAL DATA:  Flank pain. Kidney stones suspected. Burning with urination, urinary frequency, and right flank pain starting yesterday. History of right upper lung cancer.  EXAM: CT ABDOMEN AND PELVIS WITHOUT CONTRAST  TECHNIQUE: Multidetector CT imaging of the abdomen and pelvis was performed following the standard protocol without IV contrast.  RADIATION DOSE REDUCTION: This exam was performed according to the departmental dose-optimization program which includes automated exposure control, adjustment of the mA and/or kV according to patient size and/or use of iterative reconstruction technique.  COMPARISON:  PET CT 02/06/2020  FINDINGS: Lower chest: Lung bases are clear.  Hepatobiliary: No focal liver abnormality is seen. No gallstones, gallbladder wall thickening, or biliary dilatation.  Pancreas: Unremarkable. No pancreatic ductal dilatation or surrounding inflammatory changes.  Spleen: Normal in size without focal abnormality.  Adrenals/Urinary Tract: No adrenal gland nodules. Kidneys are symmetrical in size and shape. No hydronephrosis or  hydroureter. There is a 4 mm stone in the lower pole right kidney and a 3 mm stone in the lower pole left kidney. No ureteral stones or bladder stones are demonstrated. The bladder is decompressed.  Stomach/Bowel: Stomach, small bowel, and colon are not abnormally distended. No wall thickening or inflammatory changes. Diverticulosis throughout the colon without evidence of acute diverticulitis. Appendix is normal.  Vascular/Lymphatic: Scattered aortic calcifications. No significant vascular findings are present. No enlarged abdominal or pelvic lymph nodes.  Reproductive: Prostate gland is not enlarged.  Other: Small periumbilical hernia containing fat. No free air or free fluid in the abdomen.  Musculoskeletal: No acute or significant osseous findings.  IMPRESSION: Nonobstructing intrarenal stones are demonstrated bilaterally. No ureteral stone or obstruction. Mild aortic atherosclerosis.   Electronically Signed By: Elsie Gravely M.D. On: 10/12/2021 04:06   Assessment & Plan:    1. Left renal mass (Primary) We discussed the natural hx of renal masses and the 80/20 malignant/benign likelihood. We disucssed the treatment options including active surveillance. Renal ablation, partial and radical nephrectomy.  - Urinalysis, Routine w reflex microscopic  2. BPH with obstruction/lower urinary tract symptoms ***  3. Nocturia ***   No follow-ups on file.  Belvie Clara, MD  [facility] [place]

## 2024-04-07 ENCOUNTER — Encounter: Payer: Self-pay | Admitting: Urology

## 2024-04-07 ENCOUNTER — Encounter: Payer: Self-pay | Admitting: Radiology

## 2024-04-07 LAB — URINALYSIS, ROUTINE W REFLEX MICROSCOPIC
Bilirubin, UA: NEGATIVE
Glucose, UA: NEGATIVE
Ketones, UA: NEGATIVE
Leukocytes,UA: NEGATIVE
Nitrite, UA: NEGATIVE
Protein,UA: NEGATIVE
RBC, UA: NEGATIVE
Specific Gravity, UA: 1.01 (ref 1.005–1.030)
Urobilinogen, Ur: 2 mg/dL — ABNORMAL HIGH (ref 0.2–1.0)
pH, UA: 8.5 — ABNORMAL HIGH (ref 5.0–7.5)

## 2024-04-07 NOTE — Progress Notes (Unsigned)
 Jennefer Ester PARAS, MD  Daralene Ferol FALCON, RT PROCEDURE / BIOPSY REVIEW Date: 04/07/2024  Requested Biopsy site: Left renal mass Reason for request: concern for malignancy Imaging review: Best seen on MRI 01/19/24, series 9, image 32 (DWI)  Decision: Approved Imaging modality to perform: Ultrasound - recommend with contrast enhanced ultrasound Schedule with: Moderate Sedation Schedule for: Any VIR  Additional comments: none  Please contact me with questions, concerns, or if issue pertaining to this request arise.  Ester PARAS Jennefer, MD [facility]       Previous Messages    ----- Message ----- From: Daralene Ferol FALCON, RT Sent: 04/06/2024   3:22 PM EST To: Ir Procedure Requests Subject: CT US  GUIDED BIOPSY                            Procedure : CT US  GUIDED BIOPSY  Reason :left renal mass Dx: Left renal mass [N28.89 (ICD-10-CM)]  History :MR ABDOMEN W WO CONTRAST (Accession [id number]) (Order [id number]), Ultrasound renal complete (Accession [id number]) (Order [id number]), CT Chest W Contrast (Accession [id number]) (Order [id number]), CT Chest W Contrast (Accession [id number]) (Order [id number]), Ultrasound renal complete (Accession [id number]) (Order [id number])  Provider: Sherrilee Belvie CROME, MD  Provider contact ; [phone]

## 2024-04-11 ENCOUNTER — Other Ambulatory Visit: Payer: Self-pay | Admitting: Surgery

## 2024-04-11 DIAGNOSIS — D490 Neoplasm of unspecified behavior of digestive system: Secondary | ICD-10-CM

## 2024-04-12 ENCOUNTER — Other Ambulatory Visit: Payer: Self-pay

## 2024-04-12 ENCOUNTER — Telehealth: Payer: Self-pay

## 2024-04-12 ENCOUNTER — Other Ambulatory Visit: Payer: Self-pay | Admitting: Urology

## 2024-04-12 ENCOUNTER — Telehealth: Payer: Self-pay | Admitting: Internal Medicine

## 2024-04-12 ENCOUNTER — Encounter

## 2024-04-12 ENCOUNTER — Encounter: Payer: Self-pay | Admitting: Radiology

## 2024-04-12 DIAGNOSIS — N2889 Other specified disorders of kidney and ureter: Secondary | ICD-10-CM

## 2024-04-12 NOTE — Progress Notes (Unsigned)
 Baldwin Channing LITTIE Tommie, Tiffany; [place], Hartley F, RT I have called and left a v/m for hem. And did left hem know someone would call to schedule the consult.       Previous Messages    ----- Message ----- From: Tommie Riggs Sent: 04/12/2024   8:45 AM EST To: Channing LITTIE Baldwin; Ferol JULIANNA Conger, RT  Please cancel, clinic will get him scheduled for consult.   In the future if you see anything that says renal cryo, microwave or ablation please reach out to doctor for clarification.  I know they entered CT US  BX but he wrote in comments renal ablation that should be a red flag to question order before proceeding.  Thanks! ----- Message ----- From: Baldwin Channing LITTIE Sent: 04/12/2024   8:30 AM EST To: Riggs Tommie; Ferol JULIANNA Conger, RT  GM Tiffany,    So do we need to just cx the appt. And do we need to change the order? Sorry I have not ran in to this before that why I am asking.  Jim ----- Message ----- From: Tommie Riggs Sent: 04/12/2024   8:15 AM EST To: Boby Bers; Delon [place]oc[place]; Channing LITTIE*  This is for consult renal ablation no biopsy per Dr.McKenzie, thank you! ----- Message ----- From: Sherrilee Belvie LITTIE, MD Sent: 04/12/2024   8:08 AM EST To: Riggs Tommie  I would have him seen for consideration of renal ablation ----- Message ----- From: Tommie Riggs Sent: 04/07/2024   2:09 PM EST To: Belvie LITTIE Sherrilee, MD  Dr.McKenzie,  We placed the renal biopsy in review and are happy to proceed with scheduling but wanted to confirm that is what you wanted.  In the comments you did state renal ablation, if that is the case we would see patient at clinic.  If you can just confirm so we can ensure we are schedule\ing the correct thing, I did look at your note form yesterday but it's not complete.  Thanks, Tiffany [facility] IR Scheduler- [facility] [phone]

## 2024-04-12 NOTE — Telephone Encounter (Signed)
 [facility] called requesting an order to access and deaccess port for CT scan. Order given per [facility].

## 2024-04-12 NOTE — Telephone Encounter (Signed)
Patient is returning call to review CT results.

## 2024-04-12 NOTE — Telephone Encounter (Signed)
 Pt returning call

## 2024-04-13 NOTE — Telephone Encounter (Signed)
-----   Message from Vishnu Mallipeddi, MD sent at 04/07/2024  1:16 PM EST ----- Coronary calcium score is 168, 57th percentile for age and sex matched control.  Mild nonobstructive CAD is present.  No obvious thrombus within the right atrium but accuracy is limited, recommended  TEE or cardiac MRI.  Schedule follow-up visit with APP/MD to set him up for TEE to rule out right atrial thrombus.

## 2024-04-13 NOTE — Telephone Encounter (Signed)
 Patient notified of result.  Please refer to phone note from today for complete details.   [name], CMA 04/13/2024 9:00 AM

## 2024-04-13 NOTE — Telephone Encounter (Signed)
 Pt requesting a c/b to discuss results.

## 2024-04-13 NOTE — Telephone Encounter (Signed)
 The patient has been notified of the result and verbalized understanding.  All questions (if any) were answered. Advised him that someone from scheduling will reach out to him to make an appointment  [name], CMA 04/13/2024 8:58 AM

## 2024-04-14 ENCOUNTER — Encounter: Payer: Self-pay | Admitting: Physician Assistant

## 2024-04-14 ENCOUNTER — Ambulatory Visit: Admitting: Physician Assistant

## 2024-04-14 ENCOUNTER — Other Ambulatory Visit: Payer: Self-pay | Admitting: Physician Assistant

## 2024-04-14 VITALS — BP 122/66 | HR 72 | Ht 69.0 in | Wt 271.0 lb

## 2024-04-14 DIAGNOSIS — I10 Essential (primary) hypertension: Secondary | ICD-10-CM | POA: Diagnosis not present

## 2024-04-14 DIAGNOSIS — E785 Hyperlipidemia, unspecified: Secondary | ICD-10-CM | POA: Diagnosis not present

## 2024-04-14 DIAGNOSIS — I272 Pulmonary hypertension, unspecified: Secondary | ICD-10-CM

## 2024-04-14 DIAGNOSIS — I513 Intracardiac thrombosis, not elsewhere classified: Secondary | ICD-10-CM

## 2024-04-14 DIAGNOSIS — I2510 Atherosclerotic heart disease of native coronary artery without angina pectoris: Secondary | ICD-10-CM | POA: Diagnosis not present

## 2024-04-14 NOTE — H&P (View-Only) (Signed)
 Cardiology Office Note:  .   Date:  04/14/2024  ID:  Billy Hall, DOB 08-22-1945, MRN [id number] PCP: Marvine Rush, MD   HeartCare Providers Cardiologist:  Diannah SHAUNNA Maywood, MD {  History of Present Illness: .   Billy Hall is a 78 y.o. male  with PMHx of lung cancer (s/p chemotherapy and radiation; Follows with oncology at [facility]), CAD, HLD, HTN who reports to [facility] office for follow up.   Pertinent cardiac medical history:  CT chest with contrast 11/2022: hypodensity in the right atrial appendage, thrombus versus mixing artifact. This also showed enlarged pulmonary artery, indicative of pulmonary hypertension.  Echo 03/18/2024: EF 55 to 60%, normal LV/RV function, abnormal global longitudinal strain at -10.6% CCTA 03/22/2024: CAS is 168, 57th percentile for age and sex matched control. Mild nonobstructive CAD is present. Main pulmonary artery is dilated (40 mm), suggestive of increased pulmonary pressures. Ascending aortic dilation, 40.54mm at the level of the PA bifurcation. No obvious thrombus within RA but limited accuracy, therefore recommended TEE or cardiac MRI.    First seen in heart care on 03/03/2024 by Dr. Mallipeddi for evaluation of abnormal CT finding 11/2022 as above.  Reported SOB since COVID infection in 2020 that has not gotten worse as well as rattling sounds in his lungs cancer diagnosis.  Also noted clear phlegm production for the last few months.  Denied any COPD history.  No med changes.  Continued on ASA 81 mg daily, losartan  50 mg daily, simvastatin  10 mg daily.  Follow-up echo 03/18/2024 and CCTA 03/22/2024 as above. Schedule follow-up visit to set him up for TEE to rule out right atrial thrombus.   Today, reports unchanged stable SOB that has been present since COVID infection in 2020 and lung cancer. Denies chest pain, palpitations, syncope, presyncope, dizziness, orthopnea, PND, swelling or significant weight changes, acute bleeding,  or claudication. Reports compliance with medications. Limited activity due to knee arthritis, however he is able to walk up 6-7 steps at home daily multiple times. Quit smoking cigarettes in 2001 after 40+ smoking history. Denies alcohol/drug use. Denies any recent hospitalizations or visits to the emergency department.  Reportedly he was previously tested for sleep apnea years ago and denies diagnosis of sleep apnea.  ROS: 10 point review of system has been reviewed and considered negative except ones been listed in the HPI.   Studies Reviewed: SABRA   ECHO IMPRESSIONS 03/18/2024  1. Technically difficult study.   2. Left ventricular ejection fraction, by estimation, is 55 to 60%. Left  ventricular ejection fraction by 3D volume is 56 %. The left ventricle has  normal function. The left ventricle has no regional wall motion  abnormalities. Left ventricular diastolic   parameters were normal. The average left ventricular global longitudinal  strain is -10.6 %. The global longitudinal strain is abnormal.   3. Right ventricular systolic function is normal. The right ventricular  size is normal.   4. The mitral valve is normal in structure. No evidence of mitral valve  regurgitation. No evidence of mitral stenosis.   5. The aortic valve is tricuspid. Aortic valve regurgitation is not  visualized. No aortic stenosis is present.   6. The inferior vena cava is normal in size with greater than 50%  respiratory variability, suggesting right atrial pressure of 3 mmHg.   Comparison(s): No prior Echocardiogram. 12/07/2023 CT chest Hypodensity in the right atrial appendage may reflect mixing  artifact or thrombus.   CCTA IMPRESSION 03/22/2024 1. Coronary calcium  score of 168. This was 57th percentile for age and sex matched control. 2. Normal coronary origins with left dominance. 3. CAD-RADS 2 Mild non-obstructive CAD. 4. Main pulmonary artery is dilated (40 mm), suggestive of increased pulmonary  pressures. Evaluate for pulmonary hypertension. 5. Ascending aortic dilation, 40.84mm at the level of the PA bifurcation measured double oblique. Consider secondary imaging modality (echocardiogram, CTA Aorta Protocol, MRA Aorta Protocol) in 12 months. 6. Catheter (Port-A-Cath per EMR) tip near the superior cavoatrial junction. 7. No obvious thrombus noted within the right atrium but accuracy is limited due to timing of the contrast bolus and mixing artifact being present. Recommend transesophageal echocardiogram or cardiac MRI clinical correlation required.   RECOMMENDATION: Consider non-atherosclerotic causes of chest pain. Consider preventive therapy and risk factor modification. Risk Assessment/Calculations:   STOP-Bang Score:  4      Physical Exam:   VS:  BP 122/66 (BP Location: Right Arm, Cuff Size: Large)   Pulse 72   Ht 5' 9 (1.753 m)   Wt 271 lb (122.9 kg)   SpO2 94%   BMI 40.02 kg/m    Wt Readings from Last 3 Encounters:  04/14/24 271 lb (122.9 kg)  03/03/24 268 lb (121.6 kg)  12/15/23 275 lb (124.7 kg)    GEN: Well nourished, well developed in no acute distress while sitting in chair.  NECK: No JVD; No carotid bruits CARDIAC: RRR, no murmurs, rubs, gallops RESPIRATORY:  Clear to auscultation without rales, wheezing or rhonchi  ABDOMEN: Soft, non-tender, non-distended EXTREMITIES:  No edema; No deformity   ASSESSMENT AND PLAN: .    Right atrial thrombus CT chest with contrast 11/2022: hypodensity in the right atrial appendage, thrombus versus mixing artifact.  CCTA 03/22/2024: No obvious thrombus within RA but limited accuracy, therefore recommended TEE or cardiac MRI.   Order TEE to r/o. Order CBC & BMET.  Pulmonary hypertension, unspecified (HCC) CT chest with contrast 11/2022: enlarged pulmonary artery, indicative of pulmonary hypertension.  Echo 02/2024: normal EF with normal RV/LV function and size.  CCTA 03/22/2024: Main pulmonary artery is dilated (40  mm), suggestive of increased pulmonary pressures.  Reportedly he was previously tested for sleep apnea years ago and denies diagnosis of sleep apnea. Currently, patient is not interested in repeating sleep study.  Discussed V/Q scan to rule out PE, however patient is not interested in multiple different studies at this time due to other pending procedures. Will reconsider in follow up.   Coronary artery disease involving native coronary artery of native heart without angina pectoris Hyperlipidemia LDL goal <70 CCTA 03/22/2024: CAS is 168, 57th percentile for age and sex matched control. Mild nonobstructive CAD is present. Denies angina symptoms. No need for further ischemic evaluation at this time.  Order FLP.  Continue ASA 81 mg, Zocor  10 mg daily.   Primary hypertension BP this OV well controlled today: 122/66 Continue on Losartan  50 mg daily.  Encourage physical activity for 150 minutes per week and heart healthy low sodium diet. Discussed limiting sodium intake to < 2 grams daily.    Dispo: Follow up in 2-3 months.   Signed, Lorette CINDERELLA Kapur, PA-C   Informed Consent   Shared Decision Making/Informed Consent   The risks [esophageal damage, perforation (1:10,000 risk), bleeding, pharyngeal hematoma as well as other potential complications associated with conscious sedation including aspiration, arrhythmia, respiratory failure and death], benefits (treatment guidance and diagnostic support) and alternatives of a transesophageal echocardiogram were discussed in detail with Billy Hall and he is willing  to proceed.

## 2024-04-14 NOTE — Patient Instructions (Addendum)
 Medication Instructions:   Your physician recommends that you continue on your current medications as directed. Please refer to the Current Medication list given to you today.   Labwork: Fasting Lipids, CBC, BMET at [facility]  Testing/Procedures: Your physician has requested that you have a TEE. During a TEE, sound waves are used to create images of your heart. It provides your doctor with information about the size and shape of your heart and how well your hearts chambers and valves are working. In this test, a transducer is attached to the end of a flexible tube thats guided down your throat and into your esophagus (the tube leading from you mouth to your stomach) to get a more detailed image of your heart. You are not awake for the procedure. Please see the instruction sheet given to you today. For further information please visit [email or web address]    Follow-Up: 2-3 months S.Sheron, PA-C  Any Other Special Instructions Will Be Listed Below (If Applicable).  If you need a refill on your cardiac medications before your next appointment, please call your pharmacy.  TEE scheduled for 04/29/24 ,arrive at 0750 main entrance [facility]  Pre-Op call on 05/28/23, will call Maurilio Hamilton [phone] ,she has written instructions for procedure

## 2024-04-14 NOTE — Progress Notes (Signed)
 Cardiology Office Note:  .   Date:  04/14/2024  ID:  Billy Hall, DOB 08-22-1945, MRN [id number] PCP: Billy Rush, MD   [facility] Providers Cardiologist:  Diannah SHAUNNA Maywood, MD {  History of Present Illness: .   Billy Hall is a 78 y.o. male  with PMHx of lung cancer (s/p chemotherapy and radiation; Follows with oncology at [facility]), CAD, HLD, HTN who reports to [facility] office for follow up.   Pertinent cardiac medical history:  CT chest with contrast 11/2022: hypodensity in the right atrial appendage, thrombus versus mixing artifact. This also showed enlarged pulmonary artery, indicative of pulmonary hypertension.  Echo 03/18/2024: EF 55 to 60%, normal LV/RV function, abnormal global longitudinal strain at -10.6% CCTA 03/22/2024: CAS is 168, 57th percentile for age and sex matched control. Mild nonobstructive CAD is present. Main pulmonary artery is dilated (40 mm), suggestive of increased pulmonary pressures. Ascending aortic dilation, 40.54mm at the level of the PA bifurcation. No obvious thrombus within RA but limited accuracy, therefore recommended TEE or cardiac MRI.    First seen in heart care on 03/03/2024 by Dr. Mallipeddi for evaluation of abnormal CT finding 11/2022 as above.  Reported SOB since COVID infection in 2020 that has not gotten worse as well as rattling sounds in his lungs cancer diagnosis.  Also noted clear phlegm production for the last few months.  Denied any COPD history.  No med changes.  Continued on ASA 81 mg daily, losartan  50 mg daily, simvastatin  10 mg daily.  Follow-up echo 03/18/2024 and CCTA 03/22/2024 as above. Schedule follow-up visit to set him up for TEE to rule out right atrial thrombus.   Today, reports unchanged stable SOB that has been present since COVID infection in 2020 and lung cancer. Denies chest pain, palpitations, syncope, presyncope, dizziness, orthopnea, PND, swelling or significant weight changes, acute bleeding,  or claudication. Reports compliance with medications. Limited activity due to knee arthritis, however he is able to walk up 6-7 steps at home daily multiple times. Quit smoking cigarettes in 2001 after 40+ smoking history. Denies alcohol/drug use. Denies any recent hospitalizations or visits to the emergency department.  Reportedly he was previously tested for sleep apnea years ago and denies diagnosis of sleep apnea.  ROS: 10 point review of system has been reviewed and considered negative except ones been listed in the HPI.   Studies Reviewed: SABRA   ECHO IMPRESSIONS 03/18/2024  1. Technically difficult study.   2. Left ventricular ejection fraction, by estimation, is 55 to 60%. Left  ventricular ejection fraction by 3D volume is 56 %. The left ventricle has  normal function. The left ventricle has no regional wall motion  abnormalities. Left ventricular diastolic   parameters were normal. The average left ventricular global longitudinal  strain is -10.6 %. The global longitudinal strain is abnormal.   3. Right ventricular systolic function is normal. The right ventricular  size is normal.   4. The mitral valve is normal in structure. No evidence of mitral valve  regurgitation. No evidence of mitral stenosis.   5. The aortic valve is tricuspid. Aortic valve regurgitation is not  visualized. No aortic stenosis is present.   6. The inferior vena cava is normal in size with greater than 50%  respiratory variability, suggesting right atrial pressure of 3 mmHg.   Comparison(s): No prior Echocardiogram. 12/07/2023 CT chest Hypodensity in the right atrial appendage may reflect mixing  artifact or thrombus.   CCTA IMPRESSION 03/22/2024 1. Coronary calcium  score of 168. This was 57th percentile for age and sex matched control. 2. Normal coronary origins with left dominance. 3. CAD-RADS 2 Mild non-obstructive CAD. 4. Main pulmonary artery is dilated (40 mm), suggestive of increased pulmonary  pressures. Evaluate for pulmonary hypertension. 5. Ascending aortic dilation, 40.84mm at the level of the PA bifurcation measured double oblique. Consider secondary imaging modality (echocardiogram, CTA Aorta Protocol, MRA Aorta Protocol) in 12 months. 6. Catheter (Port-A-Cath per EMR) tip near the superior cavoatrial junction. 7. No obvious thrombus noted within the right atrium but accuracy is limited due to timing of the contrast bolus and mixing artifact being present. Recommend transesophageal echocardiogram or cardiac MRI clinical correlation required.   RECOMMENDATION: Consider non-atherosclerotic causes of chest pain. Consider preventive therapy and risk factor modification. Risk Assessment/Calculations:   STOP-Bang Score:  4      Physical Exam:   VS:  BP 122/66 (BP Location: Right Arm, Cuff Size: Large)   Pulse 72   Ht 5' 9 (1.753 m)   Wt 271 lb (122.9 kg)   SpO2 94%   BMI 40.02 kg/m    Wt Readings from Last 3 Encounters:  04/14/24 271 lb (122.9 kg)  03/03/24 268 lb (121.6 kg)  12/15/23 275 lb (124.7 kg)    GEN: Well nourished, well developed in no acute distress while sitting in chair.  NECK: No JVD; No carotid bruits CARDIAC: RRR, no murmurs, rubs, gallops RESPIRATORY:  Clear to auscultation without rales, wheezing or rhonchi  ABDOMEN: Soft, non-tender, non-distended EXTREMITIES:  No edema; No deformity   ASSESSMENT AND PLAN: .    Right atrial thrombus CT chest with contrast 11/2022: hypodensity in the right atrial appendage, thrombus versus mixing artifact.  CCTA 03/22/2024: No obvious thrombus within RA but limited accuracy, therefore recommended TEE or cardiac MRI.   Order TEE to r/o. Order CBC & BMET.  Pulmonary hypertension, unspecified (HCC) CT chest with contrast 11/2022: enlarged pulmonary artery, indicative of pulmonary hypertension.  Echo 02/2024: normal EF with normal RV/LV function and size.  CCTA 03/22/2024: Main pulmonary artery is dilated (40  mm), suggestive of increased pulmonary pressures.  Reportedly he was previously tested for sleep apnea years ago and denies diagnosis of sleep apnea. Currently, patient is not interested in repeating sleep study.  Discussed V/Q scan to rule out PE, however patient is not interested in multiple different studies at this time due to other pending procedures. Will reconsider in follow up.   Coronary artery disease involving native coronary artery of native heart without angina pectoris Hyperlipidemia LDL goal <70 CCTA 03/22/2024: CAS is 168, 57th percentile for age and sex matched control. Mild nonobstructive CAD is present. Denies angina symptoms. No need for further ischemic evaluation at this time.  Order FLP.  Continue ASA 81 mg, Zocor  10 mg daily.   Primary hypertension BP this OV well controlled today: 122/66 Continue on Losartan  50 mg daily.  Encourage physical activity for 150 minutes per week and heart healthy low sodium diet. Discussed limiting sodium intake to < 2 grams daily.    Dispo: Follow up in 2-3 months.   Signed, Lorette CINDERELLA Kapur, PA-C   Informed Consent   Shared Decision Making/Informed Consent   The risks [esophageal damage, perforation (1:10,000 risk), bleeding, pharyngeal hematoma as well as other potential complications associated with conscious sedation including aspiration, arrhythmia, respiratory failure and death], benefits (treatment guidance and diagnostic support) and alternatives of a transesophageal echocardiogram were discussed in detail with Mr. Azzarello and he is willing  to proceed.

## 2024-04-15 ENCOUNTER — Inpatient Hospital Stay: Admission: RE | Admit: 2024-04-15

## 2024-04-15 DIAGNOSIS — N2889 Other specified disorders of kidney and ureter: Secondary | ICD-10-CM

## 2024-04-15 HISTORY — PX: IR RADIOLOGIST EVAL & MGMT: IMG5224

## 2024-04-15 NOTE — Consult Note (Signed)
 "      Chief Complaint: Patient was seen in consultation today for a left renal mass at the request of McKenzie,Patrick L  Referring Physician(s): McKenzie,Patrick L  History of Present Illness: Billy Hall is a 78 y.o. male with detection of an enhancing, partially solid and partially cystic left lower pole renal mass   Past Medical History:  Diagnosis Date   Arthritis    Depressive disorder    Diabetes mellitus without complication (HCC)    Fatigue    Headache(784.0)    Hyperlipidemia    Hypertension    Hypothyroidism    Snoring     Past Surgical History:  Procedure Laterality Date   COLONOSCOPY N/A 03/08/2013   Procedure: COLONOSCOPY;  Surgeon: Oneil DELENA Budge, MD;  Location: [facility];  Service: Gastroenterology;  Laterality: N/A;   IR IMAGING GUIDED PORT INSERTION  03/08/2020   IR US  GUIDE BX ASP/DRAIN  03/08/2020   KNEE ARTHROSCOPY Right    2006 By Dr. Brenna at [facility].    THYROID  SURGERY     VIDEO BRONCHOSCOPY WITH ENDOBRONCHIAL ULTRASOUND N/A 01/30/2020   Procedure: VIDEO BRONCHOSCOPY WITH ENDOBRONCHIAL ULTRASOUND;  Surgeon: Kerrin Elspeth BROCKS, MD;  Location: [facility] OR;  Service: Thoracic;  Laterality: N/A;    Allergies: Ace inhibitors  Medications: Prior to Admission medications  Medication Sig Start Date End Date Taking? Authorizing Provider  acetaminophen  (TYLENOL ) 650 MG CR tablet Take 650 mg by mouth every 8 (eight) hours as needed for pain.    [provider]  albuterol  (VENTOLIN  HFA) 108 (90 Base) MCG/ACT inhaler Inhale 2 puffs into the lungs every 6 (six) hours as needed for wheezing or shortness of breath. 01/15/22   Maree, Pratik D, DO  alfuzosin  (UROXATRAL ) 10 MG 24 hr tablet Take 1 tablet (10 mg total) by mouth 2 (two) times daily. 01/13/24   McKenzie, Belvie CROME, MD  aspirin  EC 81 MG tablet Take 81 mg by mouth daily.    [provider]  cholecalciferol  (VITAMIN D3) 25 MCG (1000 UNIT) tablet Take 1,000 Units by mouth daily.     [provider]  levothyroxine  (SYNTHROID ) 100 MCG tablet Take 100 mcg by mouth daily. 05/03/21   [provider]  losartan  (COZAAR ) 50 MG tablet Take 50 mg by mouth daily.     [provider]  magnesium oxide (MAG-OX) 400 (240 Mg) MG tablet Take 400 mg by mouth daily.    [provider]  metFORMIN (GLUCOPHAGE) 500 MG tablet Take 500 mg by mouth 2 (two) times daily with a meal.     [provider]  naproxen  (NAPROSYN ) 500 MG tablet Take 1 tablet (500 mg total) by mouth 2 (two) times daily with a meal. 07/13/22   Cleotilde Rogue, MD  [facility] ULTRA test strip 1 each 2 (two) times daily. 03/03/20   [provider]  sildenafil (REVATIO) 20 MG tablet Take 20 mg by mouth daily as needed (ED).     [provider]  simvastatin  (ZOCOR ) 10 MG tablet Take 10 mg by mouth every evening.     [provider]     Family History  Problem Relation Age of Onset   Alzheimer's disease Mother     Social History   Socioeconomic History   Marital status: Widowed    Spouse name: Not on file   Number of children: 1   Years of education: High Schol   Highest education level: Not on file  Occupational History   Occupation:  Retired  Tobacco Use   Smoking status: Former   Smokeless tobacco: Never   Tobacco comments:    Quit 2004  Vaping Use   Vaping status: Never Used  Substance and Sexual Activity   Alcohol use: Not Currently    Alcohol/week: 0.0 standard drinks of alcohol    Comment: has quit since 2001   Drug use: No   Sexual activity: Not on file  Other Topics Concern   Not on file  Social History Narrative   I cup of coffee a day, drinks some Green tea   Social Drivers of Health   Tobacco Use: Medium Risk (04/14/2024)   Patient History    Smoking Tobacco Use: Former    Smokeless Tobacco Use: Never    Passive Exposure: Not on Actuary Strain: Low Risk (12/05/2022)   Overall Financial Resource Strain (CARDIA)     Difficulty of Paying Living Expenses: Not very hard  Food Insecurity: No Food Insecurity (01/14/2022)   Hunger Vital Sign    Worried About Running Out of Food in the Last Year: Never true    Ran Out of Food in the Last Year: Never true  Transportation Needs: No Transportation Needs (12/05/2022)   PRAPARE - Administrator, Civil Service (Medical): No    Lack of Transportation (Non-Medical): No  Physical Activity: Inactive (12/05/2022)   Exercise Vital Sign    Days of Exercise per Week: 0 days    Minutes of Exercise per Session: 0 min  Stress: Not on file  Social Connections: Moderately Integrated (12/05/2022)   Social Connection and Isolation Panel    Frequency of Communication with Friends and Family: More than three times a week    Frequency of Social Gatherings with Friends and Family: More than three times a week    Attends Religious Services: 1 to 4 times per year    Active Member of [employer] or Organizations: No    Attends Banker Meetings: 1 to 4 times per year    Marital Status: Widowed  Depression (PHQ2-9): Not on file  Alcohol Screen: Not on file  Housing: Unknown (05/29/2023)   Received from [facility]   Epic    Unable to Pay for Housing in the Last Year: Not on file    Number of Times Moved in the Last Year: Not on file    At any time in the past 12 months, were you homeless or living in a shelter (including now)?: No  Utilities: Not At Risk (12/05/2022)   [facility] Utilities    Threatened with loss of utilities: No  Health Literacy: Not on file    ECOG Status: 0 - Asymptomatic  Review of Systems: A 12 point ROS discussed and pertinent positives are indicated in the HPI above.  All other systems are negative.  Review of Systems  Vital Signs: BP 132/74 (BP Location: Left Arm, Patient Position: Sitting, Cuff Size: Normal)   Pulse 77   Temp 97.9 F (36.6 C) (Oral)   Resp (!) 24   SpO2 91%    Physical Exam   Imaging: CT CORONARY  MORPH W/CTA COR W/SCORE W/CA W/CM &/OR WO/CM Addendum Date: 03/26/2024 ADDENDUM REPORT: 03/26/2024 19:49 EXAM: OVER-READ INTERPRETATION  CT CHEST The following report is an over-read performed by radiologist Dr. Suzen Dials of [facility] on 03/26/2024. This over-read does not include interpretation of cardiac or coronary anatomy or pathology. The coronary calcium score/coronary CTA interpretation by the cardiologist  is attached. COMPARISON:  December 07, 2023 FINDINGS: Cardiovascular: There are no significant extracardiac vascular findings. Mediastinum/Nodes: There are no enlarged lymph nodes within the visualized mediastinum. Lungs/Pleura: There is no pleural effusion. There is mild to moderate severity lingular, right middle lobe and right lower lobe linear scarring and/or atelectasis. Right-sided volume loss is also seen. Upper abdomen: A 6 mm parenchymal calcification is seen within the posterior aspect of the liver dome. Musculoskeletal/Chest wall: No chest wall mass or suspicious osseous findings within the visualized chest. IMPRESSION: Mild to moderate severity lingular, right middle lobe and right lower lobe linear scarring and/or atelectasis. Electronically Signed   By: Suzen Dials M.D.   On: 03/26/2024 19:49   Result Date: 03/26/2024 HISTORY: Right Atrial Thrombus EXAM: Cardiac/Coronary  CT PROTOCOL: A non-contrast, gated CT scan was obtained with axial slices of 2.5 mm through the heart for calcium scoring. Calcium scoring was performed using the Agatston method. A 120 kV prospective, gated, contrast cardiac CT scan was obtained. Gantry rotation speed was 230 msec and collimation was 0.63 mm. Two sublingual nitroglycerin  tablets (0.8 mg) were given. The 3D data set was reconstructed with motion correction for the best systolic or diastolic phase. Images were analyzed on a dedicated workstation using MPR, MIP, and VRT modes. The patient received 95 cc of contrast. FINDINGS:  Image quality: Average. Artifact: Limited. Coronary calcium score is 168, which places the patient in the 57th percentile for age and sex matched control. Coronary arteries: Normal coronary origins.  Left dominance. Left Main Coronary Artery: Normal caliber vessel, originates from the left coronary cusp, trifurcates into a left anterior descending artery (LAD), left circumflex artery (LCX), and ramus intermedius (RI). There is no plaque or stenosis. Left Anterior Descending Artery: Normal caliber vessel, wraps the apex, gives off 3 diagonal branches. Minimal calcified plaque (<24%) at mid LAD. First Diagonal branch: Small caliber, not well visualized Second Diagonal branch: Patent Third Diagonal branch: Patent Ramus intermedius: Normal caliber, large in size, bifurcates into superior and inferior branches. Minimal calcified plaque (<24%) proximal segment, otherwise patent. Left Circumflex Artery: Normal caliber vessel, dominant, gives off 1 obtuse marginal branch. LCX is patent. First Obtuse Marginal branch: Mild stenosis (25-49%) proximal and mid segment due to calcified plaque Right Coronary Artery: Minimal luminal irregularities (<24%) due to mixed plaque, overall RCA is patent. Aorta: Ascending aortic dilation, 40.27mm at the level of the PA bifurcation measured double oblique. Aortic Valve: Native valve, trileaflet aortic valve, no significant calcification. Mitral valve: Native valve, no significant mitral annular calcification. Other findings: Normal pulmonary vein drainage into the left atrium. Normal left atrial appendage without thrombus. Main pulmonary artery is dilated (40 mm), suggestive of increased pulmonary pressures. Clinical correlation required. Catheter (Port-A-Cath per EMR) tip near the superior cavoatrial junction. No obvious thrombus noted within the right atrium but accuracy is limited due to timing of the contrast bolus and mixing artifact being present. Please see separate report from  [facility] for non-cardiac findings. IMPRESSION: 1. Coronary calcium score of 168. This was 57th percentile for age and sex matched control. 2. Normal coronary origins with left dominance. 3. CAD-RADS 2 Mild non-obstructive CAD. 4. Main pulmonary artery is dilated (40 mm), suggestive of increased pulmonary pressures. Evaluate for pulmonary hypertension. 5. Ascending aortic dilation, 40.37mm at the level of the PA bifurcation measured double oblique. Consider secondary imaging modality (echocardiogram, CTA Aorta Protocol, MRA Aorta Protocol) in 12 months. 6. Catheter (Port-A-Cath per EMR) tip near the superior cavoatrial junction. 7. No obvious  thrombus noted within the right atrium but accuracy is limited due to timing of the contrast bolus and mixing artifact being present. Recommend transesophageal echocardiogram or cardiac MRI clinical correlation required. RECOMMENDATION: Consider non-atherosclerotic causes of chest pain. Consider preventive therapy and risk factor modification. Electronically Signed: By: Madonna Large On: 03/23/2024 13:42   ECHOCARDIOGRAM COMPLETE Result Date: 03/18/2024    ECHOCARDIOGRAM REPORT   Patient Name:   REGINA COPPOLINO Date of Exam: 03/18/2024 Medical Rec #:  [id number]           Height:       69.0 in Accession #:    [id number]          Weight:       268.0 lb Date of Birth:  June 20, 1945           BSA:          2.340 m Patient Age:    78 years            BP:           113/73 mmHg Patient Gender: M                   HR:           74 bpm. Exam Location:  Outpatient Procedure: 2D Echo, 3D Echo, Cardiac Doppler, Color Doppler and Strain Analysis            (Both Spectral and Color Flow Doppler were utilized during            procedure). Indications:    Abnormal CT  History:        Patient has no prior history of Echocardiogram examinations.                 Risk Factors:Hypertension, Diabetes, Former Smoker and                 Dyslipidemia. PHTN; History of lung cancer.   Sonographer:    Orvil Holmes RDCS Referring Phys: [id number] VISHNU P MALLIPEDDI  Sonographer Comments: Technically challenging study due to limited acoustic windows and patient is obese. IMPRESSIONS  1. Technically difficult study.  2. Left ventricular ejection fraction, by estimation, is 55 to 60%. Left ventricular ejection fraction by 3D volume is 56 %. The left ventricle has normal function. The left ventricle has no regional wall motion abnormalities. Left ventricular diastolic  parameters were normal. The average left ventricular global longitudinal strain is -10.6 %. The global longitudinal strain is abnormal.  3. Right ventricular systolic function is normal. The right ventricular size is normal.  4. The mitral valve is normal in structure. No evidence of mitral valve regurgitation. No evidence of mitral stenosis.  5. The aortic valve is tricuspid. Aortic valve regurgitation is not visualized. No aortic stenosis is present.  6. The inferior vena cava is normal in size with greater than 50% respiratory variability, suggesting right atrial pressure of 3 mmHg. Comparison(s): No prior Echocardiogram. 12/07/2023 CT chest Hypodensity in the right atrial appendage may reflect mixing artifact or thrombus.  Conclusion(s)/Recommendation(s): Consider TEE/cMRI to further evaluate the right atrial appendage. Right atrial appendage was not well seen on current study. FINDINGS  Left Ventricle: Left ventricular ejection fraction, by estimation, is 55 to 60%. Left ventricular ejection fraction by 3D volume is 56 %. The left ventricle has normal function. The left ventricle has no regional wall motion abnormalities. The average left ventricular global longitudinal strain is -10.6 %. Strain was performed and the global  longitudinal strain is abnormal. The left ventricular internal cavity size was normal in size. There is no left ventricular hypertrophy. Left ventricular diastolic parameters were normal. Right Ventricle: The  right ventricular size is normal. Right vetricular wall thickness was not well visualized. Right ventricular systolic function is normal. Left Atrium: Left atrial size was normal in size. Right Atrium: Right atrial size was normal in size. Pericardium: There is no evidence of pericardial effusion. Mitral Valve: The mitral valve is normal in structure. No evidence of mitral valve regurgitation. No evidence of mitral valve stenosis. Tricuspid Valve: The tricuspid valve is grossly normal. Tricuspid valve regurgitation is not demonstrated. No evidence of tricuspid stenosis. Aortic Valve: The aortic valve is tricuspid. Aortic valve regurgitation is not visualized. No aortic stenosis is present. Pulmonic Valve: The pulmonic valve was not well visualized. Pulmonic valve regurgitation is not visualized. Aorta: The aortic root and ascending aorta are structurally normal, with no evidence of dilitation. Venous: The inferior vena cava is normal in size with greater than 50% respiratory variability, suggesting right atrial pressure of 3 mmHg. IAS/Shunts: The interatrial septum was not well visualized. Additional Comments: 3D was performed not requiring image post processing on an independent workstation and was normal.  LEFT VENTRICLE PLAX 2D LVIDd:         4.44 cm         Diastology LVIDs:         2.92 cm         LV e' medial:    7.94 cm/s LV PW:         1.08 cm         LV E/e' medial:  11.7 LV IVS:        1.01 cm         LV e' lateral:   7.51 cm/s LVOT diam:     2.17 cm         LV E/e' lateral: 12.4 LV SV:         69 LV SV Index:   30              2D Longitudinal LVOT Area:     3.70 cm        Strain                                2D Strain GLS   -8.8 %                                (A4C):                                2D Strain GLS   -10.8 %                                (A3C):                                2D Strain GLS   -13.3 %                                (A2C):  2D Strain GLS   -10.6  %                                Avg:                                 3D Volume EF                                LV 3D EF:    Left                                             ventricul                                             ar                                             ejection                                             fraction                                             by 3D                                             volume is                                             56 %.                                 3D Volume EF:                                3D EF:        56 %                                LV EDV:       164 ml                                LV ESV:       72 ml  LV SV:        93 ml RIGHT VENTRICLE RV Basal diam:  3.38 cm     PULMONARY VEINS RV Mid diam:    3.19 cm     A Reversal Velocity: 37.40 cm/s RV S prime:     14.80 cm/s  Diastolic Velocity:  58.30 cm/s TAPSE (M-mode): 2.0 cm      S/D Velocity:        1.00                             Systolic Velocity:   58.70 cm/s LEFT ATRIUM             Index        RIGHT ATRIUM           Index LA diam:        3.22 cm 1.38 cm/m   RA Area:     14.50 cm LA Vol (A2C):   30.2 ml 12.91 ml/m  RA Volume:   33.60 ml  14.36 ml/m LA Vol (A4C):   43.6 ml 18.64 ml/m LA Biplane Vol: 38.2 ml 16.33 ml/m  AORTIC VALVE LVOT Vmax:   88.60 cm/s LVOT Vmean:  55.500 cm/s LVOT VTI:    0.187 m  AORTA Ao Root diam: 3.39 cm Ao Asc diam:  3.83 cm MITRAL VALVE MV Area (PHT): 2.83 cm    SHUNTS MV Decel Time: 268 msec    Systemic VTI:  0.19 m MV E velocity: 92.80 cm/s  Systemic Diam: 2.17 cm MV A velocity: 90.10 cm/s MV E/A ratio:  1.03 Sunit Tolia Electronically signed by Madonna Large Signature Date/Time: 03/18/2024/10:05:14 PM    Final     Labs:  CBC: Recent Labs    06/10/23 0913 09/25/23 1052 12/07/23 1119  WBC 4.7 4.3 5.1  HGB 12.9* 12.7* 12.5*  HCT 40.2 38.6* 38.8*  PLT 176 181 180    COAGS: No results for input(s): INR,  APTT in the last 8760 hours.  BMP: Recent Labs    06/10/23 0913 09/25/23 1052 12/07/23 1119 03/14/24 1412  NA 139 137 138 140  K 4.3 4.0 4.3 4.6  CL 105 100 104 102  CO2 30 26 30 24   GLUCOSE 123* 103* 107* 208*  BUN 16 16 16 13   CALCIUM 9.0 8.4* 8.8* 9.0  CREATININE 0.78 0.77 0.76 0.94  GFRNONAA >60 >60 >60  --     LIVER FUNCTION TESTS: Recent Labs    06/10/23 0913 09/25/23 1052 12/07/23 1119  BILITOT 0.6 0.6 0.7  AST 13* 14* 11*  ALT 13 12 8   ALKPHOS 63 60 55  PROT 6.7 6.7 6.7  ALBUMIN 3.8 3.3* 3.9    Assessment and Plan:  ***  Thank you for this interesting consult.  I greatly enjoyed meeting Billy Hall and look forward to participating in their care.  A copy of this report was sent to the requesting provider on this date.  Electronically Signed: Marcey ONEIDA Moan 04/15/2024, 4:49 PM    I spent a total of 40 Minutes in face to face in clinical consultation, greater than 50% of which was counseling/coordinating care for left renal mass.   "

## 2024-04-18 ENCOUNTER — Ambulatory Visit: Payer: Self-pay | Admitting: Physician Assistant

## 2024-04-18 ENCOUNTER — Other Ambulatory Visit (HOSPITAL_COMMUNITY)
Admission: RE | Admit: 2024-04-18 | Discharge: 2024-04-18 | Disposition: A | Discharge status: Home / Self Care | Source: Ambulatory Visit | Attending: Physician Assistant | Admitting: Physician Assistant

## 2024-04-18 DIAGNOSIS — I513 Intracardiac thrombosis, not elsewhere classified: Secondary | ICD-10-CM | POA: Insufficient documentation

## 2024-04-18 DIAGNOSIS — I10 Essential (primary) hypertension: Secondary | ICD-10-CM | POA: Diagnosis present

## 2024-04-18 DIAGNOSIS — E785 Hyperlipidemia, unspecified: Secondary | ICD-10-CM | POA: Diagnosis present

## 2024-04-18 DIAGNOSIS — I2510 Atherosclerotic heart disease of native coronary artery without angina pectoris: Secondary | ICD-10-CM | POA: Diagnosis present

## 2024-04-18 DIAGNOSIS — I272 Pulmonary hypertension, unspecified: Secondary | ICD-10-CM | POA: Diagnosis present

## 2024-04-18 LAB — CBC
HCT: 40.6 % (ref 39.0–52.0)
Hemoglobin: 13.1 g/dL (ref 13.0–17.0)
MCH: 26.8 pg (ref 26.0–34.0)
MCHC: 32.3 g/dL (ref 30.0–36.0)
MCV: 83 fL (ref 80.0–100.0)
Platelets: 215 K/uL (ref 150–400)
RBC: 4.89 MIL/uL (ref 4.22–5.81)
RDW: 15.8 % — ABNORMAL HIGH (ref 11.5–15.5)
WBC: 4.7 K/uL (ref 4.0–10.5)
nRBC: 0 % (ref 0.0–0.2)

## 2024-04-18 LAB — BASIC METABOLIC PANEL WITH GFR
Anion gap: 4 — ABNORMAL LOW (ref 5–15)
BUN: 13 mg/dL (ref 8–23)
CO2: 33 mmol/L — ABNORMAL HIGH (ref 22–32)
Calcium: 8.8 mg/dL — ABNORMAL LOW (ref 8.9–10.3)
Chloride: 104 mmol/L (ref 98–111)
Creatinine, Ser: 0.86 mg/dL (ref 0.61–1.24)
GFR, Estimated: >60 mL/min
Glucose, Bld: 125 mg/dL — ABNORMAL HIGH (ref 70–99)
Potassium: 4.5 mmol/L (ref 3.5–5.1)
Sodium: 142 mmol/L (ref 135–145)

## 2024-04-18 LAB — LIPID PANEL
Cholesterol: 128 mg/dL (ref 0–200)
HDL: 58 mg/dL
LDL Cholesterol: 61 mg/dL (ref 0–99)
Total CHOL/HDL Ratio: 2.2 ratio
Triglycerides: 45 mg/dL
VLDL: 9 mg/dL (ref 0–40)

## 2024-04-19 ENCOUNTER — Encounter: Payer: Self-pay | Admitting: Urology

## 2024-04-19 NOTE — Patient Instructions (Signed)

## 2024-04-25 ENCOUNTER — Encounter (HOSPITAL_COMMUNITY): Payer: Self-pay

## 2024-04-25 ENCOUNTER — Other Ambulatory Visit: Payer: Self-pay

## 2024-04-26 ENCOUNTER — Telehealth: Payer: Self-pay | Admitting: Internal Medicine

## 2024-04-26 ENCOUNTER — Encounter (HOSPITAL_COMMUNITY)
Admission: RE | Admit: 2024-04-26 | Discharge: 2024-04-26 | Disposition: A | Discharge status: Home / Self Care | Source: Ambulatory Visit | Attending: Internal Medicine | Admitting: Internal Medicine

## 2024-04-26 NOTE — Telephone Encounter (Signed)
 Called pt and they are aware of appt changes

## 2024-04-29 ENCOUNTER — Other Ambulatory Visit (HOSPITAL_COMMUNITY): Payer: Self-pay | Admitting: *Deleted

## 2024-04-29 ENCOUNTER — Encounter (HOSPITAL_COMMUNITY)
Admission: RE | Disposition: A | Discharge status: Other Acute Inpatient Hospital | Payer: Self-pay | Source: Home / Self Care | Attending: Internal Medicine

## 2024-04-29 ENCOUNTER — Ambulatory Visit (HOSPITAL_COMMUNITY)

## 2024-04-29 ENCOUNTER — Ambulatory Visit (HOSPITAL_BASED_OUTPATIENT_CLINIC_OR_DEPARTMENT_OTHER)
Admission: RE | Admit: 2024-04-29 | Discharge: 2024-04-29 | Disposition: A | Discharge status: Other Acute Inpatient Hospital | Source: Home / Self Care | Attending: Internal Medicine | Admitting: Internal Medicine

## 2024-04-29 ENCOUNTER — Inpatient Hospital Stay (HOSPITAL_COMMUNITY)

## 2024-04-29 ENCOUNTER — Other Ambulatory Visit: Payer: Self-pay

## 2024-04-29 ENCOUNTER — Encounter (HOSPITAL_COMMUNITY): Payer: Self-pay | Admitting: Internal Medicine

## 2024-04-29 ENCOUNTER — Encounter (HOSPITAL_COMMUNITY): Payer: Self-pay | Admitting: Emergency Medicine

## 2024-04-29 ENCOUNTER — Inpatient Hospital Stay (HOSPITAL_COMMUNITY)
Admission: EM | Admit: 2024-04-29 | Discharge: 2024-05-02 | DRG: 291 | Disposition: A | Discharge status: Home / Self Care | Source: Ambulatory Visit | Attending: Family Medicine | Admitting: Family Medicine

## 2024-04-29 DIAGNOSIS — Z820 Family history of epilepsy and other diseases of the nervous system: Secondary | ICD-10-CM | POA: Diagnosis not present

## 2024-04-29 DIAGNOSIS — Z95828 Presence of other vascular implants and grafts: Secondary | ICD-10-CM

## 2024-04-29 DIAGNOSIS — Z85118 Personal history of other malignant neoplasm of bronchus and lung: Secondary | ICD-10-CM | POA: Diagnosis not present

## 2024-04-29 DIAGNOSIS — N2889 Other specified disorders of kidney and ureter: Secondary | ICD-10-CM | POA: Diagnosis present

## 2024-04-29 DIAGNOSIS — E785 Hyperlipidemia, unspecified: Secondary | ICD-10-CM | POA: Diagnosis present

## 2024-04-29 DIAGNOSIS — E039 Hypothyroidism, unspecified: Secondary | ICD-10-CM | POA: Diagnosis present

## 2024-04-29 DIAGNOSIS — Z7982 Long term (current) use of aspirin: Secondary | ICD-10-CM

## 2024-04-29 DIAGNOSIS — Z7984 Long term (current) use of oral hypoglycemic drugs: Secondary | ICD-10-CM

## 2024-04-29 DIAGNOSIS — J9601 Acute respiratory failure with hypoxia: Secondary | ICD-10-CM | POA: Diagnosis present

## 2024-04-29 DIAGNOSIS — Z79890 Hormone replacement therapy (postmenopausal): Secondary | ICD-10-CM

## 2024-04-29 DIAGNOSIS — I272 Pulmonary hypertension, unspecified: Secondary | ICD-10-CM

## 2024-04-29 DIAGNOSIS — I5031 Acute diastolic (congestive) heart failure: Secondary | ICD-10-CM | POA: Diagnosis not present

## 2024-04-29 DIAGNOSIS — I509 Heart failure, unspecified: Principal | ICD-10-CM

## 2024-04-29 DIAGNOSIS — I10 Essential (primary) hypertension: Secondary | ICD-10-CM | POA: Diagnosis present

## 2024-04-29 DIAGNOSIS — N401 Enlarged prostate with lower urinary tract symptoms: Secondary | ICD-10-CM | POA: Diagnosis present

## 2024-04-29 DIAGNOSIS — I2510 Atherosclerotic heart disease of native coronary artery without angina pectoris: Secondary | ICD-10-CM | POA: Diagnosis present

## 2024-04-29 DIAGNOSIS — R0602 Shortness of breath: Secondary | ICD-10-CM | POA: Diagnosis present

## 2024-04-29 DIAGNOSIS — I513 Intracardiac thrombosis, not elsewhere classified: Secondary | ICD-10-CM | POA: Diagnosis present

## 2024-04-29 DIAGNOSIS — R0902 Hypoxemia: Secondary | ICD-10-CM | POA: Diagnosis present

## 2024-04-29 DIAGNOSIS — Z8616 Personal history of COVID-19: Secondary | ICD-10-CM | POA: Diagnosis not present

## 2024-04-29 DIAGNOSIS — I5033 Acute on chronic diastolic (congestive) heart failure: Secondary | ICD-10-CM | POA: Diagnosis present

## 2024-04-29 DIAGNOSIS — Z87891 Personal history of nicotine dependence: Secondary | ICD-10-CM

## 2024-04-29 DIAGNOSIS — E119 Type 2 diabetes mellitus without complications: Secondary | ICD-10-CM | POA: Diagnosis present

## 2024-04-29 DIAGNOSIS — Z923 Personal history of irradiation: Secondary | ICD-10-CM | POA: Diagnosis not present

## 2024-04-29 DIAGNOSIS — C3411 Malignant neoplasm of upper lobe, right bronchus or lung: Secondary | ICD-10-CM | POA: Diagnosis present

## 2024-04-29 DIAGNOSIS — Z9221 Personal history of antineoplastic chemotherapy: Secondary | ICD-10-CM

## 2024-04-29 DIAGNOSIS — I110 Hypertensive heart disease with heart failure: Principal | ICD-10-CM | POA: Diagnosis present

## 2024-04-29 DIAGNOSIS — N138 Other obstructive and reflux uropathy: Secondary | ICD-10-CM | POA: Diagnosis present

## 2024-04-29 DIAGNOSIS — Z888 Allergy status to other drugs, medicaments and biological substances status: Secondary | ICD-10-CM | POA: Diagnosis not present

## 2024-04-29 DIAGNOSIS — Z79899 Other long term (current) drug therapy: Secondary | ICD-10-CM | POA: Diagnosis not present

## 2024-04-29 HISTORY — PX: TEE WITHOUT CARDIOVERSION: SHX5443

## 2024-04-29 HISTORY — DX: Malignant neoplasm of unspecified part of unspecified bronchus or lung: C34.90

## 2024-04-29 LAB — CBC WITH DIFFERENTIAL/PLATELET
Abs Immature Granulocytes: 0.02 K/uL (ref 0.00–0.07)
Basophils Absolute: 0 K/uL (ref 0.0–0.1)
Basophils Relative: 1 %
Eosinophils Absolute: 0 K/uL (ref 0.0–0.5)
Eosinophils Relative: 1 %
HCT: 41.1 % (ref 39.0–52.0)
Hemoglobin: 12.8 g/dL — ABNORMAL LOW (ref 13.0–17.0)
Immature Granulocytes: 0 %
Lymphocytes Relative: 10 %
Lymphs Abs: 0.6 K/uL — ABNORMAL LOW (ref 0.7–4.0)
MCH: 26.6 pg (ref 26.0–34.0)
MCHC: 31.1 g/dL (ref 30.0–36.0)
MCV: 85.4 fL (ref 80.0–100.0)
Monocytes Absolute: 0.5 K/uL (ref 0.1–1.0)
Monocytes Relative: 9 %
Neutro Abs: 4.5 K/uL (ref 1.7–7.7)
Neutrophils Relative %: 79 %
Platelets: 188 K/uL (ref 150–400)
RBC: 4.81 MIL/uL (ref 4.22–5.81)
RDW: 16.1 % — ABNORMAL HIGH (ref 11.5–15.5)
WBC: 5.6 K/uL (ref 4.0–10.5)
nRBC: 0 % (ref 0.0–0.2)

## 2024-04-29 LAB — BASIC METABOLIC PANEL WITH GFR
Anion gap: 13 (ref 5–15)
BUN: 16 mg/dL (ref 8–23)
CO2: 26 mmol/L (ref 22–32)
Calcium: 8.9 mg/dL (ref 8.9–10.3)
Chloride: 102 mmol/L (ref 98–111)
Creatinine, Ser: 0.9 mg/dL (ref 0.61–1.24)
GFR, Estimated: >60 mL/min
Glucose, Bld: 144 mg/dL — ABNORMAL HIGH (ref 70–99)
Potassium: 4.1 mmol/L (ref 3.5–5.1)
Sodium: 141 mmol/L (ref 135–145)

## 2024-04-29 LAB — HEMOGLOBIN A1C
Hgb A1c MFr Bld: 6.5 % — ABNORMAL HIGH (ref 4.8–5.6)
Mean Plasma Glucose: 139.85 mg/dL

## 2024-04-29 LAB — PRO BRAIN NATRIURETIC PEPTIDE: Pro Brain Natriuretic Peptide: 96.3 pg/mL

## 2024-04-29 LAB — GLUCOSE, CAPILLARY
Glucose-Capillary: 113 mg/dL — ABNORMAL HIGH (ref 70–99)
Glucose-Capillary: 115 mg/dL — ABNORMAL HIGH (ref 70–99)
Glucose-Capillary: 116 mg/dL — ABNORMAL HIGH (ref 70–99)

## 2024-04-29 MED ORDER — PHENYLEPHRINE 80 MCG/ML (10ML) SYRINGE FOR IV PUSH (FOR BLOOD PRESSURE SUPPORT)
PREFILLED_SYRINGE | INTRAVENOUS | Status: DC | PRN
  Administered 2024-04-29: 160 ug via INTRAVENOUS
  Administered 2024-04-29: 80 ug via INTRAVENOUS
  Administered 2024-04-29 (×2): 160 ug via INTRAVENOUS

## 2024-04-29 MED ORDER — ACETAMINOPHEN 325 MG PO TABS: 650.0000 mg | ORAL_TABLET | ORAL | Status: DC | PRN

## 2024-04-29 MED ORDER — FUROSEMIDE 10 MG/ML IJ SOLN: 80.0000 mg | Freq: Two times a day (BID) | INTRAMUSCULAR | Status: DC

## 2024-04-29 MED ORDER — BUTAMBEN-TETRACAINE-BENZOCAINE 2-2-14 % EX AERO
INHALATION_SPRAY | CUTANEOUS | Status: AC
  Filled 2024-04-29: qty 5

## 2024-04-29 MED ORDER — IPRATROPIUM-ALBUTEROL 0.5-2.5 (3) MG/3ML IN SOLN
RESPIRATORY_TRACT | Status: AC
  Administered 2024-04-29: 3 mL via RESPIRATORY_TRACT
  Filled 2024-04-29: qty 3

## 2024-04-29 MED ORDER — ENOXAPARIN SODIUM 60 MG/0.6ML IJ SOSY
60.0000 mg | PREFILLED_SYRINGE | INTRAMUSCULAR | Status: DC
  Administered 2024-04-29 – 2024-05-01 (×3): 60 mg via SUBCUTANEOUS
  Filled 2024-04-29 (×3): qty 0.6

## 2024-04-29 MED ORDER — PHENOL 1.4 % MT LIQD
1.0000 | OROMUCOSAL | Status: DC | PRN
  Administered 2024-04-29: 1 via OROMUCOSAL
  Filled 2024-04-29: qty 177

## 2024-04-29 MED ORDER — SODIUM CHLORIDE 0.9% FLUSH: 3.0000 mL | INTRAVENOUS | Status: DC | PRN

## 2024-04-29 MED ORDER — LACTATED RINGERS IV SOLN: INTRAVENOUS | Status: DC

## 2024-04-29 MED ORDER — INSULIN ASPART 100 UNIT/ML IJ SOLN
3.0000 [IU] | Freq: Three times a day (TID) | INTRAMUSCULAR | Status: DC
  Administered 2024-04-30 – 2024-05-02 (×7): 3 [IU] via SUBCUTANEOUS
  Filled 2024-04-29 (×7): qty 1

## 2024-04-29 MED ORDER — BUTAMBEN-TETRACAINE-BENZOCAINE 2-2-14 % EX AERO
INHALATION_SPRAY | CUTANEOUS | Status: DC | PRN
  Administered 2024-04-29: 1 via TOPICAL

## 2024-04-29 MED ORDER — POTASSIUM CHLORIDE 20 MEQ PO PACK
40.0000 meq | PACK | Freq: Every day | ORAL | Status: DC
  Administered 2024-04-30 – 2024-05-02 (×3): 40 meq via ORAL
  Filled 2024-04-29 (×3): qty 2

## 2024-04-29 MED ORDER — LIDOCAINE 2% (20 MG/ML) 5 ML SYRINGE
INTRAMUSCULAR | Status: AC
  Filled 2024-04-29: qty 5

## 2024-04-29 MED ORDER — FUROSEMIDE 10 MG/ML IJ SOLN
INTRAMUSCULAR | Status: DC | PRN
  Administered 2024-04-29: 20 mg via INTRAMUSCULAR

## 2024-04-29 MED ORDER — IPRATROPIUM-ALBUTEROL 0.5-2.5 (3) MG/3ML IN SOLN: 3.0000 mL | Freq: Once | RESPIRATORY_TRACT | Status: AC

## 2024-04-29 MED ORDER — INSULIN ASPART 100 UNIT/ML IJ SOLN
0.0000 [IU] | Freq: Three times a day (TID) | INTRAMUSCULAR | Status: DC
  Administered 2024-04-30 – 2024-05-01 (×2): 2 [IU] via SUBCUTANEOUS
  Administered 2024-05-01: 3 [IU] via SUBCUTANEOUS
  Administered 2024-05-01 – 2024-05-02 (×2): 2 [IU] via SUBCUTANEOUS
  Administered 2024-05-02: 3 [IU] via SUBCUTANEOUS
  Filled 2024-04-29 (×6): qty 1

## 2024-04-29 MED ORDER — GLYCOPYRROLATE PF 0.2 MG/ML IJ SOSY
PREFILLED_SYRINGE | INTRAMUSCULAR | Status: AC
  Filled 2024-04-29: qty 1

## 2024-04-29 MED ORDER — EPHEDRINE 5 MG/ML INJ
INTRAVENOUS | Status: AC
  Filled 2024-04-29: qty 5

## 2024-04-29 MED ORDER — PROPOFOL 500 MG/50ML IV EMUL
INTRAVENOUS | Status: AC
  Filled 2024-04-29: qty 50

## 2024-04-29 MED ORDER — FUROSEMIDE NICU IV SYRINGE 10 MG/ML: 20.0000 mg | Freq: Once | INTRAMUSCULAR | Status: DC

## 2024-04-29 MED ORDER — LIDOCAINE 2% (20 MG/ML) 5 ML SYRINGE
INTRAMUSCULAR | Status: DC | PRN
  Administered 2024-04-29: 60 mg via INTRAVENOUS

## 2024-04-29 MED ORDER — EPHEDRINE SULFATE (PRESSORS) 25 MG/5ML IV SOSY
PREFILLED_SYRINGE | INTRAVENOUS | Status: DC | PRN
  Administered 2024-04-29: 5 mg via INTRAVENOUS

## 2024-04-29 MED ORDER — SODIUM CHLORIDE 0.9 % IV SOLN: 250.0000 mL | INTRAVENOUS | Status: AC | PRN

## 2024-04-29 MED ORDER — FUROSEMIDE 10 MG/ML IJ SOLN
INTRAMUSCULAR | Status: AC
  Filled 2024-04-29: qty 4

## 2024-04-29 MED ORDER — FUROSEMIDE 10 MG/ML IJ SOLN
80.0000 mg | Freq: Two times a day (BID) | INTRAMUSCULAR | Status: DC
  Administered 2024-04-29 – 2024-05-02 (×6): 80 mg via INTRAVENOUS
  Filled 2024-04-29 (×6): qty 8

## 2024-04-29 MED ORDER — IPRATROPIUM-ALBUTEROL 0.5-2.5 (3) MG/3ML IN SOLN
3.0000 mL | Freq: Once | RESPIRATORY_TRACT | Status: AC
  Administered 2024-04-29: 3 mL via RESPIRATORY_TRACT

## 2024-04-29 MED ORDER — ONDANSETRON HCL 4 MG/2ML IJ SOLN: 4.0000 mg | Freq: Four times a day (QID) | INTRAMUSCULAR | Status: DC | PRN

## 2024-04-29 MED ORDER — PHENYLEPHRINE 80 MCG/ML (10ML) SYRINGE FOR IV PUSH (FOR BLOOD PRESSURE SUPPORT)
PREFILLED_SYRINGE | INTRAVENOUS | Status: AC
  Filled 2024-04-29: qty 10

## 2024-04-29 MED ORDER — IPRATROPIUM-ALBUTEROL 0.5-2.5 (3) MG/3ML IN SOLN
RESPIRATORY_TRACT | Status: AC
  Filled 2024-04-29: qty 3

## 2024-04-29 MED ORDER — SODIUM CHLORIDE 0.9% FLUSH
3.0000 mL | Freq: Two times a day (BID) | INTRAVENOUS | Status: DC
  Administered 2024-04-29 – 2024-05-02 (×6): 3 mL via INTRAVENOUS

## 2024-04-29 MED ORDER — ENOXAPARIN SODIUM 40 MG/0.4ML IJ SOSY: 40.0000 mg | PREFILLED_SYRINGE | INTRAMUSCULAR | Status: DC

## 2024-04-29 MED ORDER — GLYCOPYRROLATE PF 0.2 MG/ML IJ SOSY
PREFILLED_SYRINGE | INTRAMUSCULAR | Status: DC | PRN
  Administered 2024-04-29: .4 mg via INTRAVENOUS

## 2024-04-29 MED ORDER — PROPOFOL 500 MG/50ML IV EMUL
INTRAVENOUS | Status: DC | PRN
  Administered 2024-04-29: 125 ug/kg/min via INTRAVENOUS
  Administered 2024-04-29: 100 mg via INTRAVENOUS

## 2024-04-29 NOTE — ED Triage Notes (Signed)
 Pt brought over to ED from Short stay. Pt was here for a TEE procedure but after beginning procedure it was stopped d/t pt having to much secretion per pacu nurse. Pt was sob before procedure and given one duo neb. Pt is 96% on RA. Pt has pitting edema in BLE. Pt was given 20mg  lasix IV by PACU nurse and has had 600cc out put since.

## 2024-04-29 NOTE — ED Provider Notes (Signed)
 " [place] EMERGENCY DEPARTMENT AT [facility] Provider Note   CSN: [id number] Arrival date & time: 04/29/24  1201     Patient presents with: Shortness of Breath   Billy Hall is a 79 y.o. male.    Shortness of Breath    This patient is a 79 year old male, there is a known history of hypothyroidism, hypertension and diabetes, the patient was also known to have lung cancer and was sent for a trans esophageal echocardiogram today.  During that time the patient had some hypoxia and the chest x-ray had shown some vascular congestion, for this reason the patient was sent to the emergency department, the cardiologist that saw the patient requested that he be admitted to the hospital and diuresed.  At this time the patient is on 1 L of oxygen and feeling better although he is still little bit short of breath.  Prior to Admission medications  Medication Sig Start Date End Date Taking? Authorizing Provider  acetaminophen  (TYLENOL ) 650 MG CR tablet Take 650 mg by mouth every 8 (eight) hours as needed for pain.    [provider]  albuterol  (VENTOLIN  HFA) 108 (90 Base) MCG/ACT inhaler Inhale 2 puffs into the lungs every 6 (six) hours as needed for wheezing or shortness of breath. 01/15/22   Maree, Pratik D, DO  alfuzosin  (UROXATRAL ) 10 MG 24 hr tablet Take 1 tablet (10 mg total) by mouth 2 (two) times daily. 01/13/24   McKenzie, Belvie CROME, MD  aspirin  EC 81 MG tablet Take 81 mg by mouth daily.    [provider]  cholecalciferol  (VITAMIN D3) 25 MCG (1000 UNIT) tablet Take 1,000 Units by mouth daily.    [provider]  levothyroxine  (SYNTHROID ) 100 MCG tablet Take 100 mcg by mouth daily. 05/03/21   [provider]  losartan  (COZAAR ) 50 MG tablet Take 50 mg by mouth daily.     [provider]  magnesium oxide (MAG-OX) 400 (240 Mg) MG tablet Take 400 mg by mouth daily.    [provider]  metFORMIN (GLUCOPHAGE) 500 MG tablet Take  500 mg by mouth 2 (two) times daily with a meal.     [provider]  naproxen  (NAPROSYN ) 500 MG tablet Take 1 tablet (500 mg total) by mouth 2 (two) times daily with a meal. 07/13/22   Cleotilde Rogue, MD  [facility] ULTRA test strip 1 each 2 (two) times daily. 03/03/20   [provider]  sildenafil (REVATIO) 20 MG tablet Take 20 mg by mouth daily as needed (ED).     [provider]  simvastatin  (ZOCOR ) 10 MG tablet Take 10 mg by mouth every evening.     [provider]    Allergies: Ace inhibitors    Review of Systems  Respiratory:  Positive for shortness of breath.   All other systems reviewed and are negative.   Updated Vital Signs There were no vitals taken for this visit.  Physical Exam Vitals and nursing note reviewed.  Constitutional:      General: He is not in acute distress.    Appearance: He is well-developed.  HENT:     Head: Normocephalic and atraumatic.     Mouth/Throat:     Pharynx: No oropharyngeal exudate.  Eyes:     General: No scleral icterus.       Right eye: No discharge.        Left eye: No discharge.     Conjunctiva/sclera: Conjunctivae normal.  Pupils: Pupils are equal, round, and reactive to light.  Neck:     Thyroid : No thyromegaly.     Vascular: No JVD.  Cardiovascular:     Rate and Rhythm: Normal rate and regular rhythm.     Heart sounds: Normal heart sounds. No murmur heard.    No friction rub. No gallop.  Pulmonary:     Effort: Pulmonary effort is normal. No respiratory distress.     Breath sounds: Normal breath sounds. No wheezing or rales.  Abdominal:     General: Bowel sounds are normal. There is no distension.     Palpations: Abdomen is soft. There is no mass.     Tenderness: There is no abdominal tenderness.  Musculoskeletal:        General: No tenderness. Normal range of motion.     Cervical back: Normal range of motion and neck supple.  Lymphadenopathy:     Cervical: No cervical adenopathy.   Skin:    General: Skin is warm and dry.     Findings: No erythema or rash.  Neurological:     Mental Status: He is alert.     Coordination: Coordination normal.  Psychiatric:        Behavior: Behavior normal.     (all labs ordered are listed, but only abnormal results are displayed) Labs Reviewed - No data to display  EKG: None  Radiology: [facility] Chest Port 1 View Result Date: 04/29/2024 CLINICAL DATA:  Sarcoidosis of lung. EXAM: PORTABLE CHEST 1 VIEW COMPARISON:  Chest CT 12/07/2023 FINDINGS: Right chest port in place. Volume loss in the right hemithorax with right suprahilar opacity, grossly stable compared to prior CT. Right hilar retraction. No focal left lung opacity. No pneumothorax. No pulmonary edema. The heart is stable in size. On limited assessment, no acute osseous findings. IMPRESSION: Volume loss in the right hemithorax with right suprahilar opacity, grossly stable compared to prior CT. No new or acute findings. Electronically Signed   By: Andrea Gasman M.D.   On: 04/29/2024 11:55     Procedures   Medications Ordered in the ED - No data to display                                  Medical Decision Making Amount and/or Complexity of Data Reviewed Labs: ordered.  Risk Decision regarding hospitalization.   I have reviewed the medical record, the patient had an echocardiogram that was performed approximately 5-1/2 weeks ago, during that time there was normal left ventricular function, diastolic function and RV function with no valvular heart disease  A CT scan with contrast from August 2024 showed an hypodensity in the right atrial appendage considering thrombus versus mixing artifact.  A coronary CT scan in November on the 25th showed mild nonobstructive coronary disease with a dilated pulmonary artery the patient declined a VQ scan to rule out pulmonary embolism, today he was getting an echocardiogram by the transesophageal route to further evaluate anatomy  Now  that he is short of breath and slightly hypoxic will need to be admitted for diuresis  Received a single dose of 20 mg of Lasix while in the PACU, they request IV Lasix 80 mg twice daily in the hospital.  Will talk with hospitalist  Labs:  I  personally viewed and interpreted the labs which show unremarkable CBC, metabolic panel without any significant acute findings  I discussed the case with Dr. Vicci who  will admit the patient to the hospital      Final diagnoses:  None    ED Discharge Orders     None          Cleotilde Rogue, MD 04/29/24 1459  "

## 2024-04-29 NOTE — Progress Notes (Signed)
 Maurilio Hamilton friend that is with patient,called @ [phone] notified of patient being taken to ED room and for her to wait in ED waiting area, ED RN Leandrew Abler, notified that Ms. Hamilton is in waiting area

## 2024-04-29 NOTE — Transfer of Care (Signed)
 Immediate Anesthesia Transfer of Care Note  Patient: Billy Hall  Procedure(s) Performed: ECHOCARDIOGRAM, TRANSESOPHAGEAL  Patient Location: PACU  Anesthesia Type:General  Level of Consciousness: awake, alert , and oriented  Airway & Oxygen Therapy: Patient Spontanous Breathing and Patient connected to nasal cannula oxygen  Post-op Assessment: Report given to RN and Post -op Vital signs reviewed and stable  Post vital signs: Reviewed and stable  Last Vitals:  Vitals Value Taken Time  BP 96/63   Temp 97.8   Pulse 94   Resp 19   SpO2 100%     Last Pain:  Vitals:   04/29/24 0751  TempSrc: Oral  PainSc: 0-No pain      Patients Stated Pain Goal: 5 (04/29/24 0751)  Complications: No notable events documented.

## 2024-04-29 NOTE — Plan of Care (Signed)
   Problem: Education: Goal: Ability to describe self-care measures that may prevent or decrease complications (Diabetes Survival Skills Education) will improve Outcome: Progressing Goal: Individualized Educational Video(s) Outcome: Progressing

## 2024-04-29 NOTE — Interval H&P Note (Signed)
 History and Physical Interval Note:  04/29/2024 10:01 AM  Billy Hall  has presented today for surgery, with the diagnosis of eval for right atrial thrombos.  The various methods of treatment have been discussed with the patient and family. After consideration of risks, benefits and other options for treatment, the patient has consented to  Procedures: ECHOCARDIOGRAM, TRANSESOPHAGEAL (N/A) as a surgical intervention.  The patient's history has been reviewed, patient examined, no change in status, stable for surgery.  I have reviewed the patient's chart and labs.  Questions were answered to the patient's satisfaction.     Ione Sandusky P Laron Angelini, MD [facility]

## 2024-04-29 NOTE — Progress Notes (Signed)
 Patient to be transferred to ED for admission, Report given to [facility] ED charge nurse

## 2024-04-29 NOTE — Anesthesia Postprocedure Evaluation (Signed)
"   Anesthesia Post Note  Patient: Billy Hall  Procedure(s) Performed: ECHOCARDIOGRAM, TRANSESOPHAGEAL  Patient location during evaluation: PACU Anesthesia Type: General Level of consciousness: awake and alert Pain management: pain level controlled Vital Signs Assessment: post-procedure vital signs reviewed and stable Respiratory status: spontaneous breathing, patient connected to nasal cannula oxygen and respiratory function unstable Cardiovascular status: blood pressure returned to baseline Postop Assessment: no apparent nausea or vomiting Anesthetic complications: no Comments: CXR shows worsening congestion Patient denies chest pain but still feels SOB Spo2 99% on 4 liters o2 20 mg Lasix given Discussed with cardiologist the need for the patient to be admitted for further treatment   No notable events documented.   Last Vitals:  Vitals:   04/29/24 0751  BP: (!) 143/76  Pulse: 67  Resp: 16  Temp: 36.7 C  SpO2: 95%    Last Pain:  Vitals:   04/29/24 0751  TempSrc: Oral  PainSc: 0-No pain                 Andrea Limes      "

## 2024-04-29 NOTE — Consult Note (Addendum)
 "   CARDIOLOGY CONSULT NOTE    Patient ID: Billy Hall; [id number]; May 12, 1945   Admit date: 04/29/2024 Date of Consult: 04/29/2024  Primary Care Provider: Marvine Rush, MD Primary Cardiologist:  Primary Electrophysiologist:    History of Present Illness:   Mr. Billy Hall is a 79 y/o M known to have lung cancer was sent from outpatient TEE suite for hypoxia during TEE. CXR showed pulmonary vascular congestion, personally interpreted by me. Patient reported no worsening SOB/PND, cough or leg swelling but after talking to his friend, Maurilio Hamilton she reported that she noticed worsening DOE, PND and productive cough of clear sputum in the last one month. No angina.  Past Medical History:  Diagnosis Date   Arthritis    Depressive disorder    Diabetes mellitus without complication (HCC)    Fatigue    Headache(784.0)    Hyperlipidemia    Hypertension    Hypothyroidism    Lung cancer (HCC)    Snoring     Past Surgical History:  Procedure Laterality Date   COLONOSCOPY N/A 03/08/2013   Procedure: COLONOSCOPY;  Surgeon: Oneil DELENA Budge, MD;  Location: [facility]ITE;  Service: Gastroenterology;  Laterality: N/A;   IR IMAGING GUIDED PORT INSERTION  03/08/2020   IR RADIOLOGIST EVAL & MGMT  04/15/2024   IR US  GUIDE BX ASP/DRAIN  03/08/2020   KNEE ARTHROSCOPY Right    2006 By Dr. Brenna at [facility].    THYROID  SURGERY     VIDEO BRONCHOSCOPY WITH ENDOBRONCHIAL ULTRASOUND N/A 01/30/2020   Procedure: VIDEO BRONCHOSCOPY WITH ENDOBRONCHIAL ULTRASOUND;  Surgeon: Kerrin Elspeth BROCKS, MD;  Location: [facility] OR;  Service: Thoracic;  Laterality: N/A;       Inpatient Medications: Scheduled Meds:  furosemide  20 mg Intravenous Once   ipratropium-albuterol        Continuous Infusions:  lactated ringers  10 mL/hr at 04/29/24 0807   lactated ringers      PRN Meds: ipratropium-albuterol   Allergies:   Allergies[1]  Social History:   Social History   Socioeconomic History   Marital status:  Widowed    Spouse name: Not on file   Number of children: 1   Years of education: High Schol   Highest education level: Not on file  Occupational History   Occupation: Retired  Tobacco Use   Smoking status: Former   Smokeless tobacco: Never   Tobacco comments:    Quit 2004  Vaping Use   Vaping status: Never Used  Substance and Sexual Activity   Alcohol use: Not Currently    Alcohol/week: 0.0 standard drinks of alcohol    Comment: has quit since 2001   Drug use: No   Sexual activity: Not on file  Other Topics Concern   Not on file  Social History Narrative   I cup of coffee a day, drinks some Green tea   Social Drivers of Health   Tobacco Use: Medium Risk (04/29/2024)   Patient History    Smoking Tobacco Use: Former    Smokeless Tobacco Use: Never    Passive Exposure: Not on Actuary Strain: Low Risk (12/05/2022)   Overall Financial Resource Strain (CARDIA)    Difficulty of Paying Living Expenses: Not very hard  Food Insecurity: No Food Insecurity (01/14/2022)   Hunger Vital Sign    Worried About Running Out of Food in the Last Year: Never true    Ran Out of Food in the Last Year: Never true  Transportation Needs: No Transportation Needs (12/05/2022)  PRAPARE - Administrator, Civil Service (Medical): No    Lack of Transportation (Non-Medical): No  Physical Activity: Inactive (12/05/2022)   Exercise Vital Sign    Days of Exercise per Week: 0 days    Minutes of Exercise per Session: 0 min  Stress: Not on file  Social Connections: Moderately Integrated (12/05/2022)   Social Connection and Isolation Panel    Frequency of Communication with Friends and Family: More than three times a week    Frequency of Social Gatherings with Friends and Family: More than three times a week    Attends Religious Services: 1 to 4 times per year    Active Member of [employer] or Organizations: No    Attends Banker Meetings: 1 to 4 times per year    Marital  Status: Widowed  Intimate Partner Violence: Not At Risk (01/14/2022)   Humiliation, Afraid, Rape, and Kick questionnaire    Fear of Current or Ex-Partner: No    Emotionally Abused: No    Physically Abused: No    Sexually Abused: No  Depression (PHQ2-9): Not on file  Alcohol Screen: Not on file  Housing: Unknown (05/29/2023)   Received from [facility]   Epic    Unable to Pay for Housing in the Last Year: Not on file    Number of Times Moved in the Last Year: Not on file    At any time in the past 12 months, were you homeless or living in a shelter (including now)?: No  Utilities: Not At Risk (12/05/2022)   [facility] Utilities    Threatened with loss of utilities: No  Health Literacy: Not on file    Family History:    Family History  Problem Relation Age of Onset   Alzheimer's disease Mother      ROS:  Please see the history of present illness.  ROS  All other ROS reviewed and negative.     Physical Exam/Data:   Vitals:   04/29/24 0751 04/29/24 1048  BP: (!) 143/76 131/81  Pulse: 67 95  Resp: 16 (!) 22  Temp: 98 F (36.7 C) 97.8 F (36.6 C)  TempSrc: Oral   SpO2: 95% 98%  Weight: 122.9 kg   Height: 5' 9 (1.753 m)     Intake/Output Summary (Last 24 hours) at 04/29/2024 1111 Last data filed at 04/29/2024 1042 Gross per 24 hour  Intake 500 ml  Output --  Net 500 ml   Filed Weights   04/29/24 0751  Weight: 122.9 kg   Body mass index is 40.01 kg/m.  General:  Well nourished, well developed, in no acute distress HEENT: normal Lymph: no adenopathy Neck: difficult to assess volume status due to body habitus Endocrine:  No thryomegaly Vascular: No carotid bruits; FA pulses 2+ bilaterally without bruits  Cardiac:  normal S1, S2; RRR; no murmur  Lungs:  wheezing Abd: soft, nontender, no hepatomegaly  Ext: edema Musculoskeletal:  No deformities, BUE and BLE strength normal and equal Skin: warm and dry  Neuro:  CNs 2-12 intact, no focal abnormalities  noted Psych:  Normal affect    Laboratory Data:  ChemistryNo results for input(s): NA, K, CL, CO2, GLUCOSE, BUN, CREATININE, CALCIUM, GFRNONAA, GFRAA, ANIONGAP in the last 168 hours.  No results for input(s): PROT, ALBUMIN, AST, ALT, ALKPHOS, BILITOT in the last 168 hours. HematologyNo results for input(s): WBC, RBC, HGB, HCT, MCV, MCH, MCHC, RDW, PLT in the last 168 hours. Cardiac EnzymesNo results for input(s):  TROPONINI in the last 168 hours. No results for input(s): TROPIPOC in the last 168 hours.  BNPNo results for input(s): BNP, PROBNP in the last 168 hours.  DDimer No results for input(s): DDIMER in the last 168 hours.  Radiology/Studies:  No results found.  Assessment and Plan:   Acute diastolic heart failure - Hypoxia during TEE, took only a couple of pictures and removed the probe. Saturating fine now. CXR obtained in the PACU showed pulmonary vascular congestion. He has a Hx of lung cancer and residual R lung fibrosis. - Patient reported no worsening of his symptoms. But after talking to his friend, Maurilio Hamilton, she reported patient was having worsening DOE, PND and productive cough of clear sputum in the last one month. - Received IV Lasix 20 mg x 1 in the PACU, 200 ml UO so far. To ER. Needs admission to hospitalist team for IV diuresis. Start IV Lasix 80 mg BID. - Obtain basic labs including CBC, CMP, BNP etc., - Once he is volume optimized, he will be low risk for kidney biopsy to r/o renal cancer. Does not have to wait until TEE is performed. Will re-schedule TEE after he is volume optimized in one month. - Echo in 02/2024 is normal.  Pulmonary HTN - IV diuresis as above.  Imaging evidence of possible RA thrombus - TEE after volume optimized in one month.  Mild CAD by CCTA - No angina. ADHF, plan as above.   75 minutes spent in reviewing prior records, reports, >3 labs, discussion and documentation. EDP  is aware.  For questions or updates, please contact [facility] Please consult [email or web address] for contact info under Cardiology/STEMI.   Signed, Linnet Bottari Priya Avonne Berkery, MD 04/29/2024 11:11 AM      [1]  Allergies Allergen Reactions   Ace Inhibitors Swelling and Rash   "

## 2024-04-29 NOTE — Anesthesia Preprocedure Evaluation (Addendum)
"                                    Anesthesia Evaluation  Patient identified by MRN, date of birth, ID band Patient awake    Reviewed: Allergy & Precautions, H&P , NPO status , Patient's Chart, lab work & pertinent test results  Airway Mallampati: III  TM Distance: >3 FB Neck ROM: Full    Dental no notable dental hx.    Pulmonary former smoker Hx lung cancer Pulmonary htn  Audible wheezing while talking Duoneb ordered preop   + decreased breath sounds+ wheezing + stridor     Cardiovascular hypertension, pulmonary hypertensionNormal cardiovascular exam Rhythm:Regular Rate:Normal     Neuro/Psych  PSYCHIATRIC DISORDERS  Depression    negative neurological ROS     GI/Hepatic negative GI ROS, Neg liver ROS,,,  Endo/Other  diabetesHypothyroidism  Class 3 obesity  Renal/GU negative Renal ROS  negative genitourinary   Musculoskeletal  (+) Arthritis ,    Abdominal   Peds negative pediatric ROS (+)  Hematology negative hematology ROS (+)   Anesthesia Other Findings   Reproductive/Obstetrics negative OB ROS                              Anesthesia Physical Anesthesia Plan  ASA: 4  Anesthesia Plan: General   Post-op Pain Management:    Induction: Intravenous  PONV Risk Score and Plan:   Airway Management Planned: Nasal Cannula and Natural Airway  Additional Equipment:   Intra-op Plan:   Post-operative Plan:   Informed Consent: I have reviewed the patients History and Physical, chart, labs and discussed the procedure including the risks, benefits and alternatives for the proposed anesthesia with the patient or authorized representative who has indicated his/her understanding and acceptance.     Dental advisory given  Plan Discussed with: CRNA  Anesthesia Plan Comments:          Anesthesia Quick Evaluation  "

## 2024-04-29 NOTE — H&P (Signed)
 " History and Physical  [facility]  ROLLIE Hall FMW:[id number] DOB: 24-Apr-1946 DOA: 04/29/2024  PCP: Marvine Rush, MD  Patient coming from: sent from echo lab  Level of care: Telemetry  I have personally briefly reviewed patient's old medical records in [facility]  Chief Complaint: DOE  HPI: Billy Hall is a 79 year old male with hypertension, type II DM, hyperlipidemia, hypothyroidism, history of non-small cell lung cancer, HFpEF, pulmonary hypertension had presented to the echo lab today for an outpatient TEE and during the procedure notably started having shortness of breath and hypoxia.  He had been having some increasing edema in his lower extremities and DOE recently.  He also reported productive cough with whitish sputum over the past month.  He denied having chest pain and shortness of breath.  He was given IV Lasix 20 mg and chest x-ray was abnormal.  Cardiology recommended he be admitted into the hospital for diuresis.   Past Medical History:  Diagnosis Date   Arthritis    Depressive disorder    Diabetes mellitus without complication (HCC)    Fatigue    Headache(784.0)    Hyperlipidemia    Hypertension    Hypothyroidism    Lung cancer (HCC)    Snoring     Past Surgical History:  Procedure Laterality Date   COLONOSCOPY N/A 03/08/2013   Procedure: COLONOSCOPY;  Surgeon: Oneil DELENA Budge, MD;  Location: [facility] ENDO SUITE;  Service: Gastroenterology;  Laterality: N/A;   IR IMAGING GUIDED PORT INSERTION  03/08/2020   IR RADIOLOGIST EVAL & MGMT  04/15/2024   IR US  GUIDE BX ASP/DRAIN  03/08/2020   KNEE ARTHROSCOPY Right    2006 By Dr. Brenna at [facility].    THYROID  SURGERY     VIDEO BRONCHOSCOPY WITH ENDOBRONCHIAL ULTRASOUND N/A 01/30/2020   Procedure: VIDEO BRONCHOSCOPY WITH ENDOBRONCHIAL ULTRASOUND;  Surgeon: Kerrin Elspeth BROCKS, MD;  Location: [facility] OR;  Service: Thoracic;  Laterality: N/A;     reports that he has quit smoking. He has never used  smokeless tobacco. He reports that he does not currently use alcohol. He reports that he does not use drugs.  Allergies[1]  Family History  Problem Relation Age of Onset   Alzheimer's disease Mother     Prior to Admission medications  Medication Sig Start Date End Date Taking? Authorizing Provider  acetaminophen  (TYLENOL ) 650 MG CR tablet Take 650 mg by mouth every 8 (eight) hours as needed for pain.    [provider]  albuterol  (VENTOLIN  HFA) 108 (90 Base) MCG/ACT inhaler Inhale 2 puffs into the lungs every 6 (six) hours as needed for wheezing or shortness of breath. 01/15/22   Maree, Pratik D, DO  alfuzosin  (UROXATRAL ) 10 MG 24 hr tablet Take 1 tablet (10 mg total) by mouth 2 (two) times daily. 01/13/24   McKenzie, Belvie CROME, MD  aspirin  EC 81 MG tablet Take 81 mg by mouth daily.    [provider]  cholecalciferol  (VITAMIN D3) 25 MCG (1000 UNIT) tablet Take 1,000 Units by mouth daily.    [provider]  levothyroxine  (SYNTHROID ) 100 MCG tablet Take 100 mcg by mouth daily. 05/03/21   [provider]  losartan  (COZAAR ) 50 MG tablet Take 50 mg by mouth daily.     [provider]  magnesium oxide (MAG-OX) 400 (240 Mg) MG tablet Take 400 mg by mouth daily.    [provider]  metFORMIN (GLUCOPHAGE) 500 MG tablet Take 500 mg by mouth 2 (two)  times daily with a meal.     [provider]  naproxen  (NAPROSYN ) 500 MG tablet Take 1 tablet (500 mg total) by mouth 2 (two) times daily with a meal. 07/13/22   Cleotilde Rogue, MD  [facility] ULTRA test strip 1 each 2 (two) times daily. 03/03/20   [provider]  sildenafil (REVATIO) 20 MG tablet Take 20 mg by mouth daily as needed (ED).     [provider]  simvastatin  (ZOCOR ) 10 MG tablet Take 10 mg by mouth every evening.     [provider]    Physical Exam: Vitals:   04/29/24 1400 04/29/24 1500 04/29/24 1515 04/29/24 1553  BP: 123/73 107/76 119/78 (!) 143/78   Pulse: 91 91 88 80  Resp: 16 18 18    Temp:    98.4 F (36.9 C)  TempSrc:    Oral  SpO2: 93% 93% 92% 99%  Weight:      Height:       Constitutional: NAD, calm, comfortable Eyes: PERRL, lids and conjunctivae normal ENMT: Mucous membranes are moist. Posterior pharynx clear of any exudate or lesions.Normal dentition.  Neck: normal, supple, no masses, no thyromegaly Respiratory: clear to auscultation bilaterally, no wheezing, no crackles. Normal respiratory effort. No accessory muscle use.  Cardiovascular: normal s1, s2 sounds, no murmurs / rubs / gallops. 1+ bilateral lower extremity edema. 2+ pedal pulses. No carotid bruits.  Abdomen: no tenderness, no masses palpated. No hepatosplenomegaly. Bowel sounds positive.  Musculoskeletal: 1+ edema BLEs, no clubbing / cyanosis. No joint deformity upper and lower extremities. Good ROM, no contractures. Normal muscle tone.  Skin: no rashes, lesions, ulcers. No induration Neurologic: CN 2-12 grossly intact. Sensation intact, DTR normal. Strength 5/5 in all 4.  Psychiatric: Normal judgment and insight. Alert and oriented x 3. Normal mood.   Labs on Admission: I have personally reviewed following labs and imaging studies  CBC: Recent Labs  Lab 04/29/24 1249  WBC 5.6  NEUTROABS 4.5  HGB 12.8*  HCT 41.1  MCV 85.4  PLT 188   Basic Metabolic Panel: Recent Labs  Lab 04/29/24 1249  NA 141  K 4.1  CL 102  CO2 26  GLUCOSE 144*  BUN 16  CREATININE 0.90  CALCIUM 8.9   GFR: Estimated Creatinine Clearance: 87.6 mL/min (by C-G formula based on SCr of 0.9 mg/dL). Liver Function Tests: No results for input(s): AST, ALT, ALKPHOS, BILITOT, PROT, ALBUMIN in the last 168 hours. No results for input(s): LIPASE, AMYLASE in the last 168 hours. No results for input(s): AMMONIA in the last 168 hours. Coagulation Profile: No results for input(s): INR, PROTIME in the last 168 hours. Cardiac Enzymes: No results for input(s):  CKTOTAL, CKMB, CKMBINDEX, TROPONINI in the last 168 hours. BNP (last 3 results) No results for input(s): PROBNP in the last 8760 hours. HbA1C: No results for input(s): HGBA1C in the last 72 hours. CBG: Recent Labs  Lab 04/29/24 0752  GLUCAP 113*   Lipid Profile: No results for input(s): CHOL, HDL, LDLCALC, TRIG, CHOLHDL, LDLDIRECT in the last 72 hours. Thyroid  Function Tests: No results for input(s): TSH, T4TOTAL, FREET4, T3FREE, THYROIDAB in the last 72 hours. Anemia Panel: No results for input(s): VITAMINB12, FOLATE, FERRITIN, TIBC, IRON, RETICCTPCT in the last 72 hours. Urine analysis:    Component Value Date/Time   COLORURINE AMBER (A) 01/13/2022 0728   APPEARANCEUR Clear 04/06/2024 1457   LABSPEC 1.020 01/13/2022 0728   PHURINE 6.0 01/13/2022 0728   GLUCOSEU Negative 04/06/2024 1457   HGBUR  MODERATE (A) 01/13/2022 0728   BILIRUBINUR Negative 04/06/2024 1457   KETONESUR 5 (A) 01/13/2022 0728   PROTEINUR Negative 04/06/2024 1457   PROTEINUR 100 (A) 01/13/2022 0728   NITRITE Negative 04/06/2024 1457   NITRITE NEGATIVE 01/13/2022 0728   LEUKOCYTESUR Negative 04/06/2024 1457   LEUKOCYTESUR NEGATIVE 01/13/2022 0728    Radiological Exams on Admission: DG Chest Port 1 View Result Date: 04/29/2024 CLINICAL DATA:  Sarcoidosis of lung. EXAM: PORTABLE CHEST 1 VIEW COMPARISON:  Chest CT 12/07/2023 FINDINGS: Right chest port in place. Volume loss in the right hemithorax with right suprahilar opacity, grossly stable compared to prior CT. Right hilar retraction. No focal left lung opacity. No pneumothorax. No pulmonary edema. The heart is stable in size. On limited assessment, no acute osseous findings. IMPRESSION: Volume loss in the right hemithorax with right suprahilar opacity, grossly stable compared to prior CT. No new or acute findings. Electronically Signed   By: Andrea Gasman M.D.   On: 04/29/2024 11:55   EKG: Independently  reviewed.   Assessment/Plan Principal Problem:   Acute heart failure with preserved ejection fraction (HFpEF) (HCC) Active Problems:   Malignant neoplasm of upper lobe of right lung (HCC)   Port-A-Cath in place   BPH with obstruction/lower urinary tract symptoms   Hypoxia   Essential hypertension   Hyperlipidemia   Hypothyroidism   DM II (diabetes mellitus, type II), controlled (HCC)   Left renal mass   Thrombus of right atrial appendage   Pulmonary hypertension, unspecified (HCC)   Acute HFpEF -- pt is already responding to IV furosemide given -- Cardiologist recommended IV furosemide 80 mg twice daily -- Monitor electrolytes closely and potassium supplement as needed -- Check proBNP -- Fluid restrictions -- Monitor daily weight, intake and output -- Daily Reds vest reading -- TED hose to legs and elevate legs as able -- Sodium restricted diet  Type 2 diabetes mellitus -- Follow-up A1c testing -- Continue SSI coverage and CBG monitoring as ordered  Hyperlipidemia -- Resume home medication management  Hypothyroidism -- Resume home oral levothyroxine   Pulmonary hypertension -- Resume home medication management with sildenafil  BPH -- Resume home medications and follow intake and output  DVT prophylaxis: Enoxaparin Code Status: Full Family Communication: Updated at bedside Disposition Plan: Anticipate home Consults called:   Admission status: INP Time spent: 64 mins   Level of care: Telemetry Afton Louder MD [facility] How to contact the [facility] Attending or Consulting provider 7A - 7P or covering provider during after hours 7P -7A, for this patient?  Check the care team in [facility] and look for a) attending/consulting TR[facility] provider listed and b) the [facility] team listed Log into [email or web address] and use [place]'s universal password to access. If you do not have the password, please contact the hospital operator. Locate the [facility] provider you are looking for under  [facility] and page to a number that you can be directly reached. If you still have difficulty reaching the provider, please page the [facility] (Director on Call) for the Hospitalists listed on amion for assistance.   If 7PM-7AM, please contact night-coverage [email or web address] Password [email or web address]  04/29/2024, 4:18 PM        [1]  Allergies Allergen Reactions   Ace Inhibitors Swelling and Rash   "

## 2024-04-29 NOTE — Hospital Course (Addendum)
 79 year old male with hypertension, type II DM, hyperlipidemia, hypothyroidism, history of non-small cell lung cancer, HFpEF, pulmonary hypertension had presented to the echo lab today for an outpatient TEE and during the procedure notably started having shortness of breath and hypoxia.  He had been having some increasing edema in his lower extremities and DOE recently.  He also reported productive cough with whitish sputum over the past month.  He denied having chest pain and shortness of breath.  He was given IV Lasix 20 mg and chest x-ray was abnormal.  Cardiology recommended he be admitted into the hospital for diuresis.

## 2024-04-30 ENCOUNTER — Inpatient Hospital Stay (HOSPITAL_COMMUNITY)

## 2024-04-30 DIAGNOSIS — E785 Hyperlipidemia, unspecified: Secondary | ICD-10-CM | POA: Diagnosis not present

## 2024-04-30 DIAGNOSIS — E039 Hypothyroidism, unspecified: Secondary | ICD-10-CM | POA: Diagnosis not present

## 2024-04-30 DIAGNOSIS — I272 Pulmonary hypertension, unspecified: Secondary | ICD-10-CM | POA: Diagnosis not present

## 2024-04-30 DIAGNOSIS — N138 Other obstructive and reflux uropathy: Secondary | ICD-10-CM | POA: Diagnosis not present

## 2024-04-30 DIAGNOSIS — R0902 Hypoxemia: Secondary | ICD-10-CM

## 2024-04-30 DIAGNOSIS — E119 Type 2 diabetes mellitus without complications: Secondary | ICD-10-CM | POA: Diagnosis not present

## 2024-04-30 DIAGNOSIS — I5031 Acute diastolic (congestive) heart failure: Secondary | ICD-10-CM

## 2024-04-30 DIAGNOSIS — I10 Essential (primary) hypertension: Secondary | ICD-10-CM

## 2024-04-30 DIAGNOSIS — N401 Enlarged prostate with lower urinary tract symptoms: Secondary | ICD-10-CM | POA: Diagnosis not present

## 2024-04-30 LAB — BASIC METABOLIC PANEL WITH GFR
Anion gap: 6 (ref 5–15)
BUN: 15 mg/dL (ref 8–23)
CO2: 36 mmol/L — ABNORMAL HIGH (ref 22–32)
Calcium: 9 mg/dL (ref 8.9–10.3)
Chloride: 98 mmol/L (ref 98–111)
Creatinine, Ser: 0.92 mg/dL (ref 0.61–1.24)
GFR, Estimated: >60 mL/min
Glucose, Bld: 106 mg/dL — ABNORMAL HIGH (ref 70–99)
Potassium: 3.8 mmol/L (ref 3.5–5.1)
Sodium: 140 mmol/L (ref 135–145)

## 2024-04-30 LAB — GLUCOSE, CAPILLARY
Glucose-Capillary: 101 mg/dL — ABNORMAL HIGH (ref 70–99)
Glucose-Capillary: 132 mg/dL — ABNORMAL HIGH (ref 70–99)
Glucose-Capillary: 112 mg/dL — ABNORMAL HIGH (ref 70–99)
Glucose-Capillary: 93 mg/dL (ref 70–99)
Glucose-Capillary: 95 mg/dL (ref 70–99)

## 2024-04-30 LAB — MAGNESIUM: Magnesium: 2.1 mg/dL (ref 1.7–2.4)

## 2024-04-30 MED ORDER — LEVOTHYROXINE SODIUM 125 MCG PO TABS
125.0000 ug | ORAL_TABLET | Freq: Every day | ORAL | Status: DC
  Administered 2024-05-01 – 2024-05-02 (×2): 125 ug via ORAL
  Filled 2024-04-30 (×2): qty 1

## 2024-04-30 MED ORDER — LOSARTAN POTASSIUM 50 MG PO TABS
50.0000 mg | ORAL_TABLET | Freq: Every day | ORAL | Status: DC
  Administered 2024-05-01 – 2024-05-02 (×2): 50 mg via ORAL
  Filled 2024-04-30 (×2): qty 1

## 2024-04-30 MED ORDER — ALFUZOSIN HCL ER 10 MG PO TB24
10.0000 mg | ORAL_TABLET | Freq: Two times a day (BID) | ORAL | Status: DC
  Administered 2024-04-30 – 2024-05-02 (×4): 10 mg via ORAL
  Filled 2024-04-30 (×4): qty 1

## 2024-04-30 MED ORDER — ASPIRIN 81 MG PO TBEC
81.0000 mg | DELAYED_RELEASE_TABLET | Freq: Every day | ORAL | Status: DC
  Administered 2024-04-30 – 2024-05-02 (×3): 81 mg via ORAL
  Filled 2024-04-30 (×3): qty 1

## 2024-04-30 MED ORDER — SIMVASTATIN 10 MG PO TABS
10.0000 mg | ORAL_TABLET | Freq: Every evening | ORAL | Status: DC
  Administered 2024-04-30 – 2024-05-01 (×2): 10 mg via ORAL
  Filled 2024-04-30 (×2): qty 1

## 2024-04-30 MED ORDER — VITAMIN D 25 MCG (1000 UNIT) PO TABS
1000.0000 [IU] | ORAL_TABLET | Freq: Every day | ORAL | Status: DC
  Administered 2024-04-30 – 2024-05-02 (×3): 1000 [IU] via ORAL
  Filled 2024-04-30 (×3): qty 1

## 2024-04-30 NOTE — Plan of Care (Signed)
  Problem: Fluid Volume: Goal: Ability to maintain a balanced intake and output will improve Outcome: Progressing   Problem: Metabolic: Goal: Ability to maintain appropriate glucose levels will improve Outcome: Progressing   Problem: Activity: Goal: Risk for activity intolerance will decrease Outcome: Progressing

## 2024-04-30 NOTE — Plan of Care (Signed)

## 2024-04-30 NOTE — Evaluation (Signed)
 Physical Therapy Evaluation Patient Details Name: Billy Hall MRN: [id number] DOB: 22-Oct-1945 Today's Date: 04/30/2024  History of Present Illness  Billy Hall is a 79 year old male with hypertension, type II DM, hyperlipidemia, hypothyroidism, history of non-small cell lung cancer, HFpEF, pulmonary hypertension had presented to the echo lab today for an outpatient TEE and during the procedure notably started having shortness of breath and hypoxia.  He had been having some increasing edema in his lower extremities and DOE recently.  He also reported productive cough with whitish sputum over the past month.  He denied having chest pain and shortness of breath.  He was given IV Lasix  20 mg and chest x-ray was abnormal.  Cardiology recommended he be admitted into the hospital for diuresis.   Clinical Impression  Patient functioning at baseline for functional mobility and gait. Patient is grossly WFL with good return for ambulating in room, hallways without loss of balance or need for an AD. Plan:  Patient discharged from physical therapy to care of nursing for ambulation daily as tolerated for length of stay.         If plan is discharge home, recommend the following: Assistance with cooking/housework   Can travel by private vehicle        Equipment Recommendations None recommended by PT  Recommendations for Other Services       Functional Status Assessment Patient has not had a recent decline in their functional status     Precautions / Restrictions Precautions Precautions: None Recall of Precautions/Restrictions: Intact Restrictions Weight Bearing Restrictions Per Provider Order: No      Mobility  Bed Mobility Overal bed mobility: Independent                  Transfers Overall transfer level: Independent                      Ambulation/Gait Ambulation/Gait assistance: Modified independent (Device/Increase time) Gait Distance (Feet): 150  Feet Assistive device: None Gait Pattern/deviations: WFL(Within Functional Limits) Gait velocity: near normal     General Gait Details: grossly WFL with good return for ambulating in room, hallways without loss of balance or need for an AD  Stairs            Wheelchair Mobility     Tilt Bed    Modified Rankin (Stroke Patients Only)       Balance Overall balance assessment: Independent                                           Pertinent Vitals/Pain Pain Assessment Pain Assessment: No/denies pain    Home Living Family/patient expects to be discharged to:: Private residence Living Arrangements: Alone Available Help at Discharge: Family;Available PRN/intermittently Type of Home: House Home Access: Ramped entrance     Alternate Level Stairs-Number of Steps: 7 Home Layout: Two level Home Equipment: Rolling Walker (2 wheels);Cane - single point;Wheelchair - Manufacturing Systems Engineer - built in      Prior Function Prior Level of Function : Independent/Modified Independent;Driving             Mobility Comments: Community ambulation without AD ADLs Comments: Independent, drives     Extremity/Trunk Assessment   Upper Extremity Assessment Upper Extremity Assessment: Defer to OT evaluation    Lower Extremity Assessment Lower Extremity Assessment: Overall WFL for tasks assessed    Cervical /  Trunk Assessment Cervical / Trunk Assessment: Normal  Communication   Communication Communication: No apparent difficulties    Cognition Arousal: Alert Behavior During Therapy: WFL for tasks assessed/performed   PT - Cognitive impairments: No apparent impairments                         Following commands: Intact       Cueing Cueing Techniques: Verbal cues     General Comments      Exercises     Assessment/Plan    PT Assessment Patient does not need any further PT services  PT Problem List         PT Treatment Interventions       PT Goals (Current goals can be found in the Care Plan section)  Acute Rehab PT Goals Patient Stated Goal: return home with family to assist PT Goal Formulation: With patient/family Time For Goal Achievement: 04/30/24 Potential to Achieve Goals: Good    Frequency       Co-evaluation               AM-PAC PT 6 Clicks Mobility  Outcome Measure Help needed turning from your back to your side while in a flat bed without using bedrails?: None Help needed moving from lying on your back to sitting on the side of a flat bed without using bedrails?: None Help needed moving to and from a bed to a chair (including a wheelchair)?: None Help needed standing up from a chair using your arms (e.g., wheelchair or bedside chair)?: None Help needed to walk in hospital room?: None Help needed climbing 3-5 steps with a railing? : None 6 Click Score: 24    End of Session   Activity Tolerance: Patient tolerated treatment well Patient left: in bed;with call bell/phone within reach;with family/visitor present Nurse Communication: Mobility status PT Visit Diagnosis: Unsteadiness on feet (R26.81);Other abnormalities of gait and mobility (R26.89);Muscle weakness (generalized) (M62.81)    Time: 9141-9084 PT Time Calculation (min) (ACUTE ONLY): 17 min   Charges:   PT Evaluation $PT Eval Low Complexity: 1 Low PT Treatments $Therapeutic Activity: 8-22 mins PT General Charges $$ ACUTE PT VISIT: 1 Visit         12:16 PM, 04/30/2024 Lynwood Music, MPT Physical Therapist with [facility] [phone] office [phone] mobile phone

## 2024-04-30 NOTE — Progress Notes (Addendum)
 " PROGRESS NOTE   Billy Hall  FMW:[id number] DOB: December 30, 1945 DOA: 04/29/2024 PCP: Marvine Rush, MD   Chief Complaint  Patient presents with   Shortness of Breath   Level of care: Telemetry  Brief Admission History:  79 year old male with hypertension, type II DM, hyperlipidemia, hypothyroidism, history of non-small cell lung cancer, HFpEF, pulmonary hypertension had presented to the echo lab today for an outpatient TEE and during the procedure notably started having shortness of breath and hypoxia.  He had been having some increasing edema in his lower extremities and DOE recently.  He also reported productive cough with whitish sputum over the past month.  He denied having chest pain and shortness of breath.  He was given IV Lasix  20 mg and chest x-ray was abnormal.  Cardiology recommended he be admitted into the hospital for diuresis.   Assessment and Plan:  Acute HFpEF -- pt is responding favorably to IV furosemide  given -- Cardiologist recommended IV furosemide  80 mg twice daily -- Monitor electrolytes closely and potassium supplement as needed -- Fluid restrictions -- Monitor daily weight, intake and output -- Daily Reds vest reading ordered/requested -- TED hose to legs and elevate legs as able -- Sodium restricted diet  Acute respiratory failure -- secondary to acute heart failure -- he has been weaned to room air oxygen   Filed Weights   04/29/24 1210 04/29/24 1707 04/30/24 0430  Weight: 123 kg 120.6 kg 118.8 kg    Intake/Output Summary (Last 24 hours) at 04/30/2024 1049 Last data filed at 04/30/2024 9060 Gross per 24 hour  Intake 720 ml  Output 3280 ml  Net -2560 ml   Type 2 diabetes mellitus -- Follow-up A1c testing -- Continue SSI coverage and CBG monitoring as ordered  CBG (last 3)  Recent Labs    04/29/24 2113 04/30/24 0254 04/30/24 0711  GLUCAP 116* 101* 132*   Hyperlipidemia -- Resumed home medication management   Hypothyroidism -- Resumed  home oral levothyroxine    Pulmonary hypertension -- Resumed home medication management with sildenafil   BPH -- Resumed home medications and follow intake and output  DVT prophylaxis: enoxaparin  Code Status: Full  Family Communication:  Disposition: home tomorrow if continues to improve    Consultants:   Procedures:   Antimicrobials:    Subjective: Pt says he has been urinating most of the night and not slept well.  He is having SOB but no CP.   Objective: Vitals:   04/29/24 2026 04/29/24 2352 04/30/24 0303 04/30/24 0430  BP:  137/79 130/70   Pulse: 72 88 78   Resp:      Temp:  98.9 F (37.2 C) 98.5 F (36.9 C)   TempSrc:  Oral Oral   SpO2: 97% 93% 92%   Weight:    118.8 kg  Height:        Intake/Output Summary (Last 24 hours) at 04/30/2024 1046 Last data filed at 04/30/2024 0939 Gross per 24 hour  Intake 720 ml  Output 3280 ml  Net -2560 ml   Filed Weights   04/29/24 1210 04/29/24 1707 04/30/24 0430  Weight: 123 kg 120.6 kg 118.8 kg   Examination:  General exam: Appears calm and comfortable  Respiratory system: no crackles heard.  Respiratory effort normal. Cardiovascular system: normal S1 & S2 heard. No JVD, murmurs, rubs, gallops or clicks. No pedal edema. Gastrointestinal system: Abdomen is nondistended, soft and nontender. No organomegaly or masses felt. Normal bowel sounds heard. Central nervous system: Alert and oriented. No focal  neurological deficits. Extremities: 1+ edema BLEs, Symmetric 5 x 5 power. Skin: No rashes, lesions or ulcers. Psychiatry: Judgement and insight appear normal. Mood & affect appropriate.   Data Reviewed: I have personally reviewed following labs and imaging studies  CBC: Recent Labs  Lab 04/29/24 1249  WBC 5.6  NEUTROABS 4.5  HGB 12.8*  HCT 41.1  MCV 85.4  PLT 188    Basic Metabolic Panel: Recent Labs  Lab 04/29/24 1249 04/30/24 0540  NA 141 140  K 4.1 3.8  CL 102 98  CO2 26 36*  GLUCOSE 144* 106*  BUN 16  15  CREATININE 0.90 0.92  CALCIUM 8.9 9.0  MG  --  2.1    CBG: Recent Labs  Lab 04/29/24 0752 04/29/24 1624 04/29/24 2113 04/30/24 0254 04/30/24 0711  GLUCAP 113* 115* 116* 101* 132*    No results found for this or any previous visit (from the past 240 hours).   Radiology Studies: DG Chest Port 1 View Result Date: 04/30/2024 EXAM: 1 VIEW(S) XRAY OF THE CHEST 04/30/2024 06:13:10 AM COMPARISON: 04/29/2024 CLINICAL HISTORY: Pulmonary edema. FINDINGS: LINES, TUBES AND DEVICES: Stable right chest port. LUNGS AND PLEURA: Unchanged right apical opacity consistent with post-treatment changes for known lung cancer. Persistent right hemithorax volume loss. No pleural effusion. No pneumothorax. HEART AND MEDIASTINUM: No acute abnormality of the cardiac and mediastinal silhouettes. BONES AND SOFT TISSUES: No acute osseous abnormality. IMPRESSION: 1. No radiographic evidence of pulmonary edema. 2. Stable post-treatment changes involving the right lung. Electronically signed by: Waddell Calk MD 04/30/2024 06:35 AM EST RP Workstation: [id number]   DG Chest Port 1 View Result Date: 04/29/2024 CLINICAL DATA:  Sarcoidosis of lung. EXAM: PORTABLE CHEST 1 VIEW COMPARISON:  Chest CT 12/07/2023 FINDINGS: Right chest port in place. Volume loss in the right hemithorax with right suprahilar opacity, grossly stable compared to prior CT. Right hilar retraction. No focal left lung opacity. No pneumothorax. No pulmonary edema. The heart is stable in size. On limited assessment, no acute osseous findings. IMPRESSION: Volume loss in the right hemithorax with right suprahilar opacity, grossly stable compared to prior CT. No new or acute findings. Electronically Signed   By: Andrea Gasman M.D.   On: 04/29/2024 11:55    Scheduled Meds:  enoxaparin  (LOVENOX ) injection  60 mg Subcutaneous Q24H   furosemide   80 mg Intravenous Q12H   insulin  aspart  0-15 Units Subcutaneous TID WC   insulin  aspart  3 Units Subcutaneous  TID WC   potassium chloride   40 mEq Oral Daily   sodium chloride  flush  3 mL Intravenous Q12H   Continuous Infusions:  sodium chloride       LOS: 1 day   Time spent: 55 mins  Jas Betten Vicci, MD How to contact the [employer] Attending or Consulting provider 7A - 7P or covering provider during after hours 7P -7A, for this patient?  Check the care team in [facility] and look for a) attending/consulting [facility] provider listed and b) the [facility] team listed Log into [email or web address] to find provider on call.  Locate the [facility] provider you are looking for under [facility] and page to a number that you can be directly reached. If you still have difficulty reaching the provider, please page the [facility] (Director on Call) for the Hospitalists listed on amion for assistance.  04/30/2024, 10:46 AM    "

## 2024-05-01 DIAGNOSIS — E785 Hyperlipidemia, unspecified: Secondary | ICD-10-CM | POA: Diagnosis not present

## 2024-05-01 DIAGNOSIS — I10 Essential (primary) hypertension: Secondary | ICD-10-CM | POA: Diagnosis not present

## 2024-05-01 DIAGNOSIS — I5031 Acute diastolic (congestive) heart failure: Secondary | ICD-10-CM | POA: Diagnosis not present

## 2024-05-01 DIAGNOSIS — E119 Type 2 diabetes mellitus without complications: Secondary | ICD-10-CM | POA: Diagnosis not present

## 2024-05-01 LAB — GLUCOSE, CAPILLARY
Glucose-Capillary: 101 mg/dL — ABNORMAL HIGH (ref 70–99)
Glucose-Capillary: 132 mg/dL — ABNORMAL HIGH (ref 70–99)
Glucose-Capillary: 199 mg/dL — ABNORMAL HIGH (ref 70–99)
Glucose-Capillary: 145 mg/dL — ABNORMAL HIGH (ref 70–99)
Glucose-Capillary: 151 mg/dL — ABNORMAL HIGH (ref 70–99)

## 2024-05-01 LAB — BASIC METABOLIC PANEL WITH GFR
Anion gap: 10 (ref 5–15)
BUN: 18 mg/dL (ref 8–23)
CO2: 32 mmol/L (ref 22–32)
Calcium: 9.2 mg/dL (ref 8.9–10.3)
Chloride: 93 mmol/L — ABNORMAL LOW (ref 98–111)
Creatinine, Ser: 0.94 mg/dL (ref 0.61–1.24)
GFR, Estimated: >60 mL/min
Glucose, Bld: 118 mg/dL — ABNORMAL HIGH (ref 70–99)
Potassium: 3.6 mmol/L (ref 3.5–5.1)
Sodium: 136 mmol/L (ref 135–145)

## 2024-05-01 NOTE — Plan of Care (Signed)
  Problem: Education: Goal: Ability to describe self-care measures that may prevent or decrease complications (Diabetes Survival Skills Education) will improve Outcome: Progressing   Problem: Fluid Volume: Goal: Ability to maintain a balanced intake and output will improve Outcome: Progressing   Problem: Health Behavior/Discharge Planning: Goal: Ability to identify and utilize available resources and services will improve Outcome: Progressing

## 2024-05-01 NOTE — Progress Notes (Signed)
 " Billy Hall  FMW:[id number] DOB: [birth year] DOA: 04/29/2024 PCP: Marvine Rush, MD   Chief Complaint  Patient presents with   Shortness of Breath   Level of care: Telemetry  Brief Admission History:  79 year old male with hypertension, type II DM, hyperlipidemia, hypothyroidism, history of non-small cell lung cancer, HFpEF, pulmonary hypertension had presented to the echo lab today for an outpatient TEE and during the procedure notably started having shortness of breath and hypoxia.  He had been having some increasing edema in his lower extremities and DOE recently.  He also reported productive cough with whitish sputum over the past month.  He denied having chest pain and shortness of breath.  He was given IV Lasix  20 mg and chest x-ray was abnormal.  Cardiology recommended he be admitted into the hospital for diuresis.   Assessment and Plan:  Acute HFpEF -- pt is responding favorably to IV furosemide  -- Cardiologist recommended IV furosemide  80 mg twice daily -- Monitor electrolytes closely and potassium supplement as needed -- Fluid restrictions -- Monitor daily weight, intake and output -- Daily Reds vest reading ordered/requested -- TED hose to legs and elevate legs as able -- Sodium restricted diet  Acute respiratory failure -- secondary to acute heart failure -- he has been weaned to room air oxygen   Filed Weights   04/29/24 1707 04/30/24 0430 05/01/24 0320  Weight: 120.6 kg 118.8 kg 117.4 kg    Intake/Output Summary (Last 24 hours) at 05/01/2024 1418 Last data filed at 05/01/2024 9193 Gross per 24 hour  Intake 243 ml  Output 2050 ml  Net -1807 ml   Type 2 diabetes mellitus -- Follow-up A1c testing -- Continue SSI coverage and CBG monitoring as ordered  CBG (last 3)  Recent Labs    05/01/24 0311 05/01/24 0713 05/01/24 1109  GLUCAP 101* 132* 199*   Hyperlipidemia -- Resumed home medication management   Hypothyroidism -- Resumed home  oral levothyroxine    Pulmonary hypertension -- Resumed home medication management with sildenafil   BPH -- Resumed home medications and follow intake and output  DVT prophylaxis: enoxaparin  Code Status: Full  Family Communication:  Disposition: home tomorrow if ok with cardiology team   Consultants:   Procedures:   Antimicrobials:    Subjective: Pt says he continues to fill up the urinals with current diuresis.   Objective: Vitals:   04/30/24 2035 05/01/24 0208 05/01/24 0320 05/01/24 1259  BP: 135/75  (!) 151/91 93/67  Pulse: 76  92 93  Resp: (!) 22 19  20   Temp: 99.3 F (37.4 C)  99 F (37.2 C) 98.2 F (36.8 C)  TempSrc: Oral  Oral Oral  SpO2: 93%  93% 95%  Weight:   117.4 kg   Height:        Intake/Output Summary (Last 24 hours) at 05/01/2024 1418 Last data filed at 05/01/2024 0806 Gross per 24 hour  Intake 243 ml  Output 2050 ml  Net -1807 ml   Filed Weights   04/29/24 1707 04/30/24 0430 05/01/24 0320  Weight: 120.6 kg 118.8 kg 117.4 kg   Examination:  General exam: Appears calm and comfortable  Respiratory system: no crackles heard.  Respiratory effort normal. Cardiovascular system: normal S1 & S2 heard. No JVD, murmurs, rubs, gallops or clicks. No pedal edema. Gastrointestinal system: Abdomen is nondistended, soft and nontender. No organomegaly or masses felt. Normal bowel sounds heard. Central nervous system: Alert and oriented. No focal neurological deficits. Extremities: trace edema BLEs,  Symmetric 5 x 5 power. Skin: No rashes, lesions or ulcers. Psychiatry: Judgement and insight appear normal. Mood & affect appropriate.   Data Reviewed: I have personally reviewed following labs and imaging studies  CBC: Recent Labs  Lab 04/29/24 1249  WBC 5.6  NEUTROABS 4.5  HGB 12.8*  HCT 41.1  MCV 85.4  PLT 188    Basic Metabolic Panel: Recent Labs  Lab 04/29/24 1249 04/30/24 0540 05/01/24 0534  NA 141 140 136  K 4.1 3.8 3.6  CL 102 98 93*  CO2  26 36* 32  GLUCOSE 144* 106* 118*  BUN 16 15 18   CREATININE 0.90 0.92 0.94  CALCIUM 8.9 9.0 9.2  MG  --  2.1  --     CBG: Recent Labs  Lab 04/30/24 1613 04/30/24 2138 05/01/24 0311 05/01/24 0713 05/01/24 1109  GLUCAP 93 95 101* 132* 199*    No results found for this or any previous visit (from the past 240 hours).   Radiology Studies: DG Chest Port 1 View Result Date: 04/30/2024 EXAM: 1 VIEW(S) XRAY OF THE CHEST 04/30/2024 06:13:10 AM COMPARISON: 04/29/2024 CLINICAL HISTORY: Pulmonary edema. FINDINGS: LINES, TUBES AND DEVICES: Stable right chest port. LUNGS AND PLEURA: Unchanged right apical opacity consistent with post-treatment changes for known lung cancer. Persistent right hemithorax volume loss. No pleural effusion. No pneumothorax. HEART AND MEDIASTINUM: No acute abnormality of the cardiac and mediastinal silhouettes. BONES AND SOFT TISSUES: No acute osseous abnormality. IMPRESSION: 1. No radiographic evidence of pulmonary edema. 2. Stable post-treatment changes involving the right lung. Electronically signed by: Taylor Stroud MD 04/30/2024 06:35 AM EST RP Workstation: [id number]    Scheduled Meds:  alfuzosin   10 mg Oral BID   aspirin  EC  81 mg Oral Daily   cholecalciferol   1,000 Units Oral Daily   enoxaparin  (LOVENOX ) injection  60 mg Subcutaneous Q24H   furosemide   80 mg Intravenous Q12H   insulin  aspart  0-15 Units Subcutaneous TID WC   insulin  aspart  3 Units Subcutaneous TID WC   levothyroxine   125 mcg Oral Q0600   losartan   50 mg Oral Daily   potassium chloride   40 mEq Oral Daily   simvastatin   10 mg Oral QPM   sodium chloride  flush  3 mL Intravenous Q12H   Continuous Infusions:    LOS: 2 days   Time spent: 55 mins  Ron Beske Vicci, MD How to contact the [facility] Attending or Consulting provider 7A - 7P or covering provider during after hours 7P -7A, for this patient?  Check the care team in [facility] and look for a) attending/consulting TRH provider listed and b)  the TRH team listed Log into [email or web address] to find provider on call.  Locate the TRH provider you are looking for under [facility] and page to a number that you can be directly reached. If you still have difficulty reaching the provider, please page the [facility] (Director on Call) for the Hospitalists listed on amion for assistance.  05/01/2024, 2:18 PM    "

## 2024-05-01 NOTE — Plan of Care (Signed)
" °  Problem: Fluid Volume: Goal: Ability to maintain a balanced intake and output will improve Outcome: Progressing   Problem: Coping: Goal: Ability to adjust to condition or change in health will improve Outcome: Progressing   Problem: Health Behavior/Discharge Planning: Goal: Ability to identify and utilize available resources and services will improve Outcome: Progressing Goal: Ability to manage health-related needs will improve Outcome: Progressing   Problem: Clinical Measurements: Goal: Ability to maintain clinical measurements within normal limits will improve Outcome: Progressing Goal: Will remain free from infection Outcome: Progressing Goal: Diagnostic test results will improve Outcome: Progressing Goal: Respiratory complications will improve Outcome: Progressing Goal: Cardiovascular complication will be avoided Outcome: Progressing   "

## 2024-05-02 ENCOUNTER — Encounter (HOSPITAL_COMMUNITY): Payer: Self-pay | Admitting: Internal Medicine

## 2024-05-02 DIAGNOSIS — N138 Other obstructive and reflux uropathy: Secondary | ICD-10-CM

## 2024-05-02 DIAGNOSIS — I5031 Acute diastolic (congestive) heart failure: Secondary | ICD-10-CM | POA: Diagnosis not present

## 2024-05-02 DIAGNOSIS — I272 Pulmonary hypertension, unspecified: Secondary | ICD-10-CM | POA: Diagnosis not present

## 2024-05-02 DIAGNOSIS — E039 Hypothyroidism, unspecified: Secondary | ICD-10-CM

## 2024-05-02 DIAGNOSIS — N401 Enlarged prostate with lower urinary tract symptoms: Secondary | ICD-10-CM

## 2024-05-02 DIAGNOSIS — I10 Essential (primary) hypertension: Secondary | ICD-10-CM | POA: Diagnosis not present

## 2024-05-02 LAB — BASIC METABOLIC PANEL WITH GFR
Anion gap: 9 (ref 5–15)
BUN: 23 mg/dL (ref 8–23)
CO2: 33 mmol/L — ABNORMAL HIGH (ref 22–32)
Calcium: 8.5 mg/dL — ABNORMAL LOW (ref 8.9–10.3)
Chloride: 94 mmol/L — ABNORMAL LOW (ref 98–111)
Creatinine, Ser: 1.03 mg/dL (ref 0.61–1.24)
GFR, Estimated: >60 mL/min
Glucose, Bld: 118 mg/dL — ABNORMAL HIGH (ref 70–99)
Potassium: 3.2 mmol/L — ABNORMAL LOW (ref 3.5–5.1)
Sodium: 135 mmol/L (ref 135–145)

## 2024-05-02 LAB — GLUCOSE, CAPILLARY
Glucose-Capillary: 103 mg/dL — ABNORMAL HIGH (ref 70–99)
Glucose-Capillary: 128 mg/dL — ABNORMAL HIGH (ref 70–99)
Glucose-Capillary: 173 mg/dL — ABNORMAL HIGH (ref 70–99)

## 2024-05-02 LAB — MAGNESIUM: Magnesium: 1.8 mg/dL (ref 1.7–2.4)

## 2024-05-02 MED ORDER — POTASSIUM CHLORIDE CRYS ER 20 MEQ PO TBCR: 20.0000 meq | EXTENDED_RELEASE_TABLET | Freq: Every day | ORAL | 1 refills | Status: AC

## 2024-05-02 MED ORDER — FUROSEMIDE 40 MG PO TABS: 40.0000 mg | ORAL_TABLET | Freq: Every day | ORAL | 1 refills | Status: AC

## 2024-05-02 NOTE — Progress Notes (Signed)
 Heart Failure Nurse Navigator Progress Note  PCP: Marvine Rush, MD PCP-Cardiologist: Diannah Maywood, MD Admission Diagnosis: Acute heart failure with preserved ejection fraction (HFpEF) [facility]) Admitted from: Sent from ECHO lab from his TEE  [facility] Patient- Contacted patient remotely to room 338 @ [facility].  2 Patient identifiers confirmed prior to patient conversation to provide CHF education. No CHF Clinic appointment scheduled because they had already scheduled him a HF hospital follow-up on the same date that he would have been scheduled at the CHF Clinic @ [facility].  Presentation:   Billy Hall is a 79 y.o. male.  He presented with shortness if breath and hypoxia during outpatient TEE.  He has a history of hypertension, Type II DM, Hyperlipidemia, Hypothyroidism, history of small cell lung cancer, HFpEF, & Pulmonary hypertension. He had been having some increasing edema in his lower extremities and DOR recently.  Also reported productive cough with whitish sputum overt the past month.  Pro BNP 96.3.    ECHO/ LVEF: 03/18/24 EF 55-60%  Clinical Course:  Past Medical History:  Diagnosis Date   Arthritis    Depressive disorder    Diabetes mellitus without complication (HCC)    Fatigue    Headache(784.0)    Hyperlipidemia    Hypertension    Hypothyroidism    Lung cancer (HCC)    Snoring      Social History   Socioeconomic History   Marital status: Widowed    Spouse name: Not on file   Number of children: 1   Years of education: High Schol   Highest education level: Not on file  Occupational History   Occupation: Retired  Tobacco Use   Smoking status: Former   Smokeless tobacco: Never   Tobacco comments:    Quit 2004  Vaping Use   Vaping status: Never Used  Substance and Sexual Activity   Alcohol use: Not Currently    Alcohol/week: 0.0 standard drinks of alcohol    Comment: has quit since 2001   Drug use: No   Sexual activity: Not on  file  Other Topics Concern   Not on file  Social History Narrative   I cup of coffee a day, drinks some Green tea   Social Drivers of Health   Tobacco Use: Medium Risk (05/02/2024)   Patient History    Smoking Tobacco Use: Former    Smokeless Tobacco Use: Never    Passive Exposure: Not on Actuary Strain: Low Risk (12/05/2022)   Overall Financial Resource Strain (CARDIA)    Difficulty of Paying Living Expenses: Not very hard  Food Insecurity: No Food Insecurity (04/29/2024)   Epic    Worried About Radiation Protection Practitioner of Food in the Last Year: Never true    Ran Out of Food in the Last Year: Never true  Transportation Needs: No Transportation Needs (04/29/2024)   Epic    Lack of Transportation (Medical): No    Lack of Transportation (Non-Medical): No  Physical Activity: Inactive (12/05/2022)   Exercise Vital Sign    Days of Exercise per Week: 0 days    Minutes of Exercise per Session: 0 min  Stress: Not on file  Social Connections: Moderately Integrated (04/29/2024)   Social Connection and Isolation Panel    Frequency of Communication with Friends and Family: More than three times a week    Frequency of Social Gatherings with Friends and Family: More than three times a week    Attends Religious Services: More than  4 times per year    Active Member of Clubs or Organizations: Yes    Attends Banker Meetings: More than 4 times per year    Marital Status: Widowed  Depression (PHQ2-9): Not on file  Alcohol Screen: Not on file  Housing: Low Risk (04/29/2024)   Epic    Unable to Pay for Housing in the Last Year: No    Number of Times Moved in the Last Year: 0    Homeless in the Last Year: No  Utilities: Not At Risk (04/29/2024)   Epic    Threatened with loss of utilities: No  Health Literacy: Not on file   Education Assessment and Provision:  Detailed education and instructions provided on heart failure disease management including the following:  Signs and symptoms  of Heart Failure When to call the physician Importance of daily weights Low sodium diet Fluid restriction Medication management Anticipated future follow-up appointments  Patient education given on each of the above topics.  Patient acknowledges understanding via teach back method and acceptance of all instructions.  Education Materials:  Living Better With Heart Failure Booklet, HF zone tool, & Daily Weight Tracker Tool.  Patient has scale at home: Yes Patient has pill box at home: No. He is compliant with medications but prefers to not use a pill box.    High Risk Criteria for Readmission and/or Poor Patient Outcomes: Heart failure hospital admissions (last 6 months): 1  No Show rate: 0% Difficult social situation: None determined at this time. Demonstrates medication adherence: Yes Primary Language: English Literacy level: Reading, Writing & Comprehension  Barriers of Care:   Daily Weights-has not been doing daily weights prior to this hospitalization. Diet & Fluid Restrictions-Patient eats 2 meals a day.  Breakfast at home.  Lunch out at [employer].   Continued Heart Failure Education  Considerations/Referrals:  Referral made to Heart Failure Pharmacist Stewardship: N/A Referral made to Heart Failure CSW/NCM TOC: No Referral made to Heart & Vascular TOC clinic: No. Patient has a scheduled appointment on 05/12/2024 with Sheron Hallmark (Cardiology) for CHF already following this hospital admission.  That would have been the same date he preferred for a CHF Clinic follow-up @ [facility].  Items for Follow-up on DC/TOC: Encourage daily weights Diet & Fluid Restrictions Continued Heart Failure Education  Charmaine Pines, RN, BSN [facility] Heart Failure Navigator Secure Chat Only

## 2024-05-02 NOTE — Discharge Summary (Signed)
 Physician Discharge Summary  SEDRIC GUIA FMW:[id number] DOB: 11-Jul-1945 DOA: 04/29/2024  PCP: Marvine Rush, MD Cardiologist: Dr. Stacia  Admit date: 04/29/2024 Discharge date: 05/02/2024  Admitted From:  Home  Disposition:  Home   Recommendations for Outpatient Follow-up:  Follow up with PCP in 1 weeks Please follow up with cardiology in 2 weeks   Discharge Condition: STABLE   CODE STATUS: FULL DIET: 2 gram sodium heart healthy    Brief Hospitalization Summary: Please see all hospital notes, images, labs for full details of the hospitalization. Admission provider HPI:  79 year old male with hypertension, type II DM, hyperlipidemia, hypothyroidism, history of non-small cell lung cancer, HFpEF, pulmonary hypertension had presented to the echo lab today for an outpatient TEE and during the procedure notably started having shortness of breath and hypoxia.  He had been having some increasing edema in his lower extremities and DOE recently.  He also reported productive cough with whitish sputum over the past month.  He denied having chest pain and shortness of breath.  He was given IV Lasix  20 mg and chest x-ray was abnormal.  Cardiology recommended he be admitted into the hospital for diuresis.  Hospital Course by listed problems addressed  Acute HFpEF -- pt is responding favorably to IV furosemide  -- Cardiologist recommended IV furosemide  80 mg twice daily initially and now transition to oral lasix  40 mg daily with potassium supplement -- Monitor electrolytes closely and potassium supplement as needed -- Fluid restrictions -- Monitor daily weight, intake and output -- Daily Reds vest reading ordered/requested -- TED hose to legs and elevate legs as able -- Sodium restricted diet -- follow up with Dr. Mallipeddi in 2-3 weeks   Acute respiratory failure---RESOLVED  -- secondary to acute heart failure -- he has been weaned to room air oxygen Filed Weights   04/30/24 0430  05/01/24 0320 05/02/24 0332  Weight: 118.8 kg 117.4 kg 117.4 kg    Intake/Output Summary (Last 24 hours) at 05/02/2024 1201 Last data filed at 05/02/2024 9047 Gross per 24 hour  Intake 360 ml  Output 1350 ml  Net -990 ml     Type 2 diabetes mellitus -- Follow-up A1c testing -- Continue SSI coverage and CBG monitoring as ordered CBG (last 3)  Recent Labs    05/02/24 0334 05/02/24 0718 05/02/24 1112  GLUCAP 103* 128* 173*    Hyperlipidemia -- Resumed home medication management   Hypothyroidism -- Resumed home oral levothyroxine    Pulmonary hypertension -- Resumed home medication management with sildenafil   BPH -- Resumed home medications and follow intake and output   Discharge Diagnoses:  Principal Problem:   Acute heart failure with preserved ejection fraction (HFpEF) (HCC) Active Problems:   Malignant neoplasm of upper lobe of right lung (HCC)   Port-A-Cath in place   BPH with obstruction/lower urinary tract symptoms   Hypoxia   Essential hypertension   Hyperlipidemia   Hypothyroidism   DM II (diabetes mellitus, type II), controlled (HCC)   Left renal mass   Thrombus of right atrial appendage   Pulmonary hypertension, unspecified (HCC)   Discharge Instructions: Discharge Instructions     Ambulatory referral to Cardiology   Complete by: As directed       Allergies as of 05/02/2024       Reactions   Ace Inhibitors Swelling, Rash        Medication List     STOP taking these medications    OneTouch Ultra test strip Generic drug: glucose blood  TAKE these medications    acetaminophen  650 MG CR tablet Commonly known as: TYLENOL  Take 1,300 mg by mouth every 8 (eight) hours as needed for pain.   albuterol  108 (90 Base) MCG/ACT inhaler Commonly known as: VENTOLIN  HFA Inhale 2 puffs into the lungs every 6 (six) hours as needed for wheezing or shortness of breath.   alfuzosin  10 MG 24 hr tablet Commonly known as: UROXATRAL  Take 1  tablet (10 mg total) by mouth 2 (two) times daily.   aspirin  EC 81 MG tablet Take 81 mg by mouth daily.   cholecalciferol  25 MCG (1000 UNIT) tablet Commonly known as: VITAMIN D3 Take 1,000 Units by mouth daily.   furosemide  40 MG tablet Commonly known as: Lasix  Take 1 tablet (40 mg total) by mouth daily. Start taking on: May 03, 2024   levothyroxine  125 MCG tablet Commonly known as: SYNTHROID  Take 125 mcg by mouth every morning.   losartan  50 MG tablet Commonly known as: COZAAR  Take 50 mg by mouth daily.   magnesium oxide 400 (240 Mg) MG tablet Commonly known as: MAG-OX Take 400 mg by mouth daily.   metFORMIN 500 MG tablet Commonly known as: GLUCOPHAGE Take 500 mg by mouth 2 (two) times daily with a meal.   potassium chloride  SA 20 MEQ tablet Commonly known as: KLOR-CON  M Take 1 tablet (20 mEq total) by mouth daily. Take with furosemide  (lasix ) Start taking on: May 03, 2024   sildenafil 20 MG tablet Commonly known as: REVATIO Take 20 mg by mouth daily as needed (ED).   simvastatin  10 MG tablet Commonly known as: ZOCOR  Take 10 mg by mouth every evening.        Follow-up Information     Marvine Rush, MD Follow up in 1 week(s).   Specialty: Family Medicine Why: Hospital Follow Up Contact information: [address] [place] [address] [phone]         Stacia Diannah SQUIBB, MD. Schedule an appointment as soon as possible for a visit in 2 week(s).   Specialties: Cardiology, Internal Medicine Why: Hospital Follow Up Contact information: [address] [place] [address] [phone]                Allergies[1] Allergies as of 05/02/2024       Reactions   Ace Inhibitors Swelling, Rash        Medication List     STOP taking these medications    OneTouch Ultra test strip Generic drug: glucose blood       TAKE these medications    acetaminophen  650 MG CR tablet Commonly known as: TYLENOL  Take 1,300 mg by mouth  every 8 (eight) hours as needed for pain.   albuterol  108 (90 Base) MCG/ACT inhaler Commonly known as: VENTOLIN  HFA Inhale 2 puffs into the lungs every 6 (six) hours as needed for wheezing or shortness of breath.   alfuzosin  10 MG 24 hr tablet Commonly known as: UROXATRAL  Take 1 tablet (10 mg total) by mouth 2 (two) times daily.   aspirin  EC 81 MG tablet Take 81 mg by mouth daily.   cholecalciferol  25 MCG (1000 UNIT) tablet Commonly known as: VITAMIN D3 Take 1,000 Units by mouth daily.   furosemide  40 MG tablet Commonly known as: Lasix  Take 1 tablet (40 mg total) by mouth daily. Start taking on: May 03, 2024   levothyroxine  125 MCG tablet Commonly known as: SYNTHROID  Take 125 mcg by mouth every morning.   losartan  50 MG tablet Commonly known  as: COZAAR  Take 50 mg by mouth daily.   magnesium oxide 400 (240 Mg) MG tablet Commonly known as: MAG-OX Take 400 mg by mouth daily.   metFORMIN 500 MG tablet Commonly known as: GLUCOPHAGE Take 500 mg by mouth 2 (two) times daily with a meal.   potassium chloride  SA 20 MEQ tablet Commonly known as: KLOR-CON  M Take 1 tablet (20 mEq total) by mouth daily. Take with furosemide  (lasix ) Start taking on: May 03, 2024   sildenafil 20 MG tablet Commonly known as: REVATIO Take 20 mg by mouth daily as needed (ED).   simvastatin  10 MG tablet Commonly known as: ZOCOR  Take 10 mg by mouth every evening.        Procedures/Studies: DG Chest Port 1 View Result Date: 04/30/2024 EXAM: 1 VIEW(S) XRAY OF THE CHEST 04/30/2024 06:13:10 AM COMPARISON: 04/29/2024 CLINICAL HISTORY: Pulmonary edema. FINDINGS: LINES, TUBES AND DEVICES: Stable right chest port. LUNGS AND PLEURA: Unchanged right apical opacity consistent with post-treatment changes for known lung cancer. Persistent right hemithorax volume loss. No pleural effusion. No pneumothorax. HEART AND MEDIASTINUM: No acute abnormality of the cardiac and mediastinal silhouettes. BONES AND  SOFT TISSUES: No acute osseous abnormality. IMPRESSION: 1. No radiographic evidence of pulmonary edema. 2. Stable post-treatment changes involving the right lung. Electronically signed by: Waddell Calk MD 04/30/2024 06:35 AM EST RP Workstation: [id number]   DG Chest Port 1 View Result Date: 04/29/2024 CLINICAL DATA:  Sarcoidosis of lung. EXAM: PORTABLE CHEST 1 VIEW COMPARISON:  Chest CT 12/07/2023 FINDINGS: Right chest port in place. Volume loss in the right hemithorax with right suprahilar opacity, grossly stable compared to prior CT. Right hilar retraction. No focal left lung opacity. No pneumothorax. No pulmonary edema. The heart is stable in size. On limited assessment, no acute osseous findings. IMPRESSION: Volume loss in the right hemithorax with right suprahilar opacity, grossly stable compared to prior CT. No new or acute findings. Electronically Signed   By: Andrea Gasman M.D.   On: 04/29/2024 11:55   IR Radiologist Eval & Mgmt Result Date: 04/15/2024 CLINICAL DATA:  IR consult. EXAM: IR EVAL AND MANAGEMENT COMPARISON:  None Available. FINDINGS: See dictated note in Epic. IMPRESSION: See dictated note in Epic. Electronically Signed   By: Marcey Moan M.D.   On: 04/15/2024 16:51     Subjective: Pt says he feels well and eager to go home today.  Has diuresed and leg edema is resolved.   Discharge Exam: Vitals:   05/02/24 0332 05/02/24 0806  BP: 119/64 113/67  Pulse: 95 91  Resp:    Temp: 99.2 F (37.3 C) 99.1 F (37.3 C)  SpO2: 95% 95%   Vitals:   05/01/24 1259 05/01/24 2010 05/02/24 0332 05/02/24 0806  BP: 93/67 103/60 119/64 113/67  Pulse: 93 76 95 91  Resp: 20     Temp: 98.2 F (36.8 C) 98.9 F (37.2 C) 99.2 F (37.3 C) 99.1 F (37.3 C)  TempSrc: Oral Oral Oral Oral  SpO2: 95% 98% 95% 95%  Weight:   117.4 kg   Height:       General: Pt is alert, awake, not in acute distress Cardiovascular: normal S1/S2 +, no rubs, no gallops Respiratory: CTA bilaterally, no  wheezing, no rhonchi Abdominal: Soft, NT, ND, bowel sounds + Extremities: no edema, no cyanosis   The results of significant diagnostics from this hospitalization (including imaging, microbiology, ancillary and laboratory) are listed below for reference.     Microbiology: No results found for this or any  previous visit (from the past 240 hours).   Labs: BNP (last 3 results) No results for input(s): BNP in the last 8760 hours. Basic Metabolic Panel: Recent Labs  Lab 04/29/24 1249 04/30/24 0540 05/01/24 0534 05/02/24 0550  NA 141 140 136 135  K 4.1 3.8 3.6 3.2*  CL 102 98 93* 94*  CO2 26 36* 32 33*  GLUCOSE 144* 106* 118* 118*  BUN 16 15 18 23   CREATININE 0.90 0.92 0.94 1.03  CALCIUM 8.9 9.0 9.2 8.5*  MG  --  2.1  --  1.8   Liver Function Tests: No results for input(s): AST, ALT, ALKPHOS, BILITOT, PROT, ALBUMIN in the last 168 hours. No results for input(s): LIPASE, AMYLASE in the last 168 hours. No results for input(s): AMMONIA in the last 168 hours. CBC: Recent Labs  Lab 04/29/24 1249  WBC 5.6  NEUTROABS 4.5  HGB 12.8*  HCT 41.1  MCV 85.4  PLT 188   Cardiac Enzymes: No results for input(s): CKTOTAL, CKMB, CKMBINDEX, TROPONINI in the last 168 hours. BNP: Invalid input(s): POCBNP CBG: Recent Labs  Lab 05/01/24 1620 05/01/24 2053 05/02/24 0334 05/02/24 0718 05/02/24 1112  GLUCAP 145* 151* 103* 128* 173*   D-Dimer No results for input(s): DDIMER in the last 72 hours. Hgb A1c Recent Labs    04/29/24 1249  HGBA1C 6.5*   Lipid Profile No results for input(s): CHOL, HDL, LDLCALC, TRIG, CHOLHDL, LDLDIRECT in the last 72 hours. Thyroid  function studies No results for input(s): TSH, T4TOTAL, T3FREE, THYROIDAB in the last 72 hours.  Invalid input(s): FREET3 Anemia work up No results for input(s): VITAMINB12, FOLATE, FERRITIN, TIBC, IRON, RETICCTPCT in the last 72 hours. Urinalysis     Component Value Date/Time   COLORURINE AMBER (A) 01/13/2022 0728   APPEARANCEUR Clear 04/06/2024 1457   LABSPEC 1.020 01/13/2022 0728   PHURINE 6.0 01/13/2022 0728   GLUCOSEU Negative 04/06/2024 1457   HGBUR MODERATE (A) 01/13/2022 0728   BILIRUBINUR Negative 04/06/2024 1457   KETONESUR 5 (A) 01/13/2022 0728   PROTEINUR Negative 04/06/2024 1457   PROTEINUR 100 (A) 01/13/2022 0728   NITRITE Negative 04/06/2024 1457   NITRITE NEGATIVE 01/13/2022 0728   LEUKOCYTESUR Negative 04/06/2024 1457   LEUKOCYTESUR NEGATIVE 01/13/2022 0728   Sepsis Labs Recent Labs  Lab 04/29/24 1249  WBC 5.6   Microbiology No results found for this or any previous visit (from the past 240 hours).  Time coordinating discharge:  44 mins  SIGNED:  Afton Louder, MD  [facility] 05/02/2024, 12:01 PM How to contact the [facility] Attending or Consulting provider 7A - 7P or covering provider during after hours 7P -7A, for this patient?  Check the care team in [facility] and look for a) attending/consulting [facility] provider listed and b) the [facility] team listed Log into [email or web address] and use [facility]'s universal password to access. If you do not have the password, please contact the hospital operator. Locate the TR[facility] provider you are looking for under [facility] and page to a number that you can be directly reached. If you still have difficulty reaching the provider, please page the [facility] (Director on Call) for the Hospitalists listed on amion for assistance.     [1]  Allergies Allergen Reactions   Ace Inhibitors Swelling and Rash

## 2024-05-02 NOTE — Progress Notes (Addendum)
 Nurse at bedside,patient alert and oriented times four.No c/o pain or discomfort noted. Patient's potassium level was 3.2 this am, Dr Afton Louder notified. Plan of care on going.

## 2024-05-02 NOTE — Progress Notes (Signed)
" ° °  Cardiologist:  Mallipeddi  Subjective:  Denies SSCP, palpitations or Dyspnea Breathing improved   Objective:  Vitals:   05/01/24 1259 05/01/24 2010 05/02/24 0332 05/02/24 0806  BP: 93/67 103/60 119/64 113/67  Pulse: 93 76 95 91  Resp: 20     Temp: 98.2 F (36.8 C) 98.9 F (37.2 C) 99.2 F (37.3 C) 99.1 F (37.3 C)  TempSrc: Oral Oral Oral Oral  SpO2: 95% 98% 95% 95%  Weight:   117.4 kg   Height:        Intake/Output from previous day:  Intake/Output Summary (Last 24 hours) at 05/02/2024 0856 Last data filed at 05/02/2024 9667 Gross per 24 hour  Intake 360 ml  Output 950 ml  Net -590 ml    Physical Exam:  Affect appropriate Chronically ill overweight male  HEENT: normal Neck supple with no adenopathy JVP normal no bruits no thyromegaly Lungs clear with no wheezing and good diaphragmatic motion Heart:  S1/S2 no murmur, no rub, gallop or click PMI normal  Port under right clavicle  Abdomen: benighn, BS positve, no tenderness, no AAA no bruit.  No HSM or HJR Distal pulses intact with no bruits No edema Neuro non-focal Skin warm and dry No muscular weakness   Lab Results: Basic Metabolic Panel: Recent Labs    04/30/24 0540 05/01/24 0534 05/02/24 0550  NA 140 136 135  K 3.8 3.6 3.2*  CL 98 93* 94*  CO2 36* 32 33*  GLUCOSE 106* 118* 118*  BUN 15 18 23   CREATININE 0.92 0.94 1.03  CALCIUM 9.0 9.2 8.5*  MG 2.1  --  1.8    CBC: Recent Labs    04/29/24 1249  WBC 5.6  NEUTROABS 4.5  HGB 12.8*  HCT 41.1  MCV 85.4  PLT 188   Hemoglobin A1C: Recent Labs    04/29/24 1249  HGBA1C 6.5*    Imaging: No results found.  Cardiac Studies:  ECG: NSR rate 93 normal    Telemetry:  NSR   Echo: EF 55-60% normal RV no MR/AR or valve dx  Medications:    alfuzosin   10 mg Oral BID   aspirin  EC  81 mg Oral Daily   cholecalciferol   1,000 Units Oral Daily   enoxaparin  (LOVENOX ) injection  60 mg Subcutaneous Q24H   furosemide   80 mg Intravenous Q12H    insulin  aspart  0-15 Units Subcutaneous TID WC   insulin  aspart  3 Units Subcutaneous TID WC   levothyroxine   125 mcg Oral Q0600   losartan   50 mg Oral Daily   potassium chloride   40 mEq Oral Daily   simvastatin   10 mg Oral QPM   sodium chloride  flush  3 mL Intravenous Q12H      Assessment/Plan:   Hypoxemia:  occurred during TEE. TEE done when cardiac CTA 11/29 read ? ? RAA clot. I reviewed exam and this is just mixing artifact which is most common at the SVC/RA junction. No clot on port a cath lead on calcium score non contrast images. No need to re attempt TEE  EF is normal with no valve disease History of lung cancer ok to change to lasix  40 mg daily and have outpatient f/u with Dr Stacia HTN:  continue losartan  and diuretic HLD on simvastatin   CAD:  mild by cardiac CTA 81 mg ASA and statin Calcium score 168 CAD RADS 2 non obstructive dx   Billy Hall 05/02/2024, 8:56 AM    "

## 2024-05-02 NOTE — Progress Notes (Signed)
 Transition of Care Department [facility]) has reviewed patient and no other TOC needs have been identified at this time. We will continue to monitor patient advancement through interdisciplinary progression rounds. If new patient transition needs arise, please place a TOC consult.   05/02/24 0911  TOC Brief Assessment  Insurance and Status Reviewed  Patient has primary care physician Yes  Home environment has been reviewed Lives alone.  Prior level of function: Independent.  Prior/Current Home Services No current home services  Social Drivers of Health Review SDOH reviewed no interventions necessary  Readmission risk has been reviewed Yes  Transition of care needs no transition of care needs at this time

## 2024-05-02 NOTE — Plan of Care (Signed)

## 2024-05-02 NOTE — Discharge Instructions (Signed)
 IMPORTANT INFORMATION: PAY CLOSE ATTENTION  ? ?PHYSICIAN DISCHARGE INSTRUCTIONS ? ?Follow with Primary care provider  Assunta Found, MD  and other consultants as instructed by your Hospitalist Physician ? ?SEEK MEDICAL CARE OR RETURN TO EMERGENCY ROOM IF SYMPTOMS COME BACK, WORSEN OR NEW PROBLEM DEVELOPS  ? ?Please note: ?You were cared for by a hospitalist during your hospital stay. Every effort will be made to forward records to your primary care provider.  You can request that your primary care provider send for your hospital records if they have not received them.  Once you are discharged, your primary care physician will handle any further medical issues. Please note that NO REFILLS for any discharge medications will be authorized once you are discharged, as it is imperative that you return to your primary care physician (or establish a relationship with a primary care physician if you do not have one) for your post hospital discharge needs so that they can reassess your need for medications and monitor your lab values. ? ?Please get a complete blood count and chemistry panel checked by your Primary MD at your next visit, and again as instructed by your Primary MD. ? ?Get Medicines reviewed and adjusted: ?Please take all your medications with you for your next visit with your Primary MD ? ?Laboratory/radiological data: ?Please request your Primary MD to go over all hospital tests and procedure/radiological results at the follow up, please ask your primary care provider to get all Hospital records sent to his/her office. ? ?In some cases, they will be blood work, cultures and biopsy results pending at the time of your discharge. Please request that your primary care provider follow up on these results. ? ?If you are diabetic, please bring your blood sugar readings with you to your follow up appointment with primary care.   ? ?Please call and make your follow up appointments as soon as possible.   ? ?Also Note  the following: ?If you experience worsening of your admission symptoms, develop shortness of breath, life threatening emergency, suicidal or homicidal thoughts you must seek medical attention immediately by calling 911 or calling your MD immediately  if symptoms less severe. ? ?You must read complete instructions/literature along with all the possible adverse reactions/side effects for all the Medicines you take and that have been prescribed to you. Take any new Medicines after you have completely understood and accpet all the possible adverse reactions/side effects.  ? ?Do not drive when taking Pain medications or sleeping medications (Benzodiazepines) ? ?Do not take more than prescribed Pain, Sleep and Anxiety Medications. It is not advisable to combine anxiety,sleep and pain medications without talking with your primary care practitioner ? ?Special Instructions: If you have smoked or chewed Tobacco  in the last 2 yrs please stop smoking, stop any regular Alcohol  and or any Recreational drug use. ? ?Wear Seat belts while driving.  Do not drive if taking any narcotic, mind altering or controlled substances or recreational drugs or alcohol.  ? ? ? ? ? ?

## 2024-05-02 NOTE — Care Management Important Message (Addendum)
 Important Message  Patient Details  Name: Billy Hall MRN: [id number] Date of Birth: 1946-02-16   Important Message Given: Yes       Sheilla Maris L Anastazia [place] 05/02/2024, 12:36 PM

## 2024-05-02 NOTE — Progress Notes (Signed)
 Patient discharged home with instructions given on medications and follow up visits,verbalized understanding. Prescriptions sent to Pharmacy of choice documented on AVS. Iv discontinued ,catheter intact.Staff to accompany patient to an awaiting vehicle.

## 2024-05-02 NOTE — TOC Progression Note (Signed)
 Transition of Care [facility]) - Progression Note    Patient Details  Name: Billy Hall MRN: [id number] Date of Birth: [birth year]  Transition of [facility]) CM/SW Contact  [place] Saddie Kim, [place] Phone Number: 05/02/2024, 9:07 AM  Clinical Narrative: TOC received consult for CHF screening. Pt reports this is a new diagnosis for him. LCSW reviewed recommendations for daily weights, following heart healthy diet, and taking medications as prescribed. Pt indicates he has a scale at home. Will add CHF education to AVS.                         Expected Discharge Plan and Services                                               Social Drivers of Health (SDOH) Interventions SDOH Screenings   Food Insecurity: No Food Insecurity (04/29/2024)  Housing: Low Risk (04/29/2024)  Transportation Needs: No Transportation Needs (04/29/2024)  Utilities: Not At Risk (04/29/2024)  Financial Resource Strain: Low Risk (12/05/2022)  Physical Activity: Inactive (12/05/2022)  Social Connections: Moderately Integrated (04/29/2024)  Tobacco Use: Medium Risk (04/29/2024)    Readmission Risk Interventions     No data to display

## 2024-05-03 ENCOUNTER — Ambulatory Visit (HOSPITAL_COMMUNITY)

## 2024-05-08 NOTE — Progress Notes (Unsigned)
 " Cardiology Office Note:  .   Date:  05/12/2024  ID:  Billy Hall, DOB 1945-07-31, MRN [id number] PCP: Marvine Rush, MD  [facility] Providers Cardiologist:  Diannah SHAUNNA Maywood, MD {   History of Present Illness: .   Billy Hall is a 79 y.o. male  with PMHx of lung cancer (s/p chemotherapy and radiation; Follows with oncology at [facility]), CAD, HLD, HTN  who reports to [facility] office for hospital follow up.   Pertinent cardiac medical history:  CT chest with contrast 11/2022: hypodensity in the right atrial appendage, thrombus versus mixing artifact. This also showed enlarged pulmonary artery, indicative of pulmonary hypertension.  Echo 03/18/2024: EF 55 to 60%, normal LV/RV function, abnormal global longitudinal strain at -10.6% CCTA 03/22/2024: CAS is 168, 57th percentile. Mild nonobstructive CAD is present. Main pulmonary artery is dilated (40 mm), suggestive of increased pulmonary pressures. Ascending aortic dilation, 40.49mm at the level of the PA bifurcation. No obvious thrombus within RA but limited accuracy, therefore recommended TEE or cardiac MRI. Attempted TEE (04/2024): Aborted due to hypoxia; Per Dr. Sydney, CTA review favored mixing artifact at the SVC/RA junction; no reattempt recommended.  He was initially referred to cardiology after an abnormal CT chest 11/2022 raised concern for a possible right atrial appendage thrombus and findings suggestive of pulmonary hypertension. He has a history of chronic, stable shortness of breath dating back to a COVID infection in 2020 and lung cancer diagnosis, without clear progression. His clinical course has since included further cardiac imaging as above.   Hospitalized 1/2 - 08/2024 after developing SOB and hypoxia during TEE procedure.  Reported increased LE edema and DOE recently and productive cough with whitish sputum.  Cardiology admitted into hospital for diuresis.  Transitioned to Lasix  40 mg daily with KCl 20  mEq daily.  Discharge weight of 257 pounds.  Per Dr. Delford, reviewed CTA with suspected RAA clot and notes it is just mixing artifact which is most common at the SVC/RA junction.  Recommended no need to reattempt TEE.  Continued on ASA 81 mg, Lasix  40 mg daily, losartan  50 mg daily, KCl 20 mEq daily, simvastatin  10 mg daily.  Today, accompanied by wife. He reports overall doing well since hospitalization. Home weights have been stable around 262 lbs. He reports baseline shortness of breath with longer distances but is able to climb steps in his home without difficulty. He denies chest pain, palpitations, syncope, presyncope, dizziness, orthopnea, PND, swelling, significant weight changes, acute bleeding, or claudication. He reports medication compliance and adherence to a low-sodium diet. He quit smoking in 2001 after a 40-year history and denies alcohol or illicit drug use.   ROS: 10 point review of system has been reviewed and considered negative except ones been listed in the HPI.   Studies Reviewed: SABRA   ECHO IMPRESSIONS 03/18/2024  1. Technically difficult study.   2. Left ventricular ejection fraction, by estimation, is 55 to 60%. Left  ventricular ejection fraction by 3D volume is 56 %. The left ventricle has  normal function. The left ventricle has no regional wall motion  abnormalities. Left ventricular diastolic   parameters were normal. The average left ventricular global longitudinal  strain is -10.6 %. The global longitudinal strain is abnormal.   3. Right ventricular systolic function is normal. The right ventricular  size is normal.   4. The mitral valve is normal in structure. No evidence of mitral valve  regurgitation. No evidence of mitral stenosis.   5.  The aortic valve is tricuspid. Aortic valve regurgitation is not  visualized. No aortic stenosis is present.   6. The inferior vena cava is normal in size with greater than 50%  respiratory variability, suggesting right atrial  pressure of 3 mmHg.   Comparison(s): No prior Echocardiogram. 12/07/2023 CT chest Hypodensity in the right atrial appendage may reflect mixing  artifact or thrombus.    CCTA IMPRESSION 03/22/2024 1. Coronary calcium score of 168. This was 57th percentile for age and sex matched control. 2. Normal coronary origins with left dominance. 3. CAD-RADS 2 Mild non-obstructive CAD. 4. Main pulmonary artery is dilated (40 mm), suggestive of increased pulmonary pressures. Evaluate for pulmonary hypertension. 5. Ascending aortic dilation, 40.51mm at the level of the PA bifurcation measured double oblique. Consider secondary imaging modality (echocardiogram, CTA Aorta Protocol, MRA Aorta Protocol) in 12 months. 6. Catheter (Port-A-Cath per EMR) tip near the superior cavoatrial junction. 7. No obvious thrombus noted within the right atrium but accuracy is limited due to timing of the contrast bolus and mixing artifact being present. Recommend transesophageal echocardiogram or cardiac MRI clinical correlation required.   RECOMMENDATION: Consider non-atherosclerotic causes of chest pain. Consider preventive therapy and risk factor modification. CV Studies: Cardiac studies reviewed are outlined and summarized above. Otherwise please see EMR for full report.   Risk Assessment/Calculations:     STOP-Bang Score:  4  { Consider Dx Sleep Disordered Breathing or Sleep Apnea  ICD G47.33   Physical Exam:   VS:  BP 118/72   Pulse 81   Ht 5' 9 (1.753 m)   Wt 267 lb (121.1 kg)   SpO2 95%   BMI 39.43 kg/m    Wt Readings from Last 3 Encounters:  05/12/24 267 lb (121.1 kg)  05/02/24 258 lb 13.1 oz (117.4 kg)  04/29/24 270 lb 15.1 oz (122.9 kg)    GEN: Well nourished, well developed in no acute distress while sitting in chair. Accompanied by wife.  NECK: No JVD; No carotid bruits CARDIAC: RRR, no murmurs, rubs, gallops RESPIRATORY:  + wheezing on the left side of lung; Clear to auscultation without  rales or rhonchi  ABDOMEN: Soft, non-tender, non-distended EXTREMITIES:  No edema; No deformity   ASSESSMENT AND PLAN: .    Right atrial thrombus Suspected thrombus on prior CT felt to represent mixing artifact per CTA review. No indication to reattempt TEE at this time. No anticoagulation indicated from a cardiology standpoint.   Pulmonary hypertension, unspecified (HCC) Imaging with dilated pulmonary artery suggestive of elevated pulmonary pressures. Likely secondary to underlying lung disease and malignancy (Group 3) Previously tested for sleep apnea; denies diagnosis and not interested in repeat sleep study. V/Q scan discussed to rule out chronic thromboembolic disease; patient prefers to defer further testing at this time to focus on lung cancer treatment. Discussed risks of untreated pulmonary embolic disease and increased thrombotic risk in the setting of malignancy; patient verbalized understanding. Encouraged to contact our office if symptoms worsen.  Hypokalemia Potassium 3.2 on 05/02/2024. Likely diuretic-related. Order BMP and magnesium today. Reviewed Follow up labs WNL .  Continue  KCL 20 MEQ and mag-ox 400 mg daily; adjust based on lab results.  Volume overload Preserved EF and normal heart function via ECHO 02/2024.  Clinically euvolemic today. Weight 271 in 04/14/2024 to 267 lbs today.  Continue furosemide  40 mg daily with K & Mg supplement as above.  Reinforced low-sodium diet and monitoring for signs of fluid retention.   Coronary artery disease involving native coronary  artery of native heart without angina pectoris Hyperlipidemia LDL goal <70 Mild non-obstructive CAD on CCTA 02/2024.  Denies angina symptoms. No need for further ischemic evaluation at this time.  LDL of 61 in 03/2024. Continue ASA 81 mg, Zocor  10 mg daily.    Primary hypertension BP this OV well controlled today: 118/72 Continue on Losartan  50 mg daily.  Encourage physical activity for 150  minutes per week and heart healthy low sodium diet. Discussed limiting sodium intake to < 2 grams daily.    Lung cancer  Baseline exertional dyspnea likely related to underlying lung disease; no interval worsening.  Dispo: Follow up in 3 months with [facility] or [facility].   Signed, Lorette CINDERELLA Kapur, PA-C  "

## 2024-05-11 ENCOUNTER — Ambulatory Visit: Admitting: Physician Assistant

## 2024-05-12 ENCOUNTER — Ambulatory Visit: Payer: Self-pay | Admitting: Physician Assistant

## 2024-05-12 ENCOUNTER — Other Ambulatory Visit (HOSPITAL_COMMUNITY)
Admission: RE | Admit: 2024-05-12 | Discharge: 2024-05-12 | Disposition: A | Discharge status: Home / Self Care | Source: Ambulatory Visit | Attending: Physician Assistant | Admitting: Physician Assistant

## 2024-05-12 ENCOUNTER — Encounter: Payer: Self-pay | Admitting: Physician Assistant

## 2024-05-12 ENCOUNTER — Ambulatory Visit: Attending: Physician Assistant | Admitting: Physician Assistant

## 2024-05-12 VITALS — BP 118/72 | HR 81 | Ht 69.0 in | Wt 267.0 lb

## 2024-05-12 DIAGNOSIS — E8779 Other fluid overload: Secondary | ICD-10-CM | POA: Diagnosis not present

## 2024-05-12 DIAGNOSIS — I272 Pulmonary hypertension, unspecified: Secondary | ICD-10-CM | POA: Diagnosis not present

## 2024-05-12 DIAGNOSIS — C3411 Malignant neoplasm of upper lobe, right bronchus or lung: Secondary | ICD-10-CM

## 2024-05-12 DIAGNOSIS — I10 Essential (primary) hypertension: Secondary | ICD-10-CM | POA: Diagnosis not present

## 2024-05-12 DIAGNOSIS — I513 Intracardiac thrombosis, not elsewhere classified: Secondary | ICD-10-CM

## 2024-05-12 DIAGNOSIS — I2510 Atherosclerotic heart disease of native coronary artery without angina pectoris: Secondary | ICD-10-CM | POA: Diagnosis not present

## 2024-05-12 DIAGNOSIS — E876 Hypokalemia: Secondary | ICD-10-CM | POA: Insufficient documentation

## 2024-05-12 DIAGNOSIS — E785 Hyperlipidemia, unspecified: Secondary | ICD-10-CM

## 2024-05-12 LAB — BASIC METABOLIC PANEL WITH GFR
Anion gap: 10 (ref 5–15)
BUN: 16 mg/dL (ref 8–23)
CO2: 27 mmol/L (ref 22–32)
Calcium: 8.8 mg/dL — ABNORMAL LOW (ref 8.9–10.3)
Chloride: 103 mmol/L (ref 98–111)
Creatinine, Ser: 0.81 mg/dL (ref 0.61–1.24)
GFR, Estimated: >60 mL/min
Glucose, Bld: 121 mg/dL — ABNORMAL HIGH (ref 70–99)
Potassium: 4.1 mmol/L (ref 3.5–5.1)
Sodium: 139 mmol/L (ref 135–145)

## 2024-05-12 LAB — MAGNESIUM: Magnesium: 2.2 mg/dL (ref 1.7–2.4)

## 2024-05-12 NOTE — Patient Instructions (Signed)
 Medication Instructions:  Your physician recommends that you continue on your current medications as directed. Please refer to the Current Medication list given to you today.  *If you need a refill on your cardiac medications before your next appointment, please call your pharmacy*  Lab Work: BMET MAG  If you have labs (blood work) drawn today and your tests are completely normal, you will receive your results only by: MyChart Message (if you have MyChart) OR A paper copy in the mail If you have any lab test that is abnormal or we need to change your treatment, we will call you to review the results.  Testing/Procedures: None  Follow-Up: At [facility], you and your health needs are our priority.  As part of our continuing mission to provide you with exceptional heart care, our providers are all part of one team.  This team includes your primary Cardiologist (physician) and Advanced Practice Providers or APPs (Physician Assistants and Nurse Practitioners) who all work together to provide you with the care you need, when you need it.  Your next appointment:   3 month(s)  Provider:   You may see Vishnu P Mallipeddi, MD or one of the following Advanced Practice Providers on your designated Care Team:   Brittany Strader, PA-C  [place], [place] Olivia [place], [place]     We recommend signing up for the patient portal called MyChart.  Sign up information is provided on this After Visit Summary.  MyChart is used to connect with patients for Virtual Visits (Telemedicine).  Patients are able to view lab/test results, encounter notes, upcoming appointments, etc.  Non-urgent messages can be sent to your provider as well.   To learn more about what you can do with MyChart, go to [email or web address].   Other Instructions Thank you for choosing [facility]!

## 2024-05-16 ENCOUNTER — Other Ambulatory Visit (HOSPITAL_COMMUNITY)

## 2024-05-20 ENCOUNTER — Encounter: Payer: Self-pay | Admitting: Internal Medicine

## 2024-05-20 ENCOUNTER — Ambulatory Visit
Admission: RE | Admit: 2024-05-20 | Discharge: 2024-05-20 | Disposition: A | Source: Ambulatory Visit | Attending: Surgery

## 2024-05-20 DIAGNOSIS — D490 Neoplasm of unspecified behavior of digestive system: Secondary | ICD-10-CM

## 2024-05-20 MED ORDER — GADOPICLENOL 0.5 MMOL/ML IV SOLN
10.0000 mL | Freq: Once | INTRAVENOUS | Status: AC | PRN
  Administered 2024-05-20: 10 mL via INTRAVENOUS

## 2024-05-26 ENCOUNTER — Other Ambulatory Visit: Payer: Self-pay

## 2024-06-07 ENCOUNTER — Inpatient Hospital Stay: Attending: Internal Medicine

## 2024-06-07 ENCOUNTER — Ambulatory Visit (HOSPITAL_COMMUNITY)

## 2024-06-14 ENCOUNTER — Inpatient Hospital Stay: Admitting: Internal Medicine

## 2024-07-13 ENCOUNTER — Ambulatory Visit: Admitting: Physician Assistant

## 2024-07-20 ENCOUNTER — Ambulatory Visit: Admitting: Urology

## 2024-08-10 ENCOUNTER — Ambulatory Visit: Admitting: Internal Medicine

## 2024-10-12 ENCOUNTER — Ambulatory Visit: Admitting: Urology
